# Patient Record
Sex: Female | Born: 1950 | Race: White | Hispanic: No | Smoking: Former smoker
Health system: Southern US, Community
[De-identification: ages and names within clinical notes are randomized; demographics above are authoritative.]

## PROBLEM LIST (undated history)

## (undated) DIAGNOSIS — I10 Essential (primary) hypertension: Secondary | ICD-10-CM

## (undated) DIAGNOSIS — I639 Cerebral infarction, unspecified: Secondary | ICD-10-CM

## (undated) DIAGNOSIS — E119 Type 2 diabetes mellitus without complications: Secondary | ICD-10-CM

## (undated) DIAGNOSIS — M545 Low back pain, unspecified: Secondary | ICD-10-CM

## (undated) DIAGNOSIS — E039 Hypothyroidism, unspecified: Secondary | ICD-10-CM

## (undated) DIAGNOSIS — M7070 Other bursitis of hip, unspecified hip: Secondary | ICD-10-CM

## (undated) DIAGNOSIS — E559 Vitamin D deficiency, unspecified: Secondary | ICD-10-CM

## (undated) DIAGNOSIS — E785 Hyperlipidemia, unspecified: Secondary | ICD-10-CM

## (undated) HISTORY — DX: Other bursitis of hip, unspecified hip: M70.70

## (undated) HISTORY — DX: Hypothyroidism, unspecified: E03.9

## (undated) HISTORY — DX: Vitamin D deficiency, unspecified: E55.9

## (undated) HISTORY — DX: Low back pain, unspecified: M54.50

## (undated) HISTORY — PX: CHOLECYSTECTOMY: SHX55

## (undated) HISTORY — DX: Essential (primary) hypertension: I10

## (undated) HISTORY — PX: LEG SURGERY: SHX1003

## (undated) HISTORY — DX: Type 2 diabetes mellitus without complications: E11.9

## (undated) HISTORY — DX: Cerebral infarction, unspecified: I63.9

## (undated) HISTORY — DX: Hyperlipidemia, unspecified: E78.5

## (undated) HISTORY — PX: APPENDECTOMY: SHX54

## (undated) HISTORY — DX: Low back pain: M54.5

---

## 1994-03-13 HISTORY — PX: BACK SURGERY: SHX140

## 2005-03-30 ENCOUNTER — Ambulatory Visit: Payer: Self-pay | Admitting: Family Medicine

## 2011-03-08 ENCOUNTER — Ambulatory Visit: Payer: Self-pay | Admitting: Physical Therapy

## 2011-03-08 ENCOUNTER — Ambulatory Visit: Payer: BC Managed Care – PPO | Attending: Family Medicine | Admitting: Physical Therapy

## 2011-03-08 DIAGNOSIS — M546 Pain in thoracic spine: Secondary | ICD-10-CM | POA: Insufficient documentation

## 2011-03-08 DIAGNOSIS — R293 Abnormal posture: Secondary | ICD-10-CM | POA: Insufficient documentation

## 2011-03-08 DIAGNOSIS — M545 Low back pain, unspecified: Secondary | ICD-10-CM | POA: Insufficient documentation

## 2011-03-08 DIAGNOSIS — M2560 Stiffness of unspecified joint, not elsewhere classified: Secondary | ICD-10-CM | POA: Insufficient documentation

## 2011-03-08 DIAGNOSIS — IMO0001 Reserved for inherently not codable concepts without codable children: Secondary | ICD-10-CM | POA: Insufficient documentation

## 2011-03-09 ENCOUNTER — Ambulatory Visit: Payer: BC Managed Care – PPO | Admitting: Physical Therapy

## 2011-03-13 ENCOUNTER — Ambulatory Visit: Payer: BC Managed Care – PPO | Admitting: Physical Therapy

## 2011-04-12 DIAGNOSIS — Z029 Encounter for administrative examinations, unspecified: Secondary | ICD-10-CM

## 2011-08-29 ENCOUNTER — Encounter: Payer: Self-pay | Admitting: Internal Medicine

## 2012-05-20 ENCOUNTER — Encounter: Payer: Self-pay | Admitting: Family Medicine

## 2012-05-29 ENCOUNTER — Telehealth: Payer: Self-pay | Admitting: Pharmacist

## 2012-05-29 ENCOUNTER — Encounter: Payer: Self-pay | Admitting: Family Medicine

## 2012-05-29 ENCOUNTER — Ambulatory Visit (INDEPENDENT_AMBULATORY_CARE_PROVIDER_SITE_OTHER): Payer: BC Managed Care – PPO | Admitting: Family Medicine

## 2012-05-29 VITALS — BP 153/78 | HR 79 | Temp 98.0°F | Ht 66.0 in | Wt 214.6 lb

## 2012-05-29 DIAGNOSIS — E119 Type 2 diabetes mellitus without complications: Secondary | ICD-10-CM

## 2012-05-29 DIAGNOSIS — F32A Depression, unspecified: Secondary | ICD-10-CM

## 2012-05-29 DIAGNOSIS — E559 Vitamin D deficiency, unspecified: Secondary | ICD-10-CM

## 2012-05-29 DIAGNOSIS — M25512 Pain in left shoulder: Secondary | ICD-10-CM

## 2012-05-29 DIAGNOSIS — F3289 Other specified depressive episodes: Secondary | ICD-10-CM

## 2012-05-29 DIAGNOSIS — E785 Hyperlipidemia, unspecified: Secondary | ICD-10-CM

## 2012-05-29 DIAGNOSIS — M25519 Pain in unspecified shoulder: Secondary | ICD-10-CM

## 2012-05-29 DIAGNOSIS — R21 Rash and other nonspecific skin eruption: Secondary | ICD-10-CM

## 2012-05-29 DIAGNOSIS — I10 Essential (primary) hypertension: Secondary | ICD-10-CM

## 2012-05-29 DIAGNOSIS — F329 Major depressive disorder, single episode, unspecified: Secondary | ICD-10-CM

## 2012-05-29 LAB — BASIC METABOLIC PANEL
BUN: 14 mg/dL (ref 6–23)
CO2: 27 mEq/L (ref 19–32)
Calcium: 10.4 mg/dL (ref 8.4–10.5)
Chloride: 100 mEq/L (ref 96–112)
Creat: 0.79 mg/dL (ref 0.50–1.10)
Glucose, Bld: 104 mg/dL — ABNORMAL HIGH (ref 70–99)
Potassium: 4.2 mEq/L (ref 3.5–5.3)
Sodium: 139 mEq/L (ref 135–145)

## 2012-05-29 MED ORDER — GLUCOSE BLOOD VI STRP: ORAL_STRIP | Status: DC

## 2012-05-29 MED ORDER — BLOOD GLUCOSE MONITORING SUPPL DEVI: Status: DC

## 2012-05-29 MED ORDER — MUPIROCIN 2 % EX OINT: TOPICAL_OINTMENT | Freq: Three times a day (TID) | CUTANEOUS | Status: AC

## 2012-05-29 MED ORDER — LOSARTAN POTASSIUM-HCTZ 100-25 MG PO TABS: 1.0000 | ORAL_TABLET | Freq: Every day | ORAL | Status: DC

## 2012-05-29 MED ORDER — PRAVASTATIN SODIUM 20 MG PO TABS: 20.0000 mg | ORAL_TABLET | Freq: Every day | ORAL | Status: DC

## 2012-05-29 MED ORDER — CITALOPRAM HYDROBROMIDE 20 MG PO TABS: 20.0000 mg | ORAL_TABLET | Freq: Every day | ORAL | Status: DC

## 2012-05-29 MED ORDER — AMLODIPINE BESYLATE 2.5 MG PO TABS: 2.5000 mg | ORAL_TABLET | Freq: Every day | ORAL | Status: DC

## 2012-05-29 NOTE — Telephone Encounter (Signed)
rx written and given to patient for one touch ultra test strips.  Documented in medications

## 2012-05-29 NOTE — Progress Notes (Signed)
Subjective:     Patient ID: Laura Dalton, female   DOB: [birth year], 62 y.o.   MRN: [id number]  HPI Patient comes in for follow up of her medical problems. Diabetes: Her blood sugars are now running 120s in the morning she has not had an appointment yet with the pharmacist for diabetic teaching. No problems with the metformin which was prescribed for her. No nocturia no polyuria no blurred vision.  Hypertension: No headache chest pain or palpitations. I change her prescription to combine losartan HCTZ.  Left shoulder pain: Her left shoulder continues to hurt her at the left a.c. joint area she cannot reach back and scratch her back. She doesn't think they've meloxicam is very effective. She has chronic back pain. No new weakness or numbness in the left upper extremity.  Because of her health issues and now recognizing that she is diabetic. She is experiencing symptoms of depression low energy. She does loss of motivation to do anything. She knows she was on Cymbalta before and that helped her with pain and her depression. However the cost of the Cymbalta was so high. She has not refilled the Cymbalta true prefer to try a different generic antidepressant.  Hyperlipidemia: She was always on pravastatin 10 mg daily. This was not known to [place] her last visit. She had not picked up her prescription for atorvastatin 20 mg. Nightly rather stay with her pravastatin and just increase the dose instead.  She does have some bumps in the left eyebrow. And also some bumps on the nose. She does not think it is lupus but she does have a close family member who has lupus. These bumps have been recent and she has applied hydrocortisone over-the-counter ointment to it and it has not changed the rash. Past Medical History  Diagnosis Date  . Bursitis of hip   . Low back pain     benign turmor back  . COPD (chronic obstructive pulmonary disease)   . Hypothyroidism   . Vitamin D deficiency disease   . Hypertension    . Diabetes mellitus without complication     type II   Past Surgical History  Procedure Laterality Date  . Back surgery  1996    turmor on back   . Appendectomy    . Cholecystectomy    . Leg surgery Right     "clot and had drain in leg  rt lower leg   History   Social History  . Marital Status: Married    Spouse Name: N/A    Number of Children: N/A  . Years of Education: N/A   Occupational History  . 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 Smoking status: Former Smoker    Types: Cigarettes    Quit date: 12/01/2007  . Smokeless tobacco: Not on file  . Alcohol Use: No  . Drug Use: No  . Sexually Active: Not on file   Other Topics Concern  . Not on file   Social History Narrative  . No narrative on file   Family History  Problem Relation Age of Onset  . Diabetes Mother   . Cancer Father    Current Outpatient Prescriptions on File Prior to Visit  Medication Sig Dispense Refill  . amitriptyline (ELAVIL) 25 MG tablet Take 100 mg by mouth at bedtime.      . fish oil-omega-3 fatty acids 1000 MG capsule Take 2 g by mouth daily.      Marland Kitchen  Fluticasone-Salmeterol (ADVAIR DISKUS) 250-50 MCG/DOSE AEPB Inhale 1 puff into the lungs once.      . hydrochlorothiazide (HYDRODIURIL) 25 MG tablet Take 12.5 mg by mouth daily.      Marland Kitchen losartan (COZAAR) 100 MG tablet Take 100 mg by mouth daily.      . meloxicam (MOBIC) 15 MG tablet Take 15 mg by mouth daily.      . metFORMIN (GLUCOPHAGE) 500 MG tablet Take 500 mg by mouth 2 (two) times daily with a meal.      . pravastatin (PRAVACHOL) 10 MG tablet Take 10 mg by mouth daily.      Marland Kitchen gabapentin (NEURONTIN) 600 MG tablet Take 600 mg by mouth 3 (three) times daily.      . hydrOXYzine (ATARAX/VISTARIL) 25 MG tablet Take 25 mg by mouth 3 (three) times daily as needed.      Marland Kitchen LORazepam (ATIVAN) 0.5 MG tablet Take 0.5 mg by mouth every 8 (eight) hours.       No current facility-administered medications on file prior to visit.    Allergies  Allergen Reactions  . Aspirin Hives   Immunization History  Administered Date(s) Administered  . Influenza Whole 03/13/2010  . Tdap 03/13/2010   Prior to Admission medications   Medication Sig Start Date End Date Taking? Authorizing Provider  amitriptyline (ELAVIL) 25 MG tablet Take 100 mg by mouth at bedtime.   Yes Historical Provider, MD  fish oil-omega-3 fatty acids 1000 MG capsule Take 2 g by mouth daily.   Yes Historical Provider, MD  Fluticasone-Salmeterol (ADVAIR DISKUS) 250-50 MCG/DOSE AEPB Inhale 1 puff into the lungs once.   Yes Historical Provider, MD  hydrochlorothiazide (HYDRODIURIL) 25 MG tablet Take 12.5 mg by mouth daily.   Yes Historical Provider, MD  losartan (COZAAR) 100 MG tablet Take 100 mg by mouth daily.   Yes Historical Provider, MD  meloxicam (MOBIC) 15 MG tablet Take 15 mg by mouth daily.   Yes Historical Provider, MD  metFORMIN (GLUCOPHAGE) 500 MG tablet Take 500 mg by mouth 2 (two) times daily with a meal.   Yes Historical Provider, MD  pravastatin (PRAVACHOL) 10 MG tablet Take 10 mg by mouth daily.   Yes Historical Provider, MD  gabapentin (NEURONTIN) 600 MG tablet Take 600 mg by mouth 3 (three) times daily.    Historical Provider, MD  hydrOXYzine (ATARAX/VISTARIL) 25 MG tablet Take 25 mg by mouth 3 (three) times daily as needed.    Historical Provider, MD  LORazepam (ATIVAN) 0.5 MG tablet Take 0.5 mg by mouth every 8 (eight) hours.    Historical Provider, MD      Review of Systems  All other systems reviewed and are negative.       Objective:   Physical Exam BP 153/78  Pulse 79  Temp(Src) 98 F (36.7 C) (Oral)  Ht 5\' 6"  (1.676 m)  Wt 214 lb 9.6 oz (97.342 kg)  BMI 34.65 kg/m2  SpO2 97% Recheck in her blood pressure it was 150/80. Appearance: The patient appeared well nourished and normally developed. In no acute distress.   Vital signs:  As documented.   Head exam:  Unremarkable except for the rash on the left eyebrow and  on the nose. This rash is characterized by tiny red papules at the base of the hair follicles of the eyebrows. No scleral icterus or corneal arcus noted. No purulence noted there is slight redness of the cheeks   Neck: Without jugular venous distension, thyromegaly, or carotid bruits. Carotid  upstrokes are brisk bilaterally.  Chest & Lungs: The chest wall is normal. The lungs are clear to auscultation and percussion.  Cardiac exam: Reveals the PMI to be normally sized and situated. Rhythm is regular. First and second heart sounds normal. No murmurs, rubs or gallops.  Abdominal exam: Obese soft abdomen Reveals normal bowel sounds, No masses, no organomegaly and no aortic enlargement.  No guarding no rebound. No Bruits.  GU:  Extremities: nonedematous. Both femoral and pedal pulses are normal. Other peripheral pulses are normal. Joints: The left shoulder reveals a reduction in range of motion due to pain palpable pain reproduced in the left acromioclavicular joint. Internal and external rotation produces pain in this area. Her spine has reduction in range of motion. This is accompanied by pain on trying to lie down and sit up  Neurologic: Oriented to time place and person.  Strength is normal. Sensory is normal. Reflexes are normal. Cranial Nerves are normal.  Sensory exam: monofilament exam was normal and intact.  Skin exam:  Normal without Tattoos.     Assessment:       1 papular rash of the face, suspect this is a folliculitis. 2 diabetes mellitus recently started on metformin. No adverse effect of metformin. 3 hypertension: No headache chest pain or palpitations. Her blood pressure is not at goal. 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 5 chronic back pain. 6 history of vitamin D deficiency. 7 depression   Plan:     Laboratories today a knee to rule out lupus. We'll start her on citalopram for her depression. Would treat her  with Bactroban ointment 3 times a day to the rash for 7 days. We'll check a BMP, vitamin D. We'll refer her to orthopedics for evaluation and treatment of the left shoulder pain     Orders Placed This Encounter  Procedures  . ANA  . Basic Metabolic Panel  . Vitamin D, 25-hydroxy  . Ambulatory referral to Orthopedic Surgery    Referral Priority:  Routine    Referral Type:  Surgical    Referral Reason:  Specialty Services Required    Requested Specialty:  Orthopedic Surgery    Number of Visits Requested:  1   Tien Aispuro P. [place] Charon, M.D.

## 2012-05-30 LAB — ANA: Anti Nuclear Antibody(ANA): NEGATIVE

## 2012-05-30 LAB — VITAMIN D 25 HYDROXY (VIT D DEFICIENCY, FRACTURES): Vit D, 25-Hydroxy: 37 ng/mL (ref 30–89)

## 2012-05-30 NOTE — Progress Notes (Signed)
Quick Note:  Call patient. Labs normal. Patient is diabetic, sugar is good No change in plan. ______

## 2012-07-30 ENCOUNTER — Ambulatory Visit: Payer: BC Managed Care – PPO | Admitting: Family Medicine

## 2012-08-27 ENCOUNTER — Other Ambulatory Visit: Payer: Self-pay | Admitting: Family Medicine

## 2012-09-01 ENCOUNTER — Other Ambulatory Visit: Payer: Self-pay | Admitting: Family Medicine

## 2012-09-07 ENCOUNTER — Other Ambulatory Visit: Payer: Self-pay | Admitting: Family Medicine

## 2012-09-17 ENCOUNTER — Other Ambulatory Visit: Payer: Self-pay | Admitting: Family Medicine

## 2012-09-18 NOTE — Telephone Encounter (Signed)
SEE LAST OV. STATES PT ON PRAVASTATIN AND WANTED TO STAY ON AND INCREASE DOSE INSTEAD OF STARTING LIPITOR. NOW FILLED LIPITOR ON 08/17/12. PLEASE REVIEW. THANKS.

## 2012-09-20 ENCOUNTER — Other Ambulatory Visit: Payer: Self-pay | Admitting: Family Medicine

## 2012-10-01 ENCOUNTER — Encounter: Payer: Self-pay | Admitting: Family Medicine

## 2012-10-01 ENCOUNTER — Ambulatory Visit (INDEPENDENT_AMBULATORY_CARE_PROVIDER_SITE_OTHER): Payer: BC Managed Care – PPO | Admitting: Family Medicine

## 2012-10-01 VITALS — BP 127/72 | HR 76 | Temp 98.4°F | Ht 66.0 in | Wt 206.0 lb

## 2012-10-01 DIAGNOSIS — B351 Tinea unguium: Secondary | ICD-10-CM

## 2012-10-01 DIAGNOSIS — E785 Hyperlipidemia, unspecified: Secondary | ICD-10-CM

## 2012-10-01 DIAGNOSIS — F329 Major depressive disorder, single episode, unspecified: Secondary | ICD-10-CM

## 2012-10-01 DIAGNOSIS — I10 Essential (primary) hypertension: Secondary | ICD-10-CM

## 2012-10-01 DIAGNOSIS — E1169 Type 2 diabetes mellitus with other specified complication: Secondary | ICD-10-CM | POA: Insufficient documentation

## 2012-10-01 DIAGNOSIS — I152 Hypertension secondary to endocrine disorders: Secondary | ICD-10-CM | POA: Insufficient documentation

## 2012-10-01 DIAGNOSIS — F3289 Other specified depressive episodes: Secondary | ICD-10-CM

## 2012-10-01 DIAGNOSIS — G589 Mononeuropathy, unspecified: Secondary | ICD-10-CM

## 2012-10-01 DIAGNOSIS — G8929 Other chronic pain: Secondary | ICD-10-CM

## 2012-10-01 DIAGNOSIS — E782 Mixed hyperlipidemia: Secondary | ICD-10-CM | POA: Insufficient documentation

## 2012-10-01 DIAGNOSIS — E1159 Type 2 diabetes mellitus with other circulatory complications: Secondary | ICD-10-CM | POA: Insufficient documentation

## 2012-10-01 DIAGNOSIS — M549 Dorsalgia, unspecified: Secondary | ICD-10-CM

## 2012-10-01 DIAGNOSIS — J441 Chronic obstructive pulmonary disease with (acute) exacerbation: Secondary | ICD-10-CM | POA: Insufficient documentation

## 2012-10-01 DIAGNOSIS — G629 Polyneuropathy, unspecified: Secondary | ICD-10-CM

## 2012-10-01 DIAGNOSIS — F32A Depression, unspecified: Secondary | ICD-10-CM

## 2012-10-01 DIAGNOSIS — J449 Chronic obstructive pulmonary disease, unspecified: Secondary | ICD-10-CM | POA: Insufficient documentation

## 2012-10-01 DIAGNOSIS — E039 Hypothyroidism, unspecified: Secondary | ICD-10-CM

## 2012-10-01 DIAGNOSIS — E119 Type 2 diabetes mellitus without complications: Secondary | ICD-10-CM

## 2012-10-01 DIAGNOSIS — J4489 Other specified chronic obstructive pulmonary disease: Secondary | ICD-10-CM

## 2012-10-01 DIAGNOSIS — E559 Vitamin D deficiency, unspecified: Secondary | ICD-10-CM

## 2012-10-01 LAB — COMPLETE METABOLIC PANEL WITH GFR
ALT: 20 U/L (ref 0–35)
AST: 36 U/L (ref 0–37)
Albumin: 4.8 g/dL (ref 3.5–5.2)
Alkaline Phosphatase: 55 U/L (ref 39–117)
BUN: 11 mg/dL (ref 6–23)
CO2: 26 mEq/L (ref 19–32)
Calcium: 10.6 mg/dL — ABNORMAL HIGH (ref 8.4–10.5)
Chloride: 95 mEq/L — ABNORMAL LOW (ref 96–112)
Creat: 0.8 mg/dL (ref 0.50–1.10)
GFR, Est African American: >89 mL/min
GFR, Est Non African American: 79 mL/min
Glucose, Bld: 94 mg/dL (ref 70–99)
Potassium: 3.8 mEq/L (ref 3.5–5.3)
Sodium: 138 mEq/L (ref 135–145)
Total Bilirubin: 0.4 mg/dL (ref 0.3–1.2)
Total Protein: 7.4 g/dL (ref 6.0–8.3)

## 2012-10-01 LAB — POCT GLYCOSYLATED HEMOGLOBIN (HGB A1C): Hemoglobin A1C: 6

## 2012-10-01 LAB — POCT UA - MICROALBUMIN: Microalbumin Ur, POC: NEGATIVE mg/L

## 2012-10-01 MED ORDER — LOSARTAN POTASSIUM-HCTZ 100-25 MG PO TABS: 1.0000 | ORAL_TABLET | Freq: Every day | ORAL | Status: DC

## 2012-10-01 MED ORDER — CITALOPRAM HYDROBROMIDE 20 MG PO TABS: ORAL_TABLET | ORAL | Status: DC

## 2012-10-01 MED ORDER — MELOXICAM 15 MG PO TABS: 15.0000 mg | ORAL_TABLET | Freq: Every day | ORAL | Status: DC

## 2012-10-01 MED ORDER — AMLODIPINE BESYLATE 2.5 MG PO TABS: ORAL_TABLET | ORAL | Status: DC

## 2012-10-01 MED ORDER — ATORVASTATIN CALCIUM 20 MG PO TABS: ORAL_TABLET | ORAL | Status: DC

## 2012-10-01 MED ORDER — METFORMIN HCL 500 MG PO TABS: ORAL_TABLET | ORAL | Status: DC

## 2012-10-01 NOTE — Progress Notes (Signed)
Patient ID: Laura Dalton, female   DOB: 11-Jul-1950, 62 y.o.   MRN: [id number] SUBJECTIVE: CC: Chief Complaint  Patient presents with  . Medication Refill    needs refill  and wants 90 day wants lorazepam refilled   HPI: Patient not sure about her medications. Lorazepam has risks and not Rx in a long time. Patient is here for follow up of hypertension: denies Headache;deniesChest Pain;denies weakness;denies Shortness of Breath or Orthopnea;denies Visual changes;denies palpitations;denies cough;denies pedal edema;denies symptoms of TIA or stroke; admits to Compliance with medications.but patient not sure about her meds. denies Problems with medications. Patient is here for follow up of Diabetes Mellitus: Symptoms of DM: Denies Nocturia ,Denies Urinary Frequency , denies Blurred vision ,deniesDizziness,denies.Dysuria,denies paresthesias, denies extremity pain or ulcers.Marland Kitchendenies chest pain. has had an annual eye exam. do check the feet. Does check CBGs. Average CBG: better Denies episodes of hypoglycemia. Does have an emergency hypoglycemic plan.  Other problems are stable. . Past Medical History  Diagnosis Date  . Bursitis of hip   . Low back pain     benign turmor back  . COPD (chronic obstructive pulmonary disease)   . Hypothyroidism   . Vitamin D deficiency disease   . Hypertension   . Diabetes mellitus without complication     type II   Past Surgical History  Procedure Laterality Date  . Back surgery  1996    turmor on back   . Appendectomy    . Cholecystectomy    . Leg surgery Right     "clot and had drain in leg  rt lower leg   History   Social History  . Marital Status: Married    Spouse Name: N/A    Number of Children: N/A  . Years of Education: N/A   Occupational History  . 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 Smoking status: Former Smoker    Types: Cigarettes    Quit date: 12/01/2007  . Smokeless tobacco: Not on file  . Alcohol  Use: No  . Drug Use: No  . Sexually Active: Not on file   Other Topics Concern  . Not on file   Social History Narrative  . No narrative on file   Family History  Problem Relation Age of Onset  . Diabetes Mother   . Cancer Father    Current Outpatient Prescriptions on File Prior to Visit  Medication Sig Dispense Refill  . amitriptyline (ELAVIL) 25 MG tablet Take 100 mg by mouth at bedtime.      . fish oil-omega-3 fatty acids 1000 MG capsule Take 2 g by mouth daily.      . Fluticasone-Salmeterol (ADVAIR DISKUS) 250-50 MCG/DOSE AEPB Inhale 1 puff into the lungs once.      . gabapentin (NEURONTIN) 600 MG tablet Take 600 mg by mouth 3 (three) times daily.      Marland Kitchen glucose blood test strip One Touch Ultra Test Strips Use to check blood glucose once a day Dx:  250.00  50 each  2  . hydrOXYzine (ATARAX/VISTARIL) 25 MG tablet Take 25 mg by mouth 3 (three) times daily as needed.       No current facility-administered medications on file prior to visit.   Allergies  Allergen Reactions  . Aspirin Hives   Immunization History  Administered Date(s) Administered  . Influenza Whole 03/13/2010  . Tdap 03/13/2010   Prior to Admission medications   Medication Sig Start Date End Date  Taking? Authorizing Provider  amitriptyline (ELAVIL) 25 MG tablet Take 100 mg by mouth at bedtime.   Yes Historical Provider, MD  amLODipine (NORVASC) 2.5 MG tablet TAKE ONE TABLET BY MOUTH ONCE DAILY 10/01/12  Yes Ileana Ladd, MD  citalopram (CELEXA) 20 MG tablet TAKE ONE TABLET BY MOUTH ONCE DAILY 10/01/12  Yes Ileana Ladd, MD  fish oil-omega-3 fatty acids 1000 MG capsule Take 2 g by mouth daily.   Yes Historical Provider, MD  Fluticasone-Salmeterol (ADVAIR DISKUS) 250-50 MCG/DOSE AEPB Inhale 1 puff into the lungs once.   Yes Historical Provider, MD  gabapentin (NEURONTIN) 600 MG tablet Take 600 mg by mouth 3 (three) times daily.   Yes Historical Provider, MD  glucose blood test strip One Touch Ultra Test  Strips Use to check blood glucose once a day Dx:  250.00 05/29/12  Yes Ileana Ladd, MD  losartan-hydrochlorothiazide (HYZAAR) 100-25 MG per tablet Take 1 tablet by mouth daily. 10/01/12  Yes Ileana Ladd, MD  meloxicam (MOBIC) 15 MG tablet Take 1 tablet (15 mg total) by mouth daily. 10/01/12  Yes Ileana Ladd, MD  metFORMIN (GLUCOPHAGE) 500 MG tablet TAKE ONE TABLET BY MOUTH TWICE DAILY 10/01/12  Yes Ileana Ladd, MD  atorvastatin (LIPITOR) 20 MG tablet TAKE ONE TABLET BY MOUTH AT BEDTIME 10/01/12   Ileana Ladd, MD  hydrOXYzine (ATARAX/VISTARIL) 25 MG tablet Take 25 mg by mouth 3 (three) times daily as needed.    Historical Provider, MD     ROS: As above in the HPI. All other systems are stable or negative.  OBJECTIVE: APPEARANCE:  Patient in no acute distress.The patient appeared well nourished and normally developed. Acyanotic. Waist: VITAL SIGNS:BP 127/72  Pulse 76  Temp(Src) 98.4 F (36.9 C) (Oral)  Ht 5\' 6"  (1.676 m)  Wt 206 lb (93.441 kg)  BMI 33.27 kg/m2 WF obese  SKIN: warm and  Dry without overt rashes, tattoos and scars  HEAD and Neck: without JVD, Head and scalp: normal Eyes:No scleral icterus. Fundi normal, eye movements normal. Ears: Auricle normal, canal normal, Tympanic membranes normal, insufflation normal. Nose: normal Throat: normal Neck & thyroid: normal  CHEST & LUNGS: Chest wall: normal Lungs: Clear  CVS: Reveals the PMI to be normally located. Regular rhythm, First and Second Heart sounds are normal,  absence of murmurs, rubs or gallops. Peripheral vasculature: Radial pulses: normal Dorsal pedis pulses: normal Posterior pulses: normal  ABDOMEN:  Appearance: obese Benign, no organomegaly, no masses, no Abdominal Aortic enlargement. No Guarding , no rebound. No Bruits. Bowel sounds: normal  RECTAL: N/A GU: N/A  EXTREMETIES: nonedematous. Both Femoral and Pedal pulses are normal.  MUSCULOSKELETAL:  Spine: normal Joints:  intact  NEUROLOGIC: oriented to time,place and person; nonfocal. Strength is normal Sensory is normal Reflexes are normal Cranial Nerves are normal.  ASSESSMENT: COPD (chronic obstructive pulmonary disease)  Chronic back pain - Plan: meloxicam (MOBIC) 15 MG tablet  Unspecified hypothyroidism - Plan: TSH  HTN (hypertension) - Plan: amLODipine (NORVASC) 2.5 MG tablet, losartan-hydrochlorothiazide (HYZAAR) 100-25 MG per tablet, COMPLETE METABOLIC PANEL WITH GFR  DM (diabetes mellitus) - Plan: metFORMIN (GLUCOPHAGE) 500 MG tablet, POCT glycosylated hemoglobin (Hb A1C), POCT UA - Microalbumin, COMPLETE METABOLIC PANEL WITH GFR  Unspecified vitamin D deficiency - Plan: Vitamin D 25 hydroxy  HLD (hyperlipidemia) - Plan: atorvastatin (LIPITOR) 20 MG tablet, COMPLETE METABOLIC PANEL WITH GFR, NMR Lipoprofile with Lipids  Tinea unguium  Depression - Plan: citalopram (CELEXA) 20 MG tablet  Neuropathy -  Plan: Vitamin B12, Folate  PLAN: Orders Placed This Encounter  Procedures  . COMPLETE METABOLIC PANEL WITH GFR  . NMR Lipoprofile with Lipids  . TSH  . Vitamin D 25 hydroxy  . Vitamin B12  . Folate  . POCT glycosylated hemoglobin (Hb A1C)  . POCT UA - Microalbumin   Meds ordered this encounter  Medications  . citalopram (CELEXA) 20 MG tablet    Sig: TAKE ONE TABLET BY MOUTH ONCE DAILY    Dispense:  90 tablet    Refill:  3  . metFORMIN (GLUCOPHAGE) 500 MG tablet    Sig: TAKE ONE TABLET BY MOUTH TWICE DAILY    Dispense:  180 tablet    Refill:  3  . meloxicam (MOBIC) 15 MG tablet    Sig: Take 1 tablet (15 mg total) by mouth daily.    Dispense:  90 tablet    Refill:  1  . amLODipine (NORVASC) 2.5 MG tablet    Sig: TAKE ONE TABLET BY MOUTH ONCE DAILY    Dispense:  90 tablet    Refill:  3  . losartan-hydrochlorothiazide (HYZAAR) 100-25 MG per tablet    Sig: Take 1 tablet by mouth daily.    Dispense:  90 tablet    Refill:  3  . atorvastatin (LIPITOR) 20 MG tablet    Sig:  TAKE ONE TABLET BY MOUTH AT BEDTIME    Dispense:  90 tablet    Refill:  3   Results for orders placed in visit on 10/01/12  POCT GLYCOSYLATED HEMOGLOBIN (HGB A1C)      Result Value Range   Hemoglobin A1C 6.0%    POCT UA - MICROALBUMIN      Result Value Range   Microalbumin Ur, POC negative     Diet exercise and weight loss.  Discussed topical Treatment for tinea unguium.  Return in about 3 months (around 01/01/2013) for Recheck medical problems.  Skylin Kennerson P. [place] Charon, M.D.

## 2012-10-02 LAB — NMR LIPOPROFILE WITH LIPIDS
Cholesterol, Total: 142 mg/dL (ref <200)
HDL Particle Number: 32.2 umol/L (ref >=30.5)
HDL Size: 9.5 nm (ref >=9.2)
HDL-C: 38 mg/dL — ABNORMAL LOW (ref >=40)
LDL (calc): 73 mg/dL (ref <100)
LDL Particle Number: 1068 nmol/L — ABNORMAL HIGH (ref <1000)
LDL Size: 19.9 nm — ABNORMAL LOW (ref >20.5)
LP-IR Score: 56 — ABNORMAL HIGH (ref <=45)
Large HDL-P: 5.1 umol/L (ref >=4.8)
Large VLDL-P: 6.2 nmol/L — ABNORMAL HIGH (ref <=2.7)
Small LDL Particle Number: 794 nmol/L — ABNORMAL HIGH (ref <=527)
Triglycerides: 156 mg/dL — ABNORMAL HIGH (ref <150)
VLDL Size: 50.7 nm — ABNORMAL HIGH (ref <=46.6)

## 2012-10-02 LAB — TSH: TSH: 4.226 u[IU]/mL (ref 0.350–4.500)

## 2012-10-02 LAB — FOLATE: Folate: 17.8 ng/mL

## 2012-10-02 LAB — VITAMIN B12: Vitamin B-12: 422 pg/mL (ref 211–911)

## 2012-10-02 LAB — VITAMIN D 25 HYDROXY (VIT D DEFICIENCY, FRACTURES): Vit D, 25-Hydroxy: 40 ng/mL (ref 30–89)

## 2012-10-02 NOTE — Progress Notes (Signed)
Quick Note:  Lab result at goal or close to goal No change in Medications for now. No Change in plans and follow up. ______

## 2012-10-08 ENCOUNTER — Other Ambulatory Visit: Payer: Self-pay | Admitting: Family Medicine

## 2012-10-14 ENCOUNTER — Encounter: Payer: Self-pay | Admitting: *Deleted

## 2013-01-01 ENCOUNTER — Ambulatory Visit: Payer: BC Managed Care – PPO | Admitting: Family Medicine

## 2013-01-06 ENCOUNTER — Encounter: Payer: Self-pay | Admitting: Family Medicine

## 2013-01-06 ENCOUNTER — Ambulatory Visit (INDEPENDENT_AMBULATORY_CARE_PROVIDER_SITE_OTHER): Payer: BC Managed Care – PPO | Admitting: Family Medicine

## 2013-01-06 VITALS — BP 132/75 | HR 70 | Temp 98.4°F | Ht 66.0 in | Wt 191.4 lb

## 2013-01-06 DIAGNOSIS — E559 Vitamin D deficiency, unspecified: Secondary | ICD-10-CM

## 2013-01-06 DIAGNOSIS — M549 Dorsalgia, unspecified: Secondary | ICD-10-CM

## 2013-01-06 DIAGNOSIS — G8929 Other chronic pain: Secondary | ICD-10-CM

## 2013-01-06 DIAGNOSIS — I10 Essential (primary) hypertension: Secondary | ICD-10-CM

## 2013-01-06 DIAGNOSIS — R42 Dizziness and giddiness: Secondary | ICD-10-CM | POA: Insufficient documentation

## 2013-01-06 DIAGNOSIS — J449 Chronic obstructive pulmonary disease, unspecified: Secondary | ICD-10-CM

## 2013-01-06 DIAGNOSIS — E785 Hyperlipidemia, unspecified: Secondary | ICD-10-CM

## 2013-01-06 DIAGNOSIS — E039 Hypothyroidism, unspecified: Secondary | ICD-10-CM

## 2013-01-06 DIAGNOSIS — E119 Type 2 diabetes mellitus without complications: Secondary | ICD-10-CM

## 2013-01-06 LAB — POCT CBC
Granulocyte percent: 68.1 %G (ref 37–80)
HCT, POC: 43.4 % (ref 37.7–47.9)
Hemoglobin: 14 g/dL (ref 12.2–16.2)
Lymph, poc: 2.7 (ref 0.6–3.4)
MCH, POC: 27.1 pg (ref 27–31.2)
MCHC: 32.4 g/dL (ref 31.8–35.4)
MCV: 83.7 fL (ref 80–97)
MPV: 8.1 fL (ref 0–99.8)
POC Granulocyte: 6.8 (ref 2–6.9)
POC LYMPH PERCENT: 27.2 %L (ref 10–50)
Platelet Count, POC: 303 10*3/uL (ref 142–424)
RBC: 5.2 M/uL (ref 4.04–5.48)
RDW, POC: 13.8 %
WBC: 10 10*3/uL (ref 4.6–10.2)

## 2013-01-06 LAB — POCT GLYCOSYLATED HEMOGLOBIN (HGB A1C): Hemoglobin A1C: 5.6

## 2013-01-06 NOTE — Progress Notes (Signed)
Patient ID: Laura Dalton, female   DOB: 25-Nov-1950, 62 y.o.   MRN: [id number] SUBJECTIVE: CC: Chief Complaint  Patient presents with  . Follow-up    3 month follow up c/o dizzy when movews head to much when standing gets unstable   . Medication Refill    atorvastatin gabapentin     HPI:  Last week was feeling fine. Sat up in the bed was dizzy and sweating. No chest pain. Had a shower. Went to church, 5 people said they were dizzy. So she thought it was a virus. No cough, no body aches, no SOB. Her sugar was 74 at that time. Changing position she stumbles around. Felt like she was drunk. Ate some preserves and there was no changes.  Patient is here for follow up of Diabetes MellitusHTN/HLD/Chronic back pain.: Symptoms evaluated: Denies Nocturia ,Denies Urinary Frequency , denies Blurred vision ,deniesDizziness,denies.Dysuria,denies paresthesias, denies extremity pain or ulcers.Marland Kitchendenies chest pain. has had an annual eye exam. do check the feet. Does check CBGs. Average CBG: 113-121 Denies episodes of hypoglycemia. Does have an emergency hypoglycemic plan. admits to Compliance with medications. Denies Problems with medications.  Past Medical History  Diagnosis Date  . Bursitis of hip   . Low back pain     benign turmor back  . COPD (chronic obstructive pulmonary disease)   . Hypothyroidism   . Vitamin D deficiency disease   . Hypertension   . Diabetes mellitus without complication     type II   Past Surgical History  Procedure Laterality Date  . Back surgery  1996    turmor on back   . Appendectomy    . Cholecystectomy    . Leg surgery Right     "clot and had drain in leg  rt lower leg   History   Social History  . Marital Status: Married    Spouse Name: N/A    Number of Children: N/A  . Years of Education: N/A   Occupational History  . 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 Smoking status: Former Smoker    Types: Cigarettes    Quit date:  12/01/2007  . Smokeless tobacco: Not on file  . Alcohol Use: No  . Drug Use: No  . Sexual Activity: Not on file   Other Topics Concern  . Not on file   Social History Narrative  . No narrative on file   Family History  Problem Relation Age of Onset  . Diabetes Mother   . Cancer Father    Current Outpatient Prescriptions on File Prior to Visit  Medication Sig Dispense Refill  . amitriptyline (ELAVIL) 25 MG tablet Take 100 mg by mouth at bedtime.      Marland Kitchen amLODipine (NORVASC) 2.5 MG tablet TAKE ONE TABLET BY MOUTH ONCE DAILY  90 tablet  3  . atorvastatin (LIPITOR) 20 MG tablet TAKE ONE TABLET BY MOUTH AT BEDTIME  90 tablet  3  . citalopram (CELEXA) 20 MG tablet TAKE ONE TABLET BY MOUTH ONCE DAILY  90 tablet  3  . Fluticasone-Salmeterol (ADVAIR DISKUS) 250-50 MCG/DOSE AEPB Inhale 1 puff into the lungs once.      . gabapentin (NEURONTIN) 600 MG tablet Take 600 mg by mouth 3 (three) times daily.      Marland Kitchen glucose blood test strip One Touch Ultra Test Strips Use to check blood glucose once a day Dx:  250.00  50 each  2  . hydrOXYzine (ATARAX/VISTARIL)  25 MG tablet Take 25 mg by mouth 3 (three) times daily as needed.      Marland Kitchen losartan-hydrochlorothiazide (HYZAAR) 100-25 MG per tablet Take 1 tablet by mouth daily.  90 tablet  3  . meloxicam (MOBIC) 15 MG tablet Take 1 tablet (15 mg total) by mouth daily.  90 tablet  1  . metFORMIN (GLUCOPHAGE) 500 MG tablet TAKE ONE TABLET BY MOUTH TWICE DAILY  180 tablet  3  . fish oil-omega-3 fatty acids 1000 MG capsule Take 2 g by mouth daily.       No current facility-administered medications on file prior to visit.   Allergies  Allergen Reactions  . Aspirin Hives   Immunization History  Administered Date(s) Administered  . Influenza Whole 03/13/2010  . Tdap 03/13/2010   Prior to Admission medications   Medication Sig Start Date End Date Taking? Authorizing Provider  amitriptyline (ELAVIL) 25 MG tablet Take 100 mg by mouth at bedtime.   Yes  Historical Provider, MD  amLODipine (NORVASC) 2.5 MG tablet TAKE ONE TABLET BY MOUTH ONCE DAILY 10/01/12  Yes Ileana Ladd, MD  atorvastatin (LIPITOR) 20 MG tablet TAKE ONE TABLET BY MOUTH AT BEDTIME 10/01/12  Yes Ileana Ladd, MD  citalopram (CELEXA) 20 MG tablet TAKE ONE TABLET BY MOUTH ONCE DAILY 10/01/12  Yes Ileana Ladd, MD  Fluticasone-Salmeterol (ADVAIR DISKUS) 250-50 MCG/DOSE AEPB Inhale 1 puff into the lungs once.   Yes Historical Provider, MD  gabapentin (NEURONTIN) 600 MG tablet Take 600 mg by mouth 3 (three) times daily.   Yes Historical Provider, MD  glucose blood test strip One Touch Ultra Test Strips Use to check blood glucose once a day Dx:  250.00 05/29/12  Yes Ileana Ladd, MD  hydrOXYzine (ATARAX/VISTARIL) 25 MG tablet Take 25 mg by mouth 3 (three) times daily as needed.   Yes Historical Provider, MD  losartan-hydrochlorothiazide (HYZAAR) 100-25 MG per tablet Take 1 tablet by mouth daily. 10/01/12  Yes Ileana Ladd, MD  meloxicam (MOBIC) 15 MG tablet Take 1 tablet (15 mg total) by mouth daily. 10/01/12  Yes Ileana Ladd, MD  metFORMIN (GLUCOPHAGE) 500 MG tablet TAKE ONE TABLET BY MOUTH TWICE DAILY 10/01/12  Yes Ileana Ladd, MD  fish oil-omega-3 fatty acids 1000 MG capsule Take 2 g by mouth daily.    Historical Provider, MD     ROS: As above in the HPI. All other systems are stable or negative.  OBJECTIVE: APPEARANCE:  Patient in no acute distress.The patient appeared well nourished and normally developed. Acyanotic. Waist: VITAL SIGNS:BP 132/75  Pulse 70  Temp(Src) 98.4 F (36.9 C) (Oral)  Ht 5\' 6"  (1.676 m)  Wt 191 lb 6.4 oz (86.818 kg)  BMI 30.91 kg/m2 WF overweight  SKIN: warm and  Dry without overt rashes, tattoos and scars  HEAD and Neck: without JVD, Head and scalp: normal Eyes:No scleral icterus. Fundi normal, eye movements normal. Ears: Auricle normal, canal normal, Tympanic membranes normal, insufflation normal. Nose: normal Throat:  normal Neck & thyroid: normal  CHEST & LUNGS: Chest wall: normal Lungs: Clear  CVS: Reveals the PMI to be normally located. Regular rhythm, First and Second Heart sounds are normal,  absence of murmurs, rubs or gallops. Peripheral vasculature: Radial pulses: normal Dorsal pedis pulses: normal Posterior pulses: normal  ABDOMEN:  Appearance: normal Benign, no organomegaly, no masses, no Abdominal Aortic enlargement. No Guarding , no rebound. No Bruits. Bowel sounds: normal  RECTAL: N/A GU: N/A  EXTREMETIES: nonedematous.  MUSCULOSKELETAL:  Spine: normal Joints: intact  NEUROLOGIC: oriented to time,place and person; nonfocal. Strength is normal Sensory is normal Reflexes are normal Cranial Nerves are normal. Dix-Hallpike : negative.  ASSESSMENT: DM (diabetes mellitus) - Plan: POCT glycosylated hemoglobin (Hb A1C)  HLD (hyperlipidemia) - Plan: CMP14+EGFR, NMR, lipoprofile  HTN (hypertension) - Plan: CMP14+EGFR  Chronic back pain  COPD (chronic obstructive pulmonary disease)  Unspecified hypothyroidism  Unspecified vitamin D deficiency  Dizziness and giddiness - Plan: EKG 12-Lead, POCT CBC, Ambulatory referral to Cardiology  PLAN:  Orders Placed This Encounter  Procedures  . CMP14+EGFR  . NMR, lipoprofile  . Ambulatory referral to Cardiology    Referral Priority:  Urgent    Referral Type:  Consultation    Referral Reason:  Specialty Services Required    Requested Specialty:  Cardiology    Number of Visits Requested:  1  . POCT CBC  . POCT glycosylated hemoglobin (Hb A1C)  . EKG 12-Lead  EKG: no acute findings. However , patient has risk factors and needs to have her cardiac status evaluated especially in view of symptoms with house chores. Call 911 if chest pain occurs. Observe her sugars closely and have her emergency steps in place in case of hypoglycemia.  No orders of the defined types were placed in this encounter.   There are no  discontinued medications. Return in about 3 months (around 04/08/2013) for Recheck medical problems.  Jarriel Papillion P. Modesto Charon, M.D.

## 2013-01-07 ENCOUNTER — Telehealth: Payer: Self-pay | Admitting: Family Medicine

## 2013-01-07 LAB — NMR, LIPOPROFILE
Cholesterol: 215 mg/dL — ABNORMAL HIGH (ref <200)
HDL Cholesterol by NMR: 45 mg/dL (ref >=40)
HDL Particle Number: 30.3 umol/L — ABNORMAL LOW (ref >=30.5)
LDL Particle Number: 2057 nmol/L — ABNORMAL HIGH (ref <1000)
LDL Size: 20.2 nm — ABNORMAL LOW (ref >20.5)
LDLC SERPL CALC-MCNC: 111 mg/dL — ABNORMAL HIGH (ref <100)
LP-IR Score: 80 — ABNORMAL HIGH (ref <=45)
Small LDL Particle Number: 1368 nmol/L — ABNORMAL HIGH (ref <=527)
Triglycerides by NMR: 293 mg/dL — ABNORMAL HIGH (ref <150)

## 2013-01-07 LAB — CMP14+EGFR
ALT: 10 IU/L (ref 0–32)
AST: 16 IU/L (ref 0–40)
Albumin/Globulin Ratio: 1.8 (ref 1.1–2.5)
Albumin: 4.8 g/dL (ref 3.6–4.8)
Alkaline Phosphatase: 63 IU/L (ref 39–117)
BUN/Creatinine Ratio: 13 (ref 11–26)
BUN: 10 mg/dL (ref 8–27)
CO2: 29 mmol/L (ref 18–29)
Calcium: 10.5 mg/dL — ABNORMAL HIGH (ref 8.6–10.2)
Chloride: 97 mmol/L (ref 97–108)
Creatinine, Ser: 0.75 mg/dL (ref 0.57–1.00)
GFR calc Af Amer: 98 mL/min/{1.73_m2} (ref >59)
GFR calc non Af Amer: 85 mL/min/{1.73_m2} (ref >59)
Globulin, Total: 2.7 g/dL (ref 1.5–4.5)
Glucose: 89 mg/dL (ref 65–99)
Potassium: 4.6 mmol/L (ref 3.5–5.2)
Sodium: 140 mmol/L (ref 134–144)
Total Bilirubin: <0.2 mg/dL (ref 0.0–1.2)
Total Protein: 7.5 g/dL (ref 6.0–8.5)

## 2013-01-08 ENCOUNTER — Other Ambulatory Visit: Payer: Self-pay | Admitting: Family Medicine

## 2013-01-08 DIAGNOSIS — E785 Hyperlipidemia, unspecified: Secondary | ICD-10-CM

## 2013-01-08 DIAGNOSIS — E119 Type 2 diabetes mellitus without complications: Secondary | ICD-10-CM

## 2013-01-08 MED ORDER — ATORVASTATIN CALCIUM 20 MG PO TABS: ORAL_TABLET | ORAL | Status: DC

## 2013-01-08 MED ORDER — ATORVASTATIN CALCIUM 40 MG PO TABS: ORAL_TABLET | ORAL | Status: DC

## 2013-01-08 MED ORDER — GABAPENTIN 600 MG PO TABS: 600.0000 mg | ORAL_TABLET | Freq: Three times a day (TID) | ORAL | Status: DC

## 2013-01-08 NOTE — Telephone Encounter (Signed)
Call patient : Prescription refilled & sent to pharmacy in [place]. 

## 2013-01-09 NOTE — Telephone Encounter (Signed)
Pt.notified

## 2013-01-10 ENCOUNTER — Encounter: Payer: Self-pay | Admitting: Cardiology

## 2013-01-10 ENCOUNTER — Ambulatory Visit (INDEPENDENT_AMBULATORY_CARE_PROVIDER_SITE_OTHER): Payer: BC Managed Care – PPO | Admitting: Cardiology

## 2013-01-10 ENCOUNTER — Encounter: Payer: Self-pay | Admitting: *Deleted

## 2013-01-10 VITALS — BP 109/71 | HR 90 | Ht 66.0 in | Wt 190.0 lb

## 2013-01-10 DIAGNOSIS — R079 Chest pain, unspecified: Secondary | ICD-10-CM

## 2013-01-10 NOTE — Progress Notes (Signed)
Clinical Summary Ms. Rhude is a 62 y.o.female referred by Dr Modesto Charon for dizziness.  1. Chest pain - pain started 2 weeks ago. Dull pain 5/10, mid chest. No associated symptom. Can occur at rest or with exertion. Nothing makes better or worst. Lasts for a few minutes up to 30 minutes. Occurs daily. No association with eating or food. Often symptoms associated with dizziness/lightheadedness. Episode of diaphoresis 2 weeks ago with dizziness, with chest pain. Episode lasted approx 1 hour.   - fairly sedentary lifestyle, highest level exertion is sweeping. Denies any significant DOE.   CAD risk factors: DM, HTN, HL, former tobacco    Past Medical History  Diagnosis Date  . Bursitis of hip   . Low back pain     benign turmor back  . COPD (chronic obstructive pulmonary disease)   . Hypothyroidism   . Vitamin D deficiency disease   . Hypertension   . Diabetes mellitus without complication     type II     Allergies  Allergen Reactions  . Aspirin Hives     Current Outpatient Prescriptions  Medication Sig Dispense Refill  . amitriptyline (ELAVIL) 25 MG tablet Take 100 mg by mouth at bedtime.      Marland Kitchen amLODipine (NORVASC) 2.5 MG tablet TAKE ONE TABLET BY MOUTH ONCE DAILY  90 tablet  3  . atorvastatin (LIPITOR) 40 MG tablet TAKE ONE TABLET BY MOUTH AT BEDTIME  90 tablet  3  . citalopram (CELEXA) 20 MG tablet TAKE ONE TABLET BY MOUTH ONCE DAILY  90 tablet  3  . fish oil-omega-3 fatty acids 1000 MG capsule Take 2 g by mouth daily.      . Fluticasone-Salmeterol (ADVAIR DISKUS) 250-50 MCG/DOSE AEPB Inhale 1 puff into the lungs once.      . gabapentin (NEURONTIN) 600 MG tablet Take 1 tablet (600 mg total) by mouth 3 (three) times daily.  90 tablet  3  . glucose blood test strip One Touch Ultra Test Strips Use to check blood glucose once a day Dx:  250.00  50 each  2  . hydrOXYzine (ATARAX/VISTARIL) 25 MG tablet Take 25 mg by mouth 3 (three) times daily as needed.      Marland Kitchen  losartan-hydrochlorothiazide (HYZAAR) 100-25 MG per tablet Take 1 tablet by mouth daily.  90 tablet  3  . meloxicam (MOBIC) 15 MG tablet Take 1 tablet (15 mg total) by mouth daily.  90 tablet  1  . metFORMIN (GLUCOPHAGE) 500 MG tablet TAKE ONE TABLET BY MOUTh once DAILY  180 tablet  3   No current facility-administered medications for this visit.     Past Surgical History  Procedure Laterality Date  . Back surgery  1996    turmor on back   . Appendectomy    . Cholecystectomy    . Leg surgery Right     "clot and had drain in leg  rt lower leg     Allergies  Allergen Reactions  . Aspirin Hives      Family History  Problem Relation Age of Onset  . Diabetes Mother   . Cancer Father      Social History Ms. Ruzich reports that she quit smoking about 5 years ago. Her smoking use included Cigarettes. She smoked 0.00 packs per day. She does not have any smokeless tobacco history on file. Ms. Labriola reports that she does not drink alcohol.   Review of Systems CONSTITUTIONAL: No weight loss, fever, chills, weakness or fatigue.  HEENT: Eyes: No visual loss, blurred vision, double vision or yellow sclerae.No hearing loss, sneezing, congestion, runny nose or sore throat.  SKIN: No rash or itching.  CARDIOVASCULAR: per HPI RESPIRATORY: per HPI.  GASTROINTESTINAL: No anorexia, nausea, vomiting or diarrhea. No abdominal pain or blood.  GENITOURINARY: No burning on urination, no polyuria NEUROLOGICAL: per HPI MUSCULOSKELETAL: chronic back paiun LYMPHATICS: No enlarged nodes. No history of splenectomy.  PSYCHIATRIC: No history of depression or anxiety.  ENDOCRINOLOGIC: No reports of sweating, cold or heat intolerance. No polyuria or polydipsia.  Marland Kitchen   Physical Examination p 90 bp 109/71 Wt 190 lbs BMI 31 Gen: resting comfortably, no acute distress HEENT: no scleral icterus, pupils equal round and reactive, no palptable cervical adenopathy,  CV: RRR, no m/r/g, no JVD, no carotid  bruits Resp: Clear to auscultation bilaterally GI: abdomen is soft, non-tender, non-distended, normal bowel sounds, no hepatosplenomegaly MSK: extremities are warm, no edema.  Skin: warm, no rash Neuro:  no focal deficits Psych: appropriate affect   Diagnostic Studies 01/06/13 EKG: sinus rhythm, poor R-wave progression, non-specific ST/T changes    Assessment and Plan  1. Chest pain - history mixed as possible cardiac etiology, given her history of diabetes she certaintly could be having atypical angina. She has multiple CAD risk factors, and poor R-wave progression on her EKG.  - check stress echo, patient cannot run on treadmill because of chronic lower back pain.  - check 2D echo     Antoine Poche, M.D., F.A.C.C.

## 2013-01-10 NOTE — Patient Instructions (Signed)
Your physician has requested that you have an echocardiogram. Echocardiography is a painless test that uses sound waves to create images of your heart. It provides your doctor with information about the size and shape of your heart and how well your heart's chambers and valves are working. This procedure takes approximately one hour. There are no restrictions for this procedure. Your physician has requested that you have a dobutamine echocardiogram. For further information please visit [email or web address] Please follow instruction sheet as given. Office will contact with results via phone or letter.   Continue all current medications. Follow up in  3-4 weeks

## 2013-01-30 ENCOUNTER — Encounter (HOSPITAL_COMMUNITY): Payer: Self-pay

## 2013-01-30 ENCOUNTER — Ambulatory Visit (HOSPITAL_COMMUNITY)
Admission: RE | Admit: 2013-01-30 | Discharge: 2013-01-30 | Disposition: A | Discharge status: Home / Self Care | Payer: BC Managed Care – PPO | Source: Ambulatory Visit | Attending: Cardiology | Admitting: Cardiology

## 2013-01-30 DIAGNOSIS — J449 Chronic obstructive pulmonary disease, unspecified: Secondary | ICD-10-CM | POA: Insufficient documentation

## 2013-01-30 DIAGNOSIS — I10 Essential (primary) hypertension: Secondary | ICD-10-CM | POA: Insufficient documentation

## 2013-01-30 DIAGNOSIS — R079 Chest pain, unspecified: Secondary | ICD-10-CM

## 2013-01-30 DIAGNOSIS — R072 Precordial pain: Secondary | ICD-10-CM

## 2013-01-30 DIAGNOSIS — E785 Hyperlipidemia, unspecified: Secondary | ICD-10-CM | POA: Insufficient documentation

## 2013-01-30 DIAGNOSIS — Z6830 Body mass index (BMI) 30.0-30.9, adult: Secondary | ICD-10-CM | POA: Insufficient documentation

## 2013-01-30 DIAGNOSIS — Z87891 Personal history of nicotine dependence: Secondary | ICD-10-CM | POA: Insufficient documentation

## 2013-01-30 DIAGNOSIS — E119 Type 2 diabetes mellitus without complications: Secondary | ICD-10-CM | POA: Insufficient documentation

## 2013-01-30 DIAGNOSIS — J4489 Other specified chronic obstructive pulmonary disease: Secondary | ICD-10-CM | POA: Insufficient documentation

## 2013-01-30 DIAGNOSIS — I517 Cardiomegaly: Secondary | ICD-10-CM

## 2013-01-30 MED ORDER — DOBUTAMINE IN D5W 4-5 MG/ML-% IV SOLN
INTRAVENOUS | Status: AC
  Filled 2013-01-30: qty 250

## 2013-01-30 MED ORDER — ATROPINE SULFATE 1 MG/ML IJ SOLN
INTRAMUSCULAR | Status: AC
  Filled 2013-01-30: qty 1

## 2013-01-30 NOTE — Progress Notes (Signed)
Stress Lab Nurses Notes - Laura Dalton  Laura Dalton 01/30/2013 Reason for doing test: Chest Pain and Dizziness Type of test: Dobutamine Echo Nurse performing test: Parke Poisson, RN Nuclear Medicine Tech: Not Applicable Echo Tech: Veda Canning MD performing test: Koneswaran/K.Lawrence NP Family MD: Modesto Charon Test explained and consent signed: yes IV started: 20g jelco, KVO and No redness or edema Symptoms: SOB & Chest Pain Treatment/Intervention: Dobutamine titrated every 3 min until reaching THR 85%. Reason test stopped: reached target HR After recovery IV was: Discontinued and No redness or edema Patient to return to Nuc. Med at :NA Patient discharged: Home Patient's Condition upon discharge was: stable Comments: During test peak BP 170/69 & HR 141.  Recovery BP 136/78 & HR 80.  Symptoms resolved in recovery. Erskine Speed T

## 2013-01-30 NOTE — Progress Notes (Signed)
*  PRELIMINARY RESULTS* Echocardiogram 2D Echocardiogram has been performed.  Laura Dalton 01/30/2013, 11:55 AM

## 2013-01-30 NOTE — Progress Notes (Signed)
*  PRELIMINARY RESULTS* Echocardiogram Echocardiogram Pharmacologic Stress Test has been performed.  Laura Dalton 01/30/2013, 11:55 AM

## 2013-02-03 ENCOUNTER — Other Ambulatory Visit: Payer: Self-pay | Admitting: Cardiology

## 2013-02-04 ENCOUNTER — Telehealth: Payer: Self-pay | Admitting: *Deleted

## 2013-02-04 NOTE — Telephone Encounter (Signed)
Dobutamine Stress Echo -  Notes Recorded by Antoine Poche, MD on 02/03/2013 at 10:08 AM Please let patient know stress test was normal  Echo -  Notes Recorded by Antoine Poche, MD on 01/30/2013 at 3:18 PM Please let patient know there are no significant abnormalities on echo   Patient notified.  Declined any further follow up at this time.

## 2013-07-13 ENCOUNTER — Other Ambulatory Visit: Payer: Self-pay | Admitting: Family Medicine

## 2013-07-15 NOTE — Telephone Encounter (Signed)
Patient needs to be seen. Has exceeded time since last visit. Limited quantity refilled. Needs to bring all medications to next appointment.   

## 2013-07-15 NOTE — Telephone Encounter (Signed)
Patient last seen in office on 01-06-13. Last refill on 04-09-13 for #[id number]. Please advise

## 2013-07-18 ENCOUNTER — Other Ambulatory Visit: Payer: Self-pay | Admitting: Family Medicine

## 2013-07-21 NOTE — Telephone Encounter (Signed)
Last seen 01/06/13  [facility] 

## 2013-07-22 ENCOUNTER — Other Ambulatory Visit: Payer: Self-pay

## 2013-07-22 NOTE — Telephone Encounter (Signed)
Last seen 01/06/13 [facility]  Requesting 90 day supply

## 2013-07-23 MED ORDER — MELOXICAM 15 MG PO TABS: ORAL_TABLET | ORAL | Status: DC

## 2013-08-11 ENCOUNTER — Telehealth: Payer: Self-pay | Admitting: Family Medicine

## 2013-08-11 NOTE — Telephone Encounter (Signed)
appt given for Friday with Bill 

## 2013-08-12 ENCOUNTER — Other Ambulatory Visit: Payer: Self-pay | Admitting: *Deleted

## 2013-08-12 MED ORDER — GABAPENTIN 600 MG PO TABS: 600.0000 mg | ORAL_TABLET | Freq: Three times a day (TID) | ORAL | Status: DC

## 2013-08-12 NOTE — Telephone Encounter (Signed)
Has appt with you 08/15/13, pt is out

## 2013-08-15 ENCOUNTER — Ambulatory Visit (INDEPENDENT_AMBULATORY_CARE_PROVIDER_SITE_OTHER): Payer: BC Managed Care – PPO

## 2013-08-15 ENCOUNTER — Encounter: Payer: Self-pay | Admitting: Family Medicine

## 2013-08-15 ENCOUNTER — Ambulatory Visit (INDEPENDENT_AMBULATORY_CARE_PROVIDER_SITE_OTHER): Payer: BC Managed Care – PPO | Admitting: Family Medicine

## 2013-08-15 VITALS — BP 116/74 | HR 93 | Temp 98.7°F | Ht 66.0 in | Wt 207.0 lb

## 2013-08-15 DIAGNOSIS — M25559 Pain in unspecified hip: Secondary | ICD-10-CM

## 2013-08-15 DIAGNOSIS — M25551 Pain in right hip: Secondary | ICD-10-CM

## 2013-08-15 DIAGNOSIS — G5700 Lesion of sciatic nerve, unspecified lower limb: Secondary | ICD-10-CM

## 2013-08-15 DIAGNOSIS — J069 Acute upper respiratory infection, unspecified: Secondary | ICD-10-CM

## 2013-08-15 DIAGNOSIS — M549 Dorsalgia, unspecified: Secondary | ICD-10-CM

## 2013-08-15 DIAGNOSIS — G5701 Lesion of sciatic nerve, right lower limb: Secondary | ICD-10-CM

## 2013-08-15 MED ORDER — AZITHROMYCIN 250 MG PO TABS: ORAL_TABLET | ORAL | Status: DC

## 2013-08-15 MED ORDER — HYDROCODONE-ACETAMINOPHEN 5-325 MG PO TABS: 1.0000 | ORAL_TABLET | Freq: Four times a day (QID) | ORAL | Status: DC | PRN

## 2013-08-15 NOTE — Progress Notes (Signed)
   Subjective:    Patient ID: Laura Dalton, female    DOB: [birth year], 63 y.o.   MRN: [id number]  HPI  This 63 y.o. female presents for evaluation of right hip pain and discomfort.  Patient has hx of chronic back pain and she is in severe pain.  She states she has been hurting for some time and she is having problems with mobility and she feels like the pain levels are going to end her life.  She is teary and states that her pain is so severe in her back that she feels like she is going to have a stroke or heart attack.  She states her sleep is poor because of her high pain levels.  She is c/o uri sx's.  Review of Systems C/o right buttock and back pain.     No chest pain, SOB, HA, dizziness, vision change, N/V, diarrhea, constipation, dysuria, urinary urgency or frequency, myalgias, arthralgias or rash.  Objective:   Physical Exam  Vital signs noted  Well developed well nourished female.  HEENT - Head atraumatic Normocephalic                Eyes - PERRLA, Conjuctiva - clear Sclera- Clear EOMI                Ears - EAC's Wnl TM's Wnl Gross Hearing WNL Respiratory - Lungs CTA bilateral Cardiac - RRR S1 and S2 without murmur GI - Abdomen soft Nontender and bowel sounds active x 4 Extremities - No edema. Neuro - Grossly intact. MS - TTP right buttock and piriform muscles     Assessment & Plan:  Right hip pain - Plan: DG Hip Complete Right, Ambulatory referral to Physical Therapy, HYDROcodone-acetaminophen (NORCO) 5-325 MG per tablet, Ambulatory referral to Pain Clinic  Piriformis syndrome of right side - Plan: Ambulatory referral to Physical Therapy, HYDROcodone-acetaminophen (NORCO) 5-325 MG per tablet, Ambulatory referral to Pain Clinic  Back pain - Plan: Ambulatory referral to Pain Clinic.  Explained will write pain meds until seen by pain clinic.  URI (upper respiratory infection) - Plan: azithromycin (ZITHROMAX) 250 MG tablet Push po fluids, rest, tylenol and motrin otc prn  as directed for fever, arthralgias, and myalgias.  Follow up prn if sx's continue or persist.  Deatra Canter FNP

## 2013-08-25 ENCOUNTER — Ambulatory Visit: Payer: BC Managed Care – PPO | Attending: Family Medicine | Admitting: Physical Therapy

## 2013-08-25 DIAGNOSIS — IMO0001 Reserved for inherently not codable concepts without codable children: Secondary | ICD-10-CM | POA: Diagnosis not present

## 2013-08-25 DIAGNOSIS — R5381 Other malaise: Secondary | ICD-10-CM | POA: Diagnosis not present

## 2013-08-25 DIAGNOSIS — G5700 Lesion of sciatic nerve, unspecified lower limb: Secondary | ICD-10-CM | POA: Insufficient documentation

## 2013-08-25 DIAGNOSIS — M25559 Pain in unspecified hip: Secondary | ICD-10-CM | POA: Diagnosis not present

## 2013-08-26 ENCOUNTER — Encounter: Payer: Self-pay | Admitting: Physical Medicine & Rehabilitation

## 2013-08-27 ENCOUNTER — Ambulatory Visit: Payer: BC Managed Care – PPO | Admitting: *Deleted

## 2013-08-27 DIAGNOSIS — IMO0001 Reserved for inherently not codable concepts without codable children: Secondary | ICD-10-CM | POA: Diagnosis not present

## 2013-08-29 ENCOUNTER — Other Ambulatory Visit: Payer: Self-pay | Admitting: *Deleted

## 2013-09-01 ENCOUNTER — Other Ambulatory Visit: Payer: Self-pay | Admitting: Family Medicine

## 2013-09-01 ENCOUNTER — Ambulatory Visit: Payer: BC Managed Care – PPO | Admitting: *Deleted

## 2013-09-01 DIAGNOSIS — G5701 Lesion of sciatic nerve, right lower limb: Secondary | ICD-10-CM

## 2013-09-01 DIAGNOSIS — IMO0001 Reserved for inherently not codable concepts without codable children: Secondary | ICD-10-CM | POA: Diagnosis not present

## 2013-09-01 DIAGNOSIS — M25551 Pain in right hip: Secondary | ICD-10-CM

## 2013-09-02 MED ORDER — HYDROCODONE-ACETAMINOPHEN 5-325 MG PO TABS: 1.0000 | ORAL_TABLET | Freq: Four times a day (QID) | ORAL | Status: DC | PRN

## 2013-09-02 MED ORDER — AMITRIPTYLINE HCL 25 MG PO TABS: 100.0000 mg | ORAL_TABLET | Freq: Every day | ORAL | Status: DC

## 2013-09-02 NOTE — Telephone Encounter (Signed)
Patient is scheduled with pain management on 11/04/13. According to last office note on 08/15/13 we will write pain medication until she is seen by them.

## 2013-09-03 ENCOUNTER — Telehealth: Payer: Self-pay | Admitting: Nurse Practitioner

## 2013-09-03 NOTE — Telephone Encounter (Signed)
Already done on 09/02/13

## 2013-09-04 ENCOUNTER — Ambulatory Visit: Payer: BC Managed Care – PPO | Admitting: *Deleted

## 2013-09-04 DIAGNOSIS — IMO0001 Reserved for inherently not codable concepts without codable children: Secondary | ICD-10-CM | POA: Diagnosis not present

## 2013-09-08 ENCOUNTER — Ambulatory Visit: Payer: BC Managed Care – PPO | Admitting: *Deleted

## 2013-09-08 DIAGNOSIS — IMO0001 Reserved for inherently not codable concepts without codable children: Secondary | ICD-10-CM | POA: Diagnosis not present

## 2013-09-10 ENCOUNTER — Encounter: Payer: Self-pay | Admitting: Physician Assistant

## 2013-09-10 ENCOUNTER — Ambulatory Visit (INDEPENDENT_AMBULATORY_CARE_PROVIDER_SITE_OTHER): Payer: BC Managed Care – PPO | Admitting: Physician Assistant

## 2013-09-10 VITALS — BP 126/70 | HR 62 | Temp 99.0°F | Ht 66.0 in | Wt 212.8 lb

## 2013-09-10 DIAGNOSIS — L03119 Cellulitis of unspecified part of limb: Secondary | ICD-10-CM

## 2013-09-10 DIAGNOSIS — L03115 Cellulitis of right lower limb: Secondary | ICD-10-CM

## 2013-09-10 DIAGNOSIS — L02619 Cutaneous abscess of unspecified foot: Secondary | ICD-10-CM

## 2013-09-10 MED ORDER — CEPHALEXIN 500 MG PO CAPS: 500.0000 mg | ORAL_CAPSULE | Freq: Four times a day (QID) | ORAL | Status: DC

## 2013-09-10 NOTE — Patient Instructions (Signed)
Cellulitis Cellulitis is an infection of the skin and the tissue beneath it. The infected area is usually red and tender. Cellulitis occurs most often in the arms and lower legs.  CAUSES  Cellulitis is caused by bacteria that enter the skin through cracks or cuts in the skin. The most common types of bacteria that cause cellulitis are Staphylococcus and Streptococcus. SYMPTOMS   Redness and warmth.  Swelling.  Tenderness or pain.  Fever. DIAGNOSIS  Your caregiver can usually determine what is wrong based on a physical exam. Blood tests may also be done. TREATMENT  Treatment usually involves taking an antibiotic medicine. HOME CARE INSTRUCTIONS   Take your antibiotics as directed. Finish them even if you start to feel better.  Keep the infected arm or leg elevated to reduce swelling.  Apply a warm cloth to the affected area up to 4 times per day to relieve pain.  Only take over-the-counter or prescription medicines for pain, discomfort, or fever as directed by your caregiver.  Keep all follow-up appointments as directed by your caregiver. SEEK MEDICAL CARE IF:   You notice red streaks coming from the infected area.  Your red area gets larger or turns dark in color.  Your bone or joint underneath the infected area becomes painful after the skin has healed.  Your infection returns in the same area or another area.  You notice a swollen bump in the infected area.  You develop new symptoms. SEEK IMMEDIATE MEDICAL CARE IF:   You have a fever.  You feel very sleepy.  You develop vomiting or diarrhea.  You have a general ill feeling (malaise) with muscle aches and pains. MAKE SURE YOU:   Understand these instructions.  Will watch your condition.  Will get help right away if you are not doing well or get worse. Document Released: 12/07/2004 Document Revised: 08/29/2011 Document Reviewed: 05/15/2011 Exit[employer] Patient Information 2015 [employer]. This information is  not intended to replace advice given to you by your health care provider. Make sure you discuss any questions you have with your health care provider.  

## 2013-09-10 NOTE — Progress Notes (Signed)
Subjective:     Patient ID: Laura Dalton, female   DOB: 09/21/1950, 63 y.o.   MRN: [id number]  HPI Pt with pain and redness to the R heel She has been using OTC ATB cream but area seem to be getting worse  Review of Systems + redness and pain to the R heel + drainage from the site Sx worse with weightbearing No trauma to the site    Objective:   Physical Exam + erythema and edema to the R heel/Achilles area Well de marc.. raised erythem border + scale noted FROM of the foot/ankle Good pulses/sensory Sl TTP Sl drainage    Assessment:     Cellulitis    Plan:     Keflex 500mg  bid x 10days OTC Clotrimazole Keep area clean/dry Keep f/u appt with [facility] in 1 week

## 2013-09-17 ENCOUNTER — Encounter: Payer: Self-pay | Admitting: Family Medicine

## 2013-09-17 ENCOUNTER — Ambulatory Visit: Payer: BC Managed Care – PPO | Attending: Family Medicine | Admitting: *Deleted

## 2013-09-17 ENCOUNTER — Ambulatory Visit (INDEPENDENT_AMBULATORY_CARE_PROVIDER_SITE_OTHER): Payer: BC Managed Care – PPO | Admitting: Family Medicine

## 2013-09-17 VITALS — BP 114/75 | HR 60 | Temp 97.1°F | Ht 66.0 in | Wt 214.6 lb

## 2013-09-17 DIAGNOSIS — IMO0001 Reserved for inherently not codable concepts without codable children: Secondary | ICD-10-CM | POA: Insufficient documentation

## 2013-09-17 DIAGNOSIS — G5700 Lesion of sciatic nerve, unspecified lower limb: Secondary | ICD-10-CM | POA: Insufficient documentation

## 2013-09-17 DIAGNOSIS — M25559 Pain in unspecified hip: Secondary | ICD-10-CM | POA: Diagnosis not present

## 2013-09-17 DIAGNOSIS — E119 Type 2 diabetes mellitus without complications: Secondary | ICD-10-CM

## 2013-09-17 DIAGNOSIS — R21 Rash and other nonspecific skin eruption: Secondary | ICD-10-CM

## 2013-09-17 DIAGNOSIS — R5381 Other malaise: Secondary | ICD-10-CM | POA: Insufficient documentation

## 2013-09-17 LAB — POCT CBC
Granulocyte percent: 69.6 %G (ref 37–80)
HCT, POC: 38 % (ref 37.7–47.9)
Hemoglobin: 12.3 g/dL (ref 12.2–16.2)
Lymph, poc: 2.3 (ref 0.6–3.4)
MCH, POC: 27.7 pg (ref 27–31.2)
MCHC: 32.4 g/dL (ref 31.8–35.4)
MCV: 85.7 fL (ref 80–97)
MPV: 8 fL (ref 0–99.8)
POC Granulocyte: 6.5 (ref 2–6.9)
POC LYMPH PERCENT: 24.3 %L (ref 10–50)
Platelet Count, POC: 253 10*3/uL (ref 142–424)
RBC: 4.4 M/uL (ref 4.04–5.48)
RDW, POC: 13.7 %
WBC: 9.3 10*3/uL (ref 4.6–10.2)

## 2013-09-17 LAB — POCT GLYCOSYLATED HEMOGLOBIN (HGB A1C): Hemoglobin A1C: 6.2

## 2013-09-17 MED ORDER — NYSTATIN-TRIAMCINOLONE 100000-0.1 UNIT/GM-% EX OINT: 1.0000 "application " | TOPICAL_OINTMENT | Freq: Two times a day (BID) | CUTANEOUS | Status: DC

## 2013-09-17 MED ORDER — MELOXICAM 15 MG PO TABS: ORAL_TABLET | ORAL | Status: DC

## 2013-09-17 MED ORDER — TERBINAFINE HCL 250 MG PO TABS: 250.0000 mg | ORAL_TABLET | Freq: Every day | ORAL | Status: DC

## 2013-09-17 MED ORDER — GABAPENTIN 600 MG PO TABS: 600.0000 mg | ORAL_TABLET | Freq: Three times a day (TID) | ORAL | Status: DC

## 2013-09-17 NOTE — Progress Notes (Signed)
   Subjective:    Patient ID: Laura Dalton, female    DOB: 07/22/1950, 63 y.o.   MRN: [id number]  HPI  Patient is here for follow up on hypertension and diabetes.  She has been recently seen for a cellulitis of the right lower ankle and was on keflex but it didn't help she states.  She c/o a rash that she thinks is ringworm possibly.  Review of Systems C/o rash on right lower ankle and heel No chest pain, SOB, HA, dizziness, vision change, N/V, diarrhea, constipation, dysuria, urinary urgency or frequency, myalgias, arthralgias or rash.     Objective:   Physical Exam Vital signs noted  Well developed well nourished female.  HEENT - Head atraumatic Normocephalic                Eyes - PERRLA, Conjuctiva - clear Sclera- Clear EOMI                Ears - EAC's Wnl TM's Wnl Gross Hearing WNL                Nose - Nares patent                 Throat - oropharanx wnl Respiratory - Lungs CTA bilateral Cardiac - RRR S1 and S2 without murmur GI - Abdomen soft Nontender and bowel sounds active x 4 Extremities - No edema. Neuro - Grossly intact. Skin - Right ankle and heel with anular erythematous rash      Assessment & Plan:  Rash and nonspecific skin eruption - Plan: mycolog cream bid #30 grams lamisil 250mg  onepo qd #7 w/1 rf  HTN - Continue current antihypertensive medication amlodipine.  DM - Continue Metformin and check hgbaic, cmp, cbc.  Continue current regimen and avoid  Excessive carbs in diet.  Lose weight and exercise.  Neuropathy - Gabapentin 600mg  rx refilled  Follow up in 3 months and patient states she would prefer to follow up whenever she has an acute problem or when she feels she needs to and advised her that in order to adequately tx her diabetes, Neuropathy, and hypertension, she needs to be compliant with the regimen.  Deatra CanterWilliam J [facility] FNP

## 2013-09-18 LAB — CMP14+EGFR
ALT: 10 IU/L (ref 0–32)
AST: 17 IU/L (ref 0–40)
Albumin/Globulin Ratio: 2 (ref 1.1–2.5)
Albumin: 4.6 g/dL (ref 3.6–4.8)
Alkaline Phosphatase: 59 IU/L (ref 39–117)
BUN/Creatinine Ratio: 18 (ref 11–26)
BUN: 14 mg/dL (ref 8–27)
CO2: 28 mmol/L (ref 18–29)
Calcium: 9.6 mg/dL (ref 8.7–10.3)
Chloride: 97 mmol/L (ref 97–108)
Creatinine, Ser: 0.79 mg/dL (ref 0.57–1.00)
GFR calc Af Amer: 91 mL/min/{1.73_m2} (ref >59)
GFR calc non Af Amer: 79 mL/min/{1.73_m2} (ref >59)
Globulin, Total: 2.3 g/dL (ref 1.5–4.5)
Glucose: 104 mg/dL — ABNORMAL HIGH (ref 65–99)
Potassium: 4.1 mmol/L (ref 3.5–5.2)
Sodium: 143 mmol/L (ref 134–144)
Total Bilirubin: 0.2 mg/dL (ref 0.0–1.2)
Total Protein: 6.9 g/dL (ref 6.0–8.5)

## 2013-09-19 ENCOUNTER — Telehealth: Payer: Self-pay | Admitting: Family Medicine

## 2013-09-19 NOTE — Telephone Encounter (Signed)
Message copied by Azalee CourseFULP, Briella Hobday on Fri Sep 19, 2013 10:01 AM ------      Message from: Deatra CanterXFORD, WILLIAM J      Created: Fri Sep 19, 2013  9:25 AM       Labs normal ------

## 2013-09-24 ENCOUNTER — Encounter: Payer: Self-pay | Admitting: Family Medicine

## 2013-10-20 ENCOUNTER — Other Ambulatory Visit: Payer: Self-pay | Admitting: *Deleted

## 2013-10-20 MED ORDER — LOSARTAN POTASSIUM-HCTZ 100-25 MG PO TABS: 1.0000 | ORAL_TABLET | Freq: Every day | ORAL | Status: DC

## 2013-11-04 ENCOUNTER — Other Ambulatory Visit: Payer: Self-pay | Admitting: Physical Medicine & Rehabilitation

## 2013-11-04 ENCOUNTER — Encounter: Payer: Self-pay | Admitting: Physical Medicine & Rehabilitation

## 2013-11-04 ENCOUNTER — Encounter
Payer: BC Managed Care – PPO | Attending: Physical Medicine & Rehabilitation | Admitting: Physical Medicine & Rehabilitation

## 2013-11-04 ENCOUNTER — Ambulatory Visit
Admission: RE | Admit: 2013-11-04 | Discharge: 2013-11-04 | Disposition: A | Discharge status: Home / Self Care | Payer: BC Managed Care – PPO | Source: Ambulatory Visit | Attending: Physical Medicine & Rehabilitation | Admitting: Physical Medicine & Rehabilitation

## 2013-11-04 VITALS — BP 132/71 | HR 90 | Resp 14 | Ht 66.0 in | Wt 207.0 lb

## 2013-11-04 DIAGNOSIS — E1149 Type 2 diabetes mellitus with other diabetic neurological complication: Secondary | ICD-10-CM | POA: Diagnosis not present

## 2013-11-04 DIAGNOSIS — M961 Postlaminectomy syndrome, not elsewhere classified: Secondary | ICD-10-CM | POA: Insufficient documentation

## 2013-11-04 DIAGNOSIS — M2170 Unequal limb length (acquired), unspecified site: Secondary | ICD-10-CM | POA: Insufficient documentation

## 2013-11-04 DIAGNOSIS — E1142 Type 2 diabetes mellitus with diabetic polyneuropathy: Secondary | ICD-10-CM

## 2013-11-04 DIAGNOSIS — G8929 Other chronic pain: Secondary | ICD-10-CM | POA: Diagnosis not present

## 2013-11-04 DIAGNOSIS — Z5181 Encounter for therapeutic drug level monitoring: Secondary | ICD-10-CM | POA: Diagnosis not present

## 2013-11-04 DIAGNOSIS — Z79899 Other long term (current) drug therapy: Secondary | ICD-10-CM

## 2013-11-04 MED ORDER — METHOCARBAMOL 500 MG PO TABS: 500.0000 mg | ORAL_TABLET | Freq: Four times a day (QID) | ORAL | Status: DC | PRN

## 2013-11-04 NOTE — Addendum Note (Signed)
Addended by: Sherre Poot on: 11/04/2013 11:40 AM   Modules accepted: Orders

## 2013-11-04 NOTE — Progress Notes (Signed)
Subjective:    Patient ID: Laura Dalton, female    DOB: [birth year], 63 y.o.   MRN: [id number]  HPI  Laura Dalton is here for an initial evaluation today. 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 She states a rib had to be cut to access her spine. She has had increased pain in her mid and lower  Back on the left side since that time when she stands or walks more than 10 or 15 minutes at a time. She was placed on amitriptyline which may have helped for awhile. She has received some hydrocodone in the past as well.   Additionally, she began to develop left hip pain in June, necessitating a trip to her family doctor in June. The xray of her hip was benign. She was diagnosed with a piriformis syndrome and sent to therapy. Therapy worked on massage, used a TENS unit which seemed to help until about 3 weeks.  She last went to therapy in early July.  Her buttock pain is now more in her "tail bone" , bothering her more if she sits for prolonged periods of time---pain will often radiate down her right leg to her ankle. She denies any frank weakness or sensory loss associated with the pain.   She has Type II DM--she is on metformin. Her sugars have been fairly well controlled.   For pain she's taking elavil and gabapentin--more so for diabetic neuropathic pain. She is also taking meloxicam for back and hip pain which she feels may help somewhat. It has been a month or two since she's been on hydrocodone due to policy at her PCP's office.  She typically uses a cane for support and to remind herself of using better posture. Her exercise is really limited. She does some stretching and ROM exercises in her chair. She keeps up with her canning and activities around the house. Her walking is limited enough where she really can't go too far. She uses a buggy to help support herself around the grocery store.     Pain  Inventory Average Pain 6 Pain Right Now 5 My pain is sharp and aching  In the last 24 hours, has pain interfered with the following? General activity 6 Relation with others 5 Enjoyment of life 10 What TIME of day is your pain at its worst? daytime Sleep (in general) Poor  Pain is worse with: walking, bending and standing Pain improves with: rest, heat/ice, medication and TENS Relief from Meds: 3  Mobility walk with assistance use a cane how many minutes can you walk? 5 ability to climb steps?  yes do you drive?  yes transfers alone  Function retired I need assistance with the following:  dressing and household duties Do you have any goals in this area?  no  Neuro/Psych bowel control problems weakness trouble walking spasms dizziness confusion depression loss of taste or smell  Prior Studies Any changes since last visit?  no  Physicians involved in your care Any changes since last visit?  no   Family History  Problem Relation Age of Onset  . Diabetes Mother   . Cancer Father    History   Social History  . Marital Status: Married    Spouse Name: N/A    Number of Children: N/A  . Years of Education: N/A   Occupational History  . 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 Smoking status: Former Smoker -- 0.50 packs/day for 45 years    Types: Cigarettes    Quit date: 12/01/2007  . Smokeless tobacco: Never Used  . Alcohol Use: No  . Drug Use: No  . Sexual Activity: None   Other Topics Concern  . None   Social History Narrative  . None   Past Surgical History  Procedure Laterality Date  . Back surgery  1996    turmor on back   . Appendectomy    . Cholecystectomy    . Leg surgery Right     "clot and had drain in leg  rt lower leg   Past Medical History  Diagnosis Date  . Bursitis of hip   . Low back pain     benign turmor back  . COPD (chronic obstructive pulmonary disease)   . Hypothyroidism   . Vitamin D deficiency  disease   . Hypertension   . Diabetes mellitus without complication     type II   BP 132/71  Pulse 90  Resp 14  Ht  (1.676 m)  Wt 207 lb (93.895 kg)  BMI 33.43 kg/m2  SpO2 95%  Opioid Risk Score: 4 Fall Risk Score: Moderate Fall Risk (6-13 points) (pt educated given handout)   Review of Systems  Constitutional: Positive for unexpected weight change.  HENT:       Loss of taste or smell  Gastrointestinal: Positive for diarrhea.  Endocrine:       High blood sugar  Genitourinary:       Bowel control problems  Musculoskeletal: Positive for back pain and gait problem.  Neurological: Positive for dizziness and weakness.       Spasms  Hematological: Bruises/bleeds easily.  Psychiatric/Behavioral: Positive for confusion.       Depression  All other systems reviewed and are negative.      Objective:   Physical Exam   General: Alert and oriented x 3, No apparent distress. obese HEENT: Head is normocephalic, atraumatic, PERRLA, EOMI, sclera anicteric, oral mucosa pink and moist, dentition intact, ext ear canals clear,  Neck: Supple without JVD or lymphadenopathy Heart: Reg rate and rhythm. No murmurs rubs or gallops Chest: CTA bilaterally without wheezes, rales, or rhonchi; no distress Abdomen: Soft, non-tender, non-distended, bowel sounds positive. Extremities: No clubbing, cyanosis, or edema. Pulses are 2+ Skin: Clean and intact without signs of breakdown Neuro: Pt is cognitively appropriate with normal insight, memory, and awareness. Cranial nerves 2-12 are intact (except for being a little HOH) Sensory exam is decreased in stocking glove distribution  Reflexes are 2+ in all 4's. Fine motor coordination is intact. No tremors. Motor function is grossly 5/5.  Musculoskeletal: able to flex at the waist to almost 90 degrees. 5-10 degrees dextroscoliosis in mid/upper lumbar spine. Right hemipelvis elevated. Right leg measures 2cm longer than right from med mall to ASIS. Tends  to talk with right foot rotated medially with less heel strike on that side. slr negative except for hamstring tightness. PSIS non-tender. FABER neg. Mild pain with ext/rotation/lateral bending. Tenderness and spasms/scar along left thoracic and lumbar paraspinals.  Psych: Pt's affect is appropriate. Pt is cooperative        Assessment & Plan:  1. Hx of thoracic spine surgery with excision of tumor (20 years ago) 2. Hx of ?discectomy/laminectomy at age 51 3. Left leg length discrepancy 4. Chronic left back pain and hip pain related to the above 5. Obesity  6. Diabetes with polyneuropathy  Plan: 1.Xrays of lumbar and thoracic spine--she has a mild dextroscoliosis, but most of her asymmetry is in her pelvis and left leg.  2. Sent to [facility] for formal leg length measurement and lift/build up 3. Robaxin prn for back spasms 4. Will make a referral to outpt PT to address ROM, posture, stretching, modalities, and appropriate HEP 5. UDS was collected. Will consider narcotic rx of pain, although we will attempt to treat her problems directly rather than the just the pain---pt agrees. 6. Will arrange follow up for about 6 weeks. Forty-five minutes of face to face patient care time were spent during this visit. All questions were encouraged and answered.

## 2013-11-04 NOTE — Patient Instructions (Signed)
ONCE I HAVE CONFIRMATION THAT YOUR URINE SPECIMEN IS CONSISTENT WITH YOUR HISTORY AND PRESCRIBED MEDICATIONS, I WILL BE WILLING TO PRESCRIBE YOUR PAIN MEDICATION. THE RESULTS OF YOUR URINE TESTING COULD TAKE A WEEK OR MORE TO RETURN, HOWEVER.    

## 2013-11-05 ENCOUNTER — Telehealth: Payer: Self-pay | Admitting: Physical Medicine & Rehabilitation

## 2013-11-05 ENCOUNTER — Other Ambulatory Visit: Payer: Self-pay | Admitting: Family Medicine

## 2013-11-05 ENCOUNTER — Telehealth: Payer: Self-pay | Admitting: Family Medicine

## 2013-11-05 NOTE — Telephone Encounter (Signed)
Contacted patient to inform her of the message below. Patient will contact us after she is finished with assessment at [facility] for a referral to [facility].

## 2013-11-05 NOTE — Telephone Encounter (Signed)
Spine shows some scoliosis as expected and mild DDD/DJD----would like to proceed with outpt therapy----preferrably at [facility] up near [facility] once she's finished with assessment/orthotics by [facility]

## 2013-11-21 ENCOUNTER — Encounter
Payer: BC Managed Care – PPO | Attending: Physical Medicine & Rehabilitation | Admitting: Physical Medicine & Rehabilitation

## 2013-11-21 ENCOUNTER — Encounter: Payer: Self-pay | Admitting: Physical Medicine & Rehabilitation

## 2013-11-21 VITALS — BP 150/61 | HR 106 | Resp 16 | Ht 66.0 in | Wt 215.0 lb

## 2013-11-21 DIAGNOSIS — E1149 Type 2 diabetes mellitus with other diabetic neurological complication: Secondary | ICD-10-CM | POA: Diagnosis not present

## 2013-11-21 DIAGNOSIS — G8929 Other chronic pain: Secondary | ICD-10-CM | POA: Diagnosis not present

## 2013-11-21 DIAGNOSIS — M2170 Unequal limb length (acquired), unspecified site: Secondary | ICD-10-CM | POA: Diagnosis not present

## 2013-11-21 DIAGNOSIS — M961 Postlaminectomy syndrome, not elsewhere classified: Secondary | ICD-10-CM | POA: Diagnosis not present

## 2013-11-21 DIAGNOSIS — E1142 Type 2 diabetes mellitus with diabetic polyneuropathy: Secondary | ICD-10-CM | POA: Insufficient documentation

## 2013-11-21 DIAGNOSIS — Z5181 Encounter for therapeutic drug level monitoring: Secondary | ICD-10-CM | POA: Diagnosis present

## 2013-11-21 MED ORDER — OXYCODONE HCL 5 MG PO TABS: 5.0000 mg | ORAL_TABLET | Freq: Three times a day (TID) | ORAL | Status: DC | PRN

## 2013-11-21 NOTE — Progress Notes (Signed)
Subjective:    Patient ID: Laura Dalton, female    DOB: March 07, 1951, 63 y.o.   MRN: [id number]  HPI  Laura Dalton is back regarding her chronic pain. Her shoe build up took some getting used to. She is now using it regularly. Her balance is better.   Her xrays revealed the levoscoliosis and djd from thoracic to lumbar spine.   She has used hydrocodone in the past for pain control which has worked fairly well.       Pain Inventory Average Pain 9 Pain Right Now 9 My pain is constant, sharp, stabbing and aching  In the last 24 hours, has pain interfered with the following? General activity 10 Relation with others 8 Enjoyment of life 8 What TIME of day is your pain at its worst? morning,evening Sleep (in general) Fair  Pain is worse with: walking, standing and some activites Pain improves with: rest, heat/ice, pacing activities and TENS Relief from Meds: 5  Mobility walk without assistance use a cane how many minutes can you walk? 10 ability to climb steps?  yes do you drive?  yes transfers alone Do you have any goals in this area?  no  Function retired Do you have any goals in this area?  yes  Neuro/Psych weakness trouble walking spasms  Prior Studies Any changes since last visit?  yes x-rays  Physicians involved in your care Any changes since last visit?  no   Family History  Problem Relation Age of Onset  . Diabetes Mother   . Cancer Father    History   Social History  . Marital Status: Married    Spouse Name: N/A    Number of Children: N/A  . Years of Education: N/A   Occupational History  . 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 Smoking status: Former Smoker -- 0.50 packs/day for 45 years    Types: Cigarettes    Quit date: 12/01/2007  . Smokeless tobacco: Never Used  . Alcohol Use: No  . Drug Use: No  . Sexual Activity: None   Other Topics Concern  . None   Social History Narrative  . None   Past Surgical  History  Procedure Laterality Date  . Back surgery  1996    turmor on back   . Appendectomy    . Cholecystectomy    . Leg surgery Right     "clot and had drain in leg  rt lower leg   Past Medical History  Diagnosis Date  . Bursitis of hip   . Low back pain     benign turmor back  . COPD (chronic obstructive pulmonary disease)   . Hypothyroidism   . Vitamin D deficiency disease   . Hypertension   . Diabetes mellitus without complication     type II   BP 150/61  Pulse 106  Resp 16  Ht  (1.676 m)  Wt 215 lb (97.523 kg)  BMI 34.72 kg/m2  SpO2 94%  Opioid Risk Score:   Fall Risk Score: Moderate Fall Risk (6-13 points) (pt educated declined handout)    Review of Systems  Constitutional: Positive for unexpected weight change.  Musculoskeletal: Positive for back pain and gait problem.  Neurological: Positive for weakness.       Spasms  All other systems reviewed and are negative.      Objective:   Physical Exam General: Alert and oriented x 3, No apparent distress. obese  HEENT: Head is normocephalic, atraumatic, PERRLA, EOMI, sclera anicteric, oral mucosa pink and moist, dentition intact, ext ear canals clear,  Neck: Supple without JVD or lymphadenopathy  Heart: Reg rate and rhythm. No murmurs rubs or gallops  Chest: CTA bilaterally without wheezes, rales, or rhonchi; no distress  Abdomen: Soft, non-tender, non-distended, bowel sounds positive.  Extremities: No clubbing, cyanosis, or edema. Pulses are 2+  Skin: Clean and intact without signs of breakdown  Neuro: Pt is cognitively appropriate with normal insight, memory, and awareness. Cranial nerves 2-12 are intact (except for being a little HOH) Sensory exam is decreased in stocking glove distribution Reflexes are 2+ in all 4's. Fine motor coordination is intact. No tremors. Motor function is grossly 5/5.  Musculoskeletal: able to flex at the waist to almost 90 degrees. 5-10 degrees levoscoliosis in mid/upper  lumbar spine. Right hemipelvis now almost equal to the left.. Right leg measures 2cm longer than right from med mall to ASIS. Tends to talk with right foot rotated medially with less heel strike on that side. slr negative except for hamstring tightness. PSIS non-tender. FABER neg. Mild pain with ext/rotation/lateral bending. Tenderness and spasms/scar along left thoracic and lumbar paraspinals. She is wearing the built up shoe today. Right shoulder tender with ROM today. Psych: Pt's affect is appropriate. Pt is cooperative   Assessment & Plan:   1. Hx of thoracic spine surgery with excision of tumor (20 years ago)  2. Hx of ?discectomy/laminectomy at age 42  3. Left leg length discrepancy  4. Chronic left back pain and hip pain related to the above  5. Obesity  6. Diabetes with polyneuropathy   Plan:  1. Made a referral to outpt therapy to address posture, rom, HEP at [facility] in [place]. 2. Continue left shoe build up ---posture is much better 3. Robaxin prn for back spasms  4. Will add oxycodone for breakthrough pain,  one q12 prn #60 5. Reviewed appropriate posture  6. Will arrange follow up for about 8 weeks. 30 minutes of face to face patient care time were spent during this visit. All questions were encouraged and answered.

## 2013-11-21 NOTE — Patient Instructions (Signed)
WORK ON GOOD POSTURE AND REGULAR STRETCHING EVERY DAY.

## 2013-12-03 ENCOUNTER — Other Ambulatory Visit: Payer: Self-pay

## 2013-12-03 MED ORDER — AMLODIPINE BESYLATE 2.5 MG PO TABS: ORAL_TABLET | ORAL | Status: DC

## 2014-01-19 ENCOUNTER — Other Ambulatory Visit: Payer: Self-pay | Admitting: *Deleted

## 2014-01-19 MED ORDER — METFORMIN HCL 500 MG PO TABS: ORAL_TABLET | ORAL | Status: DC

## 2014-01-21 ENCOUNTER — Encounter: Payer: Self-pay | Admitting: Physical Medicine & Rehabilitation

## 2014-01-21 ENCOUNTER — Other Ambulatory Visit: Payer: Self-pay | Admitting: Physical Medicine & Rehabilitation

## 2014-01-21 ENCOUNTER — Encounter
Payer: BC Managed Care – PPO | Attending: Physical Medicine & Rehabilitation | Admitting: Physical Medicine & Rehabilitation

## 2014-01-21 VITALS — BP 142/90 | HR 72 | Resp 14 | Ht 66.0 in | Wt 215.0 lb

## 2014-01-21 DIAGNOSIS — Z5181 Encounter for therapeutic drug level monitoring: Secondary | ICD-10-CM | POA: Diagnosis not present

## 2014-01-21 DIAGNOSIS — G8929 Other chronic pain: Secondary | ICD-10-CM

## 2014-01-21 DIAGNOSIS — G894 Chronic pain syndrome: Secondary | ICD-10-CM | POA: Diagnosis not present

## 2014-01-21 DIAGNOSIS — Z79899 Other long term (current) drug therapy: Secondary | ICD-10-CM | POA: Diagnosis not present

## 2014-01-21 DIAGNOSIS — M2170 Unequal limb length (acquired), unspecified site: Secondary | ICD-10-CM | POA: Diagnosis present

## 2014-01-21 DIAGNOSIS — M19011 Primary osteoarthritis, right shoulder: Secondary | ICD-10-CM

## 2014-01-21 DIAGNOSIS — E1142 Type 2 diabetes mellitus with diabetic polyneuropathy: Secondary | ICD-10-CM | POA: Insufficient documentation

## 2014-01-21 DIAGNOSIS — M961 Postlaminectomy syndrome, not elsewhere classified: Secondary | ICD-10-CM | POA: Insufficient documentation

## 2014-01-21 DIAGNOSIS — G629 Polyneuropathy, unspecified: Secondary | ICD-10-CM | POA: Diagnosis present

## 2014-01-21 DIAGNOSIS — M549 Dorsalgia, unspecified: Secondary | ICD-10-CM

## 2014-01-21 MED ORDER — OXYCODONE HCL 5 MG PO TABS: 5.0000 mg | ORAL_TABLET | Freq: Three times a day (TID) | ORAL | Status: DC | PRN

## 2014-01-21 NOTE — Patient Instructions (Signed)
WORK ON RIGHT SHOULDER RANGE OF MOTION EXERCISES

## 2014-01-21 NOTE — Progress Notes (Signed)
Subjective:    Patient ID: Laura Dalton, female    DOB: 06/05/1950, 63 y.o.   MRN: [id number]  HPI   Laura Dalton is back regarding her chronic pain. For the most part her pain levels have been stable. 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  Her medications have been working for her. She knows when to take rest breaks. She takes her oxycodone for breakthrough pain. Robaxin works for spasms.  Her shoe lift has helped now that she's gotten used to it.        Pain Inventory Average Pain 8 Pain Right Now 4 My pain is constant, sharp and stabbing  In the last 24 hours, has pain interfered with the following? General activity 7 Relation with others 9 Enjoyment of life 10 What TIME of day is your pain at its worst? morning  And night Sleep (in general) Fair  Pain is worse with: walking and standing Pain improves with: rest, medication and TENS Relief from Meds: 6  Mobility walk without assistance walk with assistance how many minutes can you walk? 15 ability to climb steps?  yes do you drive?  yes  Function not employed: date last employed 2009  Neuro/Psych weakness trouble walking spasms  Prior Studies Any changes since last visit?  no  Physicians involved in your care Any changes since last visit?  no   Family History  Problem Relation Age of Onset  . Diabetes Mother   . Cancer Father    History   Social History  . Marital Status: Married    Spouse Name: N/A    Number of Children: N/A  . Years of Education: N/A   Occupational History  . 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 Smoking status: Former Smoker -- 0.50 packs/day for 45 years    Types: Cigarettes    Quit date: 12/01/2007  . Smokeless tobacco: Never Used  . Alcohol Use: No  . Drug Use: No  . Sexual Activity: None   Other Topics Concern  . None   Social History Narrative   Past Surgical History  Procedure Laterality Date    . Back surgery  1996    turmor on back   . Appendectomy    . Cholecystectomy    . Leg surgery Right     "clot and had drain in leg  rt lower leg   Past Medical History  Diagnosis Date  . Bursitis of hip   . Low back pain     benign turmor back  . COPD (chronic obstructive pulmonary disease)   . Hypothyroidism   . Vitamin D deficiency disease   . Hypertension   . Diabetes mellitus without complication     type II   BP 142/90 mmHg  Pulse 72  Resp 14  Ht 5\' 6"  (1.676 m)  Wt 215 lb (97.523 kg)  BMI 34.72 kg/m2  SpO2 92%  Opioid Risk Score:   Fall Risk Score: Low Fall Risk (0-5 points)  Review of Systems  HENT: Negative.   Eyes: Negative.   Respiratory: Negative.   Cardiovascular: Negative.   Gastrointestinal: Negative.   Endocrine: Negative.   Genitourinary: Negative.   Musculoskeletal: Positive for back pain.  Skin: Negative.   Allergic/Immunologic: Negative.   Neurological: Positive for weakness.  Hematological: Negative.   Psychiatric/Behavioral: Negative.        Objective:   Physical Exam  General: Alert and oriented x 3, No apparent distress. obese  HEENT: Head is normocephalic, atraumatic, PERRLA, EOMI, sclera anicteric, oral mucosa pink and moist, dentition intact, ext ear canals clear,  Neck: Supple without JVD or lymphadenopathy  Heart: Reg rate and rhythm. No murmurs rubs or gallops  Chest: CTA bilaterally without wheezes, rales, or rhonchi; no distress  Abdomen: Soft, non-tender, non-distended, bowel sounds positive.  Extremities: No clubbing, cyanosis, or edema. Pulses are 2+  Skin: Clean and intact without signs of breakdown  Neuro: Pt is cognitively appropriate with normal insight, memory, and awareness. Cranial nerves 2-12 are intact (except for being a little HOH) Sensory exam is decreased in stocking glove distribution Reflexes are 2+ in all 4's. Fine motor coordination is intact. No tremors. Motor function is grossly 5/5.  Musculoskeletal:   Right hemipelvis now almost equal to the left.. Right leg measures 2cm longer than right from med mall to ASIS. Still walks with a bit of a flexed posture. Tenderness and spasms/scar along left thoracic and lumbar paraspinals. She is wearing the built up shoe today. Right shoulder tender with ROM today. Apprehension test+, RTC impingement test + Psych: Pt's affect is appropriate. Pt is cooperative    Assessment & Plan:   1. Hx of thoracic spine surgery with excision of tumor (20 years ago)  2. Hx of ?discectomy/laminectomy at age 63  3. Left leg length discrepancy  4. Chronic left back pain and hip pain related to the above  5. Obesity  6. Diabetes with polyneuropathy 7. OA right shoulder    Plan:  1. Discussed ROM exercises for right shoulder. If the shoulder doesn't improve we can consider an injection.  2. Continue left shoe build up ---this is working well for her. 3. Robaxin prn for back spasms  4. Refill oxycodone for breakthrough pain, 5mg  one q12 prn #60  5. Her posture is better. Urged her to continue with improved posture and rom exercises.  6. Will have her  follow up in about 8 weeks. 30 minutes of face to face patient care time were spent during this visit. All questions were encouraged and answered.

## 2014-01-22 LAB — PRESCRIPTION MONITORING PROFILE (SOLSTAS)
AMPHETAMINE/METH: NEGATIVE ng/mL
Barbiturate Screen, Urine: NEGATIVE ng/mL
Benzodiazepine Screen, Urine: NEGATIVE ng/mL
Buprenorphine, Urine: NEGATIVE ng/mL
Cannabinoid Scrn, Ur: NEGATIVE ng/mL
Carisoprodol, Urine: NEGATIVE ng/mL
Cocaine Metabolites: NEGATIVE ng/mL
Creatinine, Urine: 21.5 mg/dL (ref >20.0)
FENTANYL URINE: NEGATIVE ng/mL
MDMA URINE: NEGATIVE ng/mL
Meperidine, Ur: NEGATIVE ng/mL
Methadone Screen, Urine: NEGATIVE ng/mL
NITRITES URINE, INITIAL: NEGATIVE ug/mL
Opiate Screen, Urine: NEGATIVE ng/mL
Oxycodone Screen, Ur: NEGATIVE ng/mL
PROPOXYPHENE: NEGATIVE ng/mL
TAPENTADOLUR: NEGATIVE ng/mL
Tramadol Scrn, Ur: NEGATIVE ng/mL
Zolpidem, Urine: NEGATIVE ng/mL
pH, Initial: 6.9 pH (ref 4.5–8.9)

## 2014-01-22 LAB — PMP ALCOHOL METABOLITE (ETG): ETGU: NEGATIVE ng/mL

## 2014-02-09 ENCOUNTER — Other Ambulatory Visit: Payer: Self-pay

## 2014-02-09 MED ORDER — ATORVASTATIN CALCIUM 20 MG PO TABS: 20.0000 mg | ORAL_TABLET | Freq: Every day | ORAL | Status: DC

## 2014-02-16 ENCOUNTER — Other Ambulatory Visit: Payer: Self-pay | Admitting: Family Medicine

## 2014-02-18 ENCOUNTER — Other Ambulatory Visit: Payer: Self-pay | Admitting: Physical Medicine & Rehabilitation

## 2014-03-11 ENCOUNTER — Other Ambulatory Visit: Payer: Self-pay | Admitting: Family Medicine

## 2014-03-23 ENCOUNTER — Encounter: Payer: BC Managed Care – PPO | Admitting: Physical Medicine & Rehabilitation

## 2014-04-20 ENCOUNTER — Other Ambulatory Visit: Payer: Self-pay | Admitting: Family Medicine

## 2014-04-21 NOTE — Telephone Encounter (Signed)
Last seen 09/17/13 B [place]

## 2014-04-27 ENCOUNTER — Ambulatory Visit: Payer: Self-pay | Admitting: Family

## 2014-04-30 ENCOUNTER — Encounter: Payer: Self-pay | Admitting: Family

## 2014-04-30 ENCOUNTER — Other Ambulatory Visit: Payer: Self-pay | Admitting: Family Medicine

## 2014-04-30 ENCOUNTER — Ambulatory Visit (INDEPENDENT_AMBULATORY_CARE_PROVIDER_SITE_OTHER): Payer: 59 | Admitting: Family

## 2014-04-30 VITALS — BP 144/71 | HR 84 | Temp 98.8°F | Ht 66.0 in | Wt 202.0 lb

## 2014-04-30 DIAGNOSIS — M549 Dorsalgia, unspecified: Secondary | ICD-10-CM

## 2014-04-30 DIAGNOSIS — I10 Essential (primary) hypertension: Secondary | ICD-10-CM

## 2014-04-30 DIAGNOSIS — E1142 Type 2 diabetes mellitus with diabetic polyneuropathy: Secondary | ICD-10-CM

## 2014-04-30 DIAGNOSIS — J449 Chronic obstructive pulmonary disease, unspecified: Secondary | ICD-10-CM

## 2014-04-30 DIAGNOSIS — G4700 Insomnia, unspecified: Secondary | ICD-10-CM

## 2014-04-30 DIAGNOSIS — E039 Hypothyroidism, unspecified: Secondary | ICD-10-CM

## 2014-04-30 DIAGNOSIS — E785 Hyperlipidemia, unspecified: Secondary | ICD-10-CM

## 2014-04-30 DIAGNOSIS — E559 Vitamin D deficiency, unspecified: Secondary | ICD-10-CM

## 2014-04-30 DIAGNOSIS — G8929 Other chronic pain: Secondary | ICD-10-CM

## 2014-04-30 LAB — POCT GLYCOSYLATED HEMOGLOBIN (HGB A1C): Hemoglobin A1C: 6.1

## 2014-04-30 LAB — POCT UA - MICROALBUMIN: MICROALBUMIN (UR) POC: NEGATIVE mg/L

## 2014-04-30 MED ORDER — METFORMIN HCL 500 MG PO TABS: ORAL_TABLET | ORAL | Status: DC

## 2014-04-30 MED ORDER — METHOCARBAMOL 500 MG PO TABS: ORAL_TABLET | ORAL | Status: DC

## 2014-04-30 MED ORDER — AMITRIPTYLINE HCL 100 MG PO TABS
100.0000 mg | ORAL_TABLET | Freq: Every day | ORAL | Status: DC
Start: 2014-04-30 — End: 2015-09-02

## 2014-04-30 MED ORDER — LOSARTAN POTASSIUM-HCTZ 100-25 MG PO TABS: 1.0000 | ORAL_TABLET | Freq: Every day | ORAL | Status: DC

## 2014-04-30 MED ORDER — GABAPENTIN 600 MG PO TABS: 600.0000 mg | ORAL_TABLET | Freq: Three times a day (TID) | ORAL | Status: DC

## 2014-04-30 MED ORDER — AMLODIPINE BESYLATE 2.5 MG PO TABS: 2.5000 mg | ORAL_TABLET | Freq: Every day | ORAL | Status: DC

## 2014-04-30 MED ORDER — ATORVASTATIN CALCIUM 20 MG PO TABS: 20.0000 mg | ORAL_TABLET | Freq: Every day | ORAL | Status: DC

## 2014-04-30 NOTE — Patient Instructions (Signed)

## 2014-04-30 NOTE — Progress Notes (Signed)
Subjective:    Patient ID: Laura Dalton, female    DOB: [birth year], 64 y.o.   MRN: [id number]  Diabetes She presents for her follow-up diabetic visit. She has type 2 diabetes mellitus. Her disease course has been stable. Pertinent negatives for hypoglycemia include no confusion, dizziness, headaches, mood changes or sleepiness. Pertinent negatives for diabetes include no blurred vision, no foot paresthesias, no foot ulcerations and no polyuria. Pertinent negatives for hypoglycemia complications include no blackouts, no hospitalization and no nocturnal hypoglycemia. Symptoms are stable. Diabetic complications include peripheral neuropathy. Pertinent negatives for diabetic complications include no autonomic neuropathy, CVA, heart disease or nephropathy. Risk factors for coronary artery disease include diabetes mellitus, dyslipidemia, hypertension, obesity and post-menopausal. Current diabetic treatment includes oral agent (monotherapy). She is following a generally unhealthy diet. Her breakfast blood glucose range is generally 110-130 mg/dl. An ACE inhibitor/angiotensin II receptor blocker is being taken. Eye exam is not current.  Hypertension This is a chronic problem. The current episode started more than 1 year ago. The problem has been waxing and waning since onset. The problem is uncontrolled. Pertinent negatives include no blurred vision, headaches, palpitations, peripheral edema or shortness of breath. Risk factors for coronary artery disease include diabetes mellitus, dyslipidemia, family history, obesity, post-menopausal state and sedentary lifestyle. Past treatments include angiotensin blockers and diuretics (Pt has not had medication for last week). There is no history of kidney disease, CAD/MI, CVA or heart failure. There is no history of sleep apnea.  Hyperlipidemia This is a chronic problem. The current episode started more than 1 year ago. The problem is controlled. Recent lipid tests  were reviewed and are normal. Exacerbating diseases include diabetes. She has no history of hypothyroidism. Pertinent negatives include no leg pain or shortness of breath. Current antihyperlipidemic treatment includes statins. The current treatment provides significant improvement of lipids. Risk factors for coronary artery disease include diabetes mellitus, dyslipidemia, family history, hypertension and post-menopausal.  Peripheral neuropathy Pt currently taking gabapentin 693m TID. Pt has not taken in last week and states she is having burning and pain in her feet now, but when she takes it regularly her symptoms resolve. COPD Pt states she takes her advair as needed. States she only needs it when someone has strong perfume or if she does a lot of exercise. Other wise does not need it.   *Pt states she ran out of her blood pressure medications and gabapentin over a week ago. Pt also see's a pain clinic for her oxycodone.   Review of Systems  Constitutional: Negative.   HENT: Negative.   Eyes: Negative.  Negative for blurred vision.  Respiratory: Negative.  Negative for shortness of breath.   Cardiovascular: Negative.  Negative for palpitations.  Gastrointestinal: Negative.   Endocrine: Negative.  Negative for polyuria.  Genitourinary: Negative.   Musculoskeletal: Negative.   Neurological: Negative.  Negative for dizziness and headaches.  Hematological: Negative.   Psychiatric/Behavioral: Negative.  Negative for confusion.  All other systems reviewed and are negative.      Objective:   Physical Exam  Constitutional: She is oriented to person, place, and time. She appears well-developed and well-nourished. No distress.  HENT:  Head: Normocephalic and atraumatic.  Right Ear: External ear normal.  Left Ear: External ear normal.  Nose: Nose normal.  Mouth/Throat: Oropharynx is clear and moist.  Eyes: Pupils are equal, round, and reactive to light.  Neck: Normal range of motion. Neck  supple. No thyromegaly present.  Cardiovascular: Normal rate, regular rhythm, normal  heart sounds and intact distal pulses.   No murmur heard. Pulmonary/Chest: Effort normal and breath sounds normal. No respiratory distress. She has no wheezes.  Abdominal: Soft. Bowel sounds are normal. She exhibits no distension. There is no tenderness.  Musculoskeletal: Normal range of motion. She exhibits no edema or tenderness.  Neurological: She is alert and oriented to person, place, and time. She has normal reflexes. No cranial nerve deficit.  Skin: Skin is warm and dry.  Psychiatric: She has a normal mood and affect. Her behavior is normal. Judgment and thought content normal.  Vitals reviewed.  BP 144/71 mmHg  Pulse 84  Temp(Src) 98.8 F (37.1 C) (Oral)  Ht 5' 6"  (1.676 m)  Wt 202 lb (91.627 kg)  BMI 32.62 kg/m2        Assessment & Plan:  1. Essential hypertension - CMP14+EGFR - amLODipine (NORVASC) 2.5 MG tablet; Take 1 tablet (2.5 mg total) by mouth daily.  Dispense: 90 tablet; Refill: 3 - losartan-hydrochlorothiazide (HYZAAR) 100-25 MG per tablet; Take 1 tablet by mouth daily.  Dispense: 90 tablet; Refill: 1  2. Chronic obstructive pulmonary disease, unspecified COPD, unspecified chronic bronchitis type - CMP14+EGFR  3. Vitamin D deficiency - CMP14+EGFR  4. HLD (hyperlipidemia) - CMP14+EGFR - atorvastatin (LIPITOR) 20 MG tablet; Take 1 tablet (20 mg total) by mouth daily.  Dispense: 90 tablet; Refill: 2  5. Type 2 diabetes mellitus with diabetic polyneuropathy - POCT glycosylated hemoglobin (Hb A1C) - POCT UA - Microalbumin - CMP14+EGFR - gabapentin (NEURONTIN) 600 MG tablet; Take 1 tablet (600 mg total) by mouth 3 (three) times daily.  Dispense: 270 tablet; Refill: 3 - metFORMIN (GLUCOPHAGE) 500 MG tablet; TAKE ONE TABLET BY MOUTh once DAILY  Dispense: 90 tablet; Refill: 3  6. Hypothyroidism, unspecified hypothyroidism type - CMP14+EGFR - Thyroid Panel With TSH  7.  Chronic back pain - CMP14+EGFR - methocarbamol (ROBAXIN) 500 MG tablet; TAKE ONE TABLET BY MOUTH EVERY 6 HOURS AS NEEDED FOR MUSCLE SPASM  Dispense: 60 tablet; Refill: 2  8. Insomnia - amitriptyline (ELAVIL) 100 MG tablet; Take 1 tablet (100 mg total) by mouth at bedtime.  Dispense: 90 tablet; Refill: 3   Continue all meds Labs pending Health Maintenance reviewed-hemoccult cards given to patient with directions, pt to schedule mammogram Diet and exercise encouraged RTO 3 months   Evelina Dun, FNP

## 2014-05-01 ENCOUNTER — Other Ambulatory Visit: Payer: Self-pay | Admitting: Nurse Practitioner

## 2014-05-01 LAB — CMP14+EGFR
ALT: 7 IU/L (ref 0–32)
AST: 14 IU/L (ref 0–40)
Albumin/Globulin Ratio: 1.9 (ref 1.1–2.5)
Albumin: 4.8 g/dL (ref 3.6–4.8)
Alkaline Phosphatase: 83 IU/L (ref 39–117)
BUN/Creatinine Ratio: 14 (ref 11–26)
BUN: 12 mg/dL (ref 8–27)
Bilirubin Total: <0.2 mg/dL (ref 0.0–1.2)
CO2: 27 mmol/L (ref 18–29)
Calcium: 10 mg/dL (ref 8.7–10.3)
Chloride: 98 mmol/L (ref 97–108)
Creatinine, Ser: 0.85 mg/dL (ref 0.57–1.00)
GFR calc Af Amer: 84 mL/min/1.73
GFR calc non Af Amer: 73 mL/min/1.73
Globulin, Total: 2.5 g/dL (ref 1.5–4.5)
Glucose: 117 mg/dL — ABNORMAL HIGH (ref 65–99)
Potassium: 4.4 mmol/L (ref 3.5–5.2)
Sodium: 140 mmol/L (ref 134–144)
Total Protein: 7.3 g/dL (ref 6.0–8.5)

## 2014-05-01 LAB — THYROID PANEL WITH TSH
Free Thyroxine Index: 1.3 (ref 1.2–4.9)
T3 Uptake Ratio: 20 % — ABNORMAL LOW (ref 24–39)
T4, Total: 6.3 ug/dL (ref 4.5–12.0)
TSH: 13.94 u[IU]/mL — ABNORMAL HIGH (ref 0.450–4.500)

## 2014-05-01 MED ORDER — LEVOTHYROXINE SODIUM 50 MCG PO TABS: 50.0000 ug | ORAL_TABLET | Freq: Every day | ORAL | Status: DC

## 2014-05-08 ENCOUNTER — Other Ambulatory Visit (INDEPENDENT_AMBULATORY_CARE_PROVIDER_SITE_OTHER): Payer: 59

## 2014-05-08 DIAGNOSIS — Z1212 Encounter for screening for malignant neoplasm of rectum: Secondary | ICD-10-CM

## 2014-05-08 NOTE — Progress Notes (Signed)
Lab only 

## 2014-05-10 LAB — FECAL OCCULT BLOOD, IMMUNOCHEMICAL: FECAL OCCULT BLD: NEGATIVE

## 2014-06-25 ENCOUNTER — Other Ambulatory Visit: Payer: Self-pay | Admitting: Family

## 2014-07-03 ENCOUNTER — Other Ambulatory Visit: Payer: Self-pay | Admitting: Family Medicine

## 2014-07-29 ENCOUNTER — Encounter (INDEPENDENT_AMBULATORY_CARE_PROVIDER_SITE_OTHER): Payer: Self-pay

## 2014-07-29 ENCOUNTER — Encounter: Payer: Self-pay | Admitting: Family

## 2014-07-29 ENCOUNTER — Ambulatory Visit (INDEPENDENT_AMBULATORY_CARE_PROVIDER_SITE_OTHER): Payer: 59 | Admitting: Family

## 2014-07-29 VITALS — BP 111/60 | HR 60 | Temp 97.6°F | Ht 66.0 in | Wt 202.6 lb

## 2014-07-29 DIAGNOSIS — E559 Vitamin D deficiency, unspecified: Secondary | ICD-10-CM | POA: Diagnosis not present

## 2014-07-29 DIAGNOSIS — J449 Chronic obstructive pulmonary disease, unspecified: Secondary | ICD-10-CM

## 2014-07-29 DIAGNOSIS — R079 Chest pain, unspecified: Secondary | ICD-10-CM | POA: Diagnosis not present

## 2014-07-29 DIAGNOSIS — E039 Hypothyroidism, unspecified: Secondary | ICD-10-CM | POA: Diagnosis not present

## 2014-07-29 DIAGNOSIS — I10 Essential (primary) hypertension: Secondary | ICD-10-CM

## 2014-07-29 DIAGNOSIS — E785 Hyperlipidemia, unspecified: Secondary | ICD-10-CM

## 2014-07-29 DIAGNOSIS — M549 Dorsalgia, unspecified: Secondary | ICD-10-CM | POA: Diagnosis not present

## 2014-07-29 DIAGNOSIS — G8929 Other chronic pain: Secondary | ICD-10-CM

## 2014-07-29 DIAGNOSIS — E1142 Type 2 diabetes mellitus with diabetic polyneuropathy: Secondary | ICD-10-CM

## 2014-07-29 LAB — POCT GLYCOSYLATED HEMOGLOBIN (HGB A1C): HEMOGLOBIN A1C: 6.2

## 2014-07-29 MED ORDER — METHOCARBAMOL 500 MG PO TABS: ORAL_TABLET | ORAL | Status: DC
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
## 2014-07-29 NOTE — Patient Instructions (Signed)

## 2014-07-30 LAB — CMP14+EGFR
A/G RATIO: 1.9 (ref 1.1–2.5)
ALK PHOS: 64 IU/L (ref 39–117)
ALT: 7 IU/L (ref 0–32)
AST: 15 IU/L (ref 0–40)
Albumin: 4.5 g/dL (ref 3.6–4.8)
BUN/Creatinine Ratio: 16 (ref 11–26)
BUN: 12 mg/dL (ref 8–27)
Bilirubin Total: <0.2 mg/dL (ref 0.0–1.2)
CALCIUM: 9.9 mg/dL (ref 8.7–10.3)
CO2: 30 mmol/L — AB (ref 18–29)
Chloride: 97 mmol/L (ref 97–108)
Creatinine, Ser: 0.73 mg/dL (ref 0.57–1.00)
GFR calc Af Amer: 101 mL/min/{1.73_m2} (ref >59)
GFR calc non Af Amer: 87 mL/min/{1.73_m2} (ref >59)
GLOBULIN, TOTAL: 2.4 g/dL (ref 1.5–4.5)
Glucose: 123 mg/dL — ABNORMAL HIGH (ref 65–99)
POTASSIUM: 4.1 mmol/L (ref 3.5–5.2)
Sodium: 140 mmol/L (ref 134–144)
Total Protein: 6.9 g/dL (ref 6.0–8.5)

## 2014-07-30 LAB — LIPID PANEL
CHOLESTEROL TOTAL: 142 mg/dL (ref 100–199)
Chol/HDL Ratio: 3.6 ratio units (ref 0.0–4.4)
HDL: 39 mg/dL — AB (ref >39)
LDL Calculated: 74 mg/dL (ref 0–99)
Triglycerides: 144 mg/dL (ref 0–149)
VLDL CHOLESTEROL CAL: 29 mg/dL (ref 5–40)

## 2014-07-30 LAB — THYROID PANEL WITH TSH
FREE THYROXINE INDEX: 1.8 (ref 1.2–4.9)
T3 UPTAKE RATIO: 23 % — AB (ref 24–39)
T4, Total: 7.9 ug/dL (ref 4.5–12.0)
TSH: 2.39 u[IU]/mL (ref 0.450–4.500)

## 2014-07-30 LAB — VITAMIN D 25 HYDROXY (VIT D DEFICIENCY, FRACTURES): Vit D, 25-Hydroxy: 20.4 ng/mL — ABNORMAL LOW (ref 30.0–100.0)

## 2014-07-31 ENCOUNTER — Other Ambulatory Visit: Payer: Self-pay | Admitting: Family

## 2014-07-31 DIAGNOSIS — R9431 Abnormal electrocardiogram [ECG] [EKG]: Secondary | ICD-10-CM

## 2014-07-31 MED ORDER — VITAMIN D (ERGOCALCIFEROL) 1.25 MG (50000 UNIT) PO CAPS: 50000.0000 [IU] | ORAL_CAPSULE | ORAL | Status: DC

## 2014-08-03 ENCOUNTER — Telehealth: Payer: Self-pay | Admitting: Family

## 2014-08-03 NOTE — Telephone Encounter (Signed)
Patient aware of lab results.  She would like for referral to try to get her in with same person her husband goes to at cardiology.  Leamon ArntLorrie Gearhart PA at [address] location.  Her husband has an appointment in June and she would like to be seen on same day as him , if possible.

## 2014-09-01 ENCOUNTER — Encounter: Payer: Self-pay | Admitting: *Deleted

## 2014-09-02 ENCOUNTER — Encounter: Payer: Self-pay | Admitting: Cardiology

## 2014-09-02 ENCOUNTER — Ambulatory Visit (INDEPENDENT_AMBULATORY_CARE_PROVIDER_SITE_OTHER): Payer: PPO | Admitting: Cardiology

## 2014-09-02 VITALS — BP 119/66 | HR 76 | Ht 66.0 in | Wt 206.0 lb

## 2014-09-02 DIAGNOSIS — R0789 Other chest pain: Secondary | ICD-10-CM

## 2014-09-02 DIAGNOSIS — E785 Hyperlipidemia, unspecified: Secondary | ICD-10-CM | POA: Diagnosis not present

## 2014-09-02 DIAGNOSIS — I10 Essential (primary) hypertension: Secondary | ICD-10-CM | POA: Diagnosis not present

## 2014-09-02 MED ORDER — ATORVASTATIN CALCIUM 40 MG PO TABS: 40.0000 mg | ORAL_TABLET | Freq: Every day | ORAL | Status: DC

## 2014-09-02 NOTE — Progress Notes (Signed)
Clinical Summary [name] is a 64 y.o.female seen today for follow up of the following medical problems.   1. Chest pain - patient seen previously for episodes of chest pain. She has an abnormal EKG at baseline, and completed an echo and stress echo that were overall unremarkable at that time. - seen in f/u today, partly due to recent pcp appointment with continued abnormal baseline EKG. - she denies any recent cardiac symptoms.   2. HTN - compliant with meds - does not check at home  3. HL - TC 142 TG 144 HDL 39 LDL 74 - compliant with statin   Past Medical History  Diagnosis Date  . Bursitis of hip   . Low back pain     benign turmor back  . COPD (chronic obstructive pulmonary disease)   . Hypothyroidism   . Vitamin D deficiency disease   . Hypertension   . Diabetes mellitus without complication     type II     Allergies  Allergen Reactions  . Aspirin Hives     Current Outpatient Prescriptions  Medication Sig Dispense Refill  . amitriptyline (ELAVIL) 100 MG tablet Take 1 tablet (100 mg total) by mouth at bedtime. 90 tablet 3  . amLODipine (NORVASC) 2.5 MG tablet Take 1 tablet (2.5 mg total) by mouth daily. 90 tablet 3  . atorvastatin (LIPITOR) 20 MG tablet Take 1 tablet (20 mg total) by mouth daily. 90 tablet 2  . Fluticasone-Salmeterol (ADVAIR DISKUS) 250-50 MCG/DOSE AEPB Inhale 1 puff into the lungs as needed.     . gabapentin (NEURONTIN) 600 MG tablet Take 1 tablet (600 mg total) by mouth 3 (three) times daily. 270 tablet 3  . glucose blood test strip One Touch Ultra Test Strips Use to check blood glucose once a day Dx:  250.00 50 each 2  . levothyroxine (SYNTHROID, LEVOTHROID) 50 MCG tablet Take 1 tablet (50 mcg total) by mouth daily. 30 tablet 5  . losartan-hydrochlorothiazide (HYZAAR) 100-25 MG per tablet Take 1 tablet by mouth daily. 90 tablet 1  . meloxicam (MOBIC) 15 MG tablet TAKE ONE TABLET BY MOUTH ONCE DAILY 90 tablet 0  . metFORMIN  (GLUCOPHAGE) 500 MG tablet TAKE ONE TABLET BY MOUTh once DAILY 90 tablet 3  . methocarbamol (ROBAXIN) 500 MG tablet TAKE ONE TABLET BY MOUTH EVERY 6 HOURS AS NEEDED FOR MUSCLE SPASM 60 tablet 2  . oxyCODONE (OXY IR/ROXICODONE) 5 MG immediate release tablet Take 1 tablet (5 mg total) by mouth every 8 (eight) hours as needed for severe pain. 60 tablet 0  . Vitamin D, Ergocalciferol, (DRISDOL) 50000 UNITS CAPS capsule Take 1 capsule (50,000 Units total) by mouth every 7 (seven) days. 12 capsule 3   No current facility-administered medications for this visit.     Past Surgical History  Procedure Laterality Date  . Back surgery  1996    turmor on back   . Appendectomy    . Cholecystectomy    . Leg surgery Right     "clot and had drain in leg  rt lower leg     Allergies  Allergen Reactions  . Aspirin Hives      Family History  Problem Relation Age of Onset  . Diabetes Mother   . Cancer Father      Social History Laura Dalton reports that she quit smoking about 6 years ago. Her smoking use included Cigarettes. She has a 22.5 pack-year smoking history. She has never used smokeless tobacco.  Laura Dalton reports that she does not drink alcohol.   Review of Systems CONSTITUTIONAL: No weight loss, fever, chills, weakness or fatigue.  HEENT: Eyes: No visual loss, blurred vision, double vision or yellow sclerae.No hearing loss, sneezing, congestion, runny nose or sore throat.  SKIN: No rash or itching.  CARDIOVASCULAR: per HPI RESPIRATORY: No shortness of breath, cough or sputum.  GASTROINTESTINAL: No anorexia, nausea, vomiting or diarrhea. No abdominal pain or blood.  GENITOURINARY: No burning on urination, no polyuria NEUROLOGICAL: No headache, dizziness, syncope, paralysis, ataxia, numbness or tingling in the extremities. No change in bowel or bladder control.  MUSCULOSKELETAL: No muscle, back pain, joint pain or stiffness.  LYMPHATICS: No enlarged nodes. No history of splenectomy.    PSYCHIATRIC: No history of depression or anxiety.  ENDOCRINOLOGIC: No reports of sweating, cold or heat intolerance. No polyuria or polydipsia.  Marland Kitchen   Physical Examination Filed Vitals:   09/02/14 1020  BP: 119/66  Pulse: 76   Filed Vitals:   09/02/14 1020  Height: 5\' 6"  (1.676 m)  Weight: 206 lb (93.441 kg)    Gen: resting comfortably, no acute distress HEENT: no scleral icterus, pupils equal round and reactive, no palptable cervical adenopathy,  CV Resp: Clear to auscultation bilaterally GI: abdomen is soft, non-tender, non-distended, normal bowel sounds, no hepatosplenomegaly MSK: extremities are warm, no edema.  Skin: warm, no rash Neuro:  no focal deficits Psych: appropriate affect   Diagnostic Studies 01/06/13 EKG: sinus rhythm, poor R-wave progression, non-specific ST/T changes  01/2013 echo Study Conclusions  - Left ventricle: The cavity size was normal. Wall thickness was increased in a pattern of mild LVH. Systolic function was normal. The estimated ejection fraction was in the range of 60% to 65%. Wall motion was normal; there were no regional wall motion abnormalities. Doppler parameters are consistent with abnormal left ventricular relaxation (grade 1 diastolic dysfunction). - Aortic valve: Trileaflet; mildly thickened leaflets. No significant regurgitation. Mean gradient: 46mm Hg (S). - Mitral valve: Mildly thickened leaflets . Trivial regurgitation. - Right atrium: Central venous pressure: 31mm Hg (est). - Atrial septum: No defect or patent foramen ovale was identified. - Tricuspid valve: Physiologic regurgitation. - Pulmonary arteries: PA peak pressure: 41mm Hg (S). - Pericardium, extracardiac: There was no pericardial effusion. Impressions:  - No prior study for comparison. Mild LVH with LVEF 60-65%, grade 1 diastolic dysfunction. Mildly thickened aortic and mitral valves, nosignificant regurgitant lesions. PASP 30 mm  mercury. No pericardial effusion.  01/2013 Stress echo Study Conclusions  - Stress ECG conclusions: The stress ECG was negative for ischemia. - Staged echo: There was no echocardiographic evidence for stress-induced ischemia.  Assessment and Plan   1. Chest pain/Abnormal EKG - no current symptoms, previously normal workup including echo and stress echo - continue risk factor modification. Hx of DM2, not on ASA due to allergy  2. HTN - at goal, continue current meds  3. Hyperlipidemia - given her history of DM2 will change to moderate strength statin, increase atorva to 40mg  daily   F/u 1 year      Antoine Poche, M.D.

## 2014-09-02 NOTE — Patient Instructions (Signed)
Your physician wants you to follow-up in: 1 YEAR WITH DR. BRANCH You will receive a reminder letter in the mail two months in advance. If you don't receive a letter, please call our office to schedule the follow-up appointment.  Your physician has recommended you make the following change in your medication:   INCREASE ATORVASTATIN 40 MG DAILY WE HAVE SENT A 90 DAY SUPPLY  CONTINUE ALL OTHER MEDICATIONS AS DIRECTED  Thank you for choosing [facility]!!

## 2014-10-01 ENCOUNTER — Other Ambulatory Visit: Payer: Self-pay | Admitting: Family

## 2014-10-24 ENCOUNTER — Other Ambulatory Visit: Payer: Self-pay | Admitting: Family

## 2014-10-30 ENCOUNTER — Ambulatory Visit: Payer: 59 | Admitting: Family

## 2014-11-02 ENCOUNTER — Encounter: Payer: Self-pay | Admitting: Family

## 2014-11-04 DIAGNOSIS — Z029 Encounter for administrative examinations, unspecified: Secondary | ICD-10-CM

## 2014-11-05 ENCOUNTER — Other Ambulatory Visit: Payer: Self-pay | Admitting: Nurse Practitioner

## 2015-01-03 ENCOUNTER — Other Ambulatory Visit: Payer: Self-pay | Admitting: Family Medicine

## 2015-01-04 NOTE — Telephone Encounter (Signed)
Last seen 07/29/14  [facility]

## 2015-01-25 ENCOUNTER — Other Ambulatory Visit: Payer: Self-pay | Admitting: Family

## 2015-03-12 ENCOUNTER — Ambulatory Visit (INDEPENDENT_AMBULATORY_CARE_PROVIDER_SITE_OTHER): Payer: PPO | Admitting: Family

## 2015-03-12 ENCOUNTER — Encounter: Payer: Self-pay | Admitting: Family

## 2015-03-12 VITALS — BP 128/72 | HR 66 | Temp 98.2°F | Ht 66.0 in | Wt 197.0 lb

## 2015-03-12 DIAGNOSIS — E66811 Obesity, class 1: Secondary | ICD-10-CM

## 2015-03-12 DIAGNOSIS — I10 Essential (primary) hypertension: Secondary | ICD-10-CM | POA: Diagnosis not present

## 2015-03-12 DIAGNOSIS — M961 Postlaminectomy syndrome, not elsewhere classified: Secondary | ICD-10-CM | POA: Diagnosis not present

## 2015-03-12 DIAGNOSIS — E785 Hyperlipidemia, unspecified: Secondary | ICD-10-CM | POA: Diagnosis not present

## 2015-03-12 DIAGNOSIS — Z1159 Encounter for screening for other viral diseases: Secondary | ICD-10-CM

## 2015-03-12 DIAGNOSIS — M19011 Primary osteoarthritis, right shoulder: Secondary | ICD-10-CM

## 2015-03-12 DIAGNOSIS — E039 Hypothyroidism, unspecified: Secondary | ICD-10-CM | POA: Diagnosis not present

## 2015-03-12 DIAGNOSIS — M549 Dorsalgia, unspecified: Secondary | ICD-10-CM

## 2015-03-12 DIAGNOSIS — E559 Vitamin D deficiency, unspecified: Secondary | ICD-10-CM

## 2015-03-12 DIAGNOSIS — J449 Chronic obstructive pulmonary disease, unspecified: Secondary | ICD-10-CM | POA: Diagnosis not present

## 2015-03-12 DIAGNOSIS — E1142 Type 2 diabetes mellitus with diabetic polyneuropathy: Secondary | ICD-10-CM

## 2015-03-12 DIAGNOSIS — E669 Obesity, unspecified: Secondary | ICD-10-CM | POA: Diagnosis not present

## 2015-03-12 DIAGNOSIS — G8929 Other chronic pain: Secondary | ICD-10-CM

## 2015-03-12 LAB — POCT GLYCOSYLATED HEMOGLOBIN (HGB A1C): HEMOGLOBIN A1C: 6.3

## 2015-03-12 MED ORDER — ATORVASTATIN CALCIUM 40 MG PO TABS: 40.0000 mg | ORAL_TABLET | Freq: Every day | ORAL | Status: DC

## 2015-03-12 MED ORDER — LOSARTAN POTASSIUM-HCTZ 100-25 MG PO TABS: 1.0000 | ORAL_TABLET | Freq: Every day | ORAL | Status: DC

## 2015-03-12 NOTE — Patient Instructions (Signed)
Health Maintenance, Female Adopting a healthy lifestyle and getting preventive care can go a long way to promote health and wellness. Talk with your health care provider about what schedule of regular examinations is right for you. This is a good chance for you to check in with your provider about disease prevention and staying healthy. In between checkups, there are plenty of things you can do on your own. Experts have done a lot of research about which lifestyle changes and preventive measures are most likely to keep you healthy. Ask your health care provider for more information. WEIGHT AND DIET  Eat a healthy diet  Be sure to include plenty of vegetables, fruits, low-fat dairy products, and lean protein.  Do not eat a lot of foods high in solid fats, added sugars, or salt.  Get regular exercise. This is one of the most important things you can do for your health.  Most adults should exercise for at least 150 minutes each week. The exercise should increase your heart rate and make you sweat (moderate-intensity exercise).  Most adults should also do strengthening exercises at least twice a week. This is in addition to the moderate-intensity exercise.  Maintain a healthy weight  Body mass index (BMI) is a measurement that can be used to identify possible weight problems. It estimates body fat based on height and weight. Your health care provider can help determine your BMI and help you achieve or maintain a healthy weight.  For females 20 years of age and older:   A BMI below 18.5 is considered underweight.  A BMI of 18.5 to 24.9 is normal.  A BMI of 25 to 29.9 is considered overweight.  A BMI of 30 and above is considered obese.  Watch levels of cholesterol and blood lipids  You should start having your blood tested for lipids and cholesterol at 65 years of age, then have this test every 5 years.  You may need to have your cholesterol levels checked more often if:  Your lipid  or cholesterol levels are high.  You are older than 65 years of age.  You are at high risk for heart disease.  CANCER SCREENING   Lung Cancer  Lung cancer screening is recommended for adults 55-80 years old who are at high risk for lung cancer because of a history of smoking.  A yearly low-dose CT scan of the lungs is recommended for people who:  Currently smoke.  Have quit within the past 15 years.  Have at least a 30-pack-year history of smoking. A pack year is smoking an average of one pack of cigarettes a day for 1 year.  Yearly screening should continue until it has been 15 years since you quit.  Yearly screening should stop if you develop a health problem that would prevent you from having lung cancer treatment.  Breast Cancer  Practice breast self-awareness. This means understanding how your breasts normally appear and feel.  It also means doing regular breast self-exams. Let your health care provider know about any changes, no matter how small.  If you are in your 20s or 30s, you should have a clinical breast exam (CBE) by a health care provider every 1-3 years as part of a regular health exam.  If you are 40 or older, have a CBE every year. Also consider having a breast X-ray (mammogram) every year.  If you have a family history of breast cancer, talk to your health care provider about genetic screening.  If you   are at high risk for breast cancer, talk to your health care provider about having an MRI and a mammogram every year.  Breast cancer gene (BRCA) assessment is recommended for women who have family members with BRCA-related cancers. BRCA-related cancers include:  Breast.  Ovarian.  Tubal.  Peritoneal cancers.  Results of the assessment will determine the need for genetic counseling and BRCA1 and BRCA2 testing. Cervical Cancer Your health care provider may recommend that you be screened regularly for cancer of the pelvic organs (ovaries, uterus, and  vagina). This screening involves a pelvic examination, including checking for microscopic changes to the surface of your cervix (Pap test). You may be encouraged to have this screening done every 3 years, beginning at age 21.  For women ages 30-65, health care providers may recommend pelvic exams and Pap testing every 3 years, or they may recommend the Pap and pelvic exam, combined with testing for human papilloma virus (HPV), every 5 years. Some types of HPV increase your risk of cervical cancer. Testing for HPV may also be done on women of any age with unclear Pap test results.  Other health care providers may not recommend any screening for nonpregnant women who are considered low risk for pelvic cancer and who do not have symptoms. Ask your health care provider if a screening pelvic exam is right for you.  If you have had past treatment for cervical cancer or a condition that could lead to cancer, you need Pap tests and screening for cancer for at least 20 years after your treatment. If Pap tests have been discontinued, your risk factors (such as having a new sexual partner) need to be reassessed to determine if screening should resume. Some women have medical problems that increase the chance of getting cervical cancer. In these cases, your health care provider may recommend more frequent screening and Pap tests. Colorectal Cancer  This type of cancer can be detected and often prevented.  Routine colorectal cancer screening usually begins at 65 years of age and continues through 65 years of age.  Your health care provider may recommend screening at an earlier age if you have risk factors for colon cancer.  Your health care provider may also recommend using home test kits to check for hidden blood in the stool.  A small camera at the end of a tube can be used to examine your colon directly (sigmoidoscopy or colonoscopy). This is done to check for the earliest forms of colorectal  cancer.  Routine screening usually begins at age 50.  Direct examination of the colon should be repeated every 5-10 years through 65 years of age. However, you may need to be screened more often if early forms of precancerous polyps or small growths are found. Skin Cancer  Check your skin from head to toe regularly.  Tell your health care provider about any new moles or changes in moles, especially if there is a change in a mole's shape or color.  Also tell your health care provider if you have a mole that is larger than the size of a pencil eraser.  Always use sunscreen. Apply sunscreen liberally and repeatedly throughout the day.  Protect yourself by wearing long sleeves, pants, a wide-brimmed hat, and sunglasses whenever you are outside. HEART DISEASE, DIABETES, AND HIGH BLOOD PRESSURE   High blood pressure causes heart disease and increases the risk of stroke. High blood pressure is more likely to develop in:  People who have blood pressure in the high end   of the normal range (130-139/85-89 mm Hg).  People who are overweight or obese.  People who are African American.  If you are 38-23 years of age, have your blood pressure checked every 3-5 years. If you are 61 years of age or older, have your blood pressure checked every year. You should have your blood pressure measured twice--once when you are at a hospital or clinic, and once when you are not at a hospital or clinic. Record the average of the two measurements. To check your blood pressure when you are not at a hospital or clinic, you can use:  An automated blood pressure machine at a pharmacy.  A home blood pressure monitor.  If you are between 45 years and 39 years old, ask your health care provider if you should take aspirin to prevent strokes.  Have regular diabetes screenings. This involves taking a blood sample to check your fasting blood sugar level.  If you are at a normal weight and have a low risk for diabetes,  have this test once every three years after 65 years of age.  If you are overweight and have a high risk for diabetes, consider being tested at a younger age or more often. PREVENTING INFECTION  Hepatitis B  If you have a higher risk for hepatitis B, you should be screened for this virus. You are considered at high risk for hepatitis B if:  You were born in a country where hepatitis B is common. Ask your health care provider which countries are considered high risk.  Your parents were born in a high-risk country, and you have not been immunized against hepatitis B (hepatitis B vaccine).  You have HIV or AIDS.  You use needles to inject street drugs.  You live with someone who has hepatitis B.  You have had sex with someone who has hepatitis B.  You get hemodialysis treatment.  You take certain medicines for conditions, including cancer, organ transplantation, and autoimmune conditions. Hepatitis C  Blood testing is recommended for:  Everyone born from [birth year] through 1966.  Anyone with known risk factors for hepatitis C. Sexually transmitted infections (STIs)  You should be screened for sexually transmitted infections (STIs) including gonorrhea and chlamydia if:  You are sexually active and are younger than 65 years of age.  You are older than 65 years of age and your health care provider tells you that you are at risk for this type of infection.  Your sexual activity has changed since you were last screened and you are at an increased risk for chlamydia or gonorrhea. Ask your health care provider if you are at risk.  If you do not have HIV, but are at risk, it may be recommended that you take a prescription medicine daily to prevent HIV infection. This is called pre-exposure prophylaxis (PrEP). You are considered at risk if:  You are sexually active and do not regularly use condoms or know the HIV status of your partner(s).  You take drugs by injection.  You are sexually  active with a partner who has HIV. Talk with your health care provider about whether you are at high risk of being infected with HIV. If you choose to begin PrEP, you should first be tested for HIV. You should then be tested every 3 months for as long as you are taking PrEP.  PREGNANCY   If you are premenopausal and you may become pregnant, ask your health care provider about preconception counseling.  If you may  become pregnant, take 400 to 800 micrograms (mcg) of folic acid every day.  If you want to prevent pregnancy, talk to your health care provider about birth control (contraception). OSTEOPOROSIS AND MENOPAUSE   Osteoporosis is a disease in which the bones lose minerals and strength with aging. This can result in serious bone fractures. Your risk for osteoporosis can be identified using a bone density scan.  If you are 61 years of age or older, or if you are at risk for osteoporosis and fractures, ask your health care provider if you should be screened.  Ask your health care provider whether you should take a calcium or vitamin D supplement to lower your risk for osteoporosis.  Menopause may have certain physical symptoms and risks.  Hormone replacement therapy may reduce some of these symptoms and risks. Talk to your health care provider about whether hormone replacement therapy is right for you.  HOME CARE INSTRUCTIONS   Schedule regular health, dental, and eye exams.  Stay current with your immunizations.   Do not use any tobacco products including cigarettes, chewing tobacco, or electronic cigarettes.  If you are pregnant, do not drink alcohol.  If you are breastfeeding, limit how much and how often you drink alcohol.  Limit alcohol intake to no more than 1 drink per day for nonpregnant women. One drink equals 12 ounces of beer, 5 ounces of wine, or 1 ounces of hard liquor.  Do not use street drugs.  Do not share needles.  Ask your health care provider for help if  you need support or information about quitting drugs.  Tell your health care provider if you often feel depressed.  Tell your health care provider if you have ever been abused or do not feel safe at home.   This information is not intended to replace advice given to you by your health care provider. Make sure you discuss any questions you have with your health care provider.   Document Released: 09/12/2010 Document Revised: 03/20/2014 Document Reviewed: 01/29/2013 Elsevier Interactive Patient Education [employer].

## 2015-03-12 NOTE — Progress Notes (Signed)
Subjective:    Patient ID: Laura Dalton, female    DOB: 09/10/1950, 64 y.o.   MRN: [id number]  Pt presents to the office today for chronic follow up. PT states she lost her husband Feb 11 2015 and is trying to deal with this. Pt is tearful and states she's "trying to hold it together".  Diabetes She presents for her follow-up diabetic visit. She has type 2 diabetes mellitus. Her disease course has been stable. Hypoglycemia symptoms include nervousness/anxiousness. Pertinent negatives for hypoglycemia include no confusion, dizziness, headaches, mood changes or sleepiness. Associated symptoms include foot paresthesias ("burning"). Pertinent negatives for diabetes include no blurred vision, no fatigue, no foot ulcerations and no polyuria. Pertinent negatives for hypoglycemia complications include no blackouts, no hospitalization and no nocturnal hypoglycemia. Symptoms are stable. Diabetic complications include peripheral neuropathy. Pertinent negatives for diabetic complications include no autonomic neuropathy, CVA, heart disease or nephropathy. Risk factors for coronary artery disease include diabetes mellitus, dyslipidemia, hypertension, obesity and post-menopausal. Current diabetic treatment includes oral agent (monotherapy). She is following a generally unhealthy diet. Her breakfast blood glucose range is generally 110-130 mg/dl. An ACE inhibitor/angiotensin II receptor blocker is being taken. Eye exam is not current.  Hypertension This is a chronic problem. The current episode started more than 1 year ago. The problem has been resolved since onset. The problem is controlled. Pertinent negatives include no blurred vision, headaches, palpitations, peripheral edema or shortness of breath. Risk factors for coronary artery disease include diabetes mellitus, dyslipidemia, family history, obesity, post-menopausal state and sedentary lifestyle. Past treatments include angiotensin blockers and diuretics.  Hypertensive end-organ damage includes a thyroid problem. There is no history of kidney disease, CAD/MI, CVA or heart failure. There is no history of sleep apnea.  Hyperlipidemia This is a chronic problem. The current episode started more than 1 year ago. The problem is controlled. Recent lipid tests were reviewed and are normal. Exacerbating diseases include diabetes. She has no history of hypothyroidism. Pertinent negatives include no leg pain or shortness of breath. Current antihyperlipidemic treatment includes statins. The current treatment provides significant improvement of lipids. Risk factors for coronary artery disease include diabetes mellitus, dyslipidemia, family history, hypertension and post-menopausal.  Thyroid Problem Presents for follow-up visit. Symptoms include anxiety and hoarse voice. Patient reports no constipation, diarrhea, fatigue or palpitations. The symptoms have been stable. Past treatments include levothyroxine. The treatment provided moderate relief. Her past medical history is significant for diabetes and hyperlipidemia. There is no history of heart failure.      Review of Systems  Constitutional: Negative.  Negative for fatigue.  HENT: Positive for hoarse voice.   Eyes: Negative.  Negative for blurred vision.  Respiratory: Negative.  Negative for shortness of breath.   Cardiovascular: Negative.  Negative for palpitations.  Gastrointestinal: Negative.  Negative for diarrhea and constipation.  Endocrine: Negative.  Negative for polyuria.  Genitourinary: Negative.   Musculoskeletal: Negative.   Neurological: Negative.  Negative for dizziness and headaches.  Hematological: Negative.   Psychiatric/Behavioral: Negative for confusion. The patient is nervous/anxious.   All other systems reviewed and are negative.      Objective:   Physical Exam  Constitutional: She is oriented to person, place, and time. She appears well-developed and well-nourished. No distress.    HENT:  Head: Normocephalic and atraumatic.  Right Ear: External ear normal.  Left Ear: External ear normal.  Nose: Nose normal.  Mouth/Throat: Oropharynx is clear and moist.  Eyes: Pupils are equal, round, and reactive to light.  Neck: Normal range of motion. Neck supple. No thyromegaly present.  Cardiovascular: Normal rate, regular rhythm, normal heart sounds and intact distal pulses.   No murmur heard. Pulmonary/Chest: Effort normal and breath sounds normal. No respiratory distress. She has no wheezes.  Abdominal: Soft. Bowel sounds are normal. She exhibits no distension. There is no tenderness.  Musculoskeletal: Normal range of motion. She exhibits no edema or tenderness.  Neurological: She is alert and oriented to person, place, and time. She has normal reflexes. No cranial nerve deficit.  Skin: Skin is warm and dry.  Psychiatric: She has a normal mood and affect. Her behavior is normal. Judgment and thought content normal.  Vitals reviewed.    BP 128/72 mmHg  Pulse 66  Temp(Src) 98.2 F (36.8 C) (Oral)  Ht 5' 6"  (1.676 m)  Wt 197 lb (89.359 kg)  BMI 31.81 kg/m2      Assessment & Plan:  1. Essential hypertension - CMP14+EGFR - losartan-hydrochlorothiazide (HYZAAR) 100-25 MG tablet; Take 1 tablet by mouth daily.  Dispense: 90 tablet; Refill: 2  2. Chronic obstructive pulmonary disease, unspecified COPD type ([place]) - CMP14+EGFR  3. Hypothyroidism, unspecified hypothyroidism type  - CMP14+EGFR - Thyroid Panel With TSH  4. Type 2 diabetes mellitus with diabetic polyneuropathy, without long-term current use of insulin ([facility]) - POCT glycosylated hemoglobin (Hb A1C) - CMP14+EGFR - Microalbumin / creatinine urine ratio - Ambulatory referral to Ophthalmology  5. Primary osteoarthritis of right shoulder  - CMP14+EGFR  6. Vitamin D deficiency - CMP14+EGFR  7. Lumbar post-laminectomy syndrome  - CMP14+EGFR  8. HLD (hyperlipidemia)  - CMP14+EGFR - Lipid panel -  atorvastatin (LIPITOR) 40 MG tablet; Take 1 tablet (40 mg total) by mouth daily.  Dispense: 90 tablet; Refill: 1  9. Chronic back pain  - CMP14+EGFR  10. Need for hepatitis C screening test  - CMP14+EGFR - Hepatitis C antibody  11. Obesity (BMI 30.0-34.9)    Continue all meds Labs pending Health Maintenance reviewed Diet and exercise encouraged RTO 6 months  Evelina Dun, FNP

## 2015-03-13 LAB — CMP14+EGFR
ALBUMIN: 4.8 g/dL (ref 3.6–4.8)
ALK PHOS: 69 IU/L (ref 39–117)
ALT: 11 IU/L (ref 0–32)
AST: 16 IU/L (ref 0–40)
Albumin/Globulin Ratio: 1.8 (ref 1.1–2.5)
BUN / CREAT RATIO: 17 (ref 11–26)
BUN: 13 mg/dL (ref 8–27)
Bilirubin Total: 0.4 mg/dL (ref 0.0–1.2)
CO2: 28 mmol/L (ref 18–29)
CREATININE: 0.78 mg/dL (ref 0.57–1.00)
Calcium: 10.5 mg/dL — ABNORMAL HIGH (ref 8.7–10.3)
Chloride: 96 mmol/L (ref 96–106)
GFR calc non Af Amer: 80 mL/min/{1.73_m2} (ref >59)
GFR, EST AFRICAN AMERICAN: 92 mL/min/{1.73_m2} (ref >59)
GLOBULIN, TOTAL: 2.7 g/dL (ref 1.5–4.5)
Glucose: 112 mg/dL — ABNORMAL HIGH (ref 65–99)
Potassium: 4.7 mmol/L (ref 3.5–5.2)
SODIUM: 139 mmol/L (ref 134–144)
TOTAL PROTEIN: 7.5 g/dL (ref 6.0–8.5)

## 2015-03-13 LAB — MICROALBUMIN / CREATININE URINE RATIO
CREATININE, UR: 23 mg/dL
MICROALB/CREAT RATIO: <13 mg/g creat (ref 0.0–30.0)
Microalbumin, Urine: <3 ug/mL

## 2015-03-13 LAB — LIPID PANEL
CHOL/HDL RATIO: 3.5 ratio (ref 0.0–4.4)
CHOLESTEROL TOTAL: 145 mg/dL (ref 100–199)
HDL: 41 mg/dL (ref >39)
LDL CALC: 77 mg/dL (ref 0–99)
Triglycerides: 136 mg/dL (ref 0–149)
VLDL Cholesterol Cal: 27 mg/dL (ref 5–40)

## 2015-03-13 LAB — THYROID PANEL WITH TSH
Free Thyroxine Index: 1.8 (ref 1.2–4.9)
T3 Uptake Ratio: 20 % — ABNORMAL LOW (ref 24–39)
T4 TOTAL: 9 ug/dL (ref 4.5–12.0)
TSH: 5.84 u[IU]/mL — AB (ref 0.450–4.500)

## 2015-03-13 LAB — HEPATITIS C ANTIBODY

## 2015-03-17 ENCOUNTER — Other Ambulatory Visit: Payer: Self-pay | Admitting: Family

## 2015-03-17 MED ORDER — LEVOTHYROXINE SODIUM 75 MCG PO TABS: 75.0000 ug | ORAL_TABLET | Freq: Every day | ORAL | Status: DC

## 2015-03-18 ENCOUNTER — Telehealth: Payer: Self-pay | Admitting: Family

## 2015-03-23 NOTE — Telephone Encounter (Signed)
Detailed message left for patient that if she still needs us to call us back. This encounter will be closed.

## 2015-03-25 ENCOUNTER — Telehealth: Payer: Self-pay | Admitting: Family

## 2015-03-25 NOTE — Telephone Encounter (Signed)
SCHEDULED

## 2015-04-02 ENCOUNTER — Encounter: Payer: Self-pay | Admitting: Family Medicine

## 2015-04-02 ENCOUNTER — Ambulatory Visit (INDEPENDENT_AMBULATORY_CARE_PROVIDER_SITE_OTHER): Payer: PPO | Admitting: Family Medicine

## 2015-04-02 VITALS — BP 112/70 | HR 75 | Temp 97.9°F | Ht 66.0 in | Wt 195.6 lb

## 2015-04-02 DIAGNOSIS — R350 Frequency of micturition: Secondary | ICD-10-CM | POA: Diagnosis not present

## 2015-04-02 DIAGNOSIS — N3000 Acute cystitis without hematuria: Secondary | ICD-10-CM

## 2015-04-02 LAB — POCT UA - MICROSCOPIC ONLY
Casts, Ur, LPF, POC: NEGATIVE
Crystals, Ur, HPF, POC: NEGATIVE
MUCUS UA: NEGATIVE
RBC, urine, microscopic: NEGATIVE
YEAST UA: NEGATIVE

## 2015-04-02 LAB — POCT URINALYSIS DIPSTICK
Bilirubin, UA: NEGATIVE
Blood, UA: NEGATIVE
Glucose, UA: NEGATIVE
Ketones, UA: NEGATIVE
NITRITE UA: NEGATIVE
PH UA: 6
PROTEIN UA: NEGATIVE
Spec Grav, UA: 1.01
Urobilinogen, UA: NEGATIVE

## 2015-04-02 MED ORDER — SULFAMETHOXAZOLE-TRIMETHOPRIM 800-160 MG PO TABS: 1.0000 | ORAL_TABLET | Freq: Two times a day (BID) | ORAL | Status: DC

## 2015-04-02 NOTE — Addendum Note (Signed)
Addended by: Arville Care on: 04/02/2015 12:11 PM   Modules accepted: Orders

## 2015-04-02 NOTE — Progress Notes (Signed)
BP 112/70 mmHg  Pulse 75  Temp(Src) 97.9 F (36.6 C) (Oral)  Ht  (1.676 m)  Wt 195 lb 9.6 oz (88.724 kg)  BMI 31.59 kg/m2   Subjective:    Patient ID: Laura Dalton, female    DOB: September 06, 1950, 65 y.o.   MRN: [id number]  HPI: Laura Dalton is a 64 y.o. female presenting on 04/02/2015 for Low back pain with fever   HPI Flank pain and fever Patient has been having 5 days worth of increased flank pain and back pain and urinary frequency. She also complains of just general fogginess or fuzziness of her mind. She also had fever in the 100.5 range on Monday and Tuesday 3 and 4 days ago. She says the fever has gotten better and the back pain has gotten better but the fuzziness is still kind of present. She denies any numbness or weakness on one side of her body that is new. She denies any visual changes or facial changes or difficulties with hearing.  Relevant past medical, surgical, family and social history reviewed and updated as indicated. Interim medical history since our last visit reviewed. Allergies and medications reviewed and updated.  Review of Systems  Constitutional: Positive for fever, chills and fatigue.  HENT: Negative for congestion, ear discharge and ear pain.   Eyes: Negative for redness and visual disturbance.  Respiratory: Negative for chest tightness and shortness of breath.   Cardiovascular: Negative for chest pain and leg swelling.  Genitourinary: Positive for urgency, frequency and flank pain. Negative for dysuria, hematuria, decreased urine volume, vaginal bleeding, vaginal discharge, difficulty urinating and vaginal pain.  Musculoskeletal: Negative for back pain and gait problem.  Skin: Negative for rash.  Neurological: Positive for dizziness. Negative for weakness, light-headedness, numbness and headaches.  Psychiatric/Behavioral: Positive for confusion. Negative for behavioral problems and agitation.  All other systems reviewed and are  negative.   Per HPI unless specifically indicated above     Medication List       This list is accurate as of: 04/02/15 11:26 AM.  Always use your most recent med list.               ADVAIR DISKUS 250-50 MCG/DOSE Aepb  Generic drug:  Fluticasone-Salmeterol  Inhale 1 puff into the lungs as needed.     amitriptyline 100 MG tablet  Commonly known as:  ELAVIL  Take 1 tablet (100 mg total) by mouth at bedtime.     amLODipine 2.5 MG tablet  Commonly known as:  NORVASC  Take 1 tablet (2.5 mg total) by mouth daily.     atorvastatin 40 MG tablet  Commonly known as:  LIPITOR  Take 1 tablet (40 mg total) by mouth daily.     gabapentin 600 MG tablet  Commonly known as:  NEURONTIN  Take 1 tablet (600 mg total) by mouth 3 (three) times daily.     glucose blood test strip  One Touch Ultra Test Strips Use to check blood glucose once a day Dx:  250.00     levothyroxine 75 MCG tablet  Commonly known as:  SYNTHROID  Take 1 tablet (75 mcg total) by mouth daily before breakfast.     losartan-hydrochlorothiazide 100-25 MG tablet  Commonly known as:  HYZAAR  Take 1 tablet by mouth daily.     meloxicam 15 MG tablet  Commonly known as:  MOBIC  TAKE ONE TABLET BY MOUTH ONCE DAILY     metFORMIN 500 MG tablet  Commonly  known as:  GLUCOPHAGE  TAKE ONE TABLET BY MOUTh once DAILY     methocarbamol 500 MG tablet  Commonly known as:  ROBAXIN  TAKE ONE TABLET BY MOUTH EVERY 6 HOURS AS NEEDED FOR MUSCLE SPASM     oxyCODONE 5 MG immediate release tablet  Commonly known as:  Oxy IR/ROXICODONE  Take 1 tablet (5 mg total) by mouth every 8 (eight) hours as needed for severe pain.           Objective:    BP 112/70 mmHg  Pulse 75  Temp(Src) 97.9 F (36.6 C) (Oral)  Ht  (1.676 m)  Wt 195 lb 9.6 oz (88.724 kg)  BMI 31.59 kg/m2  Wt Readings from Last 3 Encounters:  04/02/15 195 lb 9.6 oz (88.724 kg)  03/12/15 197 lb (89.359 kg)  09/02/14 206 lb (93.441 kg)    Physical Exam   Constitutional: She is oriented to person, place, and time. She appears well-developed and well-nourished. No distress.  Eyes: Conjunctivae and EOM are normal. Pupils are equal, round, and reactive to light.  Cardiovascular: Normal rate, regular rhythm, normal heart sounds and intact distal pulses.   No murmur heard. Pulmonary/Chest: Effort normal and breath sounds normal. No respiratory distress. She has no wheezes.  Abdominal: Soft. Bowel sounds are normal. She exhibits no distension. There is no hepatosplenomegaly. There is no tenderness. There is no rebound, no guarding and no CVA tenderness. No hernia.  Musculoskeletal: Normal range of motion. She exhibits no edema.       Lumbar back: She exhibits tenderness (Bilateral lower back tenderness, chronic). She exhibits normal range of motion.  Neurological: She is alert and oriented to person, place, and time. Coordination normal.  Skin: Skin is warm and dry. No rash noted. She is not diaphoretic.  Psychiatric: She has a normal mood and affect. Her behavior is normal.  Nursing note and vitals reviewed.   Results for orders placed or performed in visit on 04/02/15  POCT UA - Microscopic Only  Result Value Ref Range   WBC, Ur, HPF, POC 10-20    RBC, urine, microscopic neg    Bacteria, U Microscopic few    Mucus, UA neg    Epithelial cells, urine per micros few    Crystals, Ur, HPF, POC neg    Casts, Ur, LPF, POC neg    Yeast, UA neg   POCT urinalysis dipstick  Result Value Ref Range   Color, UA gold    Clarity, UA clear    Glucose, UA neg    Bilirubin, UA neg    Ketones, UA neg    Spec Grav, UA 1.010    Blood, UA neg    pH, UA 6.0    Protein, UA neg    Urobilinogen, UA negative    Nitrite, UA neg    Leukocytes, UA large (3+) (A) Negative      Assessment & Plan:   Problem List Items Addressed This Visit    None    Visit Diagnoses    Urinary frequency    -  Primary    Relevant Medications     sulfamethoxazole-trimethoprim (BACTRIM DS) 800-160 MG tablet    Other Relevant Orders    POCT UA - Microscopic Only (Completed)    POCT urinalysis dipstick (Completed)    Acute cystitis without hematuria        Relevant Medications    sulfamethoxazole-trimethoprim (BACTRIM DS) 800-160 MG tablet    Other Relevant Orders  POCT UA - Microscopic Only (Completed)    POCT urinalysis dipstick (Completed)        Follow up plan: Return if symptoms worsen or fail to improve.  Counseling provided for all of the vaccine components Orders Placed This Encounter  Procedures  . POCT UA - Microscopic Only  . POCT urinalysis dipstick    Arville Care, MD [facility] 04/02/2015, 11:26 AM

## 2015-04-04 LAB — URINE CULTURE

## 2015-04-05 ENCOUNTER — Telehealth: Payer: Self-pay | Admitting: Family Medicine

## 2015-04-05 MED ORDER — NITROFURANTOIN MONOHYD MACRO 100 MG PO CAPS: 100.0000 mg | ORAL_CAPSULE | Freq: Two times a day (BID) | ORAL | Status: DC

## 2015-04-05 NOTE — Telephone Encounter (Signed)
Reviewed results and notified pt she needs to stop Bactrum and start the nitrofurantoin. Pt voiced understanding.

## 2015-04-05 NOTE — Addendum Note (Signed)
Addended by: Arville Care on: 04/05/2015 08:02 AM   Modules accepted: Orders

## 2015-04-08 ENCOUNTER — Telehealth: Payer: Self-pay

## 2015-04-08 NOTE — Telephone Encounter (Signed)
Insurance prior authorized Nitrofurantoin until 03/12/16

## 2015-04-14 ENCOUNTER — Emergency Department (HOSPITAL_COMMUNITY): Payer: PPO

## 2015-04-14 ENCOUNTER — Encounter (HOSPITAL_COMMUNITY): Payer: Self-pay

## 2015-04-14 ENCOUNTER — Inpatient Hospital Stay (HOSPITAL_COMMUNITY)
Admission: EM | Admit: 2015-04-14 | Discharge: 2015-04-17 | DRG: 690 | Disposition: A | Discharge status: Home / Self Care | Payer: PPO | Attending: Internal Medicine | Admitting: Internal Medicine

## 2015-04-14 DIAGNOSIS — E1142 Type 2 diabetes mellitus with diabetic polyneuropathy: Secondary | ICD-10-CM | POA: Diagnosis not present

## 2015-04-14 DIAGNOSIS — Z809 Family history of malignant neoplasm, unspecified: Secondary | ICD-10-CM | POA: Diagnosis not present

## 2015-04-14 DIAGNOSIS — J449 Chronic obstructive pulmonary disease, unspecified: Secondary | ICD-10-CM | POA: Diagnosis present

## 2015-04-14 DIAGNOSIS — E785 Hyperlipidemia, unspecified: Secondary | ICD-10-CM | POA: Diagnosis present

## 2015-04-14 DIAGNOSIS — R079 Chest pain, unspecified: Secondary | ICD-10-CM | POA: Diagnosis not present

## 2015-04-14 DIAGNOSIS — M545 Low back pain, unspecified: Secondary | ICD-10-CM | POA: Insufficient documentation

## 2015-04-14 DIAGNOSIS — E039 Hypothyroidism, unspecified: Secondary | ICD-10-CM | POA: Diagnosis not present

## 2015-04-14 DIAGNOSIS — E782 Mixed hyperlipidemia: Secondary | ICD-10-CM | POA: Diagnosis present

## 2015-04-14 DIAGNOSIS — E038 Other specified hypothyroidism: Secondary | ICD-10-CM | POA: Diagnosis not present

## 2015-04-14 DIAGNOSIS — Z87891 Personal history of nicotine dependence: Secondary | ICD-10-CM | POA: Diagnosis not present

## 2015-04-14 DIAGNOSIS — E876 Hypokalemia: Secondary | ICD-10-CM | POA: Diagnosis present

## 2015-04-14 DIAGNOSIS — R0789 Other chest pain: Secondary | ICD-10-CM | POA: Diagnosis not present

## 2015-04-14 DIAGNOSIS — E119 Type 2 diabetes mellitus without complications: Secondary | ICD-10-CM

## 2015-04-14 DIAGNOSIS — Z7984 Long term (current) use of oral hypoglycemic drugs: Secondary | ICD-10-CM

## 2015-04-14 DIAGNOSIS — K219 Gastro-esophageal reflux disease without esophagitis: Secondary | ICD-10-CM | POA: Diagnosis not present

## 2015-04-14 DIAGNOSIS — R6510 Systemic inflammatory response syndrome (SIRS) of non-infectious origin without acute organ dysfunction: Secondary | ICD-10-CM

## 2015-04-14 DIAGNOSIS — Z87440 Personal history of urinary (tract) infections: Secondary | ICD-10-CM | POA: Diagnosis not present

## 2015-04-14 DIAGNOSIS — N12 Tubulo-interstitial nephritis, not specified as acute or chronic: Secondary | ICD-10-CM | POA: Diagnosis not present

## 2015-04-14 DIAGNOSIS — I7090 Unspecified atherosclerosis: Secondary | ICD-10-CM | POA: Diagnosis not present

## 2015-04-14 DIAGNOSIS — M549 Dorsalgia, unspecified: Secondary | ICD-10-CM

## 2015-04-14 DIAGNOSIS — Z833 Family history of diabetes mellitus: Secondary | ICD-10-CM | POA: Diagnosis not present

## 2015-04-14 DIAGNOSIS — I10 Essential (primary) hypertension: Secondary | ICD-10-CM | POA: Diagnosis present

## 2015-04-14 DIAGNOSIS — G8929 Other chronic pain: Secondary | ICD-10-CM | POA: Diagnosis not present

## 2015-04-14 DIAGNOSIS — E1169 Type 2 diabetes mellitus with other specified complication: Secondary | ICD-10-CM | POA: Diagnosis present

## 2015-04-14 DIAGNOSIS — A419 Sepsis, unspecified organism: Secondary | ICD-10-CM | POA: Diagnosis not present

## 2015-04-14 DIAGNOSIS — R109 Unspecified abdominal pain: Secondary | ICD-10-CM | POA: Diagnosis not present

## 2015-04-14 LAB — CBC WITH DIFFERENTIAL/PLATELET
Basophils Absolute: 0 10*3/uL (ref 0.0–0.1)
Basophils Relative: 0 %
Eosinophils Absolute: 0.1 10*3/uL (ref 0.0–0.7)
Eosinophils Relative: 1 %
HCT: 39.4 % (ref 36.0–46.0)
Hemoglobin: 12.9 g/dL (ref 12.0–15.0)
Lymphocytes Relative: 3 %
Lymphs Abs: 0.6 10*3/uL — ABNORMAL LOW (ref 0.7–4.0)
MCH: 28.2 pg (ref 26.0–34.0)
MCHC: 32.7 g/dL (ref 30.0–36.0)
MCV: 86.2 fL (ref 78.0–100.0)
Monocytes Absolute: 0.5 10*3/uL (ref 0.1–1.0)
Monocytes Relative: 3 %
Neutro Abs: 18.2 10*3/uL — ABNORMAL HIGH (ref 1.7–7.7)
Neutrophils Relative %: 93 %
Platelets: 300 10*3/uL (ref 150–400)
RBC: 4.57 MIL/uL (ref 3.87–5.11)
RDW: 13.2 % (ref 11.5–15.5)
WBC: 19.4 10*3/uL — ABNORMAL HIGH (ref 4.0–10.5)

## 2015-04-14 LAB — URINE MICROSCOPIC-ADD ON

## 2015-04-14 LAB — URINALYSIS, ROUTINE W REFLEX MICROSCOPIC
Bilirubin Urine: NEGATIVE
Glucose, UA: NEGATIVE mg/dL
Ketones, ur: NEGATIVE mg/dL
NITRITE: NEGATIVE
PROTEIN: NEGATIVE mg/dL
Specific Gravity, Urine: 1.015 (ref 1.005–1.030)
pH: 5.5 (ref 5.0–8.0)

## 2015-04-14 LAB — COMPREHENSIVE METABOLIC PANEL
ALT: 12 U/L — ABNORMAL LOW (ref 14–54)
AST: 20 U/L (ref 15–41)
Albumin: 4.4 g/dL (ref 3.5–5.0)
Alkaline Phosphatase: 68 U/L (ref 38–126)
Anion gap: 13 (ref 5–15)
BUN: 15 mg/dL (ref 6–20)
CO2: 29 mmol/L (ref 22–32)
Calcium: 9.6 mg/dL (ref 8.9–10.3)
Chloride: 98 mmol/L — ABNORMAL LOW (ref 101–111)
Creatinine, Ser: 0.84 mg/dL (ref 0.44–1.00)
GFR calc Af Amer: >60 mL/min (ref >60)
GFR calc non Af Amer: >60 mL/min (ref >60)
Glucose, Bld: 142 mg/dL — ABNORMAL HIGH (ref 65–99)
Potassium: 3.1 mmol/L — ABNORMAL LOW (ref 3.5–5.1)
Sodium: 140 mmol/L (ref 135–145)
Total Bilirubin: 0.5 mg/dL (ref 0.3–1.2)
Total Protein: 7.6 g/dL (ref 6.5–8.1)

## 2015-04-14 LAB — I-STAT CG4 LACTIC ACID, ED: Lactic Acid, Venous: 2.28 mmol/L (ref 0.5–2.0)

## 2015-04-14 LAB — TROPONIN I

## 2015-04-14 MED ORDER — MORPHINE SULFATE (PF) 4 MG/ML IV SOLN
4.0000 mg | Freq: Once | INTRAVENOUS | Status: AC
  Administered 2015-04-14: 4 mg via INTRAVENOUS
  Filled 2015-04-14: qty 1

## 2015-04-14 MED ORDER — SODIUM CHLORIDE 0.9 % IV BOLUS (SEPSIS)
1000.0000 mL | Freq: Once | INTRAVENOUS | Status: AC
  Administered 2015-04-14: 1000 mL via INTRAVENOUS

## 2015-04-14 MED ORDER — HYDROMORPHONE HCL 1 MG/ML IJ SOLN
0.5000 mg | Freq: Once | INTRAMUSCULAR | Status: AC
  Administered 2015-04-14: 0.5 mg via INTRAVENOUS
  Filled 2015-04-14: qty 1

## 2015-04-14 MED ORDER — IOHEXOL 350 MG/ML SOLN
100.0000 mL | Freq: Once | INTRAVENOUS | Status: AC | PRN
  Administered 2015-04-14: 100 mL via INTRAVENOUS

## 2015-04-14 NOTE — ED Provider Notes (Signed)
CSN: [id number]     Arrival date & time 04/14/15  2001 History  By signing my name below, I, Murriel Hopper, attest that this documentation has been prepared under the direction and in the presence of Eber Hong, MD. Electronically Signed: Murriel Hopper, ED Scribe. 04/14/2015. 8:44 PM.     Chief Complaint  Patient presents with  . Chest Pain  . Abdominal Pain      Patient is a 65 y.o. female presenting with abdominal pain. The history is provided by the patient. No language interpreter was used.  Abdominal Pain Associated symptoms: no cough, no dysuria, no fever and no vomiting    HPI Comments: Shadai Mcclane is a 65 y.o. female who presents to the Emergency Department complaining of constant, unchanged tightness chest pain that radiates to middle part of the back and down the arms that has been present for 3 hours. Pt reports that she saw her PCP last week and reports she was diagnosed with a UTI. Pt reports she was prescribed antibiotics to treat her UTI, and then received a call two days ago that stated she had E. Coli and was placed on a different antibiotic to treat that. Pt also reports associated abdominal pain and tremors that began shortly after her chest pain. Pt reports she felt totally normal yesterday, and felt normal all day today until symptoms began. Pt denies cough, fever, vomiting, dysuria.  Sx are severe, persistent, not made worse with moving    Past Medical History  Diagnosis Date  . Bursitis of hip   . Low back pain     benign turmor back  . COPD (chronic obstructive pulmonary disease) (HCC)   . Hypothyroidism   . Vitamin D deficiency disease   . Hypertension   . Diabetes mellitus without complication (HCC)     type II   Past Surgical History  Procedure Laterality Date  . Back surgery  1996    turmor on back   . Appendectomy    . Cholecystectomy    . Leg surgery Right     "clot and had drain in leg  rt lower leg   Family History  Problem Relation  Age of Onset  . Diabetes Mother   . Cancer Father    Social History  Substance Use Topics  . Smoking status: Former Smoker -- 0.50 packs/day for 45 years    Types: Cigarettes    Start date: 08/15/1962    Quit date: 12/01/2007  . Smokeless tobacco: Never Used  . Alcohol Use: No   OB History    No data available     Review of Systems  Constitutional: Negative for fever.  Respiratory: Negative for cough.   Gastrointestinal: Positive for abdominal pain. Negative for vomiting.  Genitourinary: Negative for dysuria.  All other systems reviewed and are negative.     Allergies  Aspirin  Home Medications   Prior to Admission medications   Medication Sig Start Date End Date Taking? Authorizing Provider  amitriptyline (ELAVIL) 100 MG tablet Take 1 tablet (100 mg total) by mouth at bedtime. 04/30/14  Yes Junie Spencer, FNP  amLODipine (NORVASC) 2.5 MG tablet Take 1 tablet (2.5 mg total) by mouth daily. 04/30/14  Yes Junie Spencer, FNP  atorvastatin (LIPITOR) 40 MG tablet Take 1 tablet (40 mg total) by mouth daily. 03/12/15  Yes Junie Spencer, FNP  Fluticasone-Salmeterol (ADVAIR DISKUS) 250-50 MCG/DOSE AEPB Inhale 1 puff into the lungs as needed (FOR SHORTNESS OF BREATH).  Yes Historical Provider, MD  gabapentin (NEURONTIN) 600 MG tablet Take 1 tablet (600 mg total) by mouth 3 (three) times daily. 04/30/14  Yes Junie Spencer, FNP  levothyroxine (SYNTHROID) 75 MCG tablet Take 1 tablet (75 mcg total) by mouth daily before breakfast. 03/17/15  Yes Junie Spencer, FNP  losartan-hydrochlorothiazide (HYZAAR) 100-25 MG tablet Take 1 tablet by mouth daily. 03/12/15  Yes Junie Spencer, FNP  meloxicam (MOBIC) 15 MG tablet TAKE ONE TABLET BY MOUTH ONCE DAILY 06/26/14  Yes Junie Spencer, FNP  metFORMIN (GLUCOPHAGE) 500 MG tablet TAKE ONE TABLET BY MOUTh once DAILY 04/30/14  Yes Junie Spencer, FNP  methocarbamol (ROBAXIN) 500 MG tablet TAKE ONE TABLET BY MOUTH EVERY 6 HOURS AS NEEDED FOR  MUSCLE SPASM 07/29/14  Yes Junie Spencer, FNP  nitrofurantoin, macrocrystal-monohydrate, (MACROBID) 100 MG capsule Take 1 capsule (100 mg total) by mouth 2 (two) times daily. 1 po BId Patient taking differently: Take 100 mg by mouth 2 (two) times daily.  04/05/15  Yes Elige Radon Dettinger, MD  oxyCODONE (OXY IR/ROXICODONE) 5 MG immediate release tablet Take 1 tablet (5 mg total) by mouth every 8 (eight) hours as needed for severe pain. 01/21/14  Yes Ranelle Oyster, MD  glucose blood test strip One Touch Ultra Test Strips Use to check blood glucose once a day Dx:  250.00 05/29/12   Ileana Ladd, MD  sulfamethoxazole-trimethoprim (BACTRIM DS) 800-160 MG tablet Take 1 tablet by mouth 2 (two) times daily. Patient not taking: Reported on 04/14/2015 04/02/15   Elige Radon Dettinger, MD   BP 133/80 mmHg  Pulse 113  Temp(Src) 97.6 F (36.4 C) (Oral)  Resp 20  Ht 5\' 6"  (1.676 m)  Wt 197 lb (89.359 kg)  BMI 31.81 kg/m2  SpO2 92% Physical Exam  Constitutional: She appears well-developed and well-nourished. She appears distressed.  Ill-appearing  HENT:  Head: Normocephalic and atraumatic.  Mouth/Throat: Oropharynx is clear and moist. No oropharyngeal exudate.  Eyes: Conjunctivae and EOM are normal. Pupils are equal, round, and reactive to light. Right eye exhibits no discharge. Left eye exhibits no discharge. No scleral icterus.  Neck: Normal range of motion. Neck supple. No JVD present. No thyromegaly present.  Cardiovascular: Normal rate, regular rhythm, normal heart sounds and intact distal pulses.  Exam reveals no gallop and no friction rub.   No murmur heard. Pulmonary/Chest: Effort normal and breath sounds normal. No respiratory distress. She has no wheezes. She has no rales.  Clear lungs  Abdominal: Soft. Bowel sounds are normal. She exhibits no distension and no mass. There is no tenderness.  Benign abdomen  Musculoskeletal: Normal range of motion. She exhibits no edema or tenderness.  No  pitting edema  Lymphadenopathy:    She has no cervical adenopathy.  Neurological: She is alert. Coordination normal.  Skin: Skin is warm and dry. No rash noted. No erythema.  Psychiatric: She has a normal mood and affect. Her behavior is normal.  Nursing note and vitals reviewed.   ED Course  Procedures (including critical care time)  DIAGNOSTIC STUDIES: Oxygen Saturation is 95% on room air, normal by my interpretation.    COORDINATION OF CARE: 8:26 PM Discussed treatment plan with pt at bedside and pt agreed to plan.   Labs Review Labs Reviewed  CBC WITH DIFFERENTIAL/PLATELET - Abnormal; Notable for the following:    WBC 19.4 (*)    Neutro Abs 18.2 (*)    Lymphs Abs 0.6 (*)    All other  components within normal limits  COMPREHENSIVE METABOLIC PANEL - Abnormal; Notable for the following:    Potassium 3.1 (*)    Chloride 98 (*)    Glucose, Bld 142 (*)    ALT 12 (*)    All other components within normal limits  URINALYSIS, ROUTINE W REFLEX MICROSCOPIC (NOT AT [facility]) - Abnormal; Notable for the following:    Hgb urine dipstick TRACE (*)    Leukocytes, UA MODERATE (*)    All other components within normal limits  URINE MICROSCOPIC-ADD ON - Abnormal; Notable for the following:    Squamous Epithelial / LPF 0-5 (*)    Bacteria, UA FEW (*)    All other components within normal limits  I-STAT CG4 LACTIC ACID, ED - Abnormal; Notable for the following:    Lactic Acid, Venous 2.28 (*)    All other components within normal limits  CULTURE, BLOOD (ROUTINE X 2)  CULTURE, BLOOD (ROUTINE X 2)  URINE CULTURE  TROPONIN I  I-STAT CG4 LACTIC ACID, ED    Imaging Review Dg Chest Port 1 View  04/14/2015  CLINICAL DATA:  Chest pain radiating to the mid back in down the arms over the past 3 hours. COPD. Hypertension. Diabetes. EXAM: PORTABLE CHEST 1 VIEW COMPARISON:  11/04/2013 FINDINGS: Body habitus reduces diagnostic sensitivity and specificity. Mild cardiomegaly with indistinct pulmonary  vasculature. Airway thickening is present, suggesting bronchitis or reactive airways disease. Atherosclerotic calcification of the aortic arch. Thoracic spondylosis. Degenerative AC joint spurring noted bilaterally. IMPRESSION: 1. Mild cardiomegaly and possible pulmonary venous hypertension, without overt edema. 2. Airway thickening is present, suggesting bronchitis or reactive airways disease. 3. Atherosclerosis. 4. Thoracic spondylosis. Electronically Signed   By: Gaylyn Rong M.D.   On: 04/14/2015 20:55   Ct Angio Chest Aorta W/cm &/or Wo/cm  04/14/2015  CLINICAL DATA:  Chest tightness radiating to the back and arms. Back pain. Symptoms for 3 hours. EXAM: CT ANGIOGRAPHY CHEST, ABDOMEN AND PELVIS TECHNIQUE: Multidetector CT imaging through the chest, abdomen and pelvis was performed using the standard protocol during bolus administration of intravenous contrast. Multiplanar reconstructed images and MIPs were obtained and reviewed to evaluate the vascular anatomy. CONTRAST:  OMNIPAQUE IOHEXOL 350 MG/ML SOLN COMPARISON:  Radiographs earlier this day FINDINGS: CTA CHEST FINDINGS Normal caliber thoracic aorta without aneurysm, dissection, aortic hematoma or acute aortic syndrome. Moderate diffuse atherosclerosis. Common origin of the brachiocephalic and left common carotid artery, bovine configuration. Heart at the upper limits of normal in size. Mild coronary artery calcifications. No mediastinal or hilar adenopathy. No pericardial effusion. Mild apical predominant emphysematous change. Central bronchial thickening. No consolidation or pulmonary edema. Trace subsegmental atelectasis in both lower lobes. No pulmonary nodule or mass. No pleural effusion. Degenerative change throughout thoracic spine with large anterior osteophytes and mild disc space narrowing, most prominent in the lower thoracic spine. Mild broad-based scoliotic curvature. Diminutive/absent posterior left tenth rib, may be postsurgical.  Review of the MIP images confirms the above findings. CTA ABDOMEN AND PELVIS FINDINGS Normal caliber abdominal aorta without aneurysm or dissection. Moderate to advanced atherosclerosis of the abdominal aorta and its branches. Atherosclerotic calcifications at the origin of the celiac artery but no definite narrowing. Superior mesenteric and inferior mesenteric arteries are patent. Bilateral renal arteries are patent, with accessory upper pole left renal artery. Postcholecystectomy with intra and extrahepatic biliary dilatation, likely postsurgical. No focal hepatic lesion allowing for arterial phase imaging. Spleen is normal in size. No pancreatic ductal dilatation or inflammation. Mild thickening of the left  adrenal gland without discrete nodule. Right adrenal gland is normal. Symmetric renal enhancement without hydronephrosis or focal renal abnormality. Stomach physiologically distended with ingested contents. There are no dilated or thickened bowel loops. Small to moderate stool burden, no colonic wall thickening. Scattered distal colonic diverticulosis without diverticulitis. No retroperitoneal or mesenteric adenopathy. No free air, free fluid, or intra-abdominal fluid collection. In the pelvis urinary bladder is minimally distended, equivocal wall thickening likely secondary to nondistention. Uterus is retroverted. No adnexal mass. No pelvic free fluid. Multilevel degenerative disc disease and facet arthropathy throughout the lumbar spine. Mild broad-based scoliotic curvature. There are no acute or suspicious osseous abnormalities. Review of the MIP images confirms the above findings. IMPRESSION: 1. Normal caliber thoracoabdominal aorta without acute aortic abnormality. Moderate atherosclerosis. 2. No acute abnormality in the chest, abdomen, or pelvis. 3. Chronic incidental findings include mild emphysema, postcholecystectomy with presumed postprocedural biliary dilatation, and mild diverticulosis without  diverticulitis. 4. Mild scoliotic curvature and multilevel degenerative change throughout the thoracic and lumbar spine. Electronically Signed   By: Rubye Oaks M.D.   On: 04/14/2015 22:16   Ct Cta Abd/pel W/cm &/or W/o Cm  04/14/2015  CLINICAL DATA:  Chest tightness radiating to the back and arms. Back pain. Symptoms for 3 hours. EXAM: CT ANGIOGRAPHY CHEST, ABDOMEN AND PELVIS TECHNIQUE: Multidetector CT imaging through the chest, abdomen and pelvis was performed using the standard protocol during bolus administration of intravenous contrast. Multiplanar reconstructed images and MIPs were obtained and reviewed to evaluate the vascular anatomy. CONTRAST:  OMNIPAQUE IOHEXOL 350 MG/ML SOLN COMPARISON:  Radiographs earlier this day FINDINGS: CTA CHEST FINDINGS Normal caliber thoracic aorta without aneurysm, dissection, aortic hematoma or acute aortic syndrome. Moderate diffuse atherosclerosis. Common origin of the brachiocephalic and left common carotid artery, bovine configuration. Heart at the upper limits of normal in size. Mild coronary artery calcifications. No mediastinal or hilar adenopathy. No pericardial effusion. Mild apical predominant emphysematous change. Central bronchial thickening. No consolidation or pulmonary edema. Trace subsegmental atelectasis in both lower lobes. No pulmonary nodule or mass. No pleural effusion. Degenerative change throughout thoracic spine with large anterior osteophytes and mild disc space narrowing, most prominent in the lower thoracic spine. Mild broad-based scoliotic curvature. Diminutive/absent posterior left tenth rib, may be postsurgical. Review of the MIP images confirms the above findings. CTA ABDOMEN AND PELVIS FINDINGS Normal caliber abdominal aorta without aneurysm or dissection. Moderate to advanced atherosclerosis of the abdominal aorta and its branches. Atherosclerotic calcifications at the origin of the celiac artery but no definite narrowing. Superior  mesenteric and inferior mesenteric arteries are patent. Bilateral renal arteries are patent, with accessory upper pole left renal artery. Postcholecystectomy with intra and extrahepatic biliary dilatation, likely postsurgical. No focal hepatic lesion allowing for arterial phase imaging. Spleen is normal in size. No pancreatic ductal dilatation or inflammation. Mild thickening of the left adrenal gland without discrete nodule. Right adrenal gland is normal. Symmetric renal enhancement without hydronephrosis or focal renal abnormality. Stomach physiologically distended with ingested contents. There are no dilated or thickened bowel loops. Small to moderate stool burden, no colonic wall thickening. Scattered distal colonic diverticulosis without diverticulitis. No retroperitoneal or mesenteric adenopathy. No free air, free fluid, or intra-abdominal fluid collection. In the pelvis urinary bladder is minimally distended, equivocal wall thickening likely secondary to nondistention. Uterus is retroverted. No adnexal mass. No pelvic free fluid. Multilevel degenerative disc disease and facet arthropathy throughout the lumbar spine. Mild broad-based scoliotic curvature. There are no acute or suspicious osseous abnormalities. Review  of the MIP images confirms the above findings. IMPRESSION: 1. Normal caliber thoracoabdominal aorta without acute aortic abnormality. Moderate atherosclerosis. 2. No acute abnormality in the chest, abdomen, or pelvis. 3. Chronic incidental findings include mild emphysema, postcholecystectomy with presumed postprocedural biliary dilatation, and mild diverticulosis without diverticulitis. 4. Mild scoliotic curvature and multilevel degenerative change throughout the thoracic and lumbar spine. Electronically Signed   By: Rubye Oaks M.D.   On: 04/14/2015 22:16   I have personally reviewed and evaluated these images and lab results as part of my medical decision-making.   EKG  Interpretation   Date/Time:  Wednesday April 14 2015 20:20:02 EST Ventricular Rate:  104 PR Interval:  200 QRS Duration: 88 QT Interval:  342 QTC Calculation: 450 R Axis:   -73 Text Interpretation:  Sinus or ectopic atrial tachycardia Left anterior  fascicular block Anteroseptal infarct, age indeterminate No old tracing to  compare Confirmed by Isbella Arline  MD, Shakinah Navis ([id number]) on 04/14/2015 9:22:35 PM      MDM   Final diagnoses:  Low back pain, unspecified back pain laterality, with sciatica presence unspecified  Chest pain, unspecified chest pain type  SIRS (systemic inflammatory response syndrome) (HCC)    I can't make the patient feel pain, vital signs remain persistently tachycardic. Labs are remarkable for significant leukocytosis with significant neutrophils, no other source of infection, electrolytes and renal function closed to baseline, CT scans of the chest abdomen and pelvis reveal no signs of aortic dissection or pulmonary embolism, the patient still remains tachycardic, I do not have an expiration for the patient's symptoms, I am concerned as she does have systemic inflammatory response syndrome without a source, I have discussed her care with the hospitalist who will admit the patient overnight.   I personally performed the services described in this documentation, which was scribed in my presence. The recorded information has been reviewed and is accurate.       Eber Hong, MD 04/14/15 2352

## 2015-04-14 NOTE — ED Notes (Signed)
Having back pain, chest pain, chills, and pain above the waist that I related to a UTI. Took a dose of unknown antibiotic.

## 2015-04-14 NOTE — H&P (Signed)
[facility] History and Physical  Laura Dalton ZOX:[id number] DOB: 17-Jun-1950    PCP:   Elige Radon Dettinger, MD   Chief Complaint: CP and abd pain  HPI: Laura Dalton is an 65 y.o. female  With a hx of COPD, hypothyroidism, Hypertension, DM type 2, presents with complaints of, tremors, cold chills, CP and abd pain onset 3 hrs PTA. CP is described as tightness that radiates to the back and down the arms. She reports recent abx use for a severe UTI with E. Coli. She reports feeling better after a course of abx. Symptoms worsened today. She admits chronic back pain, but current pain is more severe and in unusual place on right side due to uti. She denies dysuria. Per daughter, her mouth is extremely dry.  Workup in ED revealed WBC  19.4, equivocal UA, low potassium 3.1, lactic acid 2.28  Rewiew of Systems:  Constitutional: Negative for malaise, fever. No significant weight loss or weight gain. Positive for chills. Eyes: Negative for eye pain, redness and discharge, diplopia, visual changes, or flashes of light. ENMT: Negative for ear pain, hoarseness, nasal congestion, sinus pressure and sore throat. No headaches; tinnitus, drooling, or problem swallowing. Cardiovascular: Negative for palpitations, diaphoresis, and peripheral edema. ; No orthopnea, PND. Positive for dyspnea Respiratory: Negative for cough, hemoptysis, wheezing and stridor. No pleuritic chestpain. Gastrointestinal: Negative for nausea, vomiting, diarrhea, constipation, abdominal pain, melena, blood in stool, hematemesis, jaundice and rectal bleeding.    Genitourinary: Negative for dysuria, incontinence and hematuria; Positive for frequency of urination Musculoskeletal: Negative for back pain and neck pain. Negative for swelling and trauma.;  Skin: . Negative for pruritus, rash, abrasions, bruising and skin lesion.; ulcerations Neuro: Negative for headache, lightheadedness and neck stiffness. Negative for weakness,  altered level of consciousness , altered mental status, extremity weakness, burning feet, involuntary movement, seizure and syncope.  Psych: negative for anxiety, depression, insomnia, tearfulness, panic attacks, hallucinations, paranoia, suicidal or homicidal ideation    Past Medical History  Diagnosis Date  . Bursitis of hip   . Low back pain     benign turmor back  . COPD (chronic obstructive pulmonary disease) (HCC)   . Hypothyroidism   . Vitamin D deficiency disease   . Hypertension   . Diabetes mellitus without complication (HCC)     type II    Past Surgical History  Procedure Laterality Date  . Back surgery  1996    turmor on back   . Appendectomy    . Cholecystectomy    . Leg surgery Right     "clot and had drain in leg  rt lower leg    Medications:  HOME MEDS: Prior to Admission medications   Medication Sig Start Date End Date Taking? Authorizing Provider  amitriptyline (ELAVIL) 100 MG tablet Take 1 tablet (100 mg total) by mouth at bedtime. 04/30/14  Yes Junie Spencer, FNP  amLODipine (NORVASC) 2.5 MG tablet Take 1 tablet (2.5 mg total) by mouth daily. 04/30/14  Yes Junie Spencer, FNP  atorvastatin (LIPITOR) 40 MG tablet Take 1 tablet (40 mg total) by mouth daily. 03/12/15  Yes Junie Spencer, FNP  Fluticasone-Salmeterol (ADVAIR DISKUS) 250-50 MCG/DOSE AEPB Inhale 1 puff into the lungs as needed (FOR SHORTNESS OF BREATH).    Yes Historical Provider, MD  gabapentin (NEURONTIN) 600 MG tablet Take 1 tablet (600 mg total) by mouth 3 (three) times daily. 04/30/14  Yes Junie Spencer, FNP  levothyroxine (SYNTHROID) 75 MCG tablet Take 1  tablet (75 mcg total) by mouth daily before breakfast. 03/17/15  Yes Junie Spencer, FNP  losartan-hydrochlorothiazide (HYZAAR) 100-25 MG tablet Take 1 tablet by mouth daily. 03/12/15  Yes Junie Spencer, FNP  meloxicam (MOBIC) 15 MG tablet TAKE ONE TABLET BY MOUTH ONCE DAILY 06/26/14  Yes Junie Spencer, FNP  metFORMIN (GLUCOPHAGE) 500  MG tablet TAKE ONE TABLET BY MOUTh once DAILY 04/30/14  Yes Junie Spencer, FNP  methocarbamol (ROBAXIN) 500 MG tablet TAKE ONE TABLET BY MOUTH EVERY 6 HOURS AS NEEDED FOR MUSCLE SPASM 07/29/14  Yes Junie Spencer, FNP  nitrofurantoin, macrocrystal-monohydrate, (MACROBID) 100 MG capsule Take 1 capsule (100 mg total) by mouth 2 (two) times daily. 1 po BId Patient taking differently: Take 100 mg by mouth 2 (two) times daily.  04/05/15  Yes Elige Radon Dettinger, MD  oxyCODONE (OXY IR/ROXICODONE) 5 MG immediate release tablet Take 1 tablet (5 mg total) by mouth every 8 (eight) hours as needed for severe pain. 01/21/14  Yes Ranelle Oyster, MD  glucose blood test strip One Touch Ultra Test Strips Use to check blood glucose once a day Dx:  250.00 05/29/12   Ileana Ladd, MD  sulfamethoxazole-trimethoprim (BACTRIM DS) 800-160 MG tablet Take 1 tablet by mouth 2 (two) times daily. Patient not taking: Reported on 04/14/2015 04/02/15   Elige Radon Dettinger, MD     Allergies:  Allergies  Allergen Reactions  . Aspirin Hives    Social History:   reports that she quit smoking about 7 years ago. Her smoking use included Cigarettes. She started smoking about 52 years ago. She has a 22.5 pack-year smoking history. She has never used smokeless tobacco. She reports that she does not drink alcohol or use illicit drugs.  Family History: Family History  Problem Relation Age of Onset  . Diabetes Mother   . Cancer Father      Physical Exam: Filed Vitals:   04/14/15 2130 04/14/15 2200 04/14/15 2230 04/14/15 2300  BP: 124/57 116/97 133/61 125/69  Pulse: 110 113 100 108  Temp:      TempSrc:      Resp: 19 21 26 22   Height:      Weight:      SpO2: 93% 93% 90% 96%   Blood pressure 125/69, pulse 108, temperature 97.6 F (36.4 C), temperature source Oral, resp. rate 22, height 5\' 6"  (1.676 m), weight 89.359 kg (197 lb), SpO2 96 %.  GEN:  Pleasant, cooperative with exam. Appears ill but not toxic PSYCH:  alert  and oriented x4; does not appear anxious or depressed; affect is appropriate. HEENT: Mucous membranes pink and anicteric; PERRLA; EOM intact; no cervical lymphadenopathy nor thyromegaly or carotid bruit; no JVD; There were no stridor. Neck is very supple. Breasts:: Not examined CHEST WALL: No tenderness CHEST: Normal respiration, clear to auscultation bilaterally.  HEART: Regular rate and rhythm.  There are no murmur, rub, or gallops.   BACK: No kyphosis or scoliosis; Right CVA tenderness ABDOMEN: soft and non-tender; no masses, no organomegaly, normal abdominal bowel sounds; no pannus; no intertriginous candida. There is no rebound and no distention.  Rectal Exam: Not done EXTREMITIES: No bone or joint deformity; age-appropriate arthropathy of the hands and knees; no edema; no ulcerations.  There is no calf tenderness. Genitalia: not examined PULSES: 2+ and symmetric SKIN: Normal hydration no rash or ulceration CNS: Cranial nerves 2-12 grossly intact no focal lateralizing neurologic deficit.  Speech is fluent; uvula elevated with phonation, facial symmetry and  tongue midline. DTR are normal bilaterally, cerebella exam is intact, barbinski is negative and strengths are equaled bilaterally.  No sensory loss.   Labs on Admission:  Basic Metabolic Panel:  Recent Labs Lab 04/14/15 2045  NA 140  K 3.1*  CL 98*  CO2 29  GLUCOSE 142*  BUN 15  CREATININE 0.84  CALCIUM 9.6   Liver Function Tests:  Recent Labs Lab 04/14/15 2045  AST 20  ALT 12*  ALKPHOS 68  BILITOT 0.5  PROT 7.6  ALBUMIN 4.4   CBC:  Recent Labs Lab 04/14/15 2045  WBC 19.4*  NEUTROABS 18.2*  HGB 12.9  HCT 39.4  MCV 86.2  PLT 300   Cardiac Enzymes:  Recent Labs Lab 04/14/15 2045  TROPONINI <0.03      Radiological Exams on Admission: Dg Chest Port 1 View  04/14/2015  CLINICAL DATA:  Chest pain radiating to the mid back in down the arms over the past 3 hours. COPD. Hypertension. Diabetes. EXAM:  PORTABLE CHEST 1 VIEW COMPARISON:  11/04/2013 FINDINGS: Body habitus reduces diagnostic sensitivity and specificity. Mild cardiomegaly with indistinct pulmonary vasculature. Airway thickening is present, suggesting bronchitis or reactive airways disease. Atherosclerotic calcification of the aortic arch. Thoracic spondylosis. Degenerative AC joint spurring noted bilaterally. IMPRESSION: 1. Mild cardiomegaly and possible pulmonary venous hypertension, without overt edema. 2. Airway thickening is present, suggesting bronchitis or reactive airways disease. 3. Atherosclerosis. 4. Thoracic spondylosis. Electronically Signed   By: Gaylyn Rong M.D.   On: 04/14/2015 20:55   Ct Angio Chest Aorta W/cm &/or Wo/cm  04/14/2015  CLINICAL DATA:  Chest tightness radiating to the back and arms. Back pain. Symptoms for 3 hours. EXAM: CT ANGIOGRAPHY CHEST, ABDOMEN AND PELVIS TECHNIQUE: Multidetector CT imaging through the chest, abdomen and pelvis was performed using the standard protocol during bolus administration of intravenous contrast. Multiplanar reconstructed images and MIPs were obtained and reviewed to evaluate the vascular anatomy. CONTRAST:  OMNIPAQUE IOHEXOL 350 MG/ML SOLN COMPARISON:  Radiographs earlier this day FINDINGS: CTA CHEST FINDINGS Normal caliber thoracic aorta without aneurysm, dissection, aortic hematoma or acute aortic syndrome. Moderate diffuse atherosclerosis. Common origin of the brachiocephalic and left common carotid artery, bovine configuration. Heart at the upper limits of normal in size. Mild coronary artery calcifications. No mediastinal or hilar adenopathy. No pericardial effusion. Mild apical predominant emphysematous change. Central bronchial thickening. No consolidation or pulmonary edema. Trace subsegmental atelectasis in both lower lobes. No pulmonary nodule or mass. No pleural effusion. Degenerative change throughout thoracic spine with large anterior osteophytes and mild disc  space narrowing, most prominent in the lower thoracic spine. Mild broad-based scoliotic curvature. Diminutive/absent posterior left tenth rib, may be postsurgical. Review of the MIP images confirms the above findings. CTA ABDOMEN AND PELVIS FINDINGS Normal caliber abdominal aorta without aneurysm or dissection. Moderate to advanced atherosclerosis of the abdominal aorta and its branches. Atherosclerotic calcifications at the origin of the celiac artery but no definite narrowing. Superior mesenteric and inferior mesenteric arteries are patent. Bilateral renal arteries are patent, with accessory upper pole left renal artery. Postcholecystectomy with intra and extrahepatic biliary dilatation, likely postsurgical. No focal hepatic lesion allowing for arterial phase imaging. Spleen is normal in size. No pancreatic ductal dilatation or inflammation. Mild thickening of the left adrenal gland without discrete nodule. Right adrenal gland is normal. Symmetric renal enhancement without hydronephrosis or focal renal abnormality. Stomach physiologically distended with ingested contents. There are no dilated or thickened bowel loops. Small to moderate stool burden, no colonic wall  thickening. Scattered distal colonic diverticulosis without diverticulitis. No retroperitoneal or mesenteric adenopathy. No free air, free fluid, or intra-abdominal fluid collection. In the pelvis urinary bladder is minimally distended, equivocal wall thickening likely secondary to nondistention. Uterus is retroverted. No adnexal mass. No pelvic free fluid. Multilevel degenerative disc disease and facet arthropathy throughout the lumbar spine. Mild broad-based scoliotic curvature. There are no acute or suspicious osseous abnormalities. Review of the MIP images confirms the above findings. IMPRESSION: 1. Normal caliber thoracoabdominal aorta without acute aortic abnormality. Moderate atherosclerosis. 2. No acute abnormality in the chest, abdomen, or  pelvis. 3. Chronic incidental findings include mild emphysema, postcholecystectomy with presumed postprocedural biliary dilatation, and mild diverticulosis without diverticulitis. 4. Mild scoliotic curvature and multilevel degenerative change throughout the thoracic and lumbar spine. Electronically Signed   By: Rubye Oaks M.D.   On: 04/14/2015 22:16   Ct Cta Abd/pel W/cm &/or W/o Cm  04/14/2015  CLINICAL DATA:  Chest tightness radiating to the back and arms. Back pain. Symptoms for 3 hours. EXAM: CT ANGIOGRAPHY CHEST, ABDOMEN AND PELVIS TECHNIQUE: Multidetector CT imaging through the chest, abdomen and pelvis was performed using the standard protocol during bolus administration of intravenous contrast. Multiplanar reconstructed images and MIPs were obtained and reviewed to evaluate the vascular anatomy. CONTRAST:  OMNIPAQUE IOHEXOL 350 MG/ML SOLN COMPARISON:  Radiographs earlier this day FINDINGS: CTA CHEST FINDINGS Normal caliber thoracic aorta without aneurysm, dissection, aortic hematoma or acute aortic syndrome. Moderate diffuse atherosclerosis. Common origin of the brachiocephalic and left common carotid artery, bovine configuration. Heart at the upper limits of normal in size. Mild coronary artery calcifications. No mediastinal or hilar adenopathy. No pericardial effusion. Mild apical predominant emphysematous change. Central bronchial thickening. No consolidation or pulmonary edema. Trace subsegmental atelectasis in both lower lobes. No pulmonary nodule or mass. No pleural effusion. Degenerative change throughout thoracic spine with large anterior osteophytes and mild disc space narrowing, most prominent in the lower thoracic spine. Mild broad-based scoliotic curvature. Diminutive/absent posterior left tenth rib, may be postsurgical. Review of the MIP images confirms the above findings. CTA ABDOMEN AND PELVIS FINDINGS Normal caliber abdominal aorta without aneurysm or dissection. Moderate to  advanced atherosclerosis of the abdominal aorta and its branches. Atherosclerotic calcifications at the origin of the celiac artery but no definite narrowing. Superior mesenteric and inferior mesenteric arteries are patent. Bilateral renal arteries are patent, with accessory upper pole left renal artery. Postcholecystectomy with intra and extrahepatic biliary dilatation, likely postsurgical. No focal hepatic lesion allowing for arterial phase imaging. Spleen is normal in size. No pancreatic ductal dilatation or inflammation. Mild thickening of the left adrenal gland without discrete nodule. Right adrenal gland is normal. Symmetric renal enhancement without hydronephrosis or focal renal abnormality. Stomach physiologically distended with ingested contents. There are no dilated or thickened bowel loops. Small to moderate stool burden, no colonic wall thickening. Scattered distal colonic diverticulosis without diverticulitis. No retroperitoneal or mesenteric adenopathy. No free air, free fluid, or intra-abdominal fluid collection. In the pelvis urinary bladder is minimally distended, equivocal wall thickening likely secondary to nondistention. Uterus is retroverted. No adnexal mass. No pelvic free fluid. Multilevel degenerative disc disease and facet arthropathy throughout the lumbar spine. Mild broad-based scoliotic curvature. There are no acute or suspicious osseous abnormalities. Review of the MIP images confirms the above findings. IMPRESSION: 1. Normal caliber thoracoabdominal aorta without acute aortic abnormality. Moderate atherosclerosis. 2. No acute abnormality in the chest, abdomen, or pelvis. 3. Chronic incidental findings include mild emphysema, postcholecystectomy with presumed postprocedural  biliary dilatation, and mild diverticulosis without diverticulitis. 4. Mild scoliotic curvature and multilevel degenerative change throughout the thoracic and lumbar spine. Electronically Signed   By: Rubye Oaks  M.D.   On: 04/14/2015 22:16    EKG: Independently reviewed.    Assessment/Plan  1. Pyleonephritis, on exam, patient had right side CVA tenderness. Recent uti with E. Coli. Given bactrim for initial uti dx, but was disconintued after culture grew E. Coli. 3 day course of Macrobid was given 2. GERD, complains of CP, CT angio unremarkable. Will provide IV PPI 3. COPD, appears stable, no wheezing 4. HTN, stable, continue home medication 5. DM type 2, stable, continue home management 6. HLD, continue statins 7. Hypokalemia, will replace     PLAN:   IV Fluids  Zosyn IV  Pain medication  Replace potassium   Other plans as per orders.  Code Status: Full DVT prophylaxis: SCD's Family communication:  Daughter at bedside Disposition plan: Admit to hospital bed   Houston Siren, MD. FACP [facility] Pager [phone] 7pm to 7am.  04/14/2015, 11:42 PM   By signing my name below, I, Adron Bene, attest that this documentation has been prepared under the direction and in the presence of Houston Siren, MD. Electronically Signed: Adron Bene, Scribe 04/14/2015 11:42pm

## 2015-04-15 ENCOUNTER — Encounter (HOSPITAL_COMMUNITY): Payer: Self-pay | Admitting: Internal Medicine

## 2015-04-15 DIAGNOSIS — Z87891 Personal history of nicotine dependence: Secondary | ICD-10-CM | POA: Diagnosis not present

## 2015-04-15 DIAGNOSIS — E785 Hyperlipidemia, unspecified: Secondary | ICD-10-CM | POA: Diagnosis not present

## 2015-04-15 DIAGNOSIS — K219 Gastro-esophageal reflux disease without esophagitis: Secondary | ICD-10-CM | POA: Diagnosis not present

## 2015-04-15 DIAGNOSIS — R079 Chest pain, unspecified: Secondary | ICD-10-CM | POA: Insufficient documentation

## 2015-04-15 DIAGNOSIS — Z809 Family history of malignant neoplasm, unspecified: Secondary | ICD-10-CM | POA: Diagnosis not present

## 2015-04-15 DIAGNOSIS — M545 Low back pain, unspecified: Secondary | ICD-10-CM | POA: Insufficient documentation

## 2015-04-15 DIAGNOSIS — I10 Essential (primary) hypertension: Secondary | ICD-10-CM | POA: Diagnosis not present

## 2015-04-15 DIAGNOSIS — E876 Hypokalemia: Secondary | ICD-10-CM | POA: Diagnosis not present

## 2015-04-15 DIAGNOSIS — E039 Hypothyroidism, unspecified: Secondary | ICD-10-CM | POA: Diagnosis not present

## 2015-04-15 DIAGNOSIS — Z87440 Personal history of urinary (tract) infections: Secondary | ICD-10-CM | POA: Diagnosis not present

## 2015-04-15 DIAGNOSIS — Z7984 Long term (current) use of oral hypoglycemic drugs: Secondary | ICD-10-CM | POA: Diagnosis not present

## 2015-04-15 DIAGNOSIS — Z833 Family history of diabetes mellitus: Secondary | ICD-10-CM | POA: Diagnosis not present

## 2015-04-15 DIAGNOSIS — N12 Tubulo-interstitial nephritis, not specified as acute or chronic: Secondary | ICD-10-CM | POA: Diagnosis present

## 2015-04-15 DIAGNOSIS — E1142 Type 2 diabetes mellitus with diabetic polyneuropathy: Secondary | ICD-10-CM | POA: Diagnosis not present

## 2015-04-15 DIAGNOSIS — R109 Unspecified abdominal pain: Secondary | ICD-10-CM | POA: Diagnosis not present

## 2015-04-15 DIAGNOSIS — G8929 Other chronic pain: Secondary | ICD-10-CM | POA: Diagnosis not present

## 2015-04-15 DIAGNOSIS — J449 Chronic obstructive pulmonary disease, unspecified: Secondary | ICD-10-CM | POA: Diagnosis not present

## 2015-04-15 DIAGNOSIS — R6510 Systemic inflammatory response syndrome (SIRS) of non-infectious origin without acute organ dysfunction: Secondary | ICD-10-CM | POA: Diagnosis not present

## 2015-04-15 LAB — GLUCOSE, CAPILLARY
GLUCOSE-CAPILLARY: 119 mg/dL — AB (ref 65–99)
GLUCOSE-CAPILLARY: 100 mg/dL — AB (ref 65–99)
GLUCOSE-CAPILLARY: 120 mg/dL — AB (ref 65–99)
Glucose-Capillary: 129 mg/dL — ABNORMAL HIGH (ref 65–99)

## 2015-04-15 LAB — TSH: TSH: 2.984 u[IU]/mL (ref 0.350–4.500)

## 2015-04-15 MED ORDER — ENOXAPARIN SODIUM 40 MG/0.4ML ~~LOC~~ SOLN
40.0000 mg | SUBCUTANEOUS | Status: DC
  Administered 2015-04-15 – 2015-04-17 (×3): 40 mg via SUBCUTANEOUS
  Filled 2015-04-15 (×3): qty 0.4

## 2015-04-15 MED ORDER — PROCHLORPERAZINE EDISYLATE 5 MG/ML IJ SOLN
10.0000 mg | Freq: Four times a day (QID) | INTRAMUSCULAR | Status: DC | PRN
  Administered 2015-04-15 (×2): 10 mg via INTRAVENOUS
  Filled 2015-04-15 (×3): qty 2

## 2015-04-15 MED ORDER — PANTOPRAZOLE SODIUM 40 MG IV SOLR
40.0000 mg | Freq: Two times a day (BID) | INTRAVENOUS | Status: DC
  Administered 2015-04-15 (×2): 40 mg via INTRAVENOUS
  Filled 2015-04-15 (×2): qty 40

## 2015-04-15 MED ORDER — PANTOPRAZOLE SODIUM 40 MG PO TBEC
40.0000 mg | DELAYED_RELEASE_TABLET | Freq: Two times a day (BID) | ORAL | Status: DC
  Administered 2015-04-15 – 2015-04-17 (×4): 40 mg via ORAL
  Filled 2015-04-15 (×4): qty 1

## 2015-04-15 MED ORDER — DOCUSATE SODIUM 100 MG PO CAPS
100.0000 mg | ORAL_CAPSULE | Freq: Two times a day (BID) | ORAL | Status: DC
  Administered 2015-04-15 – 2015-04-17 (×5): 100 mg via ORAL
  Filled 2015-04-15 (×5): qty 1

## 2015-04-15 MED ORDER — INSULIN ASPART 100 UNIT/ML ~~LOC~~ SOLN
0.0000 [IU] | Freq: Three times a day (TID) | SUBCUTANEOUS | Status: DC
  Administered 2015-04-15: 2 [IU] via SUBCUTANEOUS

## 2015-04-15 MED ORDER — MORPHINE SULFATE (PF) 4 MG/ML IV SOLN: 4.0000 mg | INTRAVENOUS | Status: DC | PRN

## 2015-04-15 MED ORDER — MOMETASONE FURO-FORMOTEROL FUM 100-5 MCG/ACT IN AERO
2.0000 | INHALATION_SPRAY | Freq: Two times a day (BID) | RESPIRATORY_TRACT | Status: DC
  Administered 2015-04-15 – 2015-04-17 (×5): 2 via RESPIRATORY_TRACT
  Filled 2015-04-15: qty 8.8

## 2015-04-15 MED ORDER — ONDANSETRON HCL 4 MG/2ML IJ SOLN
4.0000 mg | Freq: Four times a day (QID) | INTRAMUSCULAR | Status: DC | PRN
  Administered 2015-04-15 (×2): 4 mg via INTRAVENOUS
  Filled 2015-04-15 (×2): qty 2

## 2015-04-15 MED ORDER — AMITRIPTYLINE HCL 25 MG PO TABS
100.0000 mg | ORAL_TABLET | Freq: Every day | ORAL | Status: DC
  Administered 2015-04-15 – 2015-04-16 (×2): 100 mg via ORAL
  Filled 2015-04-15 (×2): qty 4

## 2015-04-15 MED ORDER — ACETAMINOPHEN 650 MG RE SUPP: 650.0000 mg | Freq: Four times a day (QID) | RECTAL | Status: DC | PRN

## 2015-04-15 MED ORDER — INSULIN ASPART 100 UNIT/ML ~~LOC~~ SOLN: 0.0000 [IU] | Freq: Every day | SUBCUTANEOUS | Status: DC

## 2015-04-15 MED ORDER — POTASSIUM CHLORIDE CRYS ER 20 MEQ PO TBCR
40.0000 meq | EXTENDED_RELEASE_TABLET | Freq: Every day | ORAL | Status: DC
Start: 2015-04-15 — End: 2015-04-17
  Administered 2015-04-15 – 2015-04-17 (×3): 40 meq via ORAL
  Filled 2015-04-15 (×4): qty 2

## 2015-04-15 MED ORDER — SODIUM CHLORIDE 0.9 % IV SOLN
INTRAVENOUS | Status: DC
  Administered 2015-04-15 – 2015-04-16 (×5): via INTRAVENOUS
  Administered 2015-04-16: 150 mL/h via INTRAVENOUS
  Administered 2015-04-16: 02:00:00 via INTRAVENOUS
  Administered 2015-04-17: 150 mL/h via INTRAVENOUS

## 2015-04-15 MED ORDER — METHOCARBAMOL 500 MG PO TABS
500.0000 mg | ORAL_TABLET | Freq: Four times a day (QID) | ORAL | Status: DC | PRN
  Administered 2015-04-15: 500 mg via ORAL
  Filled 2015-04-15: qty 1

## 2015-04-15 MED ORDER — ATORVASTATIN CALCIUM 40 MG PO TABS
40.0000 mg | ORAL_TABLET | Freq: Every day | ORAL | Status: DC
  Administered 2015-04-15 – 2015-04-17 (×3): 40 mg via ORAL
  Filled 2015-04-15 (×3): qty 1

## 2015-04-15 MED ORDER — ACETAMINOPHEN 325 MG PO TABS: 650.0000 mg | ORAL_TABLET | Freq: Four times a day (QID) | ORAL | Status: DC | PRN

## 2015-04-15 MED ORDER — OXYCODONE-ACETAMINOPHEN 5-325 MG PO TABS
1.0000 | ORAL_TABLET | ORAL | Status: DC | PRN
  Administered 2015-04-15 – 2015-04-17 (×9): 2 via ORAL
  Filled 2015-04-15 (×9): qty 2

## 2015-04-15 MED ORDER — LEVOTHYROXINE SODIUM 75 MCG PO TABS
75.0000 ug | ORAL_TABLET | Freq: Every day | ORAL | Status: DC
  Administered 2015-04-15 – 2015-04-17 (×3): 75 ug via ORAL
  Filled 2015-04-15 (×3): qty 1

## 2015-04-15 MED ORDER — PIPERACILLIN-TAZOBACTAM 3.375 G IVPB
3.3750 g | Freq: Three times a day (TID) | INTRAVENOUS | Status: DC
  Administered 2015-04-15 (×2): 3.375 g via INTRAVENOUS
  Filled 2015-04-15 (×2): qty 50

## 2015-04-15 MED ORDER — CEFTRIAXONE SODIUM 1 G IJ SOLR
1.0000 g | INTRAMUSCULAR | Status: DC
  Administered 2015-04-15 – 2015-04-16 (×2): 1 g via INTRAVENOUS
  Filled 2015-04-15 (×4): qty 10

## 2015-04-15 NOTE — Progress Notes (Signed)
Pharmacy Antibiotic Note  Cledith Kamiya is a 65 y.o. female admitted on 04/14/2015 with UTI.  Pharmacy has been consulted for Ceftriaxone dosing.  Plan: Ceftriaxone 1gm IV q24h  Height:  (167.6 cm) Weight: 203 lb 14.8 oz (92.5 kg) IBW/kg (Calculated) : 59.3  Temp (24hrs), Avg:98.3 F (36.8 C), Min:97.6 F (36.4 C), Max:99.9 F (37.7 C)   Recent Labs Lab 04/14/15 2045 04/14/15 2049  WBC 19.4*  --   CREATININE 0.84  --   LATICACIDVEN  --  2.28*    Estimated Creatinine Clearance: 76.5 mL/min (by C-G formula based on Cr of 0.84).    Allergies  Allergen Reactions  . Aspirin Hives    Antimicrobials this admission: Zosyn 2/2 >> 2/2 Ceftriaxone 2/2 >>   Microbiology results: 2/2 WUJ:[id number] 1/20 UCx: E.coli s- rocephin   R- levaquin, septra Thank you for allowing pharmacy to be a part of this patient's care.  Elder Cyphers, BS Pharm D, BCPS Clinical Pharmacist Pager [phone] 04/15/2015 3:24 PM

## 2015-04-15 NOTE — Progress Notes (Signed)
Patient heart rate ins 30s per central telemetry.  RN to room.  Patient vomiting in trash can.  Per RN instructor in room patient ate some fruit and vomited.  Patient's daughter at bedside.  Patient states she feels swollen all over.  IV site WNL.  Dr. Rhona Leavens on unit and notified of above.  Gave order for patient to be NPO.

## 2015-04-15 NOTE — Care Management Note (Signed)
Case Management Note  Patient Details  Name: Laura Dalton MRN: [id number] Date of Birth: [birth year]  Subjective/Objective:                  Admitted with Pyelonephritis. Pt lives at home with grandson. Pt is ind with ADLs. Pt has no DME or HH services prior to admission. Pt is not homebound. Pt plans to return home with self care.   Action/Plan: No CM needs.   Expected Discharge Date:     04/20/2015             Expected Discharge Plan:  Home/Self Care  In-House Referral:  NA  Discharge planning Services  CM Consult  Post Acute Care Choice:  NA Choice offered to:  NA  DME Arranged:    DME Agency:     HH Arranged:    HH Agency:     Status of Service:  Completed, signed off  Medicare Important Message Given:    Date Medicare IM Given:    Medicare IM give by:    Date Additional Medicare IM Given:    Additional Medicare Important Message give by:     If discussed at Long Length of Stay Meetings, dates discussed:    Additional Comments:  Malcolm Metro, RN 04/15/2015, 11:46 AM

## 2015-04-15 NOTE — Progress Notes (Signed)
Patient requesting orders for PTA medications Oxycodone  PO Q8H PRN for severe pain and Robaxin  PO Q6H for muscle spasms. MD notified.

## 2015-04-15 NOTE — Progress Notes (Signed)
Dr. Rhona Leavens notified of patient's output and bladder scan showed greater than 260 ml.  Dr. Rhona Leavens stated to bladder later this evening and notify if greater than 300.

## 2015-04-15 NOTE — Progress Notes (Signed)
1200 - Patient states she feels swollen +1 BLE edema, IV site WNL.  States she still has nausea and right side back pain, legs weak.  Dr. Rhona Leavens on unit and notified.  No new orders. Will continue to monitor.

## 2015-04-15 NOTE — Progress Notes (Addendum)
Patient's BP per flowsheet and resting to be Clears, instead of NPO.  Dr. Rhona Leavens notified via text page.  Dr. Rhona Leavens returned page and stated patient may be on clear liquids.

## 2015-04-15 NOTE — Progress Notes (Signed)
[facility] PROGRESS NOTE  Irini Leet WJX:[id number] DOB: 07-Oct-1950 DOA: 04/14/2015 PCP: Elige Radon Dettinger, MD  Assessment/Plan: 1. Pyelonephritis: switch from zosyn to Ceftriaxone-->recent E. Coli UTI susceptible to Ceftriaxone 2. Diabetes Mellitus: continue SSI and CBG checks 3. Hyperlipidemia: continue Lipitor 40mg  daily 4. Hypothyroidism: continue levothyroxine 75 mcg daily 5. GERD: continue protonix 40mg  daily  Code Status: Full Family Communication: son at bedside Disposition Plan: home when tolerating PO, pain improved- likely 1-2 days   Consultants:  none  Procedures:  none  Antibiotics:  Zosyn >> started on 04/15/15- 04/15/15  Ceftriaxone>> started 04/15/15  HPI/Subjective: 65 year old female with recent history of E. Coli UTI and other PMH of COPD, hypothyroidism, Hypertension, DM type 2, presented with complaints of, tremors, cold chills, CP and abd pain onset 3 hrs PTA. CP is described as tightness that radiates to the back and down the arms.  She was started on Zosyn in the ED and fluid resuscitated.  She continued to have nausea and back pain that was finally controlled with zofran, compazine, morphine and Percocet.  She mentions that she felt significantly better after receiving the compazine.  At this time she is sleepy.  Nausea is improved.  No chest pain, shortness of breath.  Still experiencing some back pain.   Objective: Filed Vitals:   04/15/15 1246 04/15/15 1521  BP: 104/49 112/46  Pulse:  71  Temp:  97.7 F (36.5 C)  Resp:  20    Intake/Output Summary (Last 24 hours) at 04/15/15 1545 Last data filed at 04/15/15 1500  Gross per 24 hour  Intake   1805 ml  Output      0 ml  Net   1805 ml   Filed Weights   04/14/15 2010 04/15/15 0214  Weight: 89.359 kg (197 lb) 92.5 kg (203 lb 14.8 oz)    Exam:   General:  No acute distress, sleepy, laying in bed comfortably  Cardiovascular: S1S2, RRR, no murmurs or gallops; radial, DP and PT  pulses felt bilaterally  Respiratory: clear to auscultation bilaterally only poor respiratory effort and poor air movement, no wheezing, rhonchi or rales  Abdomen: soft, nontender (tender CVA bilaterally R>L) with bowel sounds in all four quadrants  Musculoskeletal: FROM upper and lower extremities  Data Reviewed: Basic Metabolic Panel:  Recent Labs Lab 04/14/15 2045  NA 140  K 3.1*  CL 98*  CO2 29  GLUCOSE 142*  BUN 15  CREATININE 0.84  CALCIUM 9.6   Liver Function Tests:  Recent Labs Lab 04/14/15 2045  AST 20  ALT 12*  ALKPHOS 68  BILITOT 0.5  PROT 7.6  ALBUMIN 4.4   No results for input(s): LIPASE, AMYLASE in the last 168 hours. No results for input(s): AMMONIA in the last 168 hours. CBC:  Recent Labs Lab 04/14/15 2045  WBC 19.4*  NEUTROABS 18.2*  HGB 12.9  HCT 39.4  MCV 86.2  PLT 300   Cardiac Enzymes:  Recent Labs Lab 04/14/15 2045  TROPONINI <0.03   BNP (last 3 results) No results for input(s): BNP in the last 8760 hours.  ProBNP (last 3 results) No results for input(s): PROBNP in the last 8760 hours.  CBG:  Recent Labs Lab 04/15/15 0757 04/15/15 1144  GLUCAP 119* 129*    Recent Results (from the past 240 hour(s))  Blood culture (routine x 2)     Status: None (Preliminary result)   Collection Time: 04/14/15  8:52 PM  Result Value Ref Range Status  Specimen Description BLOOD RIGHT ARM  Final   Special Requests BOTTLES DRAWN AEROBIC AND ANAEROBIC 6CC  Final   Culture NO GROWTH < 12 HOURS  Final   Report Status PENDING  Incomplete  Blood culture (routine x 2)     Status: None (Preliminary result)   Collection Time: 04/14/15  8:58 PM  Result Value Ref Range Status   Specimen Description BLOOD RIGHT HAND  Final   Special Requests BOTTLES DRAWN AEROBIC AND ANAEROBIC 6CC  Final   Culture NO GROWTH < 12 HOURS  Final   Report Status PENDING  Incomplete     Studies: Dg Chest Port 1 View  04/14/2015  CLINICAL DATA:  Chest pain  radiating to the mid back in down the arms over the past 3 hours. COPD. Hypertension. Diabetes. EXAM: PORTABLE CHEST 1 VIEW COMPARISON:  11/04/2013 FINDINGS: Body habitus reduces diagnostic sensitivity and specificity. Mild cardiomegaly with indistinct pulmonary vasculature. Airway thickening is present, suggesting bronchitis or reactive airways disease. Atherosclerotic calcification of the aortic arch. Thoracic spondylosis. Degenerative AC joint spurring noted bilaterally. IMPRESSION: 1. Mild cardiomegaly and possible pulmonary venous hypertension, without overt edema. 2. Airway thickening is present, suggesting bronchitis or reactive airways disease. 3. Atherosclerosis. 4. Thoracic spondylosis. Electronically Signed   By: Gaylyn Rong M.D.   On: 04/14/2015 20:55   Ct Angio Chest Aorta W/cm &/or Wo/cm  04/14/2015  CLINICAL DATA:  Chest tightness radiating to the back and arms. Back pain. Symptoms for 3 hours. EXAM: CT ANGIOGRAPHY CHEST, ABDOMEN AND PELVIS TECHNIQUE: Multidetector CT imaging through the chest, abdomen and pelvis was performed using the standard protocol during bolus administration of intravenous contrast. Multiplanar reconstructed images and MIPs were obtained and reviewed to evaluate the vascular anatomy. CONTRAST:  OMNIPAQUE IOHEXOL 350 MG/ML SOLN COMPARISON:  Radiographs earlier this day FINDINGS: CTA CHEST FINDINGS Normal caliber thoracic aorta without aneurysm, dissection, aortic hematoma or acute aortic syndrome. Moderate diffuse atherosclerosis. Common origin of the brachiocephalic and left common carotid artery, bovine configuration. Heart at the upper limits of normal in size. Mild coronary artery calcifications. No mediastinal or hilar adenopathy. No pericardial effusion. Mild apical predominant emphysematous change. Central bronchial thickening. No consolidation or pulmonary edema. Trace subsegmental atelectasis in both lower lobes. No pulmonary nodule or mass. No pleural  effusion. Degenerative change throughout thoracic spine with large anterior osteophytes and mild disc space narrowing, most prominent in the lower thoracic spine. Mild broad-based scoliotic curvature. Diminutive/absent posterior left tenth rib, may be postsurgical. Review of the MIP images confirms the above findings. CTA ABDOMEN AND PELVIS FINDINGS Normal caliber abdominal aorta without aneurysm or dissection. Moderate to advanced atherosclerosis of the abdominal aorta and its branches. Atherosclerotic calcifications at the origin of the celiac artery but no definite narrowing. Superior mesenteric and inferior mesenteric arteries are patent. Bilateral renal arteries are patent, with accessory upper pole left renal artery. Postcholecystectomy with intra and extrahepatic biliary dilatation, likely postsurgical. No focal hepatic lesion allowing for arterial phase imaging. Spleen is normal in size. No pancreatic ductal dilatation or inflammation. Mild thickening of the left adrenal gland without discrete nodule. Right adrenal gland is normal. Symmetric renal enhancement without hydronephrosis or focal renal abnormality. Stomach physiologically distended with ingested contents. There are no dilated or thickened bowel loops. Small to moderate stool burden, no colonic wall thickening. Scattered distal colonic diverticulosis without diverticulitis. No retroperitoneal or mesenteric adenopathy. No free air, free fluid, or intra-abdominal fluid collection. In the pelvis urinary bladder is minimally distended, equivocal wall thickening  likely secondary to nondistention. Uterus is retroverted. No adnexal mass. No pelvic free fluid. Multilevel degenerative disc disease and facet arthropathy throughout the lumbar spine. Mild broad-based scoliotic curvature. There are no acute or suspicious osseous abnormalities. Review of the MIP images confirms the above findings. IMPRESSION: 1. Normal caliber thoracoabdominal aorta without  acute aortic abnormality. Moderate atherosclerosis. 2. No acute abnormality in the chest, abdomen, or pelvis. 3. Chronic incidental findings include mild emphysema, postcholecystectomy with presumed postprocedural biliary dilatation, and mild diverticulosis without diverticulitis. 4. Mild scoliotic curvature and multilevel degenerative change throughout the thoracic and lumbar spine. Electronically Signed   By: Rubye Oaks M.D.   On: 04/14/2015 22:16   Ct Cta Abd/pel W/cm &/or W/o Cm  04/14/2015  CLINICAL DATA:  Chest tightness radiating to the back and arms. Back pain. Symptoms for 3 hours. EXAM: CT ANGIOGRAPHY CHEST, ABDOMEN AND PELVIS TECHNIQUE: Multidetector CT imaging through the chest, abdomen and pelvis was performed using the standard protocol during bolus administration of intravenous contrast. Multiplanar reconstructed images and MIPs were obtained and reviewed to evaluate the vascular anatomy. CONTRAST:  OMNIPAQUE IOHEXOL 350 MG/ML SOLN COMPARISON:  Radiographs earlier this day FINDINGS: CTA CHEST FINDINGS Normal caliber thoracic aorta without aneurysm, dissection, aortic hematoma or acute aortic syndrome. Moderate diffuse atherosclerosis. Common origin of the brachiocephalic and left common carotid artery, bovine configuration. Heart at the upper limits of normal in size. Mild coronary artery calcifications. No mediastinal or hilar adenopathy. No pericardial effusion. Mild apical predominant emphysematous change. Central bronchial thickening. No consolidation or pulmonary edema. Trace subsegmental atelectasis in both lower lobes. No pulmonary nodule or mass. No pleural effusion. Degenerative change throughout thoracic spine with large anterior osteophytes and mild disc space narrowing, most prominent in the lower thoracic spine. Mild broad-based scoliotic curvature. Diminutive/absent posterior left tenth rib, may be postsurgical. Review of the MIP images confirms the above findings. CTA  ABDOMEN AND PELVIS FINDINGS Normal caliber abdominal aorta without aneurysm or dissection. Moderate to advanced atherosclerosis of the abdominal aorta and its branches. Atherosclerotic calcifications at the origin of the celiac artery but no definite narrowing. Superior mesenteric and inferior mesenteric arteries are patent. Bilateral renal arteries are patent, with accessory upper pole left renal artery. Postcholecystectomy with intra and extrahepatic biliary dilatation, likely postsurgical. No focal hepatic lesion allowing for arterial phase imaging. Spleen is normal in size. No pancreatic ductal dilatation or inflammation. Mild thickening of the left adrenal gland without discrete nodule. Right adrenal gland is normal. Symmetric renal enhancement without hydronephrosis or focal renal abnormality. Stomach physiologically distended with ingested contents. There are no dilated or thickened bowel loops. Small to moderate stool burden, no colonic wall thickening. Scattered distal colonic diverticulosis without diverticulitis. No retroperitoneal or mesenteric adenopathy. No free air, free fluid, or intra-abdominal fluid collection. In the pelvis urinary bladder is minimally distended, equivocal wall thickening likely secondary to nondistention. Uterus is retroverted. No adnexal mass. No pelvic free fluid. Multilevel degenerative disc disease and facet arthropathy throughout the lumbar spine. Mild broad-based scoliotic curvature. There are no acute or suspicious osseous abnormalities. Review of the MIP images confirms the above findings. IMPRESSION: 1. Normal caliber thoracoabdominal aorta without acute aortic abnormality. Moderate atherosclerosis. 2. No acute abnormality in the chest, abdomen, or pelvis. 3. Chronic incidental findings include mild emphysema, postcholecystectomy with presumed postprocedural biliary dilatation, and mild diverticulosis without diverticulitis. 4. Mild scoliotic curvature and multilevel  degenerative change throughout the thoracic and lumbar spine. Electronically Signed   By: Lujean Rave.D.  On: 04/14/2015 22:16    Scheduled Meds: . amitriptyline  100 mg Oral QHS  . atorvastatin  40 mg Oral Daily  . cefTRIAXone (ROCEPHIN)  IV  1 g Intravenous Q24H  . docusate sodium  100 mg Oral BID  . enoxaparin (LOVENOX) injection  40 mg Subcutaneous Q24H  . insulin aspart  0-15 Units Subcutaneous TID WC  . insulin aspart  0-5 Units Subcutaneous QHS  . levothyroxine  75 mcg Oral QAC breakfast  . mometasone-formoterol  2 puff Inhalation BID  . pantoprazole  40 mg Oral BID  . potassium chloride  40 mEq Oral Daily   Continuous Infusions: . sodium chloride 150 mL/hr at 04/15/15 1222    Active Problems:   DM (diabetes mellitus) (HCC)   HLD (hyperlipidemia)   Hypothyroidism   Pyelonephritis    Time spent: 25 minutes    Reuben Likes  Resident Physician  04/15/2015, 3:45 PM  LOS: 0 days

## 2015-04-15 NOTE — Progress Notes (Signed)
2301 Patient's admitting level of care is telemetry with no current order for cardiac monitoring ordered. MD notified.

## 2015-04-15 NOTE — Progress Notes (Signed)
0600 Patient has Hx of DM2 and takes Metformin at home, currently no ss insulin or accuchecks ordered. MD notified.

## 2015-04-15 NOTE — Progress Notes (Signed)
Patient c/o n&v and has vomited x 1 time. MD notified and new order requested for antiemetic medication PRN.

## 2015-04-16 LAB — BASIC METABOLIC PANEL
Anion gap: 6 (ref 5–15)
BUN: 10 mg/dL (ref 6–20)
CHLORIDE: 106 mmol/L (ref 101–111)
CO2: 27 mmol/L (ref 22–32)
CREATININE: 0.67 mg/dL (ref 0.44–1.00)
Calcium: 8 mg/dL — ABNORMAL LOW (ref 8.9–10.3)
Glucose, Bld: 110 mg/dL — ABNORMAL HIGH (ref 65–99)
Potassium: 3.4 mmol/L — ABNORMAL LOW (ref 3.5–5.1)
SODIUM: 139 mmol/L (ref 135–145)

## 2015-04-16 LAB — GLUCOSE, CAPILLARY
GLUCOSE-CAPILLARY: 95 mg/dL (ref 65–99)
Glucose-Capillary: 102 mg/dL — ABNORMAL HIGH (ref 65–99)
Glucose-Capillary: 91 mg/dL (ref 65–99)
Glucose-Capillary: 106 mg/dL — ABNORMAL HIGH (ref 65–99)

## 2015-04-16 LAB — CBC
HCT: 30.3 % — ABNORMAL LOW (ref 36.0–46.0)
Hemoglobin: 9.8 g/dL — ABNORMAL LOW (ref 12.0–15.0)
MCH: 28 pg (ref 26.0–34.0)
MCHC: 32.3 g/dL (ref 30.0–36.0)
MCV: 86.6 fL (ref 78.0–100.0)
Platelets: 182 10*3/uL (ref 150–400)
RBC: 3.5 MIL/uL — AB (ref 3.87–5.11)
RDW: 13.3 % (ref 11.5–15.5)
WBC: 7 10*3/uL (ref 4.0–10.5)

## 2015-04-16 LAB — URINE CULTURE

## 2015-04-16 NOTE — Progress Notes (Signed)
[facility] PROGRESS NOTE  Laura Dalton ZOX:[id number] DOB: [birth year] DOA: 04/14/2015 PCP: Elige Radon Dettinger, MD  Assessment/Plan: 1. Pyelonephritis: switch from zosyn to Ceftriaxone-->recent E. Coli UTI susceptible to Ceftriaxone. Continue ceftriaxone today and will transition to PO antibiotics tomorrow.  Will order OOB to chair and heart healthy diet 2. Diabetes Mellitus: continue SSI and CBG checks- has only required 2 units in past 24 hours, can restart metformin tomorrow 3. Hyperlipidemia: Lipitor 40mg  daily 4. Hypothyroidism: levothyroxine 75 mcg daily 5. GERD: protonix 40mg  daily  Code Status: Full Family Communication: no family bedside Disposition Plan: home when tolerating PO, pain improved- likely tomorrow   Consultants:  none  Procedures:  none  Antibiotics:  Zosyn >> started on 04/15/15- 04/15/15  Ceftriaxone>> started 04/15/15  HPI/Subjective: 65 year old female with recent history of E. Coli UTI and other PMH of COPD, hypothyroidism, Hypertension, DM type 2, presented with complaints of, tremors, cold chills, CP and abd pain onset 3 hrs PTA. CP is described as tightness that radiates to the back and down the arms.  She was started on Zosyn in the ED and fluid resuscitated. Antibiotics changed to Rocephin yesterday.  Patient doing well today.  States she feels like a new person.  Significantly less pain today, good movement of lower extremities, and patient reports a good appetite.  She is hopeful to move out of the bed today.   Objective: Filed Vitals:   04/15/15 2245 04/16/15 0548  BP: 110/56 97/52  Pulse: 70 58  Temp: 97.4 F (36.3 C) 98.4 F (36.9 C)  Resp: 20 20    Intake/Output Summary (Last 24 hours) at 04/16/15 1125 Last data filed at 04/16/15 1032  Gross per 24 hour  Intake   2045 ml  Output   2250 ml  Net   -205 ml   Filed Weights   04/14/15 2010 04/15/15 0214  Weight: 89.359 kg (197 lb) 92.5 kg (203 lb 14.8 oz)     Exam:   General:  No acute distress, sleepy, laying in bed comfortably  Cardiovascular: S1S2, RRR, no murmurs or gallops; radial, DP and PT pulses felt bilaterally  Respiratory: clear to auscultation bilaterally only poor respiratory effort and poor air movement, no wheezing, rhonchi or rales  Abdomen: soft, nontender with bowel sounds in all four quadrants, no flank or CVA tenderness appreciated  Musculoskeletal: FROM upper and lower extremities  Data Reviewed: Basic Metabolic Panel:  Recent Labs Lab 04/14/15 2045 04/16/15 0627  NA 140 139  K 3.1* 3.4*  CL 98* 106  CO2 29 27  GLUCOSE 142* 110*  BUN 15 10  CREATININE 0.84 0.67  CALCIUM 9.6 8.0*   Liver Function Tests:  Recent Labs Lab 04/14/15 2045  AST 20  ALT 12*  ALKPHOS 68  BILITOT 0.5  PROT 7.6  ALBUMIN 4.4   No results for input(s): LIPASE, AMYLASE in the last 168 hours. No results for input(s): AMMONIA in the last 168 hours. CBC:  Recent Labs Lab 04/14/15 2045 04/16/15 0627  WBC 19.4* 7.0  NEUTROABS 18.2*  --   HGB 12.9 9.8*  HCT 39.4 30.3*  MCV 86.2 86.6  PLT 300 182   Cardiac Enzymes:  Recent Labs Lab 04/14/15 2045  TROPONINI <0.03   BNP (last 3 results) No results for input(s): BNP in the last 8760 hours.  ProBNP (last 3 results) No results for input(s): PROBNP in the last 8760 hours.  CBG:  Recent Labs Lab 04/15/15 0757 04/15/15 1144 04/15/15 1620 04/15/15  2328 04/16/15 0735  GLUCAP 119* 129* 100* 120* 102*    Recent Results (from the past 240 hour(s))  Blood culture (routine x 2)     Status: None (Preliminary result)   Collection Time: 04/14/15  8:52 PM  Result Value Ref Range Status   Specimen Description BLOOD RIGHT ARM  Final   Special Requests BOTTLES DRAWN AEROBIC AND ANAEROBIC 6CC  Final   Culture NO GROWTH 2 DAYS  Final   Report Status PENDING  Incomplete  Blood culture (routine x 2)     Status: None (Preliminary result)   Collection Time: 04/14/15   8:58 PM  Result Value Ref Range Status   Specimen Description BLOOD RIGHT HAND  Final   Special Requests BOTTLES DRAWN AEROBIC AND ANAEROBIC 6CC  Final   Culture NO GROWTH 2 DAYS  Final   Report Status PENDING  Incomplete     Studies: Dg Chest Port 1 View  04/14/2015  CLINICAL DATA:  Chest pain radiating to the mid back in down the arms over the past 3 hours. COPD. Hypertension. Diabetes. EXAM: PORTABLE CHEST 1 VIEW COMPARISON:  11/04/2013 FINDINGS: Body habitus reduces diagnostic sensitivity and specificity. Mild cardiomegaly with indistinct pulmonary vasculature. Airway thickening is present, suggesting bronchitis or reactive airways disease. Atherosclerotic calcification of the aortic arch. Thoracic spondylosis. Degenerative AC joint spurring noted bilaterally. IMPRESSION: 1. Mild cardiomegaly and possible pulmonary venous hypertension, without overt edema. 2. Airway thickening is present, suggesting bronchitis or reactive airways disease. 3. Atherosclerosis. 4. Thoracic spondylosis. Electronically Signed   By: Gaylyn Rong M.D.   On: 04/14/2015 20:55   Ct Angio Chest Aorta W/cm &/or Wo/cm  04/14/2015  CLINICAL DATA:  Chest tightness radiating to the back and arms. Back pain. Symptoms for 3 hours. EXAM: CT ANGIOGRAPHY CHEST, ABDOMEN AND PELVIS TECHNIQUE: Multidetector CT imaging through the chest, abdomen and pelvis was performed using the standard protocol during bolus administration of intravenous contrast. Multiplanar reconstructed images and MIPs were obtained and reviewed to evaluate the vascular anatomy. CONTRAST:  OMNIPAQUE IOHEXOL 350 MG/ML SOLN COMPARISON:  Radiographs earlier this day FINDINGS: CTA CHEST FINDINGS Normal caliber thoracic aorta without aneurysm, dissection, aortic hematoma or acute aortic syndrome. Moderate diffuse atherosclerosis. Common origin of the brachiocephalic and left common carotid artery, bovine configuration. Heart at the upper limits of normal in size.  Mild coronary artery calcifications. No mediastinal or hilar adenopathy. No pericardial effusion. Mild apical predominant emphysematous change. Central bronchial thickening. No consolidation or pulmonary edema. Trace subsegmental atelectasis in both lower lobes. No pulmonary nodule or mass. No pleural effusion. Degenerative change throughout thoracic spine with large anterior osteophytes and mild disc space narrowing, most prominent in the lower thoracic spine. Mild broad-based scoliotic curvature. Diminutive/absent posterior left tenth rib, may be postsurgical. Review of the MIP images confirms the above findings. CTA ABDOMEN AND PELVIS FINDINGS Normal caliber abdominal aorta without aneurysm or dissection. Moderate to advanced atherosclerosis of the abdominal aorta and its branches. Atherosclerotic calcifications at the origin of the celiac artery but no definite narrowing. Superior mesenteric and inferior mesenteric arteries are patent. Bilateral renal arteries are patent, with accessory upper pole left renal artery. Postcholecystectomy with intra and extrahepatic biliary dilatation, likely postsurgical. No focal hepatic lesion allowing for arterial phase imaging. Spleen is normal in size. No pancreatic ductal dilatation or inflammation. Mild thickening of the left adrenal gland without discrete nodule. Right adrenal gland is normal. Symmetric renal enhancement without hydronephrosis or focal renal abnormality. Stomach physiologically distended with ingested contents.  There are no dilated or thickened bowel loops. Small to moderate stool burden, no colonic wall thickening. Scattered distal colonic diverticulosis without diverticulitis. No retroperitoneal or mesenteric adenopathy. No free air, free fluid, or intra-abdominal fluid collection. In the pelvis urinary bladder is minimally distended, equivocal wall thickening likely secondary to nondistention. Uterus is retroverted. No adnexal mass. No pelvic free  fluid. Multilevel degenerative disc disease and facet arthropathy throughout the lumbar spine. Mild broad-based scoliotic curvature. There are no acute or suspicious osseous abnormalities. Review of the MIP images confirms the above findings. IMPRESSION: 1. Normal caliber thoracoabdominal aorta without acute aortic abnormality. Moderate atherosclerosis. 2. No acute abnormality in the chest, abdomen, or pelvis. 3. Chronic incidental findings include mild emphysema, postcholecystectomy with presumed postprocedural biliary dilatation, and mild diverticulosis without diverticulitis. 4. Mild scoliotic curvature and multilevel degenerative change throughout the thoracic and lumbar spine. Electronically Signed   By: Rubye Oaks M.D.   On: 04/14/2015 22:16   Ct Cta Abd/pel W/cm &/or W/o Cm  04/14/2015  CLINICAL DATA:  Chest tightness radiating to the back and arms. Back pain. Symptoms for 3 hours. EXAM: CT ANGIOGRAPHY CHEST, ABDOMEN AND PELVIS TECHNIQUE: Multidetector CT imaging through the chest, abdomen and pelvis was performed using the standard protocol during bolus administration of intravenous contrast. Multiplanar reconstructed images and MIPs were obtained and reviewed to evaluate the vascular anatomy. CONTRAST:  OMNIPAQUE IOHEXOL 350 MG/ML SOLN COMPARISON:  Radiographs earlier this day FINDINGS: CTA CHEST FINDINGS Normal caliber thoracic aorta without aneurysm, dissection, aortic hematoma or acute aortic syndrome. Moderate diffuse atherosclerosis. Common origin of the brachiocephalic and left common carotid artery, bovine configuration. Heart at the upper limits of normal in size. Mild coronary artery calcifications. No mediastinal or hilar adenopathy. No pericardial effusion. Mild apical predominant emphysematous change. Central bronchial thickening. No consolidation or pulmonary edema. Trace subsegmental atelectasis in both lower lobes. No pulmonary nodule or mass. No pleural effusion. Degenerative  change throughout thoracic spine with large anterior osteophytes and mild disc space narrowing, most prominent in the lower thoracic spine. Mild broad-based scoliotic curvature. Diminutive/absent posterior left tenth rib, may be postsurgical. Review of the MIP images confirms the above findings. CTA ABDOMEN AND PELVIS FINDINGS Normal caliber abdominal aorta without aneurysm or dissection. Moderate to advanced atherosclerosis of the abdominal aorta and its branches. Atherosclerotic calcifications at the origin of the celiac artery but no definite narrowing. Superior mesenteric and inferior mesenteric arteries are patent. Bilateral renal arteries are patent, with accessory upper pole left renal artery. Postcholecystectomy with intra and extrahepatic biliary dilatation, likely postsurgical. No focal hepatic lesion allowing for arterial phase imaging. Spleen is normal in size. No pancreatic ductal dilatation or inflammation. Mild thickening of the left adrenal gland without discrete nodule. Right adrenal gland is normal. Symmetric renal enhancement without hydronephrosis or focal renal abnormality. Stomach physiologically distended with ingested contents. There are no dilated or thickened bowel loops. Small to moderate stool burden, no colonic wall thickening. Scattered distal colonic diverticulosis without diverticulitis. No retroperitoneal or mesenteric adenopathy. No free air, free fluid, or intra-abdominal fluid collection. In the pelvis urinary bladder is minimally distended, equivocal wall thickening likely secondary to nondistention. Uterus is retroverted. No adnexal mass. No pelvic free fluid. Multilevel degenerative disc disease and facet arthropathy throughout the lumbar spine. Mild broad-based scoliotic curvature. There are no acute or suspicious osseous abnormalities. Review of the MIP images confirms the above findings. IMPRESSION: 1. Normal caliber thoracoabdominal aorta without acute aortic abnormality.  Moderate atherosclerosis. 2. No acute abnormality  in the chest, abdomen, or pelvis. 3. Chronic incidental findings include mild emphysema, postcholecystectomy with presumed postprocedural biliary dilatation, and mild diverticulosis without diverticulitis. 4. Mild scoliotic curvature and multilevel degenerative change throughout the thoracic and lumbar spine. Electronically Signed   By: Rubye Oaks M.D.   On: 04/14/2015 22:16    Scheduled Meds: . amitriptyline  100 mg Oral QHS  . atorvastatin  40 mg Oral Daily  . cefTRIAXone (ROCEPHIN)  IV  1 g Intravenous Q24H  . docusate sodium  100 mg Oral BID  . enoxaparin (LOVENOX) injection  40 mg Subcutaneous Q24H  . insulin aspart  0-15 Units Subcutaneous TID WC  . insulin aspart  0-5 Units Subcutaneous QHS  . levothyroxine  75 mcg Oral QAC breakfast  . mometasone-formoterol  2 puff Inhalation BID  . pantoprazole  40 mg Oral BID  . potassium chloride  40 mEq Oral Daily   Continuous Infusions: . sodium chloride 150 mL/hr at 04/16/15 9518    Active Problems:   DM (diabetes mellitus) (HCC)   HLD (hyperlipidemia)   Hypothyroidism   Pyelonephritis   Pain in the chest   Low back pain    Time spent: 25 minutes    Reuben Likes  Resident Physician  04/16/2015, 11:25 AM  LOS: 1 day

## 2015-04-16 NOTE — Care Management Important Message (Signed)
Important Message  Patient Details  Name: Laura Dalton MRN: [id number] Date of Birth: 1950-10-14   Medicare Important Message Given:  Yes    Malcolm Metro, RN 04/16/2015, 2:20 PM

## 2015-04-17 DIAGNOSIS — R6510 Systemic inflammatory response syndrome (SIRS) of non-infectious origin without acute organ dysfunction: Secondary | ICD-10-CM

## 2015-04-17 LAB — BASIC METABOLIC PANEL
ANION GAP: 5 (ref 5–15)
BUN: 9 mg/dL (ref 6–20)
CALCIUM: 8.2 mg/dL — AB (ref 8.9–10.3)
CO2: 26 mmol/L (ref 22–32)
Chloride: 111 mmol/L (ref 101–111)
Creatinine, Ser: 0.74 mg/dL (ref 0.44–1.00)
GFR calc Af Amer: >60 mL/min (ref >60)
GFR calc non Af Amer: >60 mL/min (ref >60)
GLUCOSE: 96 mg/dL (ref 65–99)
Potassium: 3.7 mmol/L (ref 3.5–5.1)
Sodium: 142 mmol/L (ref 135–145)

## 2015-04-17 LAB — GLUCOSE, CAPILLARY
Glucose-Capillary: 98 mg/dL (ref 65–99)
Glucose-Capillary: 103 mg/dL — ABNORMAL HIGH (ref 65–99)

## 2015-04-17 MED ORDER — AMOXICILLIN-POT CLAVULANATE 875-125 MG PO TABS
1.0000 | ORAL_TABLET | Freq: Two times a day (BID) | ORAL | Status: DC
  Administered 2015-04-17: 1 via ORAL
  Filled 2015-04-17: qty 1

## 2015-04-17 MED ORDER — AMOXICILLIN-POT CLAVULANATE 875-125 MG PO TABS: 1.0000 | ORAL_TABLET | Freq: Two times a day (BID) | ORAL | Status: AC

## 2015-04-17 NOTE — Progress Notes (Signed)
Patient d/c home with family, discharge instructions reviewed with patient and son verbalized understanding. Written copy of d/c instruction and new prescription given to patient. Patient left floor via wheelchair accompanied by staff  no c/o pain at d/c.

## 2015-04-17 NOTE — Discharge Summary (Signed)
Physician Discharge Summary  Laura Dalton ZOX:[id number] DOB: [birth year] DOA: 04/14/2015  PCP: Elige Radon Dettinger, MD  Admit date: 04/14/2015 Discharge date: 04/17/2015  Recommendations for Outpatient Follow-up:  1. Follow up with PCP within 7-10 days  Follow-up Information    Follow up with Nils Pyle, MD In 1 week.   Specialties:  Family Medicine, Cardiology   Contact information:   [address] [place] [address] [phone]      Discharge Diagnoses:  1. Pyelonephritis 2. Low back pain 3. Hyperlipidemia 4. Diabetes Mellitus 5. Hypothyroidism  Discharge Condition: stable Disposition: home  Diet recommendation: carb modified diet  Filed Weights   04/14/15 2010 04/15/15 0214  Weight: 89.359 kg (197 lb) 92.5 kg (203 lb 14.8 oz)    History of present illness:  Laura Dalton is an 65 y.o. female With a hx of COPD, hypothyroidism, Hypertension, DM type 2, presents with complaints of, tremors, cold chills, CP and abd pain onset 3 hrs PTA. CP is described as tightness that radiates to the back and down the arms. She reports recent abx use for a severe UTI with E. Coli. She reports feeling better after a course of abx. Symptoms worsened today. She admits chronic back pain, but current pain is more severe and in unusual place on right side due to uti. She denies dysuria. Per daughter, her mouth is extremely dry.  Workup in ED revealed WBC 19.4, equivocal UA, low potassium 3.1, lactic acid 2.28.  CT with contrast results below.   Hospital Course:  Patient admitted underwent an ACS rule out with serial troponins as well as initially started on Zosyn.  She was fluid resuscitated and given replacement potassium. Antibiotics changed to ceftriaxone on 04/15/15 2/2 susceptibilities noted on previous urine culture.  Patient improved with IVF and antibiotics, was able to tolerate her diet, reported no emesis or nausea and was afebrile for >24 hours prior to discharge.  She  was discharged home on augmentin after reviewing susceptibility from Urine Culture.  Blood sugars controlled with sliding scale insulin and metformin to resumed at time of discharged.  Patient metformin held 2/2 contrast from CT. Patient discharged home on Augmentin for a 10 day total course.   Discharge Instructions   Current Discharge Medication List    START taking these medications   Details  amoxicillin-clavulanate (AUGMENTIN) 875-125 MG tablet Take 1 tablet by mouth every 12 (twelve) hours. Qty: 17 tablet      CONTINUE these medications which have NOT CHANGED   Details  amitriptyline (ELAVIL) 100 MG tablet Take 1 tablet (100 mg total) by mouth at bedtime. Qty: 90 tablet, Refills: 3   Associated Diagnoses: Insomnia    amLODipine (NORVASC) 2.5 MG tablet Take 1 tablet (2.5 mg total) by mouth daily. Qty: 90 tablet, Refills: 3   Associated Diagnoses: Essential hypertension    atorvastatin (LIPITOR) 40 MG tablet Take 1 tablet (40 mg total) by mouth daily. Qty: 90 tablet, Refills: 1   Associated Diagnoses: HLD (hyperlipidemia)    Fluticasone-Salmeterol (ADVAIR DISKUS) 250-50 MCG/DOSE AEPB Inhale 1 puff into the lungs as needed (FOR SHORTNESS OF BREATH).     gabapentin (NEURONTIN) 600 MG tablet Take 1 tablet (600 mg total) by mouth 3 (three) times daily. Qty: 270 tablet, Refills: 3   Associated Diagnoses: Type 2 diabetes mellitus with diabetic polyneuropathy (HCC)    levothyroxine (SYNTHROID) 75 MCG tablet Take 1 tablet (75 mcg total) by mouth daily before breakfast. Qty: 90 tablet, Refills: 1  losartan-hydrochlorothiazide (HYZAAR) 100-25 MG tablet Take 1 tablet by mouth daily. Qty: 90 tablet, Refills: 2   Associated Diagnoses: Essential hypertension    metFORMIN (GLUCOPHAGE) 500 MG tablet TAKE ONE TABLET BY MOUTh once DAILY Qty: 90 tablet, Refills: 3   Associated Diagnoses: Type 2 diabetes mellitus with diabetic polyneuropathy (HCC)    methocarbamol (ROBAXIN) 500 MG  tablet TAKE ONE TABLET BY MOUTH EVERY 6 HOURS AS NEEDED FOR MUSCLE SPASM Qty: 60 tablet, Refills: 2   Associated Diagnoses: Chronic back pain    oxyCODONE (OXY IR/ROXICODONE) 5 MG immediate release tablet Take 1 tablet (5 mg total) by mouth every 8 (eight) hours as needed for severe pain. Qty: 60 tablet, Refills: 0   Associated Diagnoses: Lumbar post-laminectomy syndrome; Leg length discrepancy; Diabetic peripheral neuropathy associated with type 2 diabetes mellitus (HCC)      STOP taking these medications     meloxicam (MOBIC) 15 MG tablet      nitrofurantoin, macrocrystal-monohydrate, (MACROBID) 100 MG capsule      glucose blood test strip      sulfamethoxazole-trimethoprim (BACTRIM DS) 800-160 MG tablet        Allergies  Allergen Reactions  . Aspirin Hives    The results of significant diagnostics from this hospitalization (including imaging, microbiology, ancillary and laboratory) are listed below for reference.    Significant Diagnostic Studies: Dg Chest Port 1 View  04/14/2015  CLINICAL DATA:  Chest pain radiating to the mid back in down the arms over the past 3 hours. COPD. Hypertension. Diabetes. EXAM: PORTABLE CHEST 1 VIEW COMPARISON:  11/04/2013 FINDINGS: Body habitus reduces diagnostic sensitivity and specificity. Mild cardiomegaly with indistinct pulmonary vasculature. Airway thickening is present, suggesting bronchitis or reactive airways disease. Atherosclerotic calcification of the aortic arch. Thoracic spondylosis. Degenerative AC joint spurring noted bilaterally. IMPRESSION: 1. Mild cardiomegaly and possible pulmonary venous hypertension, without overt edema. 2. Airway thickening is present, suggesting bronchitis or reactive airways disease. 3. Atherosclerosis. 4. Thoracic spondylosis. Electronically Signed   By: Gaylyn Rong M.D.   On: 04/14/2015 20:55   Ct Angio Chest Aorta W/cm &/or Wo/cm  04/14/2015  CLINICAL DATA:  Chest tightness radiating to the back and  arms. Back pain. Symptoms for 3 hours. EXAM: CT ANGIOGRAPHY CHEST, ABDOMEN AND PELVIS TECHNIQUE: Multidetector CT imaging through the chest, abdomen and pelvis was performed using the standard protocol during bolus administration of intravenous contrast. Multiplanar reconstructed images and MIPs were obtained and reviewed to evaluate the vascular anatomy. CONTRAST:  OMNIPAQUE IOHEXOL 350 MG/ML SOLN COMPARISON:  Radiographs earlier this day FINDINGS: CTA CHEST FINDINGS Normal caliber thoracic aorta without aneurysm, dissection, aortic hematoma or acute aortic syndrome. Moderate diffuse atherosclerosis. Common origin of the brachiocephalic and left common carotid artery, bovine configuration. Heart at the upper limits of normal in size. Mild coronary artery calcifications. No mediastinal or hilar adenopathy. No pericardial effusion. Mild apical predominant emphysematous change. Central bronchial thickening. No consolidation or pulmonary edema. Trace subsegmental atelectasis in both lower lobes. No pulmonary nodule or mass. No pleural effusion. Degenerative change throughout thoracic spine with large anterior osteophytes and mild disc space narrowing, most prominent in the lower thoracic spine. Mild broad-based scoliotic curvature. Diminutive/absent posterior left tenth rib, may be postsurgical. Review of the MIP images confirms the above findings. CTA ABDOMEN AND PELVIS FINDINGS Normal caliber abdominal aorta without aneurysm or dissection. Moderate to advanced atherosclerosis of the abdominal aorta and its branches. Atherosclerotic calcifications at the origin of the celiac artery but no definite narrowing. Superior mesenteric  and inferior mesenteric arteries are patent. Bilateral renal arteries are patent, with accessory upper pole left renal artery. Postcholecystectomy with intra and extrahepatic biliary dilatation, likely postsurgical. No focal hepatic lesion allowing for arterial phase imaging. Spleen is  normal in size. No pancreatic ductal dilatation or inflammation. Mild thickening of the left adrenal gland without discrete nodule. Right adrenal gland is normal. Symmetric renal enhancement without hydronephrosis or focal renal abnormality. Stomach physiologically distended with ingested contents. There are no dilated or thickened bowel loops. Small to moderate stool burden, no colonic wall thickening. Scattered distal colonic diverticulosis without diverticulitis. No retroperitoneal or mesenteric adenopathy. No free air, free fluid, or intra-abdominal fluid collection. In the pelvis urinary bladder is minimally distended, equivocal wall thickening likely secondary to nondistention. Uterus is retroverted. No adnexal mass. No pelvic free fluid. Multilevel degenerative disc disease and facet arthropathy throughout the lumbar spine. Mild broad-based scoliotic curvature. There are no acute or suspicious osseous abnormalities. Review of the MIP images confirms the above findings. IMPRESSION: 1. Normal caliber thoracoabdominal aorta without acute aortic abnormality. Moderate atherosclerosis. 2. No acute abnormality in the chest, abdomen, or pelvis. 3. Chronic incidental findings include mild emphysema, postcholecystectomy with presumed postprocedural biliary dilatation, and mild diverticulosis without diverticulitis. 4. Mild scoliotic curvature and multilevel degenerative change throughout the thoracic and lumbar spine. Electronically Signed   By: Rubye Oaks M.D.   On: 04/14/2015 22:16   Ct Cta Abd/pel W/cm &/or W/o Cm  04/14/2015  CLINICAL DATA:  Chest tightness radiating to the back and arms. Back pain. Symptoms for 3 hours. EXAM: CT ANGIOGRAPHY CHEST, ABDOMEN AND PELVIS TECHNIQUE: Multidetector CT imaging through the chest, abdomen and pelvis was performed using the standard protocol during bolus administration of intravenous contrast. Multiplanar reconstructed images and MIPs were obtained and reviewed to  evaluate the vascular anatomy. CONTRAST:  OMNIPAQUE IOHEXOL 350 MG/ML SOLN COMPARISON:  Radiographs earlier this day FINDINGS: CTA CHEST FINDINGS Normal caliber thoracic aorta without aneurysm, dissection, aortic hematoma or acute aortic syndrome. Moderate diffuse atherosclerosis. Common origin of the brachiocephalic and left common carotid artery, bovine configuration. Heart at the upper limits of normal in size. Mild coronary artery calcifications. No mediastinal or hilar adenopathy. No pericardial effusion. Mild apical predominant emphysematous change. Central bronchial thickening. No consolidation or pulmonary edema. Trace subsegmental atelectasis in both lower lobes. No pulmonary nodule or mass. No pleural effusion. Degenerative change throughout thoracic spine with large anterior osteophytes and mild disc space narrowing, most prominent in the lower thoracic spine. Mild broad-based scoliotic curvature. Diminutive/absent posterior left tenth rib, may be postsurgical. Review of the MIP images confirms the above findings. CTA ABDOMEN AND PELVIS FINDINGS Normal caliber abdominal aorta without aneurysm or dissection. Moderate to advanced atherosclerosis of the abdominal aorta and its branches. Atherosclerotic calcifications at the origin of the celiac artery but no definite narrowing. Superior mesenteric and inferior mesenteric arteries are patent. Bilateral renal arteries are patent, with accessory upper pole left renal artery. Postcholecystectomy with intra and extrahepatic biliary dilatation, likely postsurgical. No focal hepatic lesion allowing for arterial phase imaging. Spleen is normal in size. No pancreatic ductal dilatation or inflammation. Mild thickening of the left adrenal gland without discrete nodule. Right adrenal gland is normal. Symmetric renal enhancement without hydronephrosis or focal renal abnormality. Stomach physiologically distended with ingested contents. There are no dilated or  thickened bowel loops. Small to moderate stool burden, no colonic wall thickening. Scattered distal colonic diverticulosis without diverticulitis. No retroperitoneal or mesenteric adenopathy. No free air, free fluid,  or intra-abdominal fluid collection. In the pelvis urinary bladder is minimally distended, equivocal wall thickening likely secondary to nondistention. Uterus is retroverted. No adnexal mass. No pelvic free fluid. Multilevel degenerative disc disease and facet arthropathy throughout the lumbar spine. Mild broad-based scoliotic curvature. There are no acute or suspicious osseous abnormalities. Review of the MIP images confirms the above findings. IMPRESSION: 1. Normal caliber thoracoabdominal aorta without acute aortic abnormality. Moderate atherosclerosis. 2. No acute abnormality in the chest, abdomen, or pelvis. 3. Chronic incidental findings include mild emphysema, postcholecystectomy with presumed postprocedural biliary dilatation, and mild diverticulosis without diverticulitis. 4. Mild scoliotic curvature and multilevel degenerative change throughout the thoracic and lumbar spine. Electronically Signed   By: Rubye Oaks M.D.   On: 04/14/2015 22:16    Microbiology: Recent Results (from the past 240 hour(s))  Blood culture (routine x 2)     Status: None (Preliminary result)   Collection Time: 04/14/15  8:52 PM  Result Value Ref Range Status   Specimen Description BLOOD RIGHT ARM  Final   Special Requests BOTTLES DRAWN AEROBIC AND ANAEROBIC 6CC  Final   Culture NO GROWTH 2 DAYS  Final   Report Status PENDING  Incomplete  Blood culture (routine x 2)     Status: None (Preliminary result)   Collection Time: 04/14/15  8:58 PM  Result Value Ref Range Status   Specimen Description BLOOD RIGHT HAND  Final   Special Requests BOTTLES DRAWN AEROBIC AND ANAEROBIC 6CC  Final   Culture NO GROWTH 2 DAYS  Final   Report Status PENDING  Incomplete  Urine culture     Status: None   Collection  Time: 04/14/15  9:06 PM  Result Value Ref Range Status   Specimen Description URINE, CLEAN CATCH  Final   Special Requests NONE  Final   Culture   Final    MULTIPLE SPECIES PRESENT, SUGGEST RECOLLECTION Performed at [facility]    Report Status 04/16/2015 FINAL  Final     Labs: Basic Metabolic Panel:  Recent Labs Lab 04/14/15 2045 04/16/15 0627 04/17/15 0604  NA 140 139 142  K 3.1* 3.4* 3.7  CL 98* 106 111  CO2 GLUCOSE 142* 110* 96  BUN CREATININE 0.84 0.67 0.74  CALCIUM 9.6 8.0* 8.2*   Liver Function Tests:  Recent Labs Lab 04/14/15 2045  AST 20  ALT 12*  ALKPHOS 68  BILITOT 0.5  PROT 7.6  ALBUMIN 4.4   No results for input(s): LIPASE, AMYLASE in the last 168 hours. No results for input(s): AMMONIA in the last 168 hours. CBC:  Recent Labs Lab 04/14/15 2045 04/16/15 0627  WBC 19.4* 7.0  NEUTROABS 18.2*  --   HGB 12.9 9.8*  HCT 39.4 30.3*  MCV 86.2 86.6  PLT 300 182   Cardiac Enzymes:  Recent Labs Lab 04/14/15 2045  TROPONINI <0.03   BNP: BNP (last 3 results) No results for input(s): BNP in the last 8760 hours.  ProBNP (last 3 results) No results for input(s): PROBNP in the last 8760 hours.  CBG:  Recent Labs Lab 04/16/15 0735 04/16/15 1139 04/16/15 1647 04/16/15 2049 04/17/15 0746  GLUCAP 102* 91 95 106* 98    Active Problems:   DM (diabetes mellitus) (HCC)   HLD (hyperlipidemia)   Hypothyroidism   Pyelonephritis   Pain in the chest   Low back pain   Time coordinating discharge: >30 minutes  Signed:  04/17/2015, 10:09 AM

## 2015-04-19 LAB — CULTURE, BLOOD (ROUTINE X 2)
CULTURE: NO GROWTH
Culture: NO GROWTH

## 2015-04-30 ENCOUNTER — Ambulatory Visit (INDEPENDENT_AMBULATORY_CARE_PROVIDER_SITE_OTHER): Payer: PPO | Admitting: Family Medicine

## 2015-04-30 ENCOUNTER — Encounter: Payer: Self-pay | Admitting: Family Medicine

## 2015-04-30 VITALS — BP 124/73 | HR 83 | Temp 98.7°F | Ht 66.0 in | Wt 193.0 lb

## 2015-04-30 DIAGNOSIS — J449 Chronic obstructive pulmonary disease, unspecified: Secondary | ICD-10-CM

## 2015-04-30 DIAGNOSIS — R29898 Other symptoms and signs involving the musculoskeletal system: Secondary | ICD-10-CM | POA: Diagnosis not present

## 2015-04-30 DIAGNOSIS — R350 Frequency of micturition: Secondary | ICD-10-CM | POA: Diagnosis not present

## 2015-04-30 LAB — POCT UA - MICROSCOPIC ONLY
Bacteria, U Microscopic: NEGATIVE
CASTS, UR, LPF, POC: NEGATIVE
CRYSTALS, UR, HPF, POC: NEGATIVE
Mucus, UA: NEGATIVE
RBC, urine, microscopic: NEGATIVE
YEAST UA: NEGATIVE

## 2015-04-30 LAB — POCT URINALYSIS DIPSTICK
BILIRUBIN UA: NEGATIVE
Glucose, UA: NEGATIVE
KETONES UA: NEGATIVE
Leukocytes, UA: NEGATIVE
Nitrite, UA: NEGATIVE
PH UA: 6
Protein, UA: NEGATIVE
RBC UA: NEGATIVE
Spec Grav, UA: <=1.005
Urobilinogen, UA: NEGATIVE

## 2015-04-30 MED ORDER — FLUTICASONE FUROATE-VILANTEROL 100-25 MCG/INH IN AEPB: 1.0000 | INHALATION_SPRAY | Freq: Every day | RESPIRATORY_TRACT | Status: DC

## 2015-04-30 NOTE — Progress Notes (Signed)
BP 124/73 mmHg  Pulse 83  Temp(Src) 98.7 F (37.1 C) (Oral)  Ht  (1.676 m)  Wt 193 lb (87.544 kg)  BMI 31.17 kg/m2   Subjective:    Patient ID: Laura Dalton, female    DOB: [birth year], 65 y.o.   MRN: [id number]  HPI: Laura Dalton is a 65 y.o. female presenting on 04/30/2015 for Hospital followup, recheck urine; Weakness in right leg; and Discuss changing Advair to Breo   HPI Right leg weakness and back pain Patient has been having right leg weakness and low back pain since she was in the hospital for 4 days after pyelonephritis. The pain she had previously but the weakness she felt was worse than what she had before. She feels like her strength is starting to return now that she's been on the hospital for a little bit slowly day by day. She denies any shooting pains down her legs or any numbness. The pain on her back is right lower more over her buttocks.  COPD Patient is coming in for a CPE recheck today. She says that she is currently on the Advair but only uses as needed once a day which isn't more than 2 or 3 times a week. She heard from a friend that. Was really good and she wants to try it and is wondering if it would be covered by her insurance. We will attempt to send it in for her. She denies any current respiratory issues.  Urinary frequency/follow-up hospital visit from pyelonephritis Patient was in the hospital between 04/14/2015 and 04/17/2015 for pyelonephritis. She is coming in today for follow-up on that and recheck her urine. She does not have any symptoms from it dysuria or hematuria. The symptoms all resolved. She also denies any fevers or chills.  Relevant past medical, surgical, family and social history reviewed and updated as indicated. Interim medical history since our last visit reviewed. Allergies and medications reviewed and updated.  Review of Systems  Constitutional: Negative for fever and chills.  HENT: Negative for congestion, ear discharge  and ear pain.   Eyes: Negative for redness and visual disturbance.  Respiratory: Negative for chest tightness and shortness of breath.   Cardiovascular: Negative for chest pain and leg swelling.  Genitourinary: Negative for dysuria, urgency, hematuria, decreased urine volume and difficulty urinating.  Musculoskeletal: Positive for back pain and arthralgias. Negative for gait problem.  Skin: Negative for rash.  Neurological: Positive for weakness. Negative for light-headedness and headaches.  Psychiatric/Behavioral: Negative for behavioral problems and agitation.  All other systems reviewed and are negative.   Per HPI unless specifically indicated above     Medication List       This list is accurate as of: 04/30/15  1:42 PM.  Always use your most recent med list.               ADVAIR DISKUS 250-50 MCG/DOSE Aepb  Generic drug:  Fluticasone-Salmeterol  Inhale 1 puff into the lungs as needed (FOR SHORTNESS OF BREATH). Reported on 04/30/2015     amitriptyline 100 MG tablet  Commonly known as:  ELAVIL  Take 1 tablet (100 mg total) by mouth at bedtime.     amLODipine 2.5 MG tablet  Commonly known as:  NORVASC  Take 1 tablet (2.5 mg total) by mouth daily.     atorvastatin 40 MG tablet  Commonly known as:  LIPITOR  Take 1 tablet (40 mg total) by mouth daily.     fluticasone furoate-vilanterol  100-25 MCG/INH Aepb  Commonly known as:  BREO ELLIPTA  Inhale 1 puff into the lungs daily.     gabapentin 600 MG tablet  Commonly known as:  NEURONTIN  Take 1 tablet (600 mg total) by mouth 3 (three) times daily.     levothyroxine 75 MCG tablet  Commonly known as:  SYNTHROID  Take 1 tablet (75 mcg total) by mouth daily before breakfast.     losartan-hydrochlorothiazide 100-25 MG tablet  Commonly known as:  HYZAAR  Take 1 tablet by mouth daily.     metFORMIN 500 MG tablet  Commonly known as:  GLUCOPHAGE  TAKE ONE TABLET BY MOUTh once DAILY     methocarbamol 500 MG tablet    Commonly known as:  ROBAXIN  TAKE ONE TABLET BY MOUTH EVERY 6 HOURS AS NEEDED FOR MUSCLE SPASM     oxyCODONE 5 MG immediate release tablet  Commonly known as:  Oxy IR/ROXICODONE  Take 1 tablet (5 mg total) by mouth every 8 (eight) hours as needed for severe pain.           Objective:    BP 124/73 mmHg  Pulse 83  Temp(Src) 98.7 F (37.1 C) (Oral)  Ht 5\' 6"  (1.676 m)  Wt 193 lb (87.544 kg)  BMI 31.17 kg/m2  Wt Readings from Last 3 Encounters:  04/30/15 193 lb (87.544 kg)  04/15/15 203 lb 14.8 oz (92.5 kg)  04/02/15 195 lb 9.6 oz (88.724 kg)    Physical Exam  Constitutional: She is oriented to person, place, and time. She appears well-developed and well-nourished. No distress.  Eyes: Conjunctivae and EOM are normal. Pupils are equal, round, and reactive to light.  Cardiovascular: Normal rate, regular rhythm, normal heart sounds and intact distal pulses.   No murmur heard. Pulmonary/Chest: Effort normal and breath sounds normal. No respiratory distress. She has no wheezes.  Musculoskeletal: Normal range of motion. She exhibits no edema.       Lumbar back: She exhibits tenderness (Right lumbar and paraspinal sacral tenderness). She exhibits normal range of motion (No loss of strength noted, 5 out of 5 in knee flexion and extension and dorsiflexion and plantar flexion.), no bony tenderness, no swelling and no deformity.  Neurological: She is alert and oriented to person, place, and time. Coordination normal.  Skin: Skin is warm and dry. No rash noted. She is not diaphoretic.  Psychiatric: She has a normal mood and affect. Her behavior is normal.  Nursing note and vitals reviewed.   Results for orders placed or performed in visit on 04/30/15  POCT urinalysis dipstick  Result Value Ref Range   Color, UA yellow    Clarity, UA clear    Glucose, UA neg    Bilirubin, UA neg    Ketones, UA neg    Spec Grav, UA <=1.005    Blood, UA neg    pH, UA 6.0    Protein, UA neg     Urobilinogen, UA negative    Nitrite, UA neg    Leukocytes, UA Negative Negative  POCT UA - Microscopic Only  Result Value Ref Range   WBC, Ur, HPF, POC 15-20    RBC, urine, microscopic neg    Bacteria, U Microscopic neg    Mucus, UA neg    Epithelial cells, urine per micros occ    Crystals, Ur, HPF, POC neg    Casts, Ur, LPF, POC neg    Yeast, UA neg       Assessment & Plan:  Problem List Items Addressed This Visit      Respiratory   COPD (chronic obstructive pulmonary disease) (HCC)   Relevant Medications   fluticasone furoate-vilanterol (BREO ELLIPTA) 100-25 MCG/INH AEPB    Other Visit Diagnoses    Urinary frequency    -  Primary    Relevant Orders    POCT urinalysis dipstick (Completed)    POCT UA - Microscopic Only (Completed)    Right leg weakness         Patient has some right leg weakness with history of back pain, we'll  send to PT    Relevant Orders    Ambulatory referral to Physical Therapy        Follow up plan: Return in about 4 weeks (around 05/28/2015), or if symptoms worsen or fail to improve, for  follow-up weakness.  Counseling provided for all of the vaccine components Orders Placed This Encounter  Procedures  . Ambulatory referral to Physical Therapy  . POCT urinalysis dipstick  . POCT UA - Microscopic Only    Arville Care, MD [facility] 04/30/2015, 1:42 PM

## 2015-05-03 ENCOUNTER — Other Ambulatory Visit: Payer: Self-pay | Admitting: Family

## 2015-05-07 ENCOUNTER — Other Ambulatory Visit: Payer: Self-pay | Admitting: Family

## 2015-05-28 ENCOUNTER — Ambulatory Visit: Payer: PPO | Admitting: Family Medicine

## 2015-07-02 ENCOUNTER — Ambulatory Visit (INDEPENDENT_AMBULATORY_CARE_PROVIDER_SITE_OTHER): Payer: PPO | Admitting: Family Medicine

## 2015-07-02 ENCOUNTER — Other Ambulatory Visit: Payer: Self-pay | Admitting: *Deleted

## 2015-07-02 ENCOUNTER — Encounter: Payer: Self-pay | Admitting: Family Medicine

## 2015-07-02 VITALS — BP 112/65 | HR 74 | Temp 99.0°F | Ht 66.0 in | Wt 193.0 lb

## 2015-07-02 DIAGNOSIS — N3001 Acute cystitis with hematuria: Secondary | ICD-10-CM

## 2015-07-02 DIAGNOSIS — G8929 Other chronic pain: Secondary | ICD-10-CM | POA: Diagnosis not present

## 2015-07-02 DIAGNOSIS — M549 Dorsalgia, unspecified: Secondary | ICD-10-CM | POA: Diagnosis not present

## 2015-07-02 DIAGNOSIS — N390 Urinary tract infection, site not specified: Secondary | ICD-10-CM

## 2015-07-02 LAB — URINALYSIS, COMPLETE
Bilirubin, UA: NEGATIVE
Glucose, UA: NEGATIVE
Ketones, UA: NEGATIVE
Nitrite, UA: NEGATIVE
PH UA: 5.5 (ref 5.0–7.5)
PROTEIN UA: NEGATIVE
Specific Gravity, UA: <=1 (ref 1.005–1.030)
UUROB: 0.2 mg/dL (ref 0.2–1.0)

## 2015-07-02 LAB — MICROSCOPIC EXAMINATION: BACTERIA UA: NONE SEEN

## 2015-07-02 MED ORDER — TRAMADOL HCL 50 MG PO TABS: 50.0000 mg | ORAL_TABLET | Freq: Three times a day (TID) | ORAL | Status: DC | PRN

## 2015-07-02 MED ORDER — CEFDINIR 300 MG PO CAPS: 300.0000 mg | ORAL_CAPSULE | Freq: Two times a day (BID) | ORAL | Status: DC

## 2015-07-02 MED ORDER — METHOCARBAMOL 500 MG PO TABS: ORAL_TABLET | ORAL | Status: DC

## 2015-07-02 NOTE — Addendum Note (Signed)
Addended by: Elenora GammaBRADSHAW, Khianna Blazina L on: 07/02/2015 11:53 AM   Modules accepted: Orders

## 2015-07-02 NOTE — Patient Instructions (Signed)
Great to meet you!  I think you are having back spasms  A good start for that is heat, pain medicines, and a muscle relaxer (methocarbamol)  Tramadol is a pain medicine, it is not a true narcotic but can make you sleepy so do not drive after you take it.

## 2015-07-02 NOTE — Progress Notes (Addendum)
   HPI  Patient presents today pain.  Patient explains that over the last 4-5 days she's been ill with nausea and vomiting along with diarrhea, however after that began she started having severe back pain.  She had vomiting 3 days ago more than 5 episodes that day containing only food and fluid, no blood or bile. She also had several episodes of diarrhea that day.  One day later she developed severe back pain that started at her shoulder blades and went down to her lumbar area. She also states that this has flared up her sciatica. She has left greater than right sciatica symptoms on the bilateral legs.  She tried amitriptyline with no improvement, she states that she does not take amitriptyline every night  PMH: Smoking status noted ROS: Per HPI  Objective: BP 112/65 mmHg  Pulse 74  Temp(Src) 99 F (37.2 C) (Oral)  Ht 5\' 6"  (1.676 m)  Wt 193 lb (87.544 kg)  BMI 31.17 kg/m2 Gen: NAD, alert, cooperative with exam HEENT: NCAT CV: RRR, good S1/S2, no murmur Resp: CTABL, no wheezes, non-labored Abd: SNTND, BS present, no guarding or organomegaly Ext: No edema, warm Neuro: Alert and oriented, strength 5/5 and sensation intact in bilateral lower extremities  Assessment and plan:  # Chronic back pain, acute flare Tramadol and Robaxin Avoiding NSAIDs given age as well as steroids given diabetes Discussed heat and supportive care as well Check for urinary tract infection given recent pyelonephritis, however her symptoms are few. Return to clinic for any worsening, failure to improve, or other concerning symptoms    Orders Placed This Encounter  Procedures  . Urinalysis, Complete    Meds ordered this encounter  Medications  . methocarbamol (ROBAXIN) 500 MG tablet    Sig: TAKE ONE TABLET BY MOUTH EVERY 6 HOURS AS NEEDED FOR MUSCLE SPASM    Dispense:  60 tablet    Refill:  2  . traMADol (ULTRAM) 50 MG tablet    Sig: Take 1 tablet (50 mg total) by mouth every 8 (eight)  hours as needed.    Dispense:  30 tablet    Refill:  0    Murtis SinkSam Leanny Moeckel, MD [facility] 07/02/2015, 8:21 AM     Addendum Urinalysis is consistent with UTI Sent Omnicef based on last urine culture, it is sensitive to third-generation, Rocephin, Multiple resistances Nursing has updated the patient  Murtis SinkSam Tramayne Sebesta, MD [facility] 07/02/2015, 11:53 AM

## 2015-08-01 ENCOUNTER — Inpatient Hospital Stay (HOSPITAL_COMMUNITY)
Admission: EM | Admit: 2015-08-01 | Discharge: 2015-08-03 | DRG: 064 | Disposition: A | Discharge status: Home Health | Payer: PPO | Attending: Internal Medicine | Admitting: Internal Medicine

## 2015-08-01 ENCOUNTER — Emergency Department (HOSPITAL_COMMUNITY): Payer: PPO

## 2015-08-01 ENCOUNTER — Observation Stay (HOSPITAL_COMMUNITY): Payer: PPO

## 2015-08-01 ENCOUNTER — Encounter (HOSPITAL_COMMUNITY): Payer: Self-pay | Admitting: Emergency Medicine

## 2015-08-01 DIAGNOSIS — Z7984 Long term (current) use of oral hypoglycemic drugs: Secondary | ICD-10-CM

## 2015-08-01 DIAGNOSIS — E1159 Type 2 diabetes mellitus with other circulatory complications: Secondary | ICD-10-CM | POA: Diagnosis present

## 2015-08-01 DIAGNOSIS — J439 Emphysema, unspecified: Secondary | ICD-10-CM | POA: Diagnosis not present

## 2015-08-01 DIAGNOSIS — I10 Essential (primary) hypertension: Secondary | ICD-10-CM | POA: Diagnosis not present

## 2015-08-01 DIAGNOSIS — I6782 Cerebral ischemia: Secondary | ICD-10-CM | POA: Diagnosis not present

## 2015-08-01 DIAGNOSIS — E039 Hypothyroidism, unspecified: Secondary | ICD-10-CM | POA: Diagnosis present

## 2015-08-01 DIAGNOSIS — I6523 Occlusion and stenosis of bilateral carotid arteries: Secondary | ICD-10-CM | POA: Diagnosis not present

## 2015-08-01 DIAGNOSIS — E1142 Type 2 diabetes mellitus with diabetic polyneuropathy: Secondary | ICD-10-CM | POA: Diagnosis present

## 2015-08-01 DIAGNOSIS — Z87891 Personal history of nicotine dependence: Secondary | ICD-10-CM

## 2015-08-01 DIAGNOSIS — I152 Hypertension secondary to endocrine disorders: Secondary | ICD-10-CM | POA: Diagnosis present

## 2015-08-01 DIAGNOSIS — G459 Transient cerebral ischemic attack, unspecified: Secondary | ICD-10-CM | POA: Diagnosis present

## 2015-08-01 DIAGNOSIS — I639 Cerebral infarction, unspecified: Principal | ICD-10-CM | POA: Diagnosis present

## 2015-08-01 DIAGNOSIS — R079 Chest pain, unspecified: Secondary | ICD-10-CM | POA: Diagnosis not present

## 2015-08-01 DIAGNOSIS — M549 Dorsalgia, unspecified: Secondary | ICD-10-CM | POA: Diagnosis not present

## 2015-08-01 DIAGNOSIS — G8929 Other chronic pain: Secondary | ICD-10-CM | POA: Diagnosis present

## 2015-08-01 DIAGNOSIS — E785 Hyperlipidemia, unspecified: Secondary | ICD-10-CM | POA: Diagnosis present

## 2015-08-01 DIAGNOSIS — E119 Type 2 diabetes mellitus without complications: Secondary | ICD-10-CM

## 2015-08-01 DIAGNOSIS — J440 Chronic obstructive pulmonary disease with acute lower respiratory infection: Secondary | ICD-10-CM | POA: Diagnosis not present

## 2015-08-01 DIAGNOSIS — Z7951 Long term (current) use of inhaled steroids: Secondary | ICD-10-CM

## 2015-08-01 DIAGNOSIS — J811 Chronic pulmonary edema: Secondary | ICD-10-CM | POA: Diagnosis not present

## 2015-08-01 DIAGNOSIS — J189 Pneumonia, unspecified organism: Secondary | ICD-10-CM

## 2015-08-01 DIAGNOSIS — R509 Fever, unspecified: Secondary | ICD-10-CM

## 2015-08-01 DIAGNOSIS — R410 Disorientation, unspecified: Secondary | ICD-10-CM | POA: Diagnosis not present

## 2015-08-01 LAB — CBC
HEMATOCRIT: 37.9 % (ref 36.0–46.0)
HEMOGLOBIN: 12.3 g/dL (ref 12.0–15.0)
MCH: 27.7 pg (ref 26.0–34.0)
MCHC: 32.5 g/dL (ref 30.0–36.0)
MCV: 85.4 fL (ref 78.0–100.0)
Platelets: 247 10*3/uL (ref 150–400)
RBC: 4.44 MIL/uL (ref 3.87–5.11)
RDW: 13.3 % (ref 11.5–15.5)
WBC: 16.6 10*3/uL — ABNORMAL HIGH (ref 4.0–10.5)

## 2015-08-01 LAB — BASIC METABOLIC PANEL
Anion gap: 11 (ref 5–15)
Anion gap: 9 (ref 5–15)
BUN: 13 mg/dL (ref 6–20)
BUN: 10 mg/dL (ref 6–20)
CHLORIDE: 95 mmol/L — AB (ref 101–111)
CO2: 28 mmol/L (ref 22–32)
CO2: 31 mmol/L (ref 22–32)
CREATININE: 0.92 mg/dL (ref 0.44–1.00)
Calcium: 9.9 mg/dL (ref 8.9–10.3)
Calcium: 9.6 mg/dL (ref 8.9–10.3)
Chloride: 95 mmol/L — ABNORMAL LOW (ref 101–111)
Creatinine, Ser: 0.86 mg/dL (ref 0.44–1.00)
GFR calc Af Amer: >60 mL/min (ref >60)
GFR calc non Af Amer: >60 mL/min (ref >60)
GLUCOSE: 126 mg/dL — AB (ref 65–99)
GLUCOSE: 121 mg/dL — AB (ref 65–99)
POTASSIUM: 3.2 mmol/L — AB (ref 3.5–5.1)
Potassium: 2.7 mmol/L — CL (ref 3.5–5.1)
Sodium: 134 mmol/L — ABNORMAL LOW (ref 135–145)
Sodium: 135 mmol/L (ref 135–145)

## 2015-08-01 LAB — CBC WITH DIFFERENTIAL/PLATELET
Basophils Absolute: 0 10*3/uL (ref 0.0–0.1)
Basophils Relative: 0 %
EOS PCT: 2 %
Eosinophils Absolute: 0.4 10*3/uL (ref 0.0–0.7)
HEMATOCRIT: 39.2 % (ref 36.0–46.0)
Hemoglobin: 12.8 g/dL (ref 12.0–15.0)
LYMPHS ABS: 1.7 10*3/uL (ref 0.7–4.0)
LYMPHS PCT: 10 %
MCH: 27.8 pg (ref 26.0–34.0)
MCHC: 32.7 g/dL (ref 30.0–36.0)
MCV: 85 fL (ref 78.0–100.0)
Monocytes Absolute: 1.9 10*3/uL — ABNORMAL HIGH (ref 0.1–1.0)
Monocytes Relative: 10 %
Neutro Abs: 14 10*3/uL — ABNORMAL HIGH (ref 1.7–7.7)
Neutrophils Relative %: 78 %
PLATELETS: 267 10*3/uL (ref 150–400)
RBC: 4.61 MIL/uL (ref 3.87–5.11)
RDW: 13.3 % (ref 11.5–15.5)
WBC: 18 10*3/uL — ABNORMAL HIGH (ref 4.0–10.5)

## 2015-08-01 LAB — TROPONIN I

## 2015-08-01 LAB — URINALYSIS, ROUTINE W REFLEX MICROSCOPIC
Bilirubin Urine: NEGATIVE
GLUCOSE, UA: NEGATIVE mg/dL
HGB URINE DIPSTICK: NEGATIVE
Ketones, ur: NEGATIVE mg/dL
Nitrite: NEGATIVE
PH: 7 (ref 5.0–8.0)
PROTEIN: NEGATIVE mg/dL
Specific Gravity, Urine: 1.01 (ref 1.005–1.030)

## 2015-08-01 LAB — GLUCOSE, CAPILLARY: GLUCOSE-CAPILLARY: 143 mg/dL — AB (ref 65–99)

## 2015-08-01 LAB — URINE MICROSCOPIC-ADD ON

## 2015-08-01 LAB — LACTIC ACID, PLASMA: Lactic Acid, Venous: 1.3 mmol/L (ref 0.5–2.0)

## 2015-08-01 LAB — CBG MONITORING, ED: Glucose-Capillary: 137 mg/dL — ABNORMAL HIGH (ref 65–99)

## 2015-08-01 MED ORDER — ENOXAPARIN SODIUM 40 MG/0.4ML ~~LOC~~ SOLN
40.0000 mg | SUBCUTANEOUS | Status: DC
  Administered 2015-08-01: 40 mg via SUBCUTANEOUS
  Filled 2015-08-01: qty 0.4

## 2015-08-01 MED ORDER — ACETAMINOPHEN 325 MG PO TABS
650.0000 mg | ORAL_TABLET | Freq: Four times a day (QID) | ORAL | Status: DC | PRN
  Administered 2015-08-01: 650 mg via ORAL
  Filled 2015-08-01 (×2): qty 2

## 2015-08-01 MED ORDER — STROKE: EARLY STAGES OF RECOVERY BOOK
Freq: Once | Status: DC
  Filled 2015-08-01: qty 1

## 2015-08-01 MED ORDER — HYDROCHLOROTHIAZIDE 25 MG PO TABS
25.0000 mg | ORAL_TABLET | Freq: Every day | ORAL | Status: DC
  Administered 2015-08-02 – 2015-08-03 (×3): 25 mg via ORAL
  Filled 2015-08-01 (×2): qty 1

## 2015-08-01 MED ORDER — LOSARTAN POTASSIUM 50 MG PO TABS
100.0000 mg | ORAL_TABLET | Freq: Every day | ORAL | Status: DC
  Administered 2015-08-02 – 2015-08-03 (×2): 100 mg via ORAL
  Filled 2015-08-01 (×2): qty 2

## 2015-08-01 MED ORDER — LEVOTHYROXINE SODIUM 75 MCG PO TABS
75.0000 ug | ORAL_TABLET | Freq: Every day | ORAL | Status: DC
  Administered 2015-08-02 – 2015-08-03 (×2): 75 ug via ORAL
  Filled 2015-08-01: qty 1

## 2015-08-01 MED ORDER — ONDANSETRON HCL 4 MG/2ML IJ SOLN
4.0000 mg | Freq: Three times a day (TID) | INTRAMUSCULAR | Status: AC | PRN
Start: 2015-08-01 — End: 2015-08-02

## 2015-08-01 MED ORDER — INSULIN ASPART 100 UNIT/ML ~~LOC~~ SOLN: 0.0000 [IU] | Freq: Every day | SUBCUTANEOUS | Status: DC

## 2015-08-01 MED ORDER — POTASSIUM CHLORIDE CRYS ER 20 MEQ PO TBCR
40.0000 meq | EXTENDED_RELEASE_TABLET | Freq: Once | ORAL | Status: AC
  Administered 2015-08-01: 40 meq via ORAL
  Filled 2015-08-01: qty 2

## 2015-08-01 MED ORDER — ENOXAPARIN SODIUM 100 MG/ML ~~LOC~~ SOLN: 1.0000 mg/kg | SUBCUTANEOUS | Status: DC

## 2015-08-01 MED ORDER — ATORVASTATIN CALCIUM 40 MG PO TABS
40.0000 mg | ORAL_TABLET | Freq: Every day | ORAL | Status: DC
  Administered 2015-08-02 – 2015-08-03 (×2): 40 mg via ORAL
  Filled 2015-08-01 (×2): qty 1

## 2015-08-01 MED ORDER — FLUTICASONE FUROATE-VILANTEROL 100-25 MCG/INH IN AEPB
1.0000 | INHALATION_SPRAY | Freq: Every day | RESPIRATORY_TRACT | Status: DC
  Administered 2015-08-03: 1 via RESPIRATORY_TRACT
  Filled 2015-08-01: qty 28

## 2015-08-01 MED ORDER — GABAPENTIN 300 MG PO CAPS
600.0000 mg | ORAL_CAPSULE | Freq: Three times a day (TID) | ORAL | Status: DC
  Administered 2015-08-01 – 2015-08-03 (×5): 600 mg via ORAL
  Filled 2015-08-01 (×4): qty 2

## 2015-08-01 MED ORDER — CLOPIDOGREL BISULFATE 75 MG PO TABS
75.0000 mg | ORAL_TABLET | Freq: Every day | ORAL | Status: DC
  Administered 2015-08-01 – 2015-08-03 (×3): 75 mg via ORAL
  Filled 2015-08-01 (×3): qty 1

## 2015-08-01 MED ORDER — INSULIN ASPART 100 UNIT/ML ~~LOC~~ SOLN
0.0000 [IU] | Freq: Three times a day (TID) | SUBCUTANEOUS | Status: DC
  Administered 2015-08-02 (×3): 3 [IU] via SUBCUTANEOUS
  Administered 2015-08-03: 2 [IU] via SUBCUTANEOUS

## 2015-08-01 MED ORDER — AMLODIPINE BESYLATE 5 MG PO TABS
2.5000 mg | ORAL_TABLET | Freq: Every day | ORAL | Status: DC
  Administered 2015-08-02 – 2015-08-03 (×2): 2.5 mg via ORAL
  Filled 2015-08-01 (×2): qty 1

## 2015-08-01 MED ORDER — ALBUTEROL SULFATE (2.5 MG/3ML) 0.083% IN NEBU
2.5000 mg | INHALATION_SOLUTION | Freq: Once | RESPIRATORY_TRACT | Status: AC
  Administered 2015-08-02: 2.5 mg via RESPIRATORY_TRACT
  Filled 2015-08-01: qty 3

## 2015-08-01 MED ORDER — GABAPENTIN 600 MG PO TABS
600.0000 mg | ORAL_TABLET | Freq: Three times a day (TID) | ORAL | Status: DC
  Filled 2015-08-01 (×3): qty 1

## 2015-08-01 MED ORDER — SENNOSIDES-DOCUSATE SODIUM 8.6-50 MG PO TABS: 1.0000 | ORAL_TABLET | Freq: Every evening | ORAL | Status: DC | PRN

## 2015-08-01 MED ORDER — INSULIN ASPART 100 UNIT/ML ~~LOC~~ SOLN
4.0000 [IU] | Freq: Three times a day (TID) | SUBCUTANEOUS | Status: DC
  Administered 2015-08-02: 4 [IU] via SUBCUTANEOUS
  Administered 2015-08-02: 3 [IU] via SUBCUTANEOUS
  Administered 2015-08-02 – 2015-08-03 (×2): 4 [IU] via SUBCUTANEOUS

## 2015-08-01 MED ORDER — MORPHINE SULFATE (PF) 2 MG/ML IV SOLN
2.0000 mg | Freq: Once | INTRAVENOUS | Status: AC
  Administered 2015-08-01: 2 mg via INTRAVENOUS
  Filled 2015-08-01: qty 1

## 2015-08-01 MED ORDER — AMITRIPTYLINE HCL 25 MG PO TABS
100.0000 mg | ORAL_TABLET | Freq: Every day | ORAL | Status: DC
  Administered 2015-08-01 – 2015-08-02 (×2): 100 mg via ORAL
  Filled 2015-08-01 (×3): qty 4

## 2015-08-01 MED ORDER — NITROGLYCERIN 0.4 MG SL SUBL
0.4000 mg | SUBLINGUAL_TABLET | SUBLINGUAL | Status: DC | PRN
  Administered 2015-08-01 (×3): 0.4 mg via SUBLINGUAL
  Filled 2015-08-01 (×2): qty 1

## 2015-08-01 MED ORDER — LOSARTAN POTASSIUM-HCTZ 100-25 MG PO TABS: 1.0000 | ORAL_TABLET | Freq: Every day | ORAL | Status: DC

## 2015-08-01 NOTE — ED Notes (Addendum)
Pt states she woke up this morning with inability to walk.  States both legs aren't working correctly.  Pt states she normally walks with a cane or walker but when assisted from wheelchair was trying to figure out how to move legs but could not coordinate any movements.  Pt states her family made her come and she has no complaints.  Grandson states pt was off balance and unable to hold her coffee when she woke up.  States she kept falling into the wall on the right side while walking, but it was more her movements were uncoordinated.  Also reports a fever this morning when waking.

## 2015-08-01 NOTE — H&P (Signed)
History and Physical    Laura AlarVerna Dianne Digiulio ZOX:[id number] DOB: 07/20/1950 DOA: 08/01/2015  Referring MD/NP/PA: Nelva Nayobert Beaton, M.D. PCP: Elige RadonJoshua A Dettinger, MD  Patient coming from: Home  Chief Complaint: Confusion, slurred speech  HPI: Laura Dalton is a 65 y.o. female with history of hypertension, type 2 diabetes, hypothyroidism and COPD who presents to the hospital with the above complaints. Patient is not really clear on exactly what happened. She states she awoke in her normal state of health and went around her regular routine and she states that her son "overreacted" and brought her to the hospital for evaluation. Patient's son who was present at time of my exam, tells me that his brother called him this morning and told him that his mother seemed strange, was confused, asking about people who had died a while back, noticed that she had slurred speech and her mouth was drooping on the left side. The son who was present at time of my exam arrived to the house about 45 minutes later and noticed that his mother seemed to be close to her normal self but decided to bring her into the hospital for evaluation to be on the safe side. In the emergency department her vital signs are within normal limits, labs are normal with the exception of a potassium of 3.2 and a WBC count of 18.0, urine analysis is negative for UTI, chest x-ray without acute disease, CT scan of the head without acute abnormality, we have been asked to admit her for further evaluation and management.   Past Medical History  Diagnosis Date  . Bursitis of hip   . Low back pain     benign turmor back  . COPD (chronic obstructive pulmonary disease) (HCC)   . Hypothyroidism   . Vitamin D deficiency disease   . Hypertension   . Diabetes mellitus without complication (HCC)     type II    Past Surgical History  Procedure Laterality Date  . Back surgery  1996    turmor on back   . Appendectomy    . Cholecystectomy    . Leg  surgery Right     "clot and had drain in leg  rt lower leg     reports that she quit smoking about 7 years ago. Her smoking use included Cigarettes. She started smoking about 52 years ago. She has a 22.5 pack-year smoking history. She has never used smokeless tobacco. She reports that she does not drink alcohol or use illicit drugs.  Allergies  Allergen Reactions  . Aspirin Hives    Family History  Problem Relation Age of Onset  . Diabetes Mother   . Cancer Father     Prior to Admission medications   Medication Sig Start Date End Date Taking? Authorizing Provider  amitriptyline (ELAVIL) 100 MG tablet Take 1 tablet (100 mg total) by mouth at bedtime. 04/30/14  Yes Junie Spencerhristy A Hawks, FNP  amLODipine (NORVASC) 2.5 MG tablet TAKE ONE TABLET BY MOUTH ONCE DAILY 05/07/15  Yes Elige RadonJoshua A Dettinger, MD  atorvastatin (LIPITOR) 40 MG tablet Take 1 tablet (40 mg total) by mouth daily. 03/12/15  Yes Christy A Hawks, FNP  fluticasone furoate-vilanterol (BREO ELLIPTA) 100-25 MCG/INH AEPB Inhale 1 puff into the lungs daily. 04/30/15  Yes Elige RadonJoshua A Dettinger, MD  Fluticasone-Salmeterol (ADVAIR DISKUS) 250-50 MCG/DOSE AEPB Inhale 1 puff into the lungs as needed (FOR SHORTNESS OF BREATH). Reported on 04/30/2015   Yes Historical Provider, MD  gabapentin (NEURONTIN) 600 MG tablet TAKE  ONE TABLET BY MOUTH THREE TIMES DAILY 05/03/15  Yes Elige Radon Dettinger, MD  levothyroxine (SYNTHROID) 75 MCG tablet Take 1 tablet (75 mcg total) by mouth daily before breakfast. 03/17/15  Yes Junie Spencer, FNP  losartan-hydrochlorothiazide (HYZAAR) 100-25 MG tablet Take 1 tablet by mouth daily. 03/12/15  Yes Junie Spencer, FNP  metFORMIN (GLUCOPHAGE) 500 MG tablet TAKE ONE TABLET BY MOUTh once DAILY 04/30/14  Yes Junie Spencer, FNP  cefdinir (OMNICEF) 300 MG capsule Take 1 capsule (300 mg total) by mouth 2 (two) times daily. 1 po BID Patient not taking: Reported on 08/01/2015 07/02/15   Elenora Gamma, MD  methocarbamol (ROBAXIN)  500 MG tablet TAKE ONE TABLET BY MOUTH EVERY 6 HOURS AS NEEDED FOR MUSCLE SPASM Patient not taking: Reported on 08/01/2015 07/02/15   Elenora Gamma, MD  traMADol (ULTRAM) 50 MG tablet Take 1 tablet (50 mg total) by mouth every 8 (eight) hours as needed. Patient not taking: Reported on 08/01/2015 07/02/15   Elenora Gamma, MD    Review of Systems: Constitutional: Denies fever, chills, diaphoresis, appetite change and fatigue.  HEENT: Denies photophobia, eye pain, redness, hearing loss, ear pain, congestion, sore throat, rhinorrhea, sneezing, mouth sores, trouble swallowing, neck pain, neck stiffness and tinnitus.   Respiratory: Denies SOB, DOE, cough, chest tightness,  and wheezing.   Cardiovascular: Denies chest pain, palpitations and leg swelling.  Gastrointestinal: Denies nausea, vomiting, abdominal pain, diarrhea, constipation, blood in stool and abdominal distention.  Genitourinary: Denies dysuria, urgency, frequency, hematuria, flank pain and difficulty urinating.  Endocrine: Denies: hot or cold intolerance, sweats, changes in hair or nails, polyuria, polydipsia. Musculoskeletal: Denies myalgias, back pain, joint swelling, arthralgias and gait problem.  Skin: Denies pallor, rash and wound.  Neurological: Denies dizziness, seizures, syncope, weakness, light-headedness, numbness and headaches.  Hematological: Denies adenopathy. Easy bruising, personal or family bleeding history  Psychiatric/Behavioral: Denies suicidal ideation, mood changes, confusion, nervousness, sleep disturbance and agitation    Physical Exam: Filed Vitals:   08/01/15 1350 08/01/15 1400 08/01/15 1430 08/01/15 1500  BP:  118/71 121/55 93/63  Pulse:  98 97   Temp: 99.2 F (37.3 C)     TempSrc:      Resp:  SpO2:  94% 96%       Constitutional: NAD, calm, comfortable Eyes: PERRL, lids and conjunctivae normal ENMT: Mucous membranes are moist. Posterior pharynx clear of any exudate or lesions.Normal  dentition.  Neck: normal, supple, no masses, no thyromegaly Respiratory: clear to auscultation bilaterally, no wheezing, no crackles. Normal respiratory effort. No accessory muscle use.  Cardiovascular: Regular rate and rhythm, no murmurs / rubs / gallops. No extremity edema. 2+ pedal pulses. No carotid bruits.  Abdomen: no tenderness, no masses palpated. No hepatosplenomegaly. Bowel sounds positive.  Musculoskeletal: no clubbing / cyanosis. No joint deformity upper and lower extremities. Good ROM, no contractures. Normal muscle tone.  Skin: no rashes, lesions, ulcers. No induration Neurologic: CN 2-12 grossly intact. Sensation intact, DTR normal. Strength 5/5 in all 4.  Psychiatric: Normal judgment and insight. Alert and oriented x 3. Normal mood.    Labs on Admission: I have personally reviewed following labs and imaging studies  CBC:  Recent Labs Lab 08/01/15 1220  WBC 18.0*  NEUTROABS 14.0*  HGB 12.8  HCT 39.2  MCV 85.0  PLT 267   Basic Metabolic Panel:  Recent Labs Lab 08/01/15 1220  NA 134*  K 3.2*  CL 95*  CO2 28  GLUCOSE 126*  BUN 13  CREATININE 0.86  CALCIUM 9.9   GFR: CrCl cannot be calculated (Unknown ideal weight.). Liver Function Tests: No results for input(s): AST, ALT, ALKPHOS, BILITOT, PROT, ALBUMIN in the last 168 hours. No results for input(s): LIPASE, AMYLASE in the last 168 hours. No results for input(s): AMMONIA in the last 168 hours. Coagulation Profile: No results for input(s): INR, PROTIME in the last 168 hours. Cardiac Enzymes: No results for input(s): CKTOTAL, CKMB, CKMBINDEX, TROPONINI in the last 168 hours. BNP (last 3 results) No results for input(s): PROBNP in the last 8760 hours. HbA1C: No results for input(s): HGBA1C in the last 72 hours. CBG:  Recent Labs Lab 08/01/15 1202  GLUCAP 137*   Lipid Profile: No results for input(s): CHOL, HDL, LDLCALC, TRIG, CHOLHDL, LDLDIRECT in the last 72 hours. Thyroid Function Tests: No  results for input(s): TSH, T4TOTAL, FREET4, T3FREE, THYROIDAB in the last 72 hours. Anemia Panel: No results for input(s): VITAMINB12, FOLATE, FERRITIN, TIBC, IRON, RETICCTPCT in the last 72 hours. Urine analysis:    Component Value Date/Time   COLORURINE YELLOW 08/01/2015 1225   APPEARANCEUR CLEAR 08/01/2015 1225   APPEARANCEUR Clear 07/02/2015 0837   LABSPEC 1.010 08/01/2015 1225   PHURINE 7.0 08/01/2015 1225   GLUCOSEU NEGATIVE 08/01/2015 1225   HGBUR NEGATIVE 08/01/2015 1225   BILIRUBINUR NEGATIVE 08/01/2015 1225   BILIRUBINUR Negative 07/02/2015 0837   BILIRUBINUR neg 04/30/2015 1313   KETONESUR NEGATIVE 08/01/2015 1225   PROTEINUR NEGATIVE 08/01/2015 1225   PROTEINUR Negative 07/02/2015 0837   PROTEINUR neg 04/30/2015 1313   UROBILINOGEN negative 04/30/2015 1313   NITRITE NEGATIVE 08/01/2015 1225   NITRITE Negative 07/02/2015 0837   NITRITE neg 04/30/2015 1313   LEUKOCYTESUR TRACE* 08/01/2015 1225   LEUKOCYTESUR 1+* 07/02/2015 0837   Sepsis Labs: @LABRCNTIP (procalcitonin:4,lacticidven:4) )No results found for this or any previous visit (from the past 240 hour(s)).   Radiological Exams on Admission: Dg Chest 2 View  08/01/2015  CLINICAL DATA:  Weakness over the last several days. EXAM: CHEST  2 VIEW COMPARISON:  One-view chest x-ray 04/13/2014.  CTA chest 04/14/2015. FINDINGS: The heart is mildly enlarged. Atherosclerotic changes are present at the aortic arch. There is no significant interval change. Emphysematous changes are noted. No focal airspace disease present. There is no edema or effusion to suggest failure. IMPRESSION: 1. No acute cardiopulmonary disease or significant oval change. 2. Emphysema. 3. Stable mild cardiomegaly without failure. 4. Atherosclerosis. Electronically Signed   By: Marin Roberts M.D.   On: 08/01/2015 14:04   Ct Head Wo Contrast  08/01/2015  CLINICAL DATA:  Weakness. EXAM: CT HEAD WITHOUT CONTRAST TECHNIQUE: Contiguous axial images were  obtained from the base of the skull through the vertex without intravenous contrast. COMPARISON:  None. FINDINGS: No evidence of parenchymal hemorrhage or extra-axial fluid collection. No mass lesion, mass effect, or midline shift. No CT evidence of acute infarction. Intracranial atherosclerosis. Nonspecific mild subcortical and periventricular white matter hypodensity, most in keeping with chronic small vessel ischemic change. Cerebral volume is age appropriate. No ventriculomegaly. The visualized paranasal sinuses are essentially clear. The mastoid air cells are unopacified. No evidence of calvarial fracture. IMPRESSION: 1.  No evidence of acute intracranial abnormality. 2. Mild chronic small vessel ischemia. Electronically Signed   By: Delbert Phenix M.D.   On: 08/01/2015 14:14    EKG: Independently reviewed. Sinus rhythm, right axis deviation, no acute ischemic abnormalities  Assessment/Plan Principal Problem:   TIA (transient ischemic attack) Active Problems:   Chronic back pain  HTN (hypertension)   DM (diabetes mellitus) (HCC)   Diabetic peripheral neuropathy associated with type 2 diabetes mellitus (HCC)   Hypothyroidism    Transient ischemic attack -Her symptoms most likely represent this, she does have risk factors for vascular disease including hypertension and diabetes. -Plan to admit, obtain MRI of the brain, 2-D echo, carotid Dopplers, lipid profile. -We'll request PT evaluation. -She has anaphylactic reaction to aspirin, will start Plavix for secondary stroke prevention.  Hypertension -Fair control, continue home medications.  Type 2 diabetes -Check hemoglobin A1c, place on sliding scale insulin, hold metformin while in the hospital.  Hypothyroidism -Continue home dose of Synthroid, check TSH.   DVT prophylaxis: Lovenox  Code Status: Full code  Family Communication: Son and daughter-in-law at bedside updated on plan of care and all questions answered  Disposition  Plan: Anticipate discharge home in 24 hours  Consults called: None  Admission status: Observation    Time Spent: 85 minutes  Chaya Jan MD [facility] Pager [phone]  If 7PM-7AM, please contact night-coverage [email or web address] Password [facility]  08/01/2015, 3:43 PM

## 2015-08-01 NOTE — ED Provider Notes (Signed)
CSN: [id number]     Arrival date & time 08/01/15  1154 History  By signing my name below, I, Sonum Patel, attest that this documentation has been prepared under the direction and in the presence of Nelva Nay, MD. Electronically Signed: Sonum Patel, Scribe. 08/01/2015. 12:06 PM.    Chief Complaint  Patient presents with  . Weakness   The history is provided by the patient and a relative. No language interpreter was used.     HPI Comments: Laura Dalton is a 65 y.o. female with past medical history of low back pain, DM, HTN who presents to the Emergency Department complaining of difficulty with ambulation that worsened after waking this morning. Grandson reports patient was mumbling and barely opening mouth to talk this morning; reports slurred speech and states he had difficulty comprehending her conversation. He states these speech difficulties have resolved since arriving to the ED and states her speech is back to baseline. Patient has used a cane since August 2016 due to back pain but has used it more frequently since January/February 2017 after being hospitalized for a UTI. Patient states she takes unspecified pain medication for her back pain; last dose was yesterday. She states the back pain is not present at rest.   Past Medical History  Diagnosis Date  . Bursitis of hip   . Low back pain     benign turmor back  . COPD (chronic obstructive pulmonary disease) (HCC)   . Hypothyroidism   . Vitamin D deficiency disease   . Hypertension   . Diabetes mellitus without complication (HCC)     type II   Past Surgical History  Procedure Laterality Date  . Back surgery  1996    turmor on back   . Appendectomy    . Cholecystectomy    . Leg surgery Right     "clot and had drain in leg  rt lower leg   Family History  Problem Relation Age of Onset  . Diabetes Mother   . Cancer Father    Social History  Substance Use Topics  . Smoking status: Former Smoker -- 0.50 packs/day for  45 years    Types: Cigarettes    Start date: 08/15/1962    Quit date: 12/01/2007  . Smokeless tobacco: Never Used  . Alcohol Use: No   OB History    No data available     Review of Systems  A complete 10 system review of systems was obtained and all systems are negative except as noted in the HPI and PMH.    Allergies  Aspirin  Home Medications   Prior to Admission medications   Medication Sig Start Date End Date Taking? Authorizing Provider  amitriptyline (ELAVIL) 100 MG tablet Take 1 tablet (100 mg total) by mouth at bedtime. 04/30/14  Yes Junie Spencer, FNP  amLODipine (NORVASC) 2.5 MG tablet TAKE ONE TABLET BY MOUTH ONCE DAILY 05/07/15  Yes Elige Radon Dettinger, MD  atorvastatin (LIPITOR) 40 MG tablet Take 1 tablet (40 mg total) by mouth daily. 03/12/15  Yes Christy A Hawks, FNP  fluticasone furoate-vilanterol (BREO ELLIPTA) 100-25 MCG/INH AEPB Inhale 1 puff into the lungs daily. 04/30/15  Yes Elige Radon Dettinger, MD  Fluticasone-Salmeterol (ADVAIR DISKUS) 250-50 MCG/DOSE AEPB Inhale 1 puff into the lungs as needed (FOR SHORTNESS OF BREATH). Reported on 04/30/2015   Yes Historical Provider, MD  gabapentin (NEURONTIN) 600 MG tablet TAKE ONE TABLET BY MOUTH THREE TIMES DAILY 05/03/15  Yes Nils Pyle, MD  levothyroxine (SYNTHROID) 75 MCG tablet Take 1 tablet (75 mcg total) by mouth daily before breakfast. 03/17/15  Yes Junie Spencerhristy A Hawks, FNP  losartan-hydrochlorothiazide (HYZAAR) 100-25 MG tablet Take 1 tablet by mouth daily. 03/12/15  Yes Junie Spencerhristy A Hawks, FNP  metFORMIN (GLUCOPHAGE) 500 MG tablet TAKE ONE TABLET BY MOUTh once DAILY 04/30/14  Yes Junie Spencerhristy A Hawks, FNP  cefdinir (OMNICEF) 300 MG capsule Take 1 capsule (300 mg total) by mouth 2 (two) times daily. 1 po BID Patient not taking: Reported on 08/01/2015 07/02/15   Elenora GammaSamuel L Bradshaw, MD  methocarbamol (ROBAXIN) 500 MG tablet TAKE ONE TABLET BY MOUTH EVERY 6 HOURS AS NEEDED FOR MUSCLE SPASM Patient not taking: Reported on  08/01/2015 07/02/15   Elenora GammaSamuel L Bradshaw, MD  traMADol (ULTRAM) 50 MG tablet Take 1 tablet (50 mg total) by mouth every 8 (eight) hours as needed. Patient not taking: Reported on 08/01/2015 07/02/15   Elenora GammaSamuel L Bradshaw, MD   BP 121/55 mmHg  Pulse 97  Temp(Src) 99.2 F (37.3 C) (Oral)  Resp 18  SpO2 96% Physical Exam  Constitutional: She is oriented to person, place, and time. She appears well-developed and well-nourished. No distress.  HENT:  Head: Normocephalic and atraumatic.  Eyes: Pupils are equal, round, and reactive to light.  Neck: Normal range of motion.  Cardiovascular: Normal rate and intact distal pulses.   Pulmonary/Chest: No respiratory distress.  Abdominal: Normal appearance. She exhibits no distension.  Musculoskeletal: Normal range of motion.  Neurological: She is alert and oriented to person, place, and time. A cranial nerve deficit (Slight droop left corner of mouth) is present. GCS eye subscore is 4. GCS verbal subscore is 5. GCS motor subscore is 6.  No arm drift noted  Skin: Skin is warm and dry. No rash noted.  Psychiatric: She has a normal mood and affect. Her behavior is normal.  Nursing note and vitals reviewed.   ED Course  Procedures (including critical care time)  DIAGNOSTIC STUDIES: Oxygen Saturation is 100% on RA, normal by my interpretation.    COORDINATION OF CARE: 12:09 PM Discussed treatment plan with pt and family at bedside and they agreed to plan.   Labs Review Labs Reviewed  CBC WITH DIFFERENTIAL/PLATELET - Abnormal; Notable for the following:    WBC 18.0 (*)    Neutro Abs 14.0 (*)    Monocytes Absolute 1.9 (*)    All other components within normal limits  BASIC METABOLIC PANEL - Abnormal; Notable for the following:    Sodium 134 (*)    Potassium 3.2 (*)    Chloride 95 (*)    Glucose, Bld 126 (*)    All other components within normal limits  URINALYSIS, ROUTINE W REFLEX MICROSCOPIC (NOT AT [facility]) - Abnormal; Notable for the following:     Leukocytes, UA TRACE (*)    All other components within normal limits  URINE MICROSCOPIC-ADD ON - Abnormal; Notable for the following:    Squamous Epithelial / LPF 0-5 (*)    Bacteria, UA RARE (*)    All other components within normal limits  CBG MONITORING, ED - Abnormal; Notable for the following:    Glucose-Capillary 137 (*)    All other components within normal limits    Imaging Review Dg Chest 2 View  08/01/2015  CLINICAL DATA:  Weakness over the last several days. EXAM: CHEST  2 VIEW COMPARISON:  One-view chest x-ray 04/13/2014.  CTA chest 04/14/2015. FINDINGS: The heart is mildly enlarged. Atherosclerotic changes are present at the  aortic arch. There is no significant interval change. Emphysematous changes are noted. No focal airspace disease present. There is no edema or effusion to suggest failure. IMPRESSION: 1. No acute cardiopulmonary disease or significant oval change. 2. Emphysema. 3. Stable mild cardiomegaly without failure. 4. Atherosclerosis. Electronically Signed   By: Marin Roberts M.D.   On: 08/01/2015 14:04   Ct Head Wo Contrast  08/01/2015  CLINICAL DATA:  Weakness. EXAM: CT HEAD WITHOUT CONTRAST TECHNIQUE: Contiguous axial images were obtained from the base of the skull through the vertex without intravenous contrast. COMPARISON:  None. FINDINGS: No evidence of parenchymal hemorrhage or extra-axial fluid collection. No mass lesion, mass effect, or midline shift. No CT evidence of acute infarction. Intracranial atherosclerosis. Nonspecific mild subcortical and periventricular white matter hypodensity, most in keeping with chronic small vessel ischemic change. Cerebral volume is age appropriate. No ventriculomegaly. The visualized paranasal sinuses are essentially clear. The mastoid air cells are unopacified. No evidence of calvarial fracture. IMPRESSION: 1.  No evidence of acute intracranial abnormality. 2. Mild chronic small vessel ischemia. Electronically Signed    By: Delbert Phenix M.D.   On: 08/01/2015 14:14   I have personally reviewed and evaluated these images and lab results as part of my medical decision-making.   EKG Interpretation   Date/Time:  Sunday Aug 01 2015 12:02:25 EDT Ventricular Rate:  104 PR Interval:  204 QRS Duration: 92 QT Interval:  348 QTC Calculation: 458 R Axis:   136 Text Interpretation:  Sinus tachycardia Anterolateral infarct, old No  significant change since last tracing Confirmed by Nivea Wojdyla  MD, Tanika Bracco  ([id number]) on 08/01/2015 12:22:45 PM      MDM   Final diagnoses:  Transient cerebral ischemia, unspecified transient cerebral ischemia type    I personally performed the services described in this documentation, which was scribed in my presence. The recorded information has been reviewed and considered.   Nelva Nay, MD 08/01/15 [phone]

## 2015-08-02 ENCOUNTER — Observation Stay (HOSPITAL_BASED_OUTPATIENT_CLINIC_OR_DEPARTMENT_OTHER): Payer: PPO

## 2015-08-02 ENCOUNTER — Observation Stay (HOSPITAL_COMMUNITY): Payer: PPO

## 2015-08-02 DIAGNOSIS — J811 Chronic pulmonary edema: Secondary | ICD-10-CM | POA: Diagnosis not present

## 2015-08-02 DIAGNOSIS — R410 Disorientation, unspecified: Secondary | ICD-10-CM | POA: Diagnosis not present

## 2015-08-02 DIAGNOSIS — G459 Transient cerebral ischemic attack, unspecified: Secondary | ICD-10-CM

## 2015-08-02 DIAGNOSIS — I6782 Cerebral ischemia: Secondary | ICD-10-CM | POA: Diagnosis not present

## 2015-08-02 DIAGNOSIS — J189 Pneumonia, unspecified organism: Secondary | ICD-10-CM

## 2015-08-02 DIAGNOSIS — J440 Chronic obstructive pulmonary disease with acute lower respiratory infection: Secondary | ICD-10-CM | POA: Diagnosis not present

## 2015-08-02 DIAGNOSIS — I639 Cerebral infarction, unspecified: Secondary | ICD-10-CM | POA: Diagnosis present

## 2015-08-02 DIAGNOSIS — E039 Hypothyroidism, unspecified: Secondary | ICD-10-CM | POA: Diagnosis not present

## 2015-08-02 DIAGNOSIS — R079 Chest pain, unspecified: Secondary | ICD-10-CM | POA: Diagnosis not present

## 2015-08-02 DIAGNOSIS — I6523 Occlusion and stenosis of bilateral carotid arteries: Secondary | ICD-10-CM | POA: Diagnosis not present

## 2015-08-02 DIAGNOSIS — Z7984 Long term (current) use of oral hypoglycemic drugs: Secondary | ICD-10-CM | POA: Diagnosis not present

## 2015-08-02 DIAGNOSIS — Z87891 Personal history of nicotine dependence: Secondary | ICD-10-CM | POA: Diagnosis not present

## 2015-08-02 DIAGNOSIS — Z7951 Long term (current) use of inhaled steroids: Secondary | ICD-10-CM | POA: Diagnosis not present

## 2015-08-02 DIAGNOSIS — G8929 Other chronic pain: Secondary | ICD-10-CM | POA: Diagnosis not present

## 2015-08-02 DIAGNOSIS — J439 Emphysema, unspecified: Secondary | ICD-10-CM | POA: Diagnosis not present

## 2015-08-02 DIAGNOSIS — M549 Dorsalgia, unspecified: Secondary | ICD-10-CM | POA: Diagnosis not present

## 2015-08-02 DIAGNOSIS — E1142 Type 2 diabetes mellitus with diabetic polyneuropathy: Secondary | ICD-10-CM | POA: Diagnosis not present

## 2015-08-02 DIAGNOSIS — E785 Hyperlipidemia, unspecified: Secondary | ICD-10-CM | POA: Diagnosis not present

## 2015-08-02 DIAGNOSIS — I10 Essential (primary) hypertension: Secondary | ICD-10-CM | POA: Diagnosis not present

## 2015-08-02 LAB — LIPID PANEL
CHOLESTEROL: 107 mg/dL (ref 0–200)
HDL: 42 mg/dL (ref >40)
LDL Cholesterol: 50 mg/dL (ref 0–99)
TRIGLYCERIDES: 74 mg/dL (ref <150)
Total CHOL/HDL Ratio: 2.5 RATIO
VLDL: 15 mg/dL (ref 0–40)

## 2015-08-02 LAB — ECHOCARDIOGRAM COMPLETE
Height: 66 in
Weight: 3139.2 oz

## 2015-08-02 LAB — MAGNESIUM: Magnesium: 1.8 mg/dL (ref 1.7–2.4)

## 2015-08-02 LAB — GLUCOSE, CAPILLARY
GLUCOSE-CAPILLARY: 131 mg/dL — AB (ref 65–99)
GLUCOSE-CAPILLARY: 117 mg/dL — AB (ref 65–99)
Glucose-Capillary: 160 mg/dL — ABNORMAL HIGH (ref 65–99)
Glucose-Capillary: 152 mg/dL — ABNORMAL HIGH (ref 65–99)

## 2015-08-02 LAB — TROPONIN I
Troponin I: <0.03 ng/mL (ref <0.031)
Troponin I: <0.03 ng/mL (ref <0.031)

## 2015-08-02 LAB — D-DIMER, QUANTITATIVE (NOT AT ARMC): D DIMER QUANT: 1.24 ug{FEU}/mL — AB (ref 0.00–0.50)

## 2015-08-02 LAB — LACTIC ACID, PLASMA: LACTIC ACID, VENOUS: 1.5 mmol/L (ref 0.5–2.0)

## 2015-08-02 MED ORDER — LEVOFLOXACIN IN D5W 750 MG/150ML IV SOLN: 750.0000 mg | Freq: Once | INTRAVENOUS | Status: DC

## 2015-08-02 MED ORDER — IOPAMIDOL (ISOVUE-370) INJECTION 76%
100.0000 mL | Freq: Once | INTRAVENOUS | Status: AC | PRN
  Administered 2015-08-02: 100 mL via INTRAVENOUS

## 2015-08-02 MED ORDER — LEVOFLOXACIN IN D5W 750 MG/150ML IV SOLN
750.0000 mg | Freq: Once | INTRAVENOUS | Status: AC
  Administered 2015-08-02: 750 mg via INTRAVENOUS
  Filled 2015-08-02: qty 150

## 2015-08-02 MED ORDER — ENOXAPARIN SODIUM 60 MG/0.6ML ~~LOC~~ SOLN
50.0000 mg | Freq: Once | SUBCUTANEOUS | Status: AC
  Administered 2015-08-02: 50 mg via SUBCUTANEOUS
  Filled 2015-08-02: qty 0.6

## 2015-08-02 MED ORDER — ENOXAPARIN SODIUM 40 MG/0.4ML ~~LOC~~ SOLN
40.0000 mg | SUBCUTANEOUS | Status: DC
  Administered 2015-08-02: 40 mg via SUBCUTANEOUS
  Filled 2015-08-02 (×2): qty 0.4

## 2015-08-02 MED ORDER — LEVOFLOXACIN IN D5W 750 MG/150ML IV SOLN
750.0000 mg | INTRAVENOUS | Status: DC
  Administered 2015-08-03: 750 mg via INTRAVENOUS
  Filled 2015-08-02: qty 150

## 2015-08-02 MED ORDER — ENOXAPARIN SODIUM 100 MG/ML ~~LOC~~ SOLN: 1.0000 mg/kg | Freq: Two times a day (BID) | SUBCUTANEOUS | Status: DC

## 2015-08-02 NOTE — Evaluation (Signed)
Physical Therapy Evaluation Patient Details Name: Laura Dalton MRN: [id number] DOB: 02-Nov-1950 Today's Date: 08/02/2015   History of Present Illness  65 yo F admitted after episode of slurred speech, and R facial droop.  Head CT (-) for acute changes, CXR (+) PNA, MRI (+) subcentimeter infarct in the subcortical posterior L frontal lobe.  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   Clinical Impression  Pt received in bed, and was agreeable to PT evaluation.  Dtr arrived during tx.  Pt states that she normally lives with her son in a 1 level home.  She is normally independent with ADL's, and IADL's, and uses a cane most of the time.  However, on bad days, she uses a rollator.  Today she required Min guard for supine<>sit, and Min A for sit<>stand with RW.  Pt ambulated 64ft with RW and Min A due to poor safety awareness, RW navigation, and significantly shortened stride on the right.  Pt fatigued easily, and also needed to return to the room due to need to void.  Pt also demonstrates incoordination with R hand, which is her dominant hand.  At this point, she is recommended for 24/7 supervision/assistance at home, with HHPT, and she may possibly need a RW due to rollator allowing for too many degrees of freedom with her poor R foot stride length at this time.     Follow Up Recommendations Home health PT    Equipment Recommendations  Rolling walker with 5" wheels;Other (comment) (Pt has a Rollator at home but due to R LE weakness and poor gait, she may ned RW)    Recommendations for Other Services       Precautions / Restrictions Precautions Precautions: Fall Restrictions Weight Bearing Restrictions: No      Mobility  Bed Mobility Overal bed mobility: Needs Assistance Bed Mobility: Sidelying to Sit    Sidelying to sit: Min guard;HOB elevated       General bed mobility comments: Increased time - pt requesting for PT to assist her, and was encouraged to attempt on her own if possible.    Transfers Overall transfer level: Needs assistance Equipment used: Rolling walker (2 wheeled) Transfers: Sit to/from Stand Sit to Stand: Min assist         General transfer comment: Pt demonstrates poor eccentric control when going stand<>sit on bathroom commode, as well as in the chair.   Ambulation/Gait Ambulation/Gait assistance: Min assist Ambulation Distance (Feet): 30 Feet Assistive device: Rolling walker (2 wheeled) Gait Pattern/deviations: Step-to pattern;Decreased stride length;Trunk flexed     General Gait Details: Pt demonstrates fwd flexed posture at the hips, and head down.  Vc's and tc's required for good upright posture, however pt is not able to maintain this.  Pt demonstrates significantly shorter stride on the R foot, and needs constant vc's to take bigger steps on the right, and to stay in the base of the RW - pt holds it too far in front. Gait distance limited due to pt fatigue - and she stated that she felt like she had gone much further than she actually did.  Distance also limited due to pt's urge to void.   Stairs            Wheelchair Mobility    Modified Rankin (Stroke Patients Only)       Balance  Overall balance assessment: Needs assistance         Standing balance support: Bilateral upper extremity supported Standing balance-Leahy Scale: Fair                               Pertinent Vitals/Pain Pain Assessment: No/denies pain    Home Living   Living Arrangements: Children (Lives with her son who works during the day. ) Available Help at Discharge: Family Type of Home: House Home Access: Stairs to enter;Ramped entrance     Home Layout: One level Home Equipment: Environmental consultant - 4 wheels;Cane - single point;Bedside commode;Wheelchair -  manual      Prior Function Level of Independence: Independent with assistive device(s)   Gait / Transfers Assistance Needed: Pt states that she usually uses a cane except for bad days, when she uses a rollator.    ADL's / Homemaking Assistance Needed: Pt states she is independent with ADL's, IADL's, still driving, and did not have any issues with running errands prior to this admission.          Hand Dominance   Dominant Hand: Right    Extremity/Trunk Assessment   Upper Extremity Assessment: RUE deficits/detail RUE Deficits / Details: Pt demonstrates grossly 3-/5, and diminished fine motor with poor completion of fingertip - thumb on R hand (dominant)         Lower Extremity Assessment: RLE deficits/detail RLE Deficits / Details: Ankle DF: 3-/5, knee extension: 3-/5, and hip flexion: 3-/5    Cervical / Trunk Assessment: Kyphotic  Communication   Communication: No difficulties  Cognition Arousal/Alertness: Awake/alert Behavior During Therapy: WFL for tasks assessed/performed Overall Cognitive Status: Impaired/Different from baseline Area of Impairment: Safety/judgement         Safety/Judgement: Decreased awareness of safety;Decreased awareness of deficits     General Comments: Pt needed to use the restroom during PT tx, and was assisted into the bathroom.  Pt was instructed not to get up by herself, however she was noted to get up from the commode on her own.    General Comments      Exercises        Assessment/Plan    PT Assessment Patient needs continued PT services  PT Diagnosis Difficulty walking;Abnormality of gait;Generalized weakness   PT Problem List Decreased strength;Decreased activity tolerance;Decreased balance;Decreased range of motion;Decreased mobility;Decreased coordination;Decreased knowledge of use of DME;Decreased safety awareness;Decreased knowledge of precautions;Cardiopulmonary status limiting activity;Obesity  PT Treatment Interventions  DME instruction;Gait training;Functional mobility training;Therapeutic activities;Therapeutic exercise;Patient/family education;Balance training   PT Goals (Current goals can be found in the Care Plan section) Acute Rehab PT Goals Patient Stated Goal: Pt wants to go home.  PT Goal Formulation: With patient/family Time For Goal Achievement: 08/09/15 Potential to Achieve Goals: Good    Frequency 7X/week   Barriers to discharge Decreased caregiver support Dtr stated that they would arrange to have someone stay with her all the time at home.     Co-evaluation               End of Session Equipment Utilized During Treatment: Gait belt Activity Tolerance: Patient tolerated treatment well Patient left: in chair;with call bell/phone within reach Nurse Communication: Mobility status (Unable to locate chair alarm box/pad - notified RN.  Re-inforced to call for assist with the pt and her dtr. )    Functional Assessment Tool Used: [employer] AM-PAC "6-clicks"  Functional Limitation: Mobility: Walking and moving around Mobility: Walking and  Moving Around Current Status [phone]([id number]): At least 40 percent but less than 60 percent impaired, limited or restricted Mobility: Walking and Moving Around Goal Status [phone](G8979): At least 20 percent but less than 40 percent impaired, limited or restricted    Time: 1047-1119 PT Time Calculation (min) (ACUTE ONLY): 32 min   Charges:   PT Evaluation $PT Eval Moderate Complexity: 1 Procedure PT Treatments $Gait Training: 8-22 mins   PT G Codes:   PT G-Codes **NOT FOR INPATIENT CLASS** Functional Assessment Tool Used: [employer] AM-PAC "6-clicks"  Functional Limitation: Mobility: Walking and moving around Mobility: Walking and Moving Around Current Status [phone](G8978): At least 40 percent but less than 60 percent impaired, limited or restricted Mobility: Walking and Moving Around Goal Status [phone](G8979): At least 20 percent but less than 40 percent  impaired, limited or restricted    Carollee HerterBeth Sartaj Hoskin, PT, DPT X: [phone]   08/02/2015, 12:45 PM

## 2015-08-02 NOTE — Care Management Note (Signed)
Case Management Note  Patient Details  Name: Laura AlarVerna Dianne Dalton MRN: [id number] Date of Birth: 06/18/1950  Subjective/Objective:                  Pt from home, lives alone and is ind with ADL's. Pt's daughter is at the bedside and will be staying with pt after DC. Pt's grandson is there at night. PT has recommended HH PT, pt is agreeable and [facility] Abby, of [facility], made aware and will obtain pt info from chart. Pt is aware HH has 48 hours to initiate services. No DME needed at this time.   Action/Plan: Anticipate DC home in 1-2 days.   Expected Discharge Date:  08/04/15               Expected Discharge Plan:  Home w Home Health Services  In-House Referral:  NA  Discharge planning Services  CM Consult  Post Acute Care Choice:  Home Health, Resumption of Svcs/PTA Provider Choice offered to:  Patient  DME Arranged:    DME Agency:     HH Arranged:  PT, RN HH Agency:  [facility]  Status of Service:  Completed, signed off  Medicare Important Message Given:    Date Medicare IM Given:    Medicare IM give by:    Date Additional Medicare IM Given:    Additional Medicare Important Message give by:     If discussed at Long Length of Stay Meetings, dates discussed:    Additional Comments:  Malcolm MetroChildress, Nica Friske Demske, RN 08/02/2015, 2:55 PM

## 2015-08-02 NOTE — Evaluation (Signed)
Speech Language Pathology Evaluation Patient Details Name: Laura Dalton MRN: [id number] DOB: 06/02/1950 Today's Date: 08/02/2015 Time: 4098-1191 SLP Time Calculation (min) (ACUTE ONLY): 41 min  Problem List:  Patient Active Problem List   Diagnosis Date Noted  . CAP (community acquired pneumonia) 08/02/2015  . TIA (transient ischemic attack) 08/01/2015  . Pain in the chest   . Low back pain   . Obesity (BMI 30.0-34.9) 03/12/2015  . Hypothyroidism 07/29/2014  . Osteoarthritis of right shoulder 01/21/2014  . Leg length discrepancy 11/04/2013  . Lumbar post-laminectomy syndrome 11/04/2013  . Diabetic peripheral neuropathy associated with type 2 diabetes mellitus (HCC) 11/04/2013  . Dizziness and giddiness 01/06/2013  . COPD (chronic obstructive pulmonary disease) (HCC) 10/01/2012  . Chronic back pain 10/01/2012  . HTN (hypertension) 10/01/2012  . DM (diabetes mellitus) (HCC) 10/01/2012  . Vitamin D deficiency 10/01/2012  . HLD (hyperlipidemia) 10/01/2012  . Tinea unguium 10/01/2012   Past Medical History:  Past Medical History  Diagnosis Date  . Bursitis of hip   . Low back pain     benign turmor back  . COPD (chronic obstructive pulmonary disease) (HCC)   . Hypothyroidism   . Vitamin D deficiency disease   . Hypertension   . Diabetes mellitus without complication (HCC)     type II   Past Surgical History:  Past Surgical History  Procedure Laterality Date  . Back surgery  1996    turmor on back   . Appendectomy    . Cholecystectomy    . Leg surgery Right     "clot and had drain in leg  rt lower leg   HPI:  65 yo F admitted after episode of slurred speech, and R facial droop. Head CT (-) for acute changes, CXR (+) PNA, MRI (+) subcentimeter infarct in the subcortical posterior L frontal lobe. 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    Assessment / Plan / Recommendation Clinical Impression  Pt presents with mild cognitive communication changes characterized by decreased working memory, intermittent confusion, decreased awareness of deficits, and impulsivity negatively impacted independence with safety at home. Recommend 24 hour supervision for now and home health SLP to follow for mild cognitive changes.     SLP Assessment  All further Speech Lanaguage Pathology  needs can be addressed in the next venue of care    Follow Up Recommendations  Home health SLP;Outpatient SLP    Frequency and Duration           SLP Evaluation Prior Functioning  Cognitive/Linguistic Baseline: Within functional limits Type of Home: House  Lives With: Son Available Help at Discharge: Family Education: GED, some college Vocation: Retired   IT consultant  Overall Cognitive Status: Impaired/Different from baseline Arousal/Alertness: Awake/alert Orientation Level: Oriented X4 Memory: Impaired Memory Impairment: Retrieval deficit Awareness: Impaired Awareness Impairment: Emergent impairment Problem Solving: Appears intact Executive Function: Self Monitoring Self Monitoring: Impaired Self Monitoring Impairment: Verbal complex;Functional complex Behaviors: Restless;Impulsive Safety/Judgment: Impaired Comments: decreased appreciation for deficit    Comprehension  Auditory Comprehension Overall Auditory Comprehension: Appears within functional limits for tasks assessed Yes/No Questions: Within Functional Limits Commands: Within Functional Limits Conversation: Complex Visual Recognition/Discrimination Discrimination: Within Function Limits Reading Comprehension Reading Status: Not tested    Expression Expression Primary Mode of Expression: Verbal Verbal Expression Overall Verbal Expression: Appears within functional limits for tasks  assessed Initiation: No  impairment Level of Generative/Spontaneous Verbalization: Conversation Repetition: No impairment Naming: No impairment Pragmatics: No impairment Non-Verbal Means of Communication: Not applicable Written Expression Dominant Hand: Right Written Expression: Not tested   Oral / Motor  Oral Motor/Sensory Function Overall Oral Motor/Sensory Function: Within functional limits Motor Speech Overall Motor Speech: Appears within functional limits for tasks assessed Respiration: Impaired (SOB has PNA) Level of Impairment: Sentence Phonation: Normal Resonance: Within functional limits Articulation: Within functional limitis Intelligibility: Intelligible Motor Planning: Witnin functional limits Motor Speech Errors: Not applicable   GO          Functional Assessment Tool Used: clinical judgment Functional Limitations: Memory Memory Current Status ([id number](G9168): At least 20 percent but less than 40 percent impaired, limited or restricted Memory Goal Status (U0454(G9169): At least 1 percent but less than 20 percent impaired, limited or restricted Memory Discharge Status [phone](G9170): At least 20 percent but less than 40 percent impaired, limited or restricted         Thank you,  Havery MorosDabney Porter, CCC-SLP [phone]  PORTER,DABNEY 08/02/2015, 1:41 PM

## 2015-08-02 NOTE — Progress Notes (Signed)
OT Cancellation Note  Patient Details Name: Maryann AlarVerna Dianne Kropf MRN: [id number] DOB: 08/27/1950   Cancelled Treatment:    Reason Eval/Treat Not Completed: Patient at procedure or test/ unavailable  Limmie PatriciaLaura Joene Gelder, OTR/L,CBIS  [phone]  08/02/2015, 8:48 AM

## 2015-08-02 NOTE — Progress Notes (Signed)
Nitro SL given x3 without relief from pain. MD paged and new orders given.

## 2015-08-02 NOTE — Progress Notes (Signed)
Pt c/o of chest pain rated at an 8 worse when taking a deep breath. EKG obtained. MD paged and orders received.

## 2015-08-02 NOTE — Progress Notes (Signed)
PROGRESS NOTE    Laura Dalton  ZOX:[id number] DOB: 12-14-1950 DOA: 08/01/2015 PCP: Elige Radon Dettinger, MD     Brief Narrative:  65 year old woman admitted on 5/21with confusion and slurred speech. MRI has shown a recent posterior left frontal lobe CVA. She also developed a temperature of 103 on the night of 5/21, CT scan was requested which showed findings significant for pneumonia and has been started on community-acquired coverage.   Assessment & Plan:   Principal Problem:   TIA (transient ischemic attack) Active Problems:   Chronic back pain   HTN (hypertension)   DM (diabetes mellitus) (HCC)   Diabetic peripheral neuropathy associated with type 2 diabetes mellitus (HCC)   Hypothyroidism   CAP (community acquired pneumonia)   Acute CVA -2-D echo, carotid Dopplers requested. -Off Plavix for secondary stroke prevention given anaphylaxis type reaction to aspirin. -PT has seen with recommendations for home health therapy.  Community-acquired pneumonia -Continue Levaquin, would like to see at least 24 hours without fever prior to discharge home. -Has no oxygen requirements at present.  Hy    Hypothyroidism -Continue Synthroid  Hypertension -Well-controlled  Diabetes mellitus -Fair control, continue current regimen   DVT prophylaxis: Lovenox Code Status: Full code Family Communication: Daughter at bedside updated on plan of care and all questions answered Disposition Plan: Home in 24-48 hours  Consultants:   None  Procedures:   None  Antimicrobials:   Levaquin    Subjective: Feels well, wants to go home, had a temp of 103 last night  Objective: Filed Vitals:   08/02/15 0252 08/02/15 0652 08/02/15 1052 08/02/15 1435  BP: 129/59 136/58 136/67 135/57  Pulse: 96 101 106 102  Temp: 98.6 F (37 C) 99.7 F (37.6 C) 101.2 F (38.4 C) 100.1 F (37.8 C)  TempSrc:  Oral Oral Oral  Resp: 20 20 20 20   Height:      Weight:      SpO2: 97% 94% 94% 99%     Intake/Output Summary (Last 24 hours) at 08/02/15 1552 Last data filed at 08/02/15 1300  Gross per 24 hour  Intake   1110 ml  Output      0 ml  Net   1110 ml   Filed Weights   08/01/15 1652  Weight: 88.996 kg (196 lb 3.2 oz)    Examination:   General exam: Alert, awake, oriented x 3 Respiratory system: Clear to auscultation. Respiratory effort normal. Cardiovascular system:RRR. No murmurs, rubs, gallops. Gastrointestinal system: Abdomen is nondistended, soft and nontender. No organomegaly or masses felt. Normal bowel sounds heard. Central nervous system: Alert and oriented. No focal neurological deficits. Extremities: No C/C/E, +pedal pulses Skin: No rashes, lesions or ulcers Psychiatry: Judgement and insight appear normal. Mood & affect appropriate.     Data Reviewed: I have personally reviewed following labs and imaging studies  CBC:  Recent Labs Lab 08/01/15 1220 08/01/15 2223  WBC 18.0* 16.6*  NEUTROABS 14.0*  --   HGB 12.8 12.3  HCT 39.2 37.9  MCV 85.0 85.4  PLT 267 247   Basic Metabolic Panel:  Recent Labs Lab 08/01/15 1220 08/01/15 2223 08/01/15 2231  NA 134* 135  --   K 3.2* 2.7*  --   CL 95* 95*  --   CO2 28 31  --   GLUCOSE 126* 121*  --   BUN 13 10  --   CREATININE 0.86 0.92  --   CALCIUM 9.9 9.6  --   MG  --   --  1.8   GFR: Estimated Creatinine Clearance: 68.5 mL/min (by C-G formula based on Cr of 0.92). Liver Function Tests: No results for input(s): AST, ALT, ALKPHOS, BILITOT, PROT, ALBUMIN in the last 168 hours. No results for input(s): LIPASE, AMYLASE in the last 168 hours. No results for input(s): AMMONIA in the last 168 hours. Coagulation Profile: No results for input(s): INR, PROTIME in the last 168 hours. Cardiac Enzymes:  Recent Labs Lab 08/01/15 2223 08/02/15 0455 08/02/15 1036  TROPONINI <0.03 <0.03 <0.03   BNP (last 3 results) No results for input(s): PROBNP in the last 8760 hours. HbA1C: No results for  input(s): HGBA1C in the last 72 hours. CBG:  Recent Labs Lab 08/01/15 1202 08/01/15 2057 08/02/15 0745 08/02/15 1117  GLUCAP 137* 143* 131* 160*   Lipid Profile:  Recent Labs  08/02/15 0455  CHOL 107  HDL 42  LDLCALC 50  TRIG 74  CHOLHDL 2.5   Thyroid Function Tests: No results for input(s): TSH, T4TOTAL, FREET4, T3FREE, THYROIDAB in the last 72 hours. Anemia Panel: No results for input(s): VITAMINB12, FOLATE, FERRITIN, TIBC, IRON, RETICCTPCT in the last 72 hours. Urine analysis:    Component Value Date/Time   COLORURINE YELLOW 08/01/2015 1225   APPEARANCEUR CLEAR 08/01/2015 1225   APPEARANCEUR Clear 07/02/2015 0837   LABSPEC 1.010 08/01/2015 1225   PHURINE 7.0 08/01/2015 1225   GLUCOSEU NEGATIVE 08/01/2015 1225   HGBUR NEGATIVE 08/01/2015 1225   BILIRUBINUR NEGATIVE 08/01/2015 1225   BILIRUBINUR Negative 07/02/2015 0837   BILIRUBINUR neg 04/30/2015 1313   KETONESUR NEGATIVE 08/01/2015 1225   PROTEINUR NEGATIVE 08/01/2015 1225   PROTEINUR Negative 07/02/2015 0837   PROTEINUR neg 04/30/2015 1313   UROBILINOGEN negative 04/30/2015 1313   NITRITE NEGATIVE 08/01/2015 1225   NITRITE Negative 07/02/2015 0837   NITRITE neg 04/30/2015 1313   LEUKOCYTESUR TRACE* 08/01/2015 1225   LEUKOCYTESUR 1+* 07/02/2015 0837   Sepsis Labs: @LABRCNTIP (procalcitonin:4,lacticidven:4)  ) Recent Results (from the past 240 hour(s))  Culture, blood (routine x 2)     Status: None (Preliminary result)   Collection Time: 08/01/15 10:23 PM  Result Value Ref Range Status   Specimen Description LEFT ANTECUBITAL  Final   Special Requests BOTTLES DRAWN AEROBIC ONLY 4CC  Final   Culture NO GROWTH < 12 HOURS  Final   Report Status PENDING  Incomplete  Culture, blood (routine x 2)     Status: None (Preliminary result)   Collection Time: 08/01/15 10:27 PM  Result Value Ref Range Status   Specimen Description BLOOD LEFT HAND  Final   Special Requests BOTTLES DRAWN AEROBIC ONLY 4CC  Final    Culture NO GROWTH < 12 HOURS  Final   Report Status PENDING  Incomplete         Radiology Studies: Dg Chest 2 View  08/01/2015  CLINICAL DATA:  Weakness over the last several days. EXAM: CHEST  2 VIEW COMPARISON:  One-view chest x-ray 04/13/2014.  CTA chest 04/14/2015. FINDINGS: The heart is mildly enlarged. Atherosclerotic changes are present at the aortic arch. There is no significant interval change. Emphysematous changes are noted. No focal airspace disease present. There is no edema or effusion to suggest failure. IMPRESSION: 1. No acute cardiopulmonary disease or significant oval change. 2. Emphysema. 3. Stable mild cardiomegaly without failure. 4. Atherosclerosis. Electronically Signed   By: Marin Roberts M.D.   On: 08/01/2015 14:04   Ct Head Wo Contrast  08/01/2015  CLINICAL DATA:  Weakness. EXAM: CT HEAD WITHOUT CONTRAST TECHNIQUE: Contiguous axial images were  obtained from the base of the skull through the vertex without intravenous contrast. COMPARISON:  None. FINDINGS: No evidence of parenchymal hemorrhage or extra-axial fluid collection. No mass lesion, mass effect, or midline shift. No CT evidence of acute infarction. Intracranial atherosclerosis. Nonspecific mild subcortical and periventricular white matter hypodensity, most in keeping with chronic small vessel ischemic change. Cerebral volume is age appropriate. No ventriculomegaly. The visualized paranasal sinuses are essentially clear. The mastoid air cells are unopacified. No evidence of calvarial fracture. IMPRESSION: 1.  No evidence of acute intracranial abnormality. 2. Mild chronic small vessel ischemia. Electronically Signed   By: Delbert Phenix M.D.   On: 08/01/2015 14:14   Ct Angio Chest Pe W/cm &/or Wo Cm  08/02/2015  CLINICAL DATA:  Midsternal chest pain.  Elevated D-dimer. EXAM: CT ANGIOGRAPHY CHEST WITH CONTRAST TECHNIQUE: Multidetector CT imaging of the chest was performed using the standard protocol during bolus  administration of intravenous contrast. Multiplanar CT image reconstructions and MIPs were obtained to evaluate the vascular anatomy. CONTRAST:  100 mL Isovue 370 IV COMPARISON:  Radiographs yesterday.  Chest CT 04/14/2015 FINDINGS: Motion artifact through the lung bases limiting assessment, there are otherwise no filling defects in the pulmonary arteries to suggest pulmonary embolus. Normal caliber thoracic aorta without dissection, moderate atherosclerosis. Small anterior mediastinal lymph nodes, largest measures 7 mm short axis. No hilar adenopathy. No pericardial effusion. Rounded lingular opacity measures 3.5 x 2.0 cm, with adjacent ground-glass and patchy opacities in the anterior left upper lobe. Probable air bronchograms peripherally. This is at site of atelectasis on prior exam. Additional linear atelectasis elsewhere within both lungs. Linear opacities at the lung apices, right greater than left may be scarring. Tiny reticular nodular opacities in the subpleural right upper lobe image 75 series 6, nonspecific. Mild central bronchial thickening appears chronic. No pleural effusion. Patulous esophagus. No acute abnormality in the included upper abdomen. There are no acute or suspicious osseous abnormalities. Degenerative change in the lower thoracic spine. Review of the MIP images confirms the above findings. IMPRESSION: 1. No pulmonary embolus allowing for motion artifact in the lower lobes. 2. Rounded lingular opacity measuring 3.5 cm with adjacent ground-glass and patchy opacities. Favor infectious etiology such as pneumonia, as atelectasis was seen in this region on exam 3 months prior. Followup chest CT (this region not well visualized on radiograph) is recommended in 3-4 weeks following trial of antibiotic therapy to ensure resolution and exclude underlying malignancy. 3. Tiny nodular opacities in the subpleural right upper lobe are nonspecific, attention to this at follow-up recommended.  Electronically Signed   By: Rubye Oaks M.D.   On: 08/02/2015 03:45   Mr Maxine Glenn Head Wo Contrast  08/02/2015  CLINICAL DATA:  Slurred speech and weakness for 2 days. EXAM: MRI HEAD WITHOUT CONTRAST MRA HEAD WITHOUT CONTRAST TECHNIQUE: Multiplanar, multiecho pulse sequences of the brain and surrounding structures were obtained without intravenous contrast. Angiographic images of the head were obtained using MRA technique without contrast. COMPARISON:  Head CT 08/01/2015 FINDINGS: MRI HEAD FINDINGS Calvarium and upper cervical spine: No focal marrow signal abnormality. Orbits: Mild staphyloma. Sinuses and Mastoids: Clear. Brain: There is a chronic lacune within the left posterior frontal subcortical white matter with fluid intensity and peripheral gliotic ram. Cranial to this level is 7 mm diffusion hyperintensity without matching shine through on ADC map, compatible with recent infarct (acute or subacute). There is no superimposed hemorrhage. Patchy FLAIR hyperintensity in the bilateral cerebral white matter with thin rim of around the lateral  ventricles, attributed to chronic small vessel disease given patient's multiple vascular risk factors. Cerebral volume is normal. No evidence of major vessel occlusion. MRA HEAD FINDINGS Symmetric carotid and vertebral arteries. Right PICA is not seen, but large right AICA. Otherwise standard vertebrobasilar branching. An anterior communicating artery is small if present. Bilateral posterior communicating arteries. Major vessels are smooth and patent. No major branch occlusion. Negative for aneurysm. Flow is seen in the straight sinus, attributed to direction of a superior vermian vein rather than fistula. IMPRESSION: 1. Recent subcentimeter infarct in the subcortical posterior left frontal lobe. 2. Elsewhere mild white matter disease attributed to chronic small vessel ischemia. 3. Negative intracranial MRA. Electronically Signed   By: Marnee SpringJonathon  Watts M.D.   On: 08/02/2015  09:06   Mr Brain Wo Contrast  08/02/2015  CLINICAL DATA:  Slurred speech and weakness for 2 days. EXAM: MRI HEAD WITHOUT CONTRAST MRA HEAD WITHOUT CONTRAST TECHNIQUE: Multiplanar, multiecho pulse sequences of the brain and surrounding structures were obtained without intravenous contrast. Angiographic images of the head were obtained using MRA technique without contrast. COMPARISON:  Head CT 08/01/2015 FINDINGS: MRI HEAD FINDINGS Calvarium and upper cervical spine: No focal marrow signal abnormality. Orbits: Mild staphyloma. Sinuses and Mastoids: Clear. Brain: There is a chronic lacune within the left posterior frontal subcortical white matter with fluid intensity and peripheral gliotic ram. Cranial to this level is 7 mm diffusion hyperintensity without matching shine through on ADC map, compatible with recent infarct (acute or subacute). There is no superimposed hemorrhage. Patchy FLAIR hyperintensity in the bilateral cerebral white matter with thin rim of around the lateral ventricles, attributed to chronic small vessel disease given patient's multiple vascular risk factors. Cerebral volume is normal. No evidence of major vessel occlusion. MRA HEAD FINDINGS Symmetric carotid and vertebral arteries. Right PICA is not seen, but large right AICA. Otherwise standard vertebrobasilar branching. An anterior communicating artery is small if present. Bilateral posterior communicating arteries. Major vessels are smooth and patent. No major branch occlusion. Negative for aneurysm. Flow is seen in the straight sinus, attributed to direction of a superior vermian vein rather than fistula. IMPRESSION: 1. Recent subcentimeter infarct in the subcortical posterior left frontal lobe. 2. Elsewhere mild white matter disease attributed to chronic small vessel ischemia. 3. Negative intracranial MRA. Electronically Signed   By: Marnee SpringJonathon  Watts M.D.   On: 08/02/2015 09:06   [place] Carotid Bilateral  08/02/2015  CLINICAL DATA:  TIA:  Confusion and slurred speech. Hypertension, hyperlipidemia, diabetes. EXAM: BILATERAL CAROTID DUPLEX ULTRASOUND TECHNIQUE: Wallace CullensGray scale imaging, color Doppler and duplex ultrasound was performed of bilateral carotid and vertebral arteries in the neck. COMPARISON:  None. TECHNIQUE: Quantification of carotid stenosis is based on velocity parameters that correlate the residual internal carotid diameter with NASCET-based stenosis levels, using the diameter of the distal internal carotid lumen as the denominator for stenosis measurement. The following velocity measurements were obtained: PEAK SYSTOLIC/END DIASTOLIC RIGHT ICA:                     144/43cm/sec CCA:                     112/22cm/sec SYSTOLIC ICA/CCA RATIO:  1.3 DIASTOLIC ICA/CCA RATIO: 1.9 ECA:                     162cm/sec LEFT ICA:                     123/21cm/sec CCA:  143/34cm/sec SYSTOLIC ICA/CCA RATIO:  0.9 DIASTOLIC ICA/CCA RATIO: 0.6 ECA:                     154cm/sec FINDINGS: RIGHT CAROTID ARTERY: Eccentric calcified plaque in the carotid bulb extending to the proximal internal and external carotid arteries resulting in only mild stenosis. Normal waveforms and color Doppler signal. RIGHT VERTEBRAL ARTERY:  Normal flow direction and waveform. LEFT CAROTID ARTERY: Eccentric calcified plaque in the distal common carotid artery and bulb extending into proximal internal and external carotid arteries without high-grade stenosis. Normal waveforms and color Doppler signal. LEFT VERTEBRAL ARTERY: Normal flow direction and waveform. IMPRESSION: 1. Bilateral carotid bifurcation and proximal ICA plaque, resulting in less than 50% diameter stenosis. The exam does not exclude plaque ulceration or embolization. Continued surveillance recommended. 2.  Antegrade bilateral vertebral arterial flow. Electronically Signed   By: Corlis Leak M.D.   On: 08/02/2015 10:16   Dg Chest Port 1 View  08/02/2015  CLINICAL DATA:  Acute onset of fever. Inability  to walk. Initial encounter. EXAM: PORTABLE CHEST 1 VIEW COMPARISON:  Chest radiograph performed earlier today at 12:43 p.m. FINDINGS: The lungs are well-aerated. Mild vascular congestion is noted. There is no evidence of focal opacification, pleural effusion or pneumothorax. The cardiomediastinal silhouette is borderline normal in size. No acute osseous abnormalities are seen. IMPRESSION: Mild vascular congestion noted.  Lungs otherwise grossly clear. Electronically Signed   By: Roanna Raider M.D.   On: 08/02/2015 02:21        Scheduled Meds: .  stroke: mapping our early stages of recovery book   Does not apply Once  . amitriptyline  100 mg Oral QHS  . amLODipine  2.5 mg Oral Daily  . atorvastatin  40 mg Oral Daily  . clopidogrel  75 mg Oral Daily  . enoxaparin (LOVENOX) injection  40 mg Subcutaneous Q24H  . fluticasone furoate-vilanterol  1 puff Inhalation Daily  . gabapentin  600 mg Oral TID  . losartan  100 mg Oral Daily   And  . hydrochlorothiazide  25 mg Oral Daily  . insulin aspart  0-15 Units Subcutaneous TID WC  . insulin aspart  0-5 Units Subcutaneous QHS  . insulin aspart  4 Units Subcutaneous TID WC  . [START ON 08/03/2015] levofloxacin (LEVAQUIN) IV  750 mg Intravenous Q24H  . levothyroxine  75 mcg Oral QAC breakfast   Continuous Infusions:       Time spent: 25 minutes. Greater than 50% of this time was spent in direct contact with the patient coordinating care.     Chaya Jan, MD [facility] Pager [phone]  If 7PM-7AM, please contact night-coverage [email or web address] Password [email or web address] 08/02/2015, 3:52 PM

## 2015-08-02 NOTE — Care Management Obs Status (Signed)
MEDICARE OBSERVATION STATUS NOTIFICATION   Patient Details  Name: Laura Dalton MRN: [id number] Date of Birth: 09/08/1950   Medicare Observation Status Notification Given:  Yes    Malcolm MetroChildress, Amin Fornwalt Demske, RN 08/02/2015, 2:52 PM

## 2015-08-02 NOTE — Progress Notes (Signed)
ANTIBIOTIC CONSULT NOTE-Preliminary  Pharmacy Consult for Levofloxacin Indication: Pneumonia  Allergies  Allergen Reactions  . Aspirin Hives    Patient Measurements: Height: 5\' 6"  (167.6 cm) Weight: 196 lb 3.2 oz (88.996 kg) IBW/kg (Calculated) : 59.3   Vital Signs: Temp: 98.6 F (37 C) (05/22 0252) Temp Source: Oral (05/22 0052) BP: 129/59 mmHg (05/22 0252) Pulse Rate: 96 (05/22 0252)  Labs:  Recent Labs  08/01/15 1220 08/01/15 2223  WBC 18.0* 16.6*  HGB 12.8 12.3  PLT 267 247  CREATININE 0.86 0.92    Estimated Creatinine Clearance: 68.5 mL/min (by C-G formula based on Cr of 0.92).  No results for input(s): VANCOTROUGH, VANCOPEAK, VANCORANDOM, GENTTROUGH, GENTPEAK, GENTRANDOM, TOBRATROUGH, TOBRAPEAK, TOBRARND, AMIKACINPEAK, AMIKACINTROU, AMIKACIN in the last 72 hours.   Microbiology: Recent Results (from the past 720 hour(s))  Culture, blood (routine x 2)     Status: None (Preliminary result)   Collection Time: 08/01/15 10:23 PM  Result Value Ref Range Status   Specimen Description LEFT ANTECUBITAL  Final   Special Requests BOTTLES DRAWN AEROBIC ONLY 4CC  Final   Culture PENDING  Incomplete   Report Status PENDING  Incomplete  Culture, blood (routine x 2)     Status: None (Preliminary result)   Collection Time: 08/01/15 10:27 PM  Result Value Ref Range Status   Specimen Description BLOOD LEFT HAND  Final   Special Requests BOTTLES DRAWN AEROBIC ONLY 4CC  Final   Culture PENDING  Incomplete   Report Status PENDING  Incomplete    Medical History: Past Medical History  Diagnosis Date  . Bursitis of hip   . Low back pain     benign turmor back  . COPD (chronic obstructive pulmonary disease) (HCC)   . Hypothyroidism   . Vitamin D deficiency disease   . Hypertension   . Diabetes mellitus without complication (HCC)     type II    Medications:   Assessment: 65 yo female with hx of COPD presented to the ED with s/s TIAs: recent difficulty walking,  confusion and reported facial droop. Pt also reports SOB and chest pain. CT was performed to r/o PE with results suggesting possible pneumonia. WBCs are elevated. Empiric antibiotics to be given for CAP.   Goal of Therapy: Eradicate infection  Plan:  Preliminary review of pertinent patient information completed.  Protocol will be initiated with a one-time dose of levofloxacin 750 mg IV.  Jeani HawkingAnnie Penn clinical pharmacist will complete review during morning rounds to assess patient and finalize treatment regimen.  Arelia SneddonMason, Brenleigh Collet Anne, Tyresa Prindiville [facility]PH 08/02/2015,4:31 AM

## 2015-08-02 NOTE — Progress Notes (Signed)
Pharmacy Antibiotic Note  Maryann AlarVerna Dianne Clevenger is a 65 y.o. female admitted on 08/01/2015 with pneumonia.  Pharmacy has been consulted for Levaquin dosing.  Plan: Levaquin 750mg  IV every 24 hours F/U cultures, monitor labs Transition to po as indicated  Height: 5\' 6"  (167.6 cm) Weight: 196 lb 3.2 oz (88.996 kg) IBW/kg (Calculated) : 59.3  Temp (24hrs), Avg:100.1 F (37.8 C), Min:98.6 F (37 C), Max:103.2 F (39.6 C)   Recent Labs Lab 08/01/15 1220 08/01/15 2223 08/02/15 0104  WBC 18.0* 16.6*  --   CREATININE 0.86 0.92  --   LATICACIDVEN  --  1.3 1.5    Estimated Creatinine Clearance: 68.5 mL/min (by C-G formula based on Cr of 0.92).    Allergies  Allergen Reactions  . Aspirin Hives    Antimicrobials this admission: levaquin 5/22 >>  Microbiology results: 5/21 BCx: pending  Thank you for allowing pharmacy to be a part of this patient's care. Elder CyphersLorie Trenna Kiely, BS Pharm D, [place] Clinical Pharmacist Pager [phone] 08/02/2015 7:41 AM

## 2015-08-03 DIAGNOSIS — I639 Cerebral infarction, unspecified: Principal | ICD-10-CM

## 2015-08-03 LAB — GLUCOSE, CAPILLARY
Glucose-Capillary: 107 mg/dL — ABNORMAL HIGH (ref 65–99)
Glucose-Capillary: 123 mg/dL — ABNORMAL HIGH (ref 65–99)

## 2015-08-03 LAB — HEMOGLOBIN A1C
HEMOGLOBIN A1C: 6.5 % — AB (ref 4.8–5.6)
Hgb A1c MFr Bld: 6.4 % — ABNORMAL HIGH (ref 4.8–5.6)
MEAN PLASMA GLUCOSE: 137 mg/dL
Mean Plasma Glucose: 140 mg/dL

## 2015-08-03 MED ORDER — LEVOFLOXACIN 750 MG PO TABS: 750.0000 mg | ORAL_TABLET | Freq: Every day | ORAL | Status: DC

## 2015-08-03 MED ORDER — CLOPIDOGREL BISULFATE 75 MG PO TABS: 75.0000 mg | ORAL_TABLET | Freq: Every day | ORAL | Status: DC

## 2015-08-03 NOTE — Care Management Important Message (Signed)
Important Message  Patient Details  Name: Laura Dalton MRN: [id number] Date of Birth: 03/04/1951   Medicare Important Message Given:  Yes    Adonis HugueninBerkhead, Kimberley Dastrup L, RN 08/03/2015, 12:04 PM

## 2015-08-03 NOTE — Care Management Note (Signed)
Case Management Note  Patient Details  Name: Maryann AlarVerna Dianne Iversen MRN: [id number] Date of Birth: 03/07/1951  Subjective/Objective:    Notified [facility] of patient discharge today. Spoke with patient who voiced no new concerns. IM Given.                Action/Plan: Home with Home Health.   Expected Discharge Date:  08/04/15               Expected Discharge Plan:  Home w Home Health Services  In-House Referral:  NA  Discharge planning Services  CM Consult  Post Acute Care Choice:  Home Health, Resumption of Svcs/PTA Provider Choice offered to:  Patient  DME Arranged:    DME Agency:     HH Arranged:  PT, RN HH Agency:  [facility]  Status of Service:  Completed, signed off  Medicare Important Message Given:  Yes Date Medicare IM Given:    Medicare IM give by:    Date Additional Medicare IM Given:    Additional Medicare Important Message give by:     If discussed at Long Length of Stay Meetings, dates discussed:    Additional Comments:  Adonis HugueninBerkhead, Marcea Rojek L, RN 08/03/2015, 12:04 PM

## 2015-08-03 NOTE — Consult Note (Signed)
   [facility]   08/03/2015  Laura AlarVerna Dianne Dalton 04/06/1950 [id number]  Spoke with patient and her son, at bedside regarding [facility] services. Patient does not want wish to participate with [facility] program services at this time, states she has supportive family and also supportive church family. Patient given [facility] broch[facility] and contact information for future reference.  Of note, [facility] Care [facility] services would not replace or interfere with any services that are arranged by inpatient case management or social work. For additional questions or referrals please contact:  Alben SpittleMary E. Albertha GheeNiemczura, RN, BSN, [facility]  [facility] Hospital Liaison [phone]

## 2015-08-03 NOTE — Evaluation (Signed)
Occupational Therapy Evaluation Patient Details Name: Laura Dalton MRN: [id number] DOB: 02-22-1951 Today's Date: 08/03/2015    History of Present Illness 65 yo F admitted after episode of slurred speech, and R facial droop.  Head CT (-) for acute changes, CXR (+) PNA, MRI (+) subcentimeter infarct in the subcortical posterior L frontal lobe.  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    Clinical Impression   Pt awake, alert, oriented x4 this am, agreeable to OT evaluation. Pt reports feeling much improved this am, reports RLE is feeling much stronger and "back to normal."  Pt demonstrates modified independence during ADL tasks, supervision for functional mobility, declined to use walker. RUE coordination intact this am, able to complete thumb to fingertip for all digits with no difficulty, also able to use RUE as dominant when manipulating utensils and completing all ADL tasks. RUE strength WFL at 4+/5. Pt appears to be at baseline with ADL tasks, no further OT services required at this time.     Follow Up Recommendations  No OT follow up    Equipment Recommendations  None recommended by OT       Precautions / Restrictions Precautions Precautions: Fall Restrictions Weight Bearing Restrictions: No      Mobility Bed Mobility Overal bed mobility: Modified Independent                Transfers Overall transfer level: Modified independent                         ADL Overall ADL's : Needs assistance/impaired Eating/Feeding: Modified independent   Grooming: Wash/dry hands;Modified independent;Standing                   Toilet Transfer: Modified Tour manager and Hygiene: Modified independent;Sit to/from stand        Functional mobility during ADLs: Supervision/safety       Vision Vision Assessment?: No apparent visual deficits          Pertinent Vitals/Pain Pain Assessment: No/denies pain     Hand Dominance Right   Extremity/Trunk Assessment Upper Extremity Assessment Upper Extremity Assessment: Overall WFL for tasks assessed   Lower Extremity Assessment Lower Extremity Assessment: Defer to PT evaluation       Communication Communication Communication: No difficulties   Cognition Arousal/Alertness: Awake/alert Behavior During Therapy: WFL for tasks assessed/performed Overall Cognitive Status: Within Functional Limits for tasks assessed                                Home Living Family/patient expects to be discharged to:: Private residence Living Arrangements: Children (son) Available Help at Discharge: Family Type of Home: House             Bathroom Shower/Tub: Walk-in Soil scientist Toilet: Standard     Home Equipment: Environmental consultant - 4 wheels;Cane - single point;Bedside commode;Wheelchair - manual      Lives With: Son    Prior Functioning/Environment Level of Independence: Independent with assistive device(s)  Gait / Transfers Assistance Needed: Pt states that she usually uses a cane except for bad days, when she uses a rollator.   ADL's / Homemaking Assistance Needed: Pt reports independence in ADL tasks, housekeeping, meal preparation, and  driving Communication / Swallowing Assistance Needed: none       End of Session    Activity Tolerance: Patient tolerated treatment well Patient left: in bed;with call bell/phone within reach;with bed alarm set   Time: [id number] OT Time Calculation (min): 16 min Charges:  OT General Charges $OT Visit: 1 Procedure OT Evaluation $OT Eval Low Complexity: 1 Procedure  Ezra SitesLeslie Troxler, OTR/L  [phone]  08/03/2015, 8:38 AM

## 2015-08-03 NOTE — Discharge Summary (Signed)
Physician Discharge Summary  Laura Dalton ZOX:[id number] DOB: 1950-04-23 DOA: 08/01/2015  PCP: Elige Radon Dettinger, MD  Admit date: 08/01/2015 Discharge date: 08/03/2015  Time spent: 45 minutes  Recommendations for Outpatient Follow-up:  -Will be discharged home today. -Advised to follow-up with primary care provider 2 weeks.   Discharge Diagnoses:  Principal Problem:   TIA (transient ischemic attack) Active Problems:   Chronic back pain   HTN (hypertension)   DM (diabetes mellitus) (HCC)   Diabetic peripheral neuropathy associated with type 2 diabetes mellitus (HCC)   Hypothyroidism   CAP (community acquired pneumonia)   Acute CVA (cerebrovascular accident) [facility])   Discharge Condition: Stable and improved  Filed Weights   08/01/15 1652 08/03/15 0647  Weight: 88.996 kg (196 lb 3.2 oz) 87.544 kg (193 lb)    History of present illness:  Laura Dalton is a 65 y.o. female with history of hypertension, type 2 diabetes, hypothyroidism and COPD who presents to the hospital with the above complaints. Patient is not really clear on exactly what happened. She states she awoke in her normal state of health and went around her regular routine and she states that her son "overreacted" and brought her to the hospital for evaluation. Patient's son who was present at time of my exam, tells me that his brother called him this morning and told him that his mother seemed strange, was confused, asking about people who had died a while back, noticed that she had slurred speech and her mouth was drooping on the left side. The son who was present at time of my exam arrived to the house about 45 minutes later and noticed that his mother seemed to be close to her normal self but decided to bring her into the hospital for evaluation to be on the safe side. In the emergency department her vital signs are within normal limits, labs are normal with the exception of a potassium of 3.2 and a WBC count of  18.0, urine analysis is negative for UTI, chest x-ray without acute disease, CT scan of the head without acute abnormality, were asked to admit her for further evaluation and management.  Hospital Course:   Acute CVA -2-D echo, carotid Dopplers requested and results as below. -On Plavix for secondary stroke prevention given anaphylaxis type reaction to aspirin. -PT has seen with recommendations for home health therapy.  Community-acquired pneumonia -Continue Levaquin for 7 more days. -Has minimal oxygen requirements, has been afebrile for 24 hours, is feeling well and anxious to be discharged home today.  Hypothyroidism -Continue Synthroid  Hypertension -Well-controlled  Diabetes mellitus -Fair control, continue current regimen  Procedures: ECHO: Impressions:  - Upper normal LV wall thickness with LVEF 60-65%. Grossly normal  diastolic function. MAC with trivial mitral regurgitation. Mildly  sclerotic aortic valve. Trivial tricuspid regurgitation. No   obvious PFO or ASD. Dopplers: IMPRESSION: 1. Bilateral carotid bifurcation and proximal ICA plaque, resulting in less than 50% diameter stenosis. The exam does not exclude plaque ulceration or embolization. Continued surveillance recommended.  2. Antegrade bilateral vertebral arterial flow.    Consultations:  None  Discharge Instructions  Discharge Instructions    Diet - low sodium heart healthy    Complete by:  As directed      Increase activity slowly    Complete by:  As directed             Medication List    STOP taking these medications  ADVAIR DISKUS 250-50 MCG/DOSE Aepb  Generic drug:  Fluticasone-Salmeterol     cefdinir 300 MG capsule  Commonly known as:  OMNICEF     methocarbamol 500 MG tablet  Commonly known as:  ROBAXIN     traMADol 50 MG tablet  Commonly known as:  ULTRAM      TAKE these medications        amitriptyline 100 MG tablet  Commonly known as:  ELAVIL  Take 1  tablet (100 mg total) by mouth at bedtime.     amLODipine 2.5 MG tablet  Commonly known as:  NORVASC  TAKE ONE TABLET BY MOUTH ONCE DAILY     atorvastatin 40 MG tablet  Commonly known as:  LIPITOR  Take 1 tablet (40 mg total) by mouth daily.     clopidogrel 75 MG tablet  Commonly known as:  PLAVIX  Take 1 tablet (75 mg total) by mouth daily.     fluticasone furoate-vilanterol 100-25 MCG/INH Aepb  Commonly known as:  BREO ELLIPTA  Inhale 1 puff into the lungs daily.     gabapentin 600 MG tablet  Commonly known as:  NEURONTIN  TAKE ONE TABLET BY MOUTH THREE TIMES DAILY     levofloxacin 750 MG tablet  Commonly known as:  LEVAQUIN  Take 1 tablet (750 mg total) by mouth daily.     levothyroxine 75 MCG tablet  Commonly known as:  SYNTHROID  Take 1 tablet (75 mcg total) by mouth daily before breakfast.     losartan-hydrochlorothiazide 100-25 MG tablet  Commonly known as:  HYZAAR  Take 1 tablet by mouth daily.     metFORMIN 500 MG tablet  Commonly known as:  GLUCOPHAGE  TAKE ONE TABLET BY MOUTh once DAILY       Allergies  Allergen Reactions  . Aspirin Hives       Follow-up Information    Follow up with Nils Pyle, MD. Schedule an appointment as soon as possible for a visit in 2 weeks.   Specialties:  Family Medicine, Cardiology   Contact information:   [address] [place] [address] [phone]        The results of significant diagnostics from this hospitalization (including imaging, microbiology, ancillary and laboratory) are listed below for reference.    Significant Diagnostic Studies: Dg Chest 2 View  08/01/2015  CLINICAL DATA:  Weakness over the last several days. EXAM: CHEST  2 VIEW COMPARISON:  One-view chest x-ray 04/13/2014.  CTA chest 04/14/2015. FINDINGS: The heart is mildly enlarged. Atherosclerotic changes are present at the aortic arch. There is no significant interval change. Emphysematous changes are noted. No focal airspace disease  present. There is no edema or effusion to suggest failure. IMPRESSION: 1. No acute cardiopulmonary disease or significant oval change. 2. Emphysema. 3. Stable mild cardiomegaly without failure. 4. Atherosclerosis. Electronically Signed   By: Marin Roberts M.D.   On: 08/01/2015 14:04   Ct Head Wo Contrast  08/01/2015  CLINICAL DATA:  Weakness. EXAM: CT HEAD WITHOUT CONTRAST TECHNIQUE: Contiguous axial images were obtained from the base of the skull through the vertex without intravenous contrast. COMPARISON:  None. FINDINGS: No evidence of parenchymal hemorrhage or extra-axial fluid collection. No mass lesion, mass effect, or midline shift. No CT evidence of acute infarction. Intracranial atherosclerosis. Nonspecific mild subcortical and periventricular white matter hypodensity, most in keeping with chronic small vessel ischemic change. Cerebral volume is age appropriate. No ventriculomegaly. The visualized paranasal sinuses are essentially clear. The mastoid air  cells are unopacified. No evidence of calvarial fracture. IMPRESSION: 1.  No evidence of acute intracranial abnormality. 2. Mild chronic small vessel ischemia. Electronically Signed   By: Delbert PhenixJason A Poff M.D.   On: 08/01/2015 14:14   Ct Angio Chest Pe W/cm &/or Wo Cm  08/02/2015  CLINICAL DATA:  Midsternal chest pain.  Elevated D-dimer. EXAM: CT ANGIOGRAPHY CHEST WITH CONTRAST TECHNIQUE: Multidetector CT imaging of the chest was performed using the standard protocol during bolus administration of intravenous contrast. Multiplanar CT image reconstructions and MIPs were obtained to evaluate the vascular anatomy. CONTRAST:  100 mL Isovue 370 IV COMPARISON:  Radiographs yesterday.  Chest CT 04/14/2015 FINDINGS: Motion artifact through the lung bases limiting assessment, there are otherwise no filling defects in the pulmonary arteries to suggest pulmonary embolus. Normal caliber thoracic aorta without dissection, moderate atherosclerosis. Small anterior  mediastinal lymph nodes, largest measures 7 mm short axis. No hilar adenopathy. No pericardial effusion. Rounded lingular opacity measures 3.5 x 2.0 cm, with adjacent ground-glass and patchy opacities in the anterior left upper lobe. Probable air bronchograms peripherally. This is at site of atelectasis on prior exam. Additional linear atelectasis elsewhere within both lungs. Linear opacities at the lung apices, right greater than left may be scarring. Tiny reticular nodular opacities in the subpleural right upper lobe image 75 series 6, nonspecific. Mild central bronchial thickening appears chronic. No pleural effusion. Patulous esophagus. No acute abnormality in the included upper abdomen. There are no acute or suspicious osseous abnormalities. Degenerative change in the lower thoracic spine. Review of the MIP images confirms the above findings. IMPRESSION: 1. No pulmonary embolus allowing for motion artifact in the lower lobes. 2. Rounded lingular opacity measuring 3.5 cm with adjacent ground-glass and patchy opacities. Favor infectious etiology such as pneumonia, as atelectasis was seen in this region on exam 3 months prior. Followup chest CT (this region not well visualized on radiograph) is recommended in 3-4 weeks following trial of antibiotic therapy to ensure resolution and exclude underlying malignancy. 3. Tiny nodular opacities in the subpleural right upper lobe are nonspecific, attention to this at follow-up recommended. Electronically Signed   By: Rubye OaksMelanie  Ehinger M.D.   On: 08/02/2015 03:45   Mr Maxine GlennMra Head Wo Contrast  08/02/2015  CLINICAL DATA:  Slurred speech and weakness for 2 days. EXAM: MRI HEAD WITHOUT CONTRAST MRA HEAD WITHOUT CONTRAST TECHNIQUE: Multiplanar, multiecho pulse sequences of the brain and surrounding structures were obtained without intravenous contrast. Angiographic images of the head were obtained using MRA technique without contrast. COMPARISON:  Head CT 08/01/2015 FINDINGS: MRI  HEAD FINDINGS Calvarium and upper cervical spine: No focal marrow signal abnormality. Orbits: Mild staphyloma. Sinuses and Mastoids: Clear. Brain: There is a chronic lacune within the left posterior frontal subcortical white matter with fluid intensity and peripheral gliotic ram. Cranial to this level is 7 mm diffusion hyperintensity without matching shine through on ADC map, compatible with recent infarct (acute or subacute). There is no superimposed hemorrhage. Patchy FLAIR hyperintensity in the bilateral cerebral white matter with thin rim of around the lateral ventricles, attributed to chronic small vessel disease given patient's multiple vascular risk factors. Cerebral volume is normal. No evidence of major vessel occlusion. MRA HEAD FINDINGS Symmetric carotid and vertebral arteries. Right PICA is not seen, but large right AICA. Otherwise standard vertebrobasilar branching. An anterior communicating artery is small if present. Bilateral posterior communicating arteries. Major vessels are smooth and patent. No major branch occlusion. Negative for aneurysm. Flow is seen in the straight sinus,  attributed to direction of a superior vermian vein rather than fistula. IMPRESSION: 1. Recent subcentimeter infarct in the subcortical posterior left frontal lobe. 2. Elsewhere mild white matter disease attributed to chronic small vessel ischemia. 3. Negative intracranial MRA. Electronically Signed   By: Marnee Spring M.D.   On: 08/02/2015 09:06   Mr Brain Wo Contrast  08/02/2015  CLINICAL DATA:  Slurred speech and weakness for 2 days. EXAM: MRI HEAD WITHOUT CONTRAST MRA HEAD WITHOUT CONTRAST TECHNIQUE: Multiplanar, multiecho pulse sequences of the brain and surrounding structures were obtained without intravenous contrast. Angiographic images of the head were obtained using MRA technique without contrast. COMPARISON:  Head CT 08/01/2015 FINDINGS: MRI HEAD FINDINGS Calvarium and upper cervical spine: No focal marrow  signal abnormality. Orbits: Mild staphyloma. Sinuses and Mastoids: Clear. Brain: There is a chronic lacune within the left posterior frontal subcortical white matter with fluid intensity and peripheral gliotic ram. Cranial to this level is 7 mm diffusion hyperintensity without matching shine through on ADC map, compatible with recent infarct (acute or subacute). There is no superimposed hemorrhage. Patchy FLAIR hyperintensity in the bilateral cerebral white matter with thin rim of around the lateral ventricles, attributed to chronic small vessel disease given patient's multiple vascular risk factors. Cerebral volume is normal. No evidence of major vessel occlusion. MRA HEAD FINDINGS Symmetric carotid and vertebral arteries. Right PICA is not seen, but large right AICA. Otherwise standard vertebrobasilar branching. An anterior communicating artery is small if present. Bilateral posterior communicating arteries. Major vessels are smooth and patent. No major branch occlusion. Negative for aneurysm. Flow is seen in the straight sinus, attributed to direction of a superior vermian vein rather than fistula. IMPRESSION: 1. Recent subcentimeter infarct in the subcortical posterior left frontal lobe. 2. Elsewhere mild white matter disease attributed to chronic small vessel ischemia. 3. Negative intracranial MRA. Electronically Signed   By: Marnee Spring M.D.   On: 08/02/2015 09:06   US Carotid Bilateral  08/02/2015  CLINICAL DATA:  TIA: Confusion and slurred speech. Hypertension, hyperlipidemia, diabetes. EXAM: BILATERAL CAROTID DUPLEX ULTRASOUND TECHNIQUE: Wallace Cullens scale imaging, color Doppler and duplex ultrasound was performed of bilateral carotid and vertebral arteries in the neck. COMPARISON:  None. TECHNIQUE: Quantification of carotid stenosis is based on velocity parameters that correlate the residual internal carotid diameter with NASCET-based stenosis levels, using the diameter of the distal internal carotid lumen  as the denominator for stenosis measurement. The following velocity measurements were obtained: PEAK SYSTOLIC/END DIASTOLIC RIGHT ICA:                     144/43cm/sec CCA:                     112/22cm/sec SYSTOLIC ICA/CCA RATIO:  1.3 DIASTOLIC ICA/CCA RATIO: 1.9 ECA:                     162cm/sec LEFT ICA:                     123/21cm/sec CCA:                     143/34cm/sec SYSTOLIC ICA/CCA RATIO:  0.9 DIASTOLIC ICA/CCA RATIO: 0.6 ECA:                     154cm/sec FINDINGS: RIGHT CAROTID ARTERY: Eccentric calcified plaque in the carotid bulb extending to the proximal internal and external carotid arteries resulting in only mild stenosis.  Normal waveforms and color Doppler signal. RIGHT VERTEBRAL ARTERY:  Normal flow direction and waveform. LEFT CAROTID ARTERY: Eccentric calcified plaque in the distal common carotid artery and bulb extending into proximal internal and external carotid arteries without high-grade stenosis. Normal waveforms and color Doppler signal. LEFT VERTEBRAL ARTERY: Normal flow direction and waveform. IMPRESSION: 1. Bilateral carotid bifurcation and proximal ICA plaque, resulting in less than 50% diameter stenosis. The exam does not exclude plaque ulceration or embolization. Continued surveillance recommended. 2.  Antegrade bilateral vertebral arterial flow. Electronically Signed   By: Corlis Leak M.D.   On: 08/02/2015 10:16   Dg Chest Port 1 View  08/02/2015  CLINICAL DATA:  Acute onset of fever. Inability to walk. Initial encounter. EXAM: PORTABLE CHEST 1 VIEW COMPARISON:  Chest radiograph performed earlier today at 12:43 p.m. FINDINGS: The lungs are well-aerated. Mild vascular congestion is noted. There is no evidence of focal opacification, pleural effusion or pneumothorax. The cardiomediastinal silhouette is borderline normal in size. No acute osseous abnormalities are seen. IMPRESSION: Mild vascular congestion noted.  Lungs otherwise grossly clear. Electronically Signed   By: Roanna Raider M.D.   On: 08/02/2015 02:21    Microbiology: Recent Results (from the past 240 hour(s))  Culture, blood (routine x 2)     Status: None (Preliminary result)   Collection Time: 08/01/15 10:23 PM  Result Value Ref Range Status   Specimen Description LEFT ANTECUBITAL  Final   Special Requests BOTTLES DRAWN AEROBIC ONLY 4CC  Final   Culture NO GROWTH 2 DAYS  Final   Report Status PENDING  Incomplete  Culture, blood (routine x 2)     Status: None (Preliminary result)   Collection Time: 08/01/15 10:27 PM  Result Value Ref Range Status   Specimen Description BLOOD LEFT HAND  Final   Special Requests BOTTLES DRAWN AEROBIC ONLY 4CC  Final   Culture NO GROWTH 2 DAYS  Final   Report Status PENDING  Incomplete     Labs: Basic Metabolic Panel:  Recent Labs Lab 08/01/15 1220 08/01/15 2223 08/01/15 2231  NA 134* 135  --   K 3.2* 2.7*  --   CL 95* 95*  --   CO2 28 31  --   GLUCOSE 126* 121*  --   BUN 13 10  --   CREATININE 0.86 0.92  --   CALCIUM 9.9 9.6  --   MG  --   --  1.8   Liver Function Tests: No results for input(s): AST, ALT, ALKPHOS, BILITOT, PROT, ALBUMIN in the last 168 hours. No results for input(s): LIPASE, AMYLASE in the last 168 hours. No results for input(s): AMMONIA in the last 168 hours. CBC:  Recent Labs Lab 08/01/15 1220 08/01/15 2223  WBC 18.0* 16.6*  NEUTROABS 14.0*  --   HGB 12.8 12.3  HCT 39.2 37.9  MCV 85.0 85.4  PLT 267 247   Cardiac Enzymes:  Recent Labs Lab 08/01/15 2223 08/02/15 0455 08/02/15 1036  TROPONINI <0.03 <0.03 <0.03   BNP: BNP (last 3 results) No results for input(s): BNP in the last 8760 hours.  ProBNP (last 3 results) No results for input(s): PROBNP in the last 8760 hours.  CBG:  Recent Labs Lab 08/02/15 0745 08/02/15 1117 08/02/15 1715 08/02/15 2051 08/03/15 0718  GLUCAP 131* 160* 152* 117* 107*       Signed:  HERNANDEZ ACOSTA,ESTELA  [facility] Pager: [phone] 08/03/2015, 11:34  AM

## 2015-08-03 NOTE — Progress Notes (Signed)
[facility]  Pt left with son, belongings, prescriptions, and IV removed and intact.

## 2015-08-03 NOTE — Progress Notes (Signed)
Physical Therapy Treatment Patient Details Name: Laura Dalton MRN: [id number] DOB: 1950/03/15 Today's Date: 08/03/2015    History of Present Illness 65 yo F admitted after episode of slurred speech, and R facial droop.  Head CT (-) for acute changes, CXR (+) PNA, MRI (+) subcentimeter infarct in the subcortical posterior L frontal lobe.  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     PT Comments    Pt sitting EOB eager to ambulate and hopes to be discharged today to home.  Pt without complaints and required no assistance from therapist today.  Pt with noted safety completing transfers and general mobility today.  Pt with CG present.  Returned to bedside per request.    Follow Up Recommendations  Home health PT     Equipment Recommendations  Rolling walker with 5" wheels;Other (comment) (Pt has a Rollator at home but due to R LE weakness and poor gait, she may ned RW)       Precautions / Restrictions Precautions Precautions: Fall Restrictions Weight Bearing Restrictions: No    Mobility  Bed Mobility Overal bed mobility: Modified Independent                Transfers Overall transfer level: Modified independent Equipment used: Rolling walker (2 wheeled) Transfers: Sit to/from Stand Sit to Stand: Modified independent (Device/Increase time)            Ambulation/Gait Ambulation/Gait assistance: Modified independent (Device/Increase time) Ambulation Distance (Feet): 100 Feet Assistive device: Rolling walker (2 wheeled) Gait Pattern/deviations: Step-through pattern;Trunk flexed     General Gait Details: Pt demonstrates fwd flexed posture at the hips, and head down.  Vc's and tc's required for good upright posture, however pt is not able to maintain this. Improved stride and activity  tolerance today   Stairs            Wheelchair Mobility    Modified Rankin (Stroke Patients Only)       Balance                                    Cognition Arousal/Alertness: Awake/alert Behavior During Therapy: WFL for tasks assessed/performed Overall Cognitive Status: Within Functional Limits for tasks assessed                 General Comments: Pt with only supervision required; reported plans to be discharged today.    Exercises      General Comments        Pertinent Vitals/Pain Pain Assessment: No/denies pain    Home Living                      Prior Function            PT Goals (current goals can now be found in the care plan section)      Frequency       PT Plan Other (comment) (Continue in next venue of care)    Co-evaluation             End of Session Equipment Utilized During Treatment: Gait belt Activity Tolerance: Patient tolerated treatment well Patient left: with call bell/phone within reach;in bed;with family/visitor present     Time: 1115-1130 PT Time Calculation (min) (ACUTE ONLY): 15 min  Charges:  $Gait Training: 8-22 mins                      [name], PTA/CLT [phone]  08/03/2015, 1:09 PM

## 2015-08-05 ENCOUNTER — Ambulatory Visit (INDEPENDENT_AMBULATORY_CARE_PROVIDER_SITE_OTHER): Payer: PPO | Admitting: Cardiology

## 2015-08-05 ENCOUNTER — Encounter: Payer: Self-pay | Admitting: Cardiology

## 2015-08-05 VITALS — BP 104/62 | HR 89 | Ht 66.0 in | Wt 197.0 lb

## 2015-08-05 DIAGNOSIS — I10 Essential (primary) hypertension: Secondary | ICD-10-CM | POA: Diagnosis not present

## 2015-08-05 DIAGNOSIS — R0789 Other chest pain: Secondary | ICD-10-CM | POA: Diagnosis not present

## 2015-08-05 DIAGNOSIS — I639 Cerebral infarction, unspecified: Secondary | ICD-10-CM | POA: Diagnosis not present

## 2015-08-05 DIAGNOSIS — E785 Hyperlipidemia, unspecified: Secondary | ICD-10-CM

## 2015-08-05 NOTE — Patient Instructions (Signed)
Continue all current medications. Your physician wants you to follow up in:  1 year.  You will receive a reminder letter in the mail one-two months in advance.  If you don't receive a letter, please call our office to schedule the follow up appointment   

## 2015-08-05 NOTE — Progress Notes (Signed)
Patient ID: Laura Dalton, female   DOB: 1950-08-16, 65 y.o.   MRN: [id number]     Clinical Summary [name] is a 65 y.o.female seen today for follow up of the following medical problems.   1. Chest pain - patient seen previously for episodes of chest pain. She has an abnormal EKG at baseline, and completed an echo and stress echo that were overall unremarkable at that time.  - no recent chest pain since last visit   2. HTN - compliant with meds - does not check at home  3. HL - 07/2015 lipids: TC 107 TG 74 HDL 42 LDL 50  - compliant with statin  4. CVA - admit 07/2015 with CVA. Has no long term residual deficits.   5. Carotid stenosis - carotid US 07/2015 mild bilateral <50% ICA disease  Past Medical History  Diagnosis Date  . Bursitis of hip   . Low back pain     benign turmor back  . COPD (chronic obstructive pulmonary disease) (HCC)   . Hypothyroidism   . Vitamin D deficiency disease   . Hypertension   . Diabetes mellitus without complication (HCC)     type II     Allergies  Allergen Reactions  . Aspirin Hives     Current Outpatient Prescriptions  Medication Sig Dispense Refill  . amitriptyline (ELAVIL) 100 MG tablet Take 1 tablet (100 mg total) by mouth at bedtime. 90 tablet 3  . amLODipine (NORVASC) 2.5 MG tablet TAKE ONE TABLET BY MOUTH ONCE DAILY 90 tablet 1  . atorvastatin (LIPITOR) 40 MG tablet Take 1 tablet (40 mg total) by mouth daily. 90 tablet 1  . clopidogrel (PLAVIX) 75 MG tablet Take 1 tablet (75 mg total) by mouth daily. 30 tablet 2  . fluticasone furoate-vilanterol (BREO ELLIPTA) 100-25 MCG/INH AEPB Inhale 1 puff into the lungs daily. 30 each 2  . gabapentin (NEURONTIN) 600 MG tablet TAKE ONE TABLET BY MOUTH THREE TIMES DAILY 270 tablet 0  . levofloxacin (LEVAQUIN) 750 MG tablet Take 1 tablet (750 mg total) by mouth daily. 7 tablet 0  . levothyroxine (SYNTHROID) 75 MCG tablet Take 1 tablet (75 mcg total) by mouth daily before breakfast. 90  tablet 1  . losartan-hydrochlorothiazide (HYZAAR) 100-25 MG tablet Take 1 tablet by mouth daily. 90 tablet 2  . metFORMIN (GLUCOPHAGE) 500 MG tablet TAKE ONE TABLET BY MOUTh once DAILY 90 tablet 3   No current facility-administered medications for this visit.     Past Surgical History  Procedure Laterality Date  . Back surgery  1996    turmor on back   . Appendectomy    . Cholecystectomy    . Leg surgery Right     "clot and had drain in leg  rt lower leg     Allergies  Allergen Reactions  . Aspirin Hives      Family History  Problem Relation Age of Onset  . Diabetes Mother   . Cancer Father      Social History [name] reports that she quit smoking about 7 years ago. Her smoking use included Cigarettes. She started smoking about 53 years ago. She has a 22.5 pack-year smoking history. She has never used smokeless tobacco. [name] reports that she does not drink alcohol.   Review of Systems CONSTITUTIONAL: No weight loss, fever, chills, weakness or fatigue.  HEENT: Eyes: No visual loss, blurred vision, double vision or yellow sclerae.No hearing loss, sneezing, congestion, runny nose or sore throat.  SKIN: No rash or itching.  CARDIOVASCULAR: per HPI RESPIRATORY: No shortness of breath, cough or sputum.  GASTROINTESTINAL: No anorexia, nausea, vomiting or diarrhea. No abdominal pain or blood.  GENITOURINARY: No burning on urination, no polyuria NEUROLOGICAL: No headache, dizziness, syncope, paralysis, ataxia, numbness or tingling in the extremities. No change in bowel or bladder control.  MUSCULOSKELETAL: No muscle, back pain, joint pain or stiffness.  LYMPHATICS: No enlarged nodes. No history of splenectomy.  PSYCHIATRIC: No history of depression or anxiety.  ENDOCRINOLOGIC: No reports of sweating, cold or heat intolerance. No polyuria or polydipsia.  Marland Kitchen.   Physical Examination Filed Vitals:   08/05/15 0920  BP: 104/62  Pulse: 89   Filed Vitals:   08/05/15  0920  Height: 5\' 6"  (1.676 m)  Weight: 197 lb (89.359 kg)    Gen: resting comfortably, no acute distress HEENT: no scleral icterus, pupils equal round and reactive, no palptable cervical adenopathy,  CV: RRR, no m/r/g, no jvd Resp: Clear to auscultation bilaterally GI: abdomen is soft, non-tender, non-distended, normal bowel sounds, no hepatosplenomegaly MSK: extremities are warm, no edema.  Skin: warm, no rash Neuro:  no focal deficits Psych: appropriate affect   Diagnostic Studies 01/06/13 EKG: sinus rhythm, poor R-wave progression, non-specific ST/T changes  01/2013 echo Study Conclusions  - Left ventricle: The cavity size was normal. Wall thickness was increased in a pattern of mild LVH. Systolic function was normal. The estimated ejection fraction was in the range of 60% to 65%. Wall motion was normal; there were no regional wall motion abnormalities. Doppler parameters are consistent with abnormal left ventricular relaxation (grade 1 diastolic dysfunction). - Aortic valve: Trileaflet; mildly thickened leaflets. No significant regurgitation. Mean gradient: 4mm Hg (S). - Mitral valve: Mildly thickened leaflets . Trivial regurgitation. - Right atrium: Central venous pressure: 3mm Hg (est). - Atrial septum: No defect or patent foramen ovale was identified. - Tricuspid valve: Physiologic regurgitation. - Pulmonary arteries: PA peak pressure: 30mm Hg (S). - Pericardium, extracardiac: There was no pericardial effusion. Impressions:  - No prior study for comparison. Mild LVH with LVEF 60-65%, grade 1 diastolic dysfunction. Mildly thickened aortic and mitral valves, nosignificant regurgitant lesions. PASP 30 mm mercury. No pericardial effusion.  01/2013 Stress echo Study Conclusions  - Stress ECG conclusions: The stress ECG was negative for ischemia. - Staged echo: There was no echocardiographic evidence for stress-induced  ischemia.    Assessment and Plan  1. Chest pain/Abnormal EKG  - no current symptoms, previously normal workup including echo and stress echo - we will continue to follow clinically  2. HTN  - at goal, she will continue current meds   3. Hyperlipidemia  - at goal, continue current statin  4. CVA - secondary prevention with plavix, statin, ARB    F/u 1 year       Antoine PocheJonathan F. Kelbi Renstrom, M.D.

## 2015-08-06 ENCOUNTER — Ambulatory Visit: Payer: PPO | Admitting: Family Medicine

## 2015-08-06 LAB — CULTURE, BLOOD (ROUTINE X 2)
Culture: NO GROWTH
Culture: NO GROWTH

## 2015-08-11 ENCOUNTER — Telehealth: Payer: Self-pay | Admitting: Family Medicine

## 2015-09-02 ENCOUNTER — Other Ambulatory Visit: Payer: Self-pay | Admitting: Family

## 2015-09-03 NOTE — Telephone Encounter (Signed)
Last seen 07/02/15 Dr Ermalinda MemosBradshaw

## 2015-09-06 ENCOUNTER — Encounter: Payer: Self-pay | Admitting: Physician Assistant

## 2015-09-06 ENCOUNTER — Ambulatory Visit (INDEPENDENT_AMBULATORY_CARE_PROVIDER_SITE_OTHER): Payer: PPO | Admitting: Physician Assistant

## 2015-09-06 VITALS — BP 139/82 | HR 83 | Temp 98.0°F | Ht 66.0 in | Wt 200.4 lb

## 2015-09-06 DIAGNOSIS — M25531 Pain in right wrist: Secondary | ICD-10-CM | POA: Diagnosis not present

## 2015-09-06 NOTE — Progress Notes (Signed)
Subjective:     Patient ID: Laura AlarVerna Dianne Dalton, female   DOB: 06/18/1950, 65 y.o.   MRN: [id number]  HPI Pt with swelling and pain to the R wrist x 1 week She does a lot of canning and sewing but denies any definite injury R hand dominant Using OTC NSAIDS No OTC bracing No hx of same  Review of Systems + pain and swelling to the wrist + intermit. Numbness to the 3-4th fingers    Objective:   Physical Exam + palmar radial edema No definite cyst noted Sl TTP of same Good pulses Decrease in ROM due to pain Grip strength is good Good cap rf and sensory to all finger No thenar wasting seen    Assessment:     1. Right wrist pain        Plan:     OTC bracing- approp brace reviewed Try to minimize some of her activities Heat/Ice No rings to this hand when swelling F/U prn

## 2015-09-06 NOTE — Patient Instructions (Signed)
Wrist Pain There are many things that can cause wrist pain. Some common causes include:  An injury to the wrist area, such as a sprain, strain, or fracture.  Overuse of the joint.  A condition that causes increased pressure on a nerve in the wrist (carpal tunnel syndrome).  Wear and tear of the joints that occurs with aging (osteoarthritis).  A variety of other types of arthritis. Sometimes, the cause of wrist pain is not known. The pain often goes away when you follow your health care provider's instructions for relieving pain at home. If your wrist pain continues, tests may need to be done to diagnose your condition. HOME CARE INSTRUCTIONS Pay attention to any changes in your symptoms. Take these actions to help with your pain:  Rest the wrist area for at least 48 hours or as told by your health care provider.  If directed, apply ice to the injured area:  Put ice in a plastic bag.  Place a towel between your skin and the bag.  Leave the ice on for 20 minutes, 2-3 times per day.  Keep your arm raised (elevated) above the level of your heart while you are sitting or lying down.  If a splint or elastic bandage has been applied, use it as told by your health care provider.  Remove the splint or bandage only as told by your health care provider.  Loosen the splint or bandage if your fingers become numb or have a tingling feeling, or if they turn cold or blue.  Take over-the-counter and prescription medicines only as told by your health care provider.  Keep all follow-up visits as told by your health care provider. This is important. SEEK MEDICAL CARE IF:  Your pain is not helped by treatment.  Your pain gets worse. SEEK IMMEDIATE MEDICAL CARE IF:  Your fingers become swollen.  Your fingers turn white, very red, or cold and blue.  Your fingers are numb or have a tingling feeling.  You have difficulty moving your fingers.   This information is not intended to replace  advice given to you by your health care provider. Make sure you discuss any questions you have with your health care provider.   Document Released: 12/07/2004 Document Revised: 11/18/2014 Document Reviewed: 07/15/2014 Elsevier Interactive Patient Education 2016 [employer]  

## 2015-09-15 ENCOUNTER — Ambulatory Visit: Payer: PPO | Admitting: Family

## 2015-09-23 ENCOUNTER — Encounter: Payer: Self-pay | Admitting: Family Medicine

## 2015-09-23 ENCOUNTER — Ambulatory Visit (INDEPENDENT_AMBULATORY_CARE_PROVIDER_SITE_OTHER): Payer: PPO | Admitting: Family Medicine

## 2015-09-23 VITALS — BP 133/76 | HR 72 | Temp 98.2°F | Ht 66.0 in | Wt 198.0 lb

## 2015-09-23 DIAGNOSIS — G5621 Lesion of ulnar nerve, right upper limb: Secondary | ICD-10-CM

## 2015-09-23 MED ORDER — TRAMADOL HCL 50 MG PO TABS: 50.0000 mg | ORAL_TABLET | Freq: Three times a day (TID) | ORAL | Status: AC | PRN

## 2015-09-23 MED ORDER — PREDNISONE 20 MG PO TABS
ORAL_TABLET | ORAL | Status: DC
Start: 2015-09-23 — End: 2015-10-07

## 2015-09-23 NOTE — Progress Notes (Signed)
BP 133/76 mmHg  Pulse 72  Temp(Src) 98.2 F (36.8 C) (Oral)  Ht 5\' 6"  (1.676 m)  Wt 198 lb (89.812 kg)  BMI 31.97 kg/m2   Subjective:    Patient ID: Laura Dalton Maners, female    DOB: 03/01/1951, 65 y.o.   MRN: [id number]  HPI: Laura Dalton Rhew is a 65 y.o. female presenting on 09/23/2015 for Hand Pain   HPI Wrist pain Patient has wrist pain on her lateral right wrist that is causing numbness in her fourth and fifth and half of her third digit on the palmar surface. The pain comes and shooting waves and is worsened with certain range of motion. She denies any fevers or chills or overlying skin changes. She denies any weakness that she's noted.  Relevant past medical, surgical, family and social history reviewed and updated as indicated. Interim medical history since our last visit reviewed. Allergies and medications reviewed and updated.  Review of Systems  Constitutional: Negative for fever and chills.  HENT: Negative for congestion, ear discharge and ear pain.   Eyes: Negative for redness and visual disturbance.  Respiratory: Negative for chest tightness and shortness of breath.   Cardiovascular: Negative for chest pain and leg swelling.  Genitourinary: Negative for dysuria and difficulty urinating.  Musculoskeletal: Positive for myalgias and arthralgias. Negative for back pain, joint swelling and gait problem.  Skin: Negative for rash.  Neurological: Negative for light-headedness and headaches.  Psychiatric/Behavioral: Negative for behavioral problems and agitation.  All other systems reviewed and are negative.   Per HPI unless specifically indicated above     Medication List       This list is accurate as of: 09/23/15  8:28 AM.  Always use your most recent med list.               amitriptyline 100 MG tablet  Commonly known as:  ELAVIL  TAKE ONE TABLET BY MOUTH ONCE DAILY AT BEDTIME     amLODipine 2.5 MG tablet  Commonly known as:  NORVASC  TAKE ONE TABLET BY  MOUTH ONCE DAILY     atorvastatin 40 MG tablet  Commonly known as:  LIPITOR  Take 1 tablet (40 mg total) by mouth daily.     clopidogrel 75 MG tablet  Commonly known as:  PLAVIX  Take 1 tablet (75 mg total) by mouth daily.     fluticasone furoate-vilanterol 100-25 MCG/INH Aepb  Commonly known as:  BREO ELLIPTA  Inhale 1 puff into the lungs daily.     gabapentin 600 MG tablet  Commonly known as:  NEURONTIN  TAKE ONE TABLET BY MOUTH THREE TIMES DAILY     levothyroxine 75 MCG tablet  Commonly known as:  SYNTHROID  Take 1 tablet (75 mcg total) by mouth daily before breakfast.     losartan-hydrochlorothiazide 100-25 MG tablet  Commonly known as:  HYZAAR  Take 1 tablet by mouth daily.     meloxicam 7.5 MG tablet  Commonly known as:  MOBIC  Take 7.5 mg by mouth daily. Reported on 09/23/2015     metFORMIN 500 MG tablet  Commonly known as:  GLUCOPHAGE  TAKE ONE TABLET BY MOUTh once DAILY     predniSONE 20 MG tablet  Commonly known as:  DELTASONE  2 po at same time daily for 5 days           Objective:    BP 133/76 mmHg  Pulse 72  Temp(Src) 98.2 F (36.8 C) (Oral)  Ht 5'  6" (1.676 m)  Wt 198 lb (89.812 kg)  BMI 31.97 kg/m2  Wt Readings from Last 3 Encounters:  09/23/15 198 lb (89.812 kg)  09/06/15 200 lb 6.4 oz (90.901 kg)  08/05/15 197 lb (89.359 kg)    Physical Exam  Constitutional: She is oriented to person, place, and time. She appears well-developed and well-nourished. No distress.  Eyes: Conjunctivae and EOM are normal. Pupils are equal, round, and reactive to light.  Neck: Neck supple. No thyromegaly present.  Cardiovascular: Normal rate, regular rhythm, normal heart sounds and intact distal pulses.   No murmur heard. Pulmonary/Chest: Effort normal and breath sounds normal. No respiratory distress. She has no wheezes.  Musculoskeletal: Normal range of motion. She exhibits no edema.       Right wrist: She exhibits tenderness (Medial tenderness, worsened by  flexion and extension, numbness in fourth and fifth fingertips). She exhibits normal range of motion, no swelling, no crepitus and no deformity.  Lymphadenopathy:    She has no cervical adenopathy.  Neurological: She is alert and oriented to person, place, and time. Coordination normal.  Skin: Skin is warm and dry. No rash noted. She is not diaphoretic.  Psychiatric: She has a normal mood and affect. Her behavior is normal.  Nursing note and vitals reviewed.      Assessment & Plan:   Problem List Items Addressed This Visit    None    Visit Diagnoses    Ulnar tunnel syndrome of right wrist    -  Primary    Relevant Medications    predniSONE (DELTASONE) 20 MG tablet        Follow up plan: Return in about 2 weeks (around 10/07/2015), or if symptoms worsen or fail to improve, for Diabetes checkup.  Counseling provided for all of the vaccine components No orders of the defined types were placed in this encounter.    Arville Care, MD [facility] 09/23/2015, 8:28 AM

## 2015-10-07 ENCOUNTER — Ambulatory Visit (INDEPENDENT_AMBULATORY_CARE_PROVIDER_SITE_OTHER): Payer: PPO | Admitting: Family Medicine

## 2015-10-07 ENCOUNTER — Encounter: Payer: Self-pay | Admitting: Family Medicine

## 2015-10-07 VITALS — BP 129/82 | HR 89 | Temp 97.0°F | Ht 66.0 in | Wt 197.4 lb

## 2015-10-07 DIAGNOSIS — M19049 Primary osteoarthritis, unspecified hand: Secondary | ICD-10-CM

## 2015-10-07 DIAGNOSIS — M129 Arthropathy, unspecified: Secondary | ICD-10-CM | POA: Diagnosis not present

## 2015-10-07 DIAGNOSIS — E1142 Type 2 diabetes mellitus with diabetic polyneuropathy: Secondary | ICD-10-CM | POA: Diagnosis not present

## 2015-10-07 DIAGNOSIS — I10 Essential (primary) hypertension: Secondary | ICD-10-CM | POA: Diagnosis not present

## 2015-10-07 DIAGNOSIS — E876 Hypokalemia: Secondary | ICD-10-CM | POA: Diagnosis not present

## 2015-10-07 LAB — BAYER DCA HB A1C WAIVED: HB A1C (BAYER DCA - WAIVED): 6.5 % (ref <7.0)

## 2015-10-07 NOTE — Progress Notes (Signed)
BP 129/82 (BP Location: Left Arm, Patient Position: Sitting, Cuff Size: Large)   Pulse 89   Temp 97 F (36.1 C) (Oral)   Ht _0  (1.676 m)   Wt 197 lb 6.4 oz (89.5 kg)   BMI 31.86 kg/m    Subjective:    Patient ID: Laura Dalton, female    DOB: 04/15/1950, 65 y.o.   MRN: [id number]  HPI: Laura Dalton is a 65 y.o. female presenting on 10/07/2015 for Diabetes (followup) and Edema in hands/fingers   HPI Hypertension checkup Patient is on amlodipine and Hyzaar. Her blood pressure today is 129/82. Patient denies headaches, blurred vision, chest pains, shortness of breath, or weakness. Denies any side effects from medication and is content with current medication.   Diabetes recheck Patient is coming in for diabetes checkup. She is currently on metformin 500 mg daily. Her hemoglobin A1c done today came back at 6.5. She denies any effects from medication. She would like to keep her medication at current dose but continued to try diet and exercise to prevent any further elevation of her A1c.  Hypokalemia Patient had mild hypokalemia on her last lab check and we will recheck that with her labs today. She denies any palpitations or chest pain or tremors or myalgias.  Pain and arthritis and hand Patient has been having a right hand and left hand but worse in her right and in the wrists but also shooting pains going up into her fingers but on all fingers and worse on the dorsum of her hand. The pain is a 5 out of 10 and she does not know what makes it worse or what makes it better. She has been using Tylenol arthritis for the past day. She denies any numbness or weakness  Relevant past medical, surgical, family and social history reviewed and updated as indicated. Interim medical history since our last visit reviewed. Allergies and medications reviewed and updated.  Review of Systems  Constitutional: Negative for chills and fever.  HENT: Negative for congestion, ear discharge and ear  pain.   Eyes: Negative for redness and visual disturbance.  Respiratory: Negative for chest tightness and shortness of breath.   Cardiovascular: Negative for chest pain and leg swelling.  Genitourinary: Negative for difficulty urinating and dysuria.  Musculoskeletal: Positive for arthralgias. Negative for back pain, gait problem and joint swelling.  Skin: Negative for color change and rash.  Neurological: Negative for weakness, light-headedness, numbness and headaches.  Psychiatric/Behavioral: Negative for agitation and behavioral problems.  All other systems reviewed and are negative.   Per HPI unless specifically indicated above     Medication List       Accurate as of 10/07/15 11:39 AM. Always use your most recent med list.          amitriptyline 100 MG tablet Commonly known as:  ELAVIL TAKE ONE TABLET BY MOUTH ONCE DAILY AT BEDTIME   amLODipine 2.5 MG tablet Commonly known as:  NORVASC TAKE ONE TABLET BY MOUTH ONCE DAILY   atorvastatin 40 MG tablet Commonly known as:  LIPITOR Take 1 tablet (40 mg total) by mouth daily.   clopidogrel 75 MG tablet Commonly known as:  PLAVIX Take 1 tablet (75 mg total) by mouth daily.   fluticasone furoate-vilanterol 100-25 MCG/INH Aepb Commonly known as:  BREO ELLIPTA Inhale 1 puff into the lungs daily.   gabapentin 600 MG tablet Commonly known as:  NEURONTIN TAKE ONE TABLET BY MOUTH THREE TIMES DAILY   levothyroxine 75  MCG tablet Commonly known as:  SYNTHROID Take 1 tablet (75 mcg total) by mouth daily before breakfast.   losartan-hydrochlorothiazide 100-25 MG tablet Commonly known as:  HYZAAR Take 1 tablet by mouth daily.   meloxicam 7.5 MG tablet Commonly known as:  MOBIC Take 7.5 mg by mouth daily. Reported on 09/23/2015   metFORMIN 500 MG tablet Commonly known as:  GLUCOPHAGE TAKE ONE TABLET BY MOUTh once DAILY   traMADol 50 MG tablet Commonly known as:  ULTRAM Take 1 tablet (50 mg total) by mouth every 8 (eight)  hours as needed.          Objective:    BP 129/82 (BP Location: Left Arm, Patient Position: Sitting, Cuff Size: Large)   Pulse 89   Temp 97 F (36.1 C) (Oral)   Ht _0  (1.676 m)   Wt 197 lb 6.4 oz (89.5 kg)   BMI 31.86 kg/m   Wt Readings from Last 3 Encounters:  10/07/15 197 lb 6.4 oz (89.5 kg)  09/23/15 198 lb (89.8 kg)  09/06/15 200 lb 6.4 oz (90.9 kg)    Physical Exam  Constitutional: She is oriented to person, place, and time. She appears well-developed and well-nourished. No distress.  Eyes: Conjunctivae and EOM are normal. Pupils are equal, round, and reactive to light.  Neck: Neck supple. No thyromegaly present.  Cardiovascular: Normal rate, regular rhythm, normal heart sounds and intact distal pulses.   No murmur heard. Pulmonary/Chest: Effort normal and breath sounds normal. No respiratory distress. She has no wheezes.  Musculoskeletal: Normal range of motion. She exhibits no edema or tenderness.       Right hand: Normal. She exhibits normal range of motion, no tenderness, no bony tenderness, normal capillary refill, no deformity and no laceration. Normal sensation noted. Normal strength noted.       Left hand: She exhibits normal range of motion, no tenderness, no bony tenderness and normal capillary refill. Normal sensation (None noted on exam) noted. Normal strength noted.  Lymphadenopathy:    She has no cervical adenopathy.  Neurological: She is alert and oriented to person, place, and time. Coordination normal.  Skin: Skin is warm and dry. No rash noted. She is not diaphoretic.  Psychiatric: She has a normal mood and affect. Her behavior is normal.  Nursing note and vitals reviewed.   Results for orders placed or performed during the hospital encounter of 08/01/15  Culture, blood (routine x 2)  Result Value Ref Range   Specimen Description BLOOD LEFT ANTECUBITAL    Special Requests BOTTLES DRAWN AEROBIC ONLY 4CC    Culture NO GROWTH 5 DAYS    Report Status  08/06/2015 FINAL   Culture, blood (routine x 2)  Result Value Ref Range   Specimen Description BLOOD LEFT HAND    Special Requests BOTTLES DRAWN AEROBIC ONLY 4CC    Culture NO GROWTH 5 DAYS    Report Status 08/06/2015 FINAL   CBC with Differential/Platelet  Result Value Ref Range   WBC 18.0 (H) 4.0 - 10.5 K/uL   RBC 4.61 3.87 - 5.11 MIL/uL   Hemoglobin 12.8 12.0 - 15.0 g/dL   HCT 39.2 36.0 - 46.0 %   MCV 85.0 78.0 - 100.0 fL   MCH 27.8 26.0 - 34.0 pg   MCHC 32.7 30.0 - 36.0 g/dL   RDW 13.3 11.5 - 15.5 %   Platelets 267 150 - 400 K/uL   Neutrophils Relative % 78 %   Neutro Abs 14.0 (H) 1.7 - 7.7  K/uL   Lymphocytes Relative 10 %   Lymphs Abs 1.7 0.7 - 4.0 K/uL   Monocytes Relative 10 %   Monocytes Absolute 1.9 (H) 0.1 - 1.0 K/uL   Eosinophils Relative 2 %   Eosinophils Absolute 0.4 0.0 - 0.7 K/uL   Basophils Relative 0 %   Basophils Absolute 0.0 0.0 - 0.1 K/uL  Basic metabolic panel  Result Value Ref Range   Sodium 134 (L) 135 - 145 mmol/L   Potassium 3.2 (L) 3.5 - 5.1 mmol/L   Chloride 95 (L) 101 - 111 mmol/L   CO2 28 22 - 32 mmol/L   Glucose, Bld 126 (H) 65 - 99 mg/dL   BUN 13 6 - 20 mg/dL   Creatinine, Ser 0.86 0.44 - 1.00 mg/dL   Calcium 9.9 8.9 - 10.3 mg/dL   GFR calc non Af Amer >60 >60 mL/min   GFR calc Af Amer >60 >60 mL/min   Anion gap 11 5 - 15  Urinalysis, Routine w reflex microscopic (not at [facility])  Result Value Ref Range   Color, Urine YELLOW YELLOW   APPearance CLEAR CLEAR   Specific Gravity, Urine 1.010 1.005 - 1.030   pH 7.0 5.0 - 8.0   Glucose, UA NEGATIVE NEGATIVE mg/dL   Hgb urine dipstick NEGATIVE NEGATIVE   Bilirubin Urine NEGATIVE NEGATIVE   Ketones, ur NEGATIVE NEGATIVE mg/dL   Protein, ur NEGATIVE NEGATIVE mg/dL   Nitrite NEGATIVE NEGATIVE   Leukocytes, UA TRACE (A) NEGATIVE  Urine microscopic-add on  Result Value Ref Range   Squamous Epithelial / LPF 0-5 (A) NONE SEEN   WBC, UA 0-5 0 - 5 WBC/hpf   RBC / HPF 0-5 0 - 5 RBC/hpf   Bacteria,  UA RARE (A) NONE SEEN  Hemoglobin A1c  Result Value Ref Range   Hgb A1c MFr Bld 6.4 (H) 4.8 - 5.6 %   Mean Plasma Glucose 137 mg/dL  Lipid panel  Result Value Ref Range   Cholesterol 107 0 - 200 mg/dL   Triglycerides 74 <150 mg/dL   HDL 42 >40 mg/dL   Total CHOL/HDL Ratio 2.5 RATIO   VLDL 15 0 - 40 mg/dL   LDL Cholesterol 50 0 - 99 mg/dL  Hemoglobin A1c  Result Value Ref Range   Hgb A1c MFr Bld 6.5 (H) 4.8 - 5.6 %   Mean Plasma Glucose 140 mg/dL  Glucose, capillary  Result Value Ref Range   Glucose-Capillary 143 (H) 65 - 99 mg/dL  CBC  Result Value Ref Range   WBC 16.6 (H) 4.0 - 10.5 K/uL   RBC 4.44 3.87 - 5.11 MIL/uL   Hemoglobin 12.3 12.0 - 15.0 g/dL   HCT 37.9 36.0 - 46.0 %   MCV 85.4 78.0 - 100.0 fL   MCH 27.7 26.0 - 34.0 pg   MCHC 32.5 30.0 - 36.0 g/dL   RDW 13.3 11.5 - 15.5 %   Platelets 247 150 - 400 K/uL  Basic metabolic panel  Result Value Ref Range   Sodium 135 135 - 145 mmol/L   Potassium 2.7 (LL) 3.5 - 5.1 mmol/L   Chloride 95 (L) 101 - 111 mmol/L   CO2 31 22 - 32 mmol/L   Glucose, Bld 121 (H) 65 - 99 mg/dL   BUN 10 6 - 20 mg/dL   Creatinine, Ser 0.92 0.44 - 1.00 mg/dL   Calcium 9.6 8.9 - 10.3 mg/dL   GFR calc non Af Amer >60 >60 mL/min   GFR calc Af Amer >60 >  60 mL/min   Anion gap 9 5 - 15  Lactic acid, plasma  Result Value Ref Range   Lactic Acid, Venous 1.3 0.5 - 2.0 mmol/L  Lactic acid, plasma  Result Value Ref Range   Lactic Acid, Venous 1.5 0.5 - 2.0 mmol/L  Troponin I (q 6hr x 3)  Result Value Ref Range   Troponin I <0.03 <0.031 ng/mL  Troponin I (q 6hr x 3)  Result Value Ref Range   Troponin I <0.03 <0.031 ng/mL  Troponin I (q 6hr x 3)  Result Value Ref Range   Troponin I <0.03 <0.031 ng/mL  Magnesium  Result Value Ref Range   Magnesium 1.8 1.7 - 2.4 mg/dL  D-dimer, quantitative (not at [facility])  Result Value Ref Range   D-Dimer, Quant 1.24 (H) 0.00 - 0.50 ug/mL-FEU  Glucose, capillary  Result Value Ref Range   Glucose-Capillary 131  (H) 65 - 99 mg/dL  Glucose, capillary  Result Value Ref Range   Glucose-Capillary 160 (H) 65 - 99 mg/dL  Glucose, capillary  Result Value Ref Range   Glucose-Capillary 152 (H) 65 - 99 mg/dL  Glucose, capillary  Result Value Ref Range   Glucose-Capillary 117 (H) 65 - 99 mg/dL   Comment 1 Notify RN    Comment 2 Document in Chart   Glucose, capillary  Result Value Ref Range   Glucose-Capillary 107 (H) 65 - 99 mg/dL   Comment 1 Notify RN    Comment 2 Document in Chart   Glucose, capillary  Result Value Ref Range   Glucose-Capillary 123 (H) 65 - 99 mg/dL   Comment 1 Notify RN    Comment 2 Document in Chart   CBG monitoring, ED  Result Value Ref Range   Glucose-Capillary 137 (H) 65 - 99 mg/dL  Echocardiogram  Result Value Ref Range   Weight 3,139.2 oz   Height 66 in   BP 136/67 mmHg      Assessment & Plan:   Problem List Items Addressed This Visit      Cardiovascular and Mediastinum   HTN (hypertension) - Primary   Relevant Orders   CMP14+EGFR (Completed)     Nervous and Auditory   Diabetic peripheral neuropathy associated with type 2 diabetes mellitus (HCC)   Relevant Orders   Bayer DCA Hb A1c Waived (Completed)   CMP14+EGFR (Completed)    Other Visit Diagnoses    Hypokalemia       Relevant Orders   CMP14+EGFR (Completed)   Hand arthritis       Use Tylenol arthritis and Mobic, some concerning for neuropathy in hands, if not improved will send to neurology       Follow up plan: Return in about 3 months (around 01/07/2016), or if symptoms worsen or fail to improve, for Diabetes recheck and hypertension recheck.  Counseling provided for all of the vaccine components Orders Placed This Encounter  Procedures  . Bayer DCA Hb A1c Waived  . [place], MD [facility] 10/07/2015, 11:39 AM

## 2015-10-08 LAB — CMP14+EGFR
ALT: 10 IU/L (ref 0–32)
AST: 17 IU/L (ref 0–40)
Albumin/Globulin Ratio: 1.7 (ref 1.2–2.2)
Albumin: 4.4 g/dL (ref 3.6–4.8)
Alkaline Phosphatase: 67 IU/L (ref 39–117)
BUN/Creatinine Ratio: 12 (ref 12–28)
BUN: 9 mg/dL (ref 8–27)
Bilirubin Total: 0.4 mg/dL (ref 0.0–1.2)
CALCIUM: 10.1 mg/dL (ref 8.7–10.3)
CO2: 27 mmol/L (ref 18–29)
Chloride: 97 mmol/L (ref 96–106)
Creatinine, Ser: 0.74 mg/dL (ref 0.57–1.00)
GFR, EST AFRICAN AMERICAN: 98 mL/min/{1.73_m2} (ref >59)
GFR, EST NON AFRICAN AMERICAN: 85 mL/min/{1.73_m2} (ref >59)
GLUCOSE: 113 mg/dL — AB (ref 65–99)
Globulin, Total: 2.6 g/dL (ref 1.5–4.5)
Potassium: 3.7 mmol/L (ref 3.5–5.2)
Sodium: 141 mmol/L (ref 134–144)
TOTAL PROTEIN: 7 g/dL (ref 6.0–8.5)

## 2015-10-11 ENCOUNTER — Other Ambulatory Visit: Payer: Self-pay | Admitting: Family

## 2015-10-11 ENCOUNTER — Telehealth: Payer: Self-pay | Admitting: Family Medicine

## 2015-10-11 DIAGNOSIS — E785 Hyperlipidemia, unspecified: Secondary | ICD-10-CM

## 2015-10-11 DIAGNOSIS — M25531 Pain in right wrist: Secondary | ICD-10-CM

## 2015-10-11 NOTE — Telephone Encounter (Signed)
Do referral to orthopedic

## 2015-10-11 NOTE — Telephone Encounter (Signed)
Patient states she was told to call Dr.Dettinger If her hand pain did not get any better. Patient called stating it is no better, does she need referral? Please advise

## 2015-10-11 NOTE — Telephone Encounter (Signed)
Patient states she was she was told to call back by Dr.Dettinger for her

## 2015-10-12 NOTE — Telephone Encounter (Signed)
Patient informed that we are setting up an appointment with an orthopaedic doctor and will contact her with the appt info

## 2015-10-15 DIAGNOSIS — H353110 Nonexudative age-related macular degeneration, right eye, stage unspecified: Secondary | ICD-10-CM | POA: Diagnosis not present

## 2015-10-21 DIAGNOSIS — M79641 Pain in right hand: Secondary | ICD-10-CM | POA: Diagnosis not present

## 2015-10-21 DIAGNOSIS — M79642 Pain in left hand: Secondary | ICD-10-CM | POA: Diagnosis not present

## 2015-10-21 DIAGNOSIS — M0230 Reiter's disease, unspecified site: Secondary | ICD-10-CM | POA: Diagnosis not present

## 2015-11-05 ENCOUNTER — Other Ambulatory Visit: Payer: Self-pay | Admitting: *Deleted

## 2015-11-05 MED ORDER — CLOPIDOGREL BISULFATE 75 MG PO TABS: 75.0000 mg | ORAL_TABLET | Freq: Every day | ORAL | 2 refills | Status: DC

## 2015-11-18 ENCOUNTER — Other Ambulatory Visit: Payer: Self-pay | Admitting: Family Medicine

## 2015-12-22 DIAGNOSIS — R5383 Other fatigue: Secondary | ICD-10-CM | POA: Diagnosis not present

## 2015-12-22 DIAGNOSIS — M2550 Pain in unspecified joint: Secondary | ICD-10-CM | POA: Diagnosis not present

## 2015-12-22 DIAGNOSIS — M7989 Other specified soft tissue disorders: Secondary | ICD-10-CM | POA: Diagnosis not present

## 2016-01-05 DIAGNOSIS — M0609 Rheumatoid arthritis without rheumatoid factor, multiple sites: Secondary | ICD-10-CM | POA: Diagnosis not present

## 2016-01-05 DIAGNOSIS — M150 Primary generalized (osteo)arthritis: Secondary | ICD-10-CM | POA: Diagnosis not present

## 2016-01-09 ENCOUNTER — Other Ambulatory Visit: Payer: Self-pay | Admitting: Family Medicine

## 2016-01-09 DIAGNOSIS — E785 Hyperlipidemia, unspecified: Secondary | ICD-10-CM

## 2016-01-17 ENCOUNTER — Other Ambulatory Visit: Payer: Self-pay | Admitting: Family Medicine

## 2016-01-30 ENCOUNTER — Other Ambulatory Visit: Payer: Self-pay | Admitting: Family

## 2016-02-04 ENCOUNTER — Other Ambulatory Visit: Payer: Self-pay | Admitting: Family Medicine

## 2016-02-04 ENCOUNTER — Other Ambulatory Visit: Payer: Self-pay | Admitting: Family

## 2016-02-04 DIAGNOSIS — I10 Essential (primary) hypertension: Secondary | ICD-10-CM

## 2016-02-14 ENCOUNTER — Other Ambulatory Visit: Payer: Self-pay | Admitting: Family Medicine

## 2016-02-22 DIAGNOSIS — Z6833 Body mass index (BMI) 33.0-33.9, adult: Secondary | ICD-10-CM | POA: Diagnosis not present

## 2016-02-22 DIAGNOSIS — E669 Obesity, unspecified: Secondary | ICD-10-CM | POA: Diagnosis not present

## 2016-02-22 DIAGNOSIS — M150 Primary generalized (osteo)arthritis: Secondary | ICD-10-CM | POA: Diagnosis not present

## 2016-02-22 DIAGNOSIS — M0609 Rheumatoid arthritis without rheumatoid factor, multiple sites: Secondary | ICD-10-CM | POA: Diagnosis not present

## 2016-03-08 ENCOUNTER — Other Ambulatory Visit: Payer: Self-pay | Admitting: Family Medicine

## 2016-04-11 ENCOUNTER — Other Ambulatory Visit: Payer: Self-pay | Admitting: Family Medicine

## 2016-04-11 DIAGNOSIS — E785 Hyperlipidemia, unspecified: Secondary | ICD-10-CM

## 2016-04-25 ENCOUNTER — Other Ambulatory Visit: Payer: Self-pay | Admitting: Family Medicine

## 2016-05-04 ENCOUNTER — Other Ambulatory Visit: Payer: Self-pay | Admitting: Family Medicine

## 2016-05-04 DIAGNOSIS — E785 Hyperlipidemia, unspecified: Secondary | ICD-10-CM

## 2016-05-04 DIAGNOSIS — I10 Essential (primary) hypertension: Secondary | ICD-10-CM

## 2016-05-08 ENCOUNTER — Other Ambulatory Visit: Payer: Self-pay | Admitting: Family Medicine

## 2016-05-17 ENCOUNTER — Other Ambulatory Visit: Payer: Self-pay | Admitting: Family Medicine

## 2016-05-22 IMAGING — CT CT ANGIO CHEST
2 of 6 series · 17 of 46 positions shown · IV contrast (Omnipaque 300)
Comparison: Radiographs earlier this day

CLINICAL DATA: Chest tightness radiating to the back and arms. Back
pain. Symptoms for 3 hours.

EXAM:
CT ANGIOGRAPHY CHEST, ABDOMEN AND PELVIS
TECHNIQUE: Multidetector CT imaging through the chest, abdomen and pelvis was
performed using the standard protocol during bolus administration of
intravenous contrast. Multiplanar reconstructed images and MIPs were
obtained and reviewed to evaluate the vascular anatomy.
CONTRAST:  100mL OMNIPAQUE IOHEXOL 350 MG/ML SOLN

[Series 5: dissection 3.0 b40f · axial · 0.73mm/px · z∈[-604,-46]mm · 15 of 204 slices shown]
[im 9/204  lung]
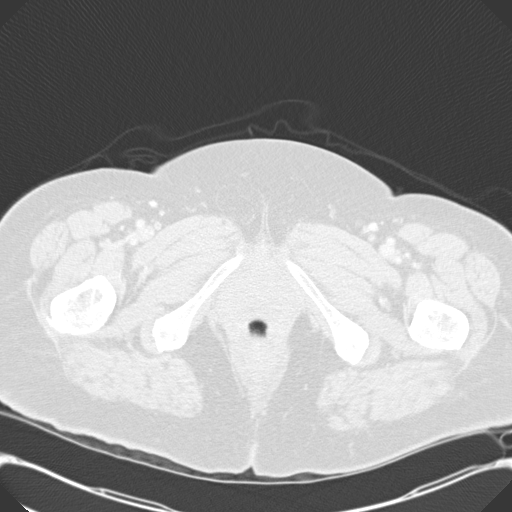
[im 26/204  soft-tissue]
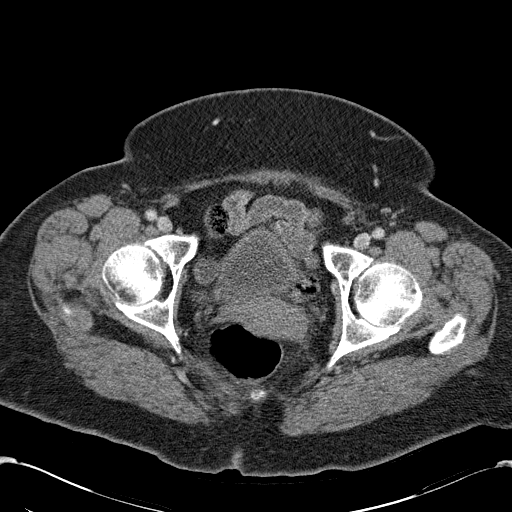
[im 34/204  lung]
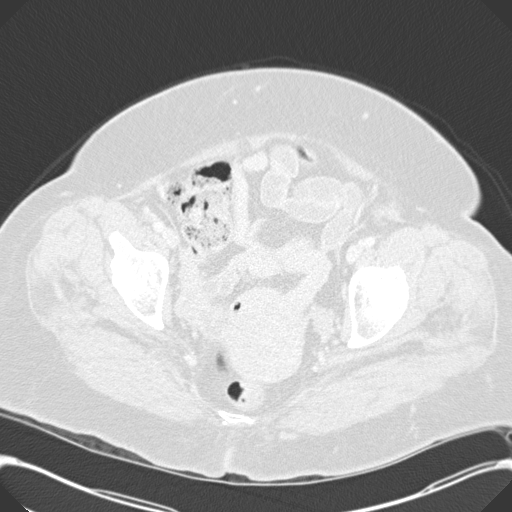
[im 51/204  soft-tissue]
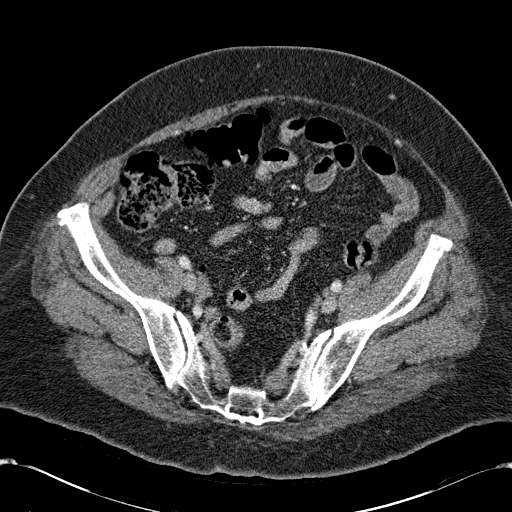
[im 60/204  lung]
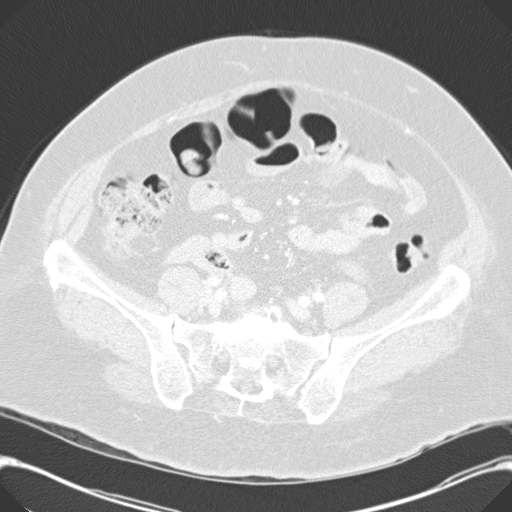
[im 77/204  soft-tissue]
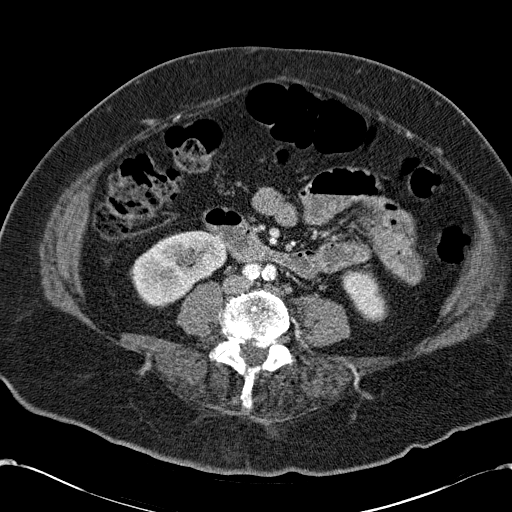
[im 85/204  lung]
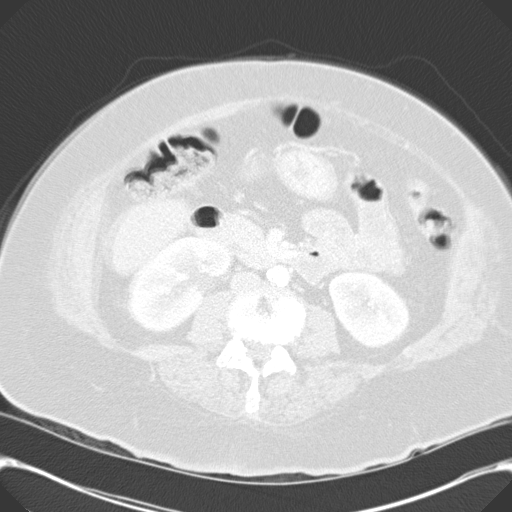
[im 102/204  soft-tissue]
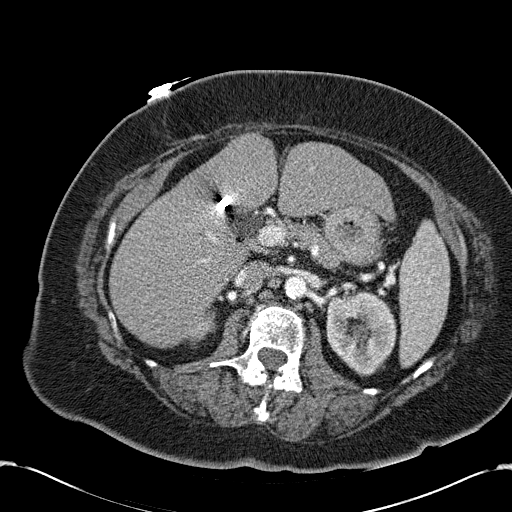
[im 119/204  lung]
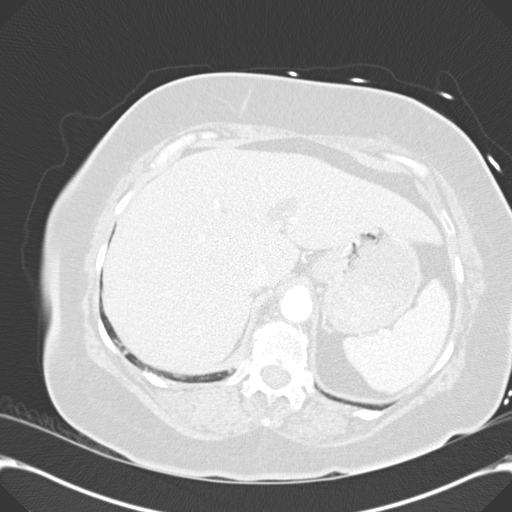
[im 127/204  soft-tissue]
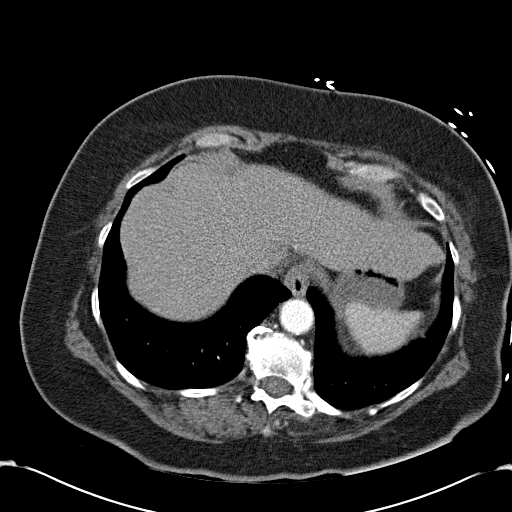
[im 144/204  lung]
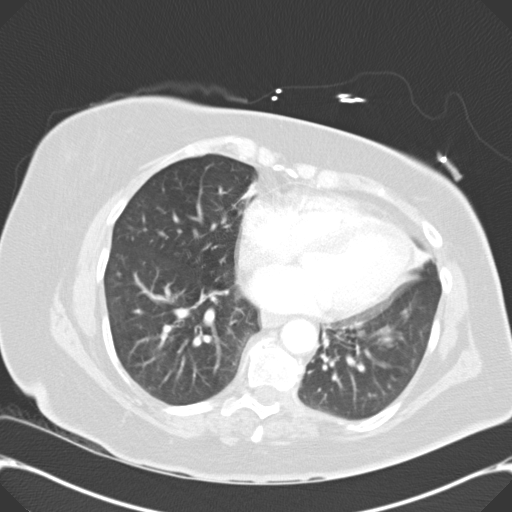
[im 153/204  soft-tissue]
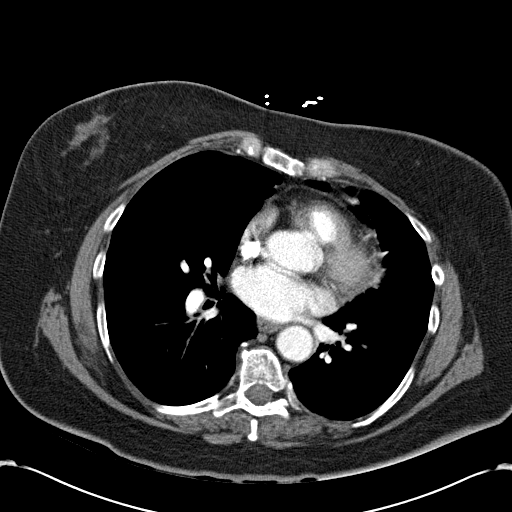
[im 170/204  lung]
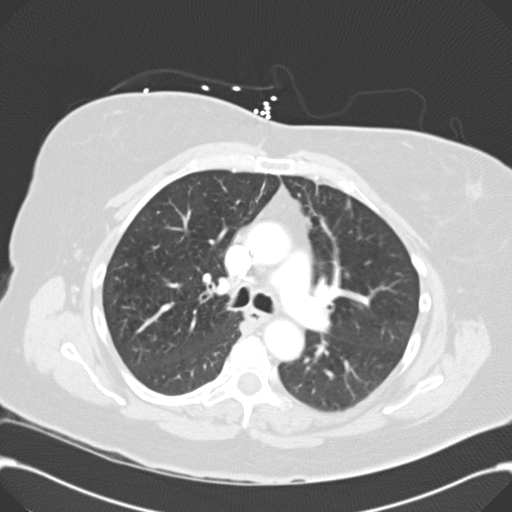
[im 178/204  soft-tissue]
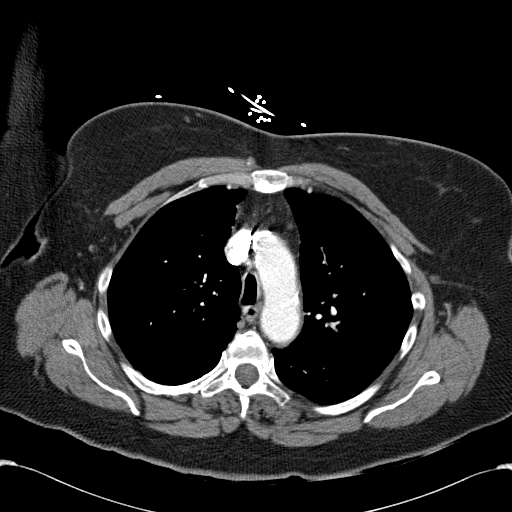
[im 195/204  lung]
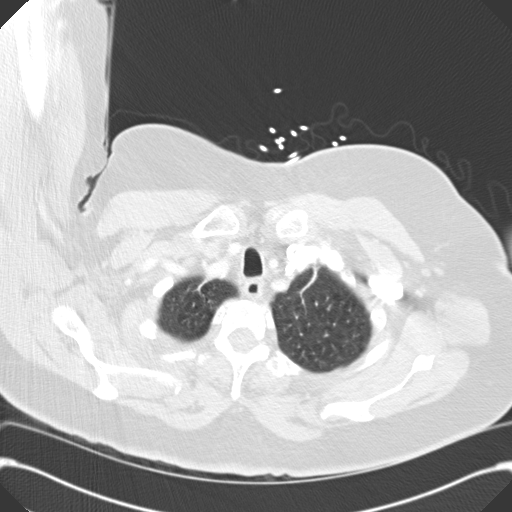

[Series 7: mpr cor post contrast · coronal · 0.96mm/px · 2 of 94 slices shown]
[im 32/94  soft-tissue]
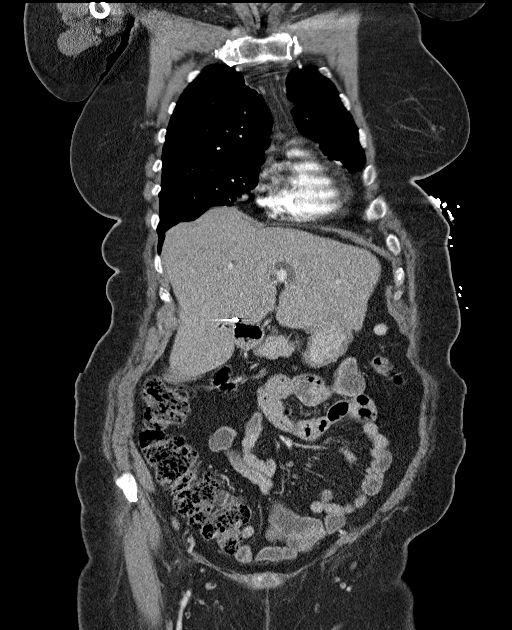
[im 63/94  soft-tissue]
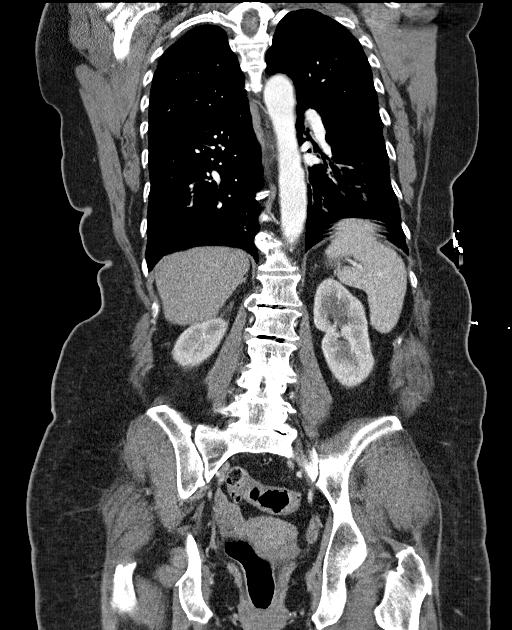

[17 of 46 positions shown; findings below may reference images not displayed]

FINDINGS: CTA CHEST FINDINGS

Normal caliber thoracic aorta without aneurysm, dissection, aortic
hematoma or acute aortic syndrome. Moderate diffuse atherosclerosis.
Common origin of the brachiocephalic and left common carotid artery,
bovine configuration.

Heart at the upper limits of normal in size. Mild coronary artery
calcifications. No mediastinal or hilar adenopathy. No pericardial
effusion.

Mild apical predominant emphysematous change. Central bronchial
thickening. No consolidation or pulmonary edema. Trace subsegmental
atelectasis in both lower lobes. No pulmonary nodule or mass. No
pleural effusion.

Degenerative change throughout thoracic spine with large anterior
osteophytes and mild disc space narrowing, most prominent in the
lower thoracic spine. Mild broad-based scoliotic curvature.
Diminutive/absent posterior left tenth rib, may be postsurgical.

Review of the MIP images confirms the above findings.

CTA ABDOMEN AND PELVIS FINDINGS

Normal caliber abdominal aorta without aneurysm or dissection.
Moderate to advanced atherosclerosis of the abdominal aorta and its
branches. Atherosclerotic calcifications at the origin of the celiac
artery but no definite narrowing. Superior mesenteric and inferior
mesenteric arteries are patent. Bilateral renal arteries are patent,
with accessory upper pole left renal artery.

Postcholecystectomy with intra and extrahepatic biliary dilatation,
likely postsurgical. No focal hepatic lesion allowing for arterial
phase imaging. Spleen is normal in size. No pancreatic ductal
dilatation or inflammation. Mild thickening of the left adrenal
gland without discrete nodule. Right adrenal gland is normal.
Symmetric renal enhancement without hydronephrosis or focal renal
abnormality.

Stomach physiologically distended with ingested contents. There are
no dilated or thickened bowel loops. Small to moderate stool burden,
no colonic wall thickening. Scattered distal colonic diverticulosis
without diverticulitis.

No retroperitoneal or mesenteric adenopathy. No free air, free
fluid, or intra-abdominal fluid collection.

In the pelvis urinary bladder is minimally distended, equivocal wall
thickening likely secondary to nondistention. Uterus is retroverted.
No adnexal mass. No pelvic free fluid.

Multilevel degenerative disc disease and facet arthropathy
throughout the lumbar spine. Mild broad-based scoliotic curvature.
There are no acute or suspicious osseous abnormalities.

Review of the MIP images confirms the above findings.
IMPRESSION: 1. Normal caliber thoracoabdominal aorta without acute aortic
abnormality. Moderate atherosclerosis.
2. No acute abnormality in the chest, abdomen, or pelvis.
3. Chronic incidental findings include mild emphysema,
postcholecystectomy with presumed postprocedural biliary dilatation,
and mild diverticulosis without diverticulitis.
4. Mild scoliotic curvature and multilevel degenerative change
throughout the thoracic and lumbar spine.

## 2016-05-22 IMAGING — CR DG CHEST 1V PORT
1 series · 1 of 1 positions shown · non-contrast
Comparison: 11/04/2013

CLINICAL DATA: Chest pain radiating to the mid back in down the
arms over the past 3 hours. COPD. Hypertension. Diabetes.

EXAM:
PORTABLE CHEST 1 VIEW

[ap]
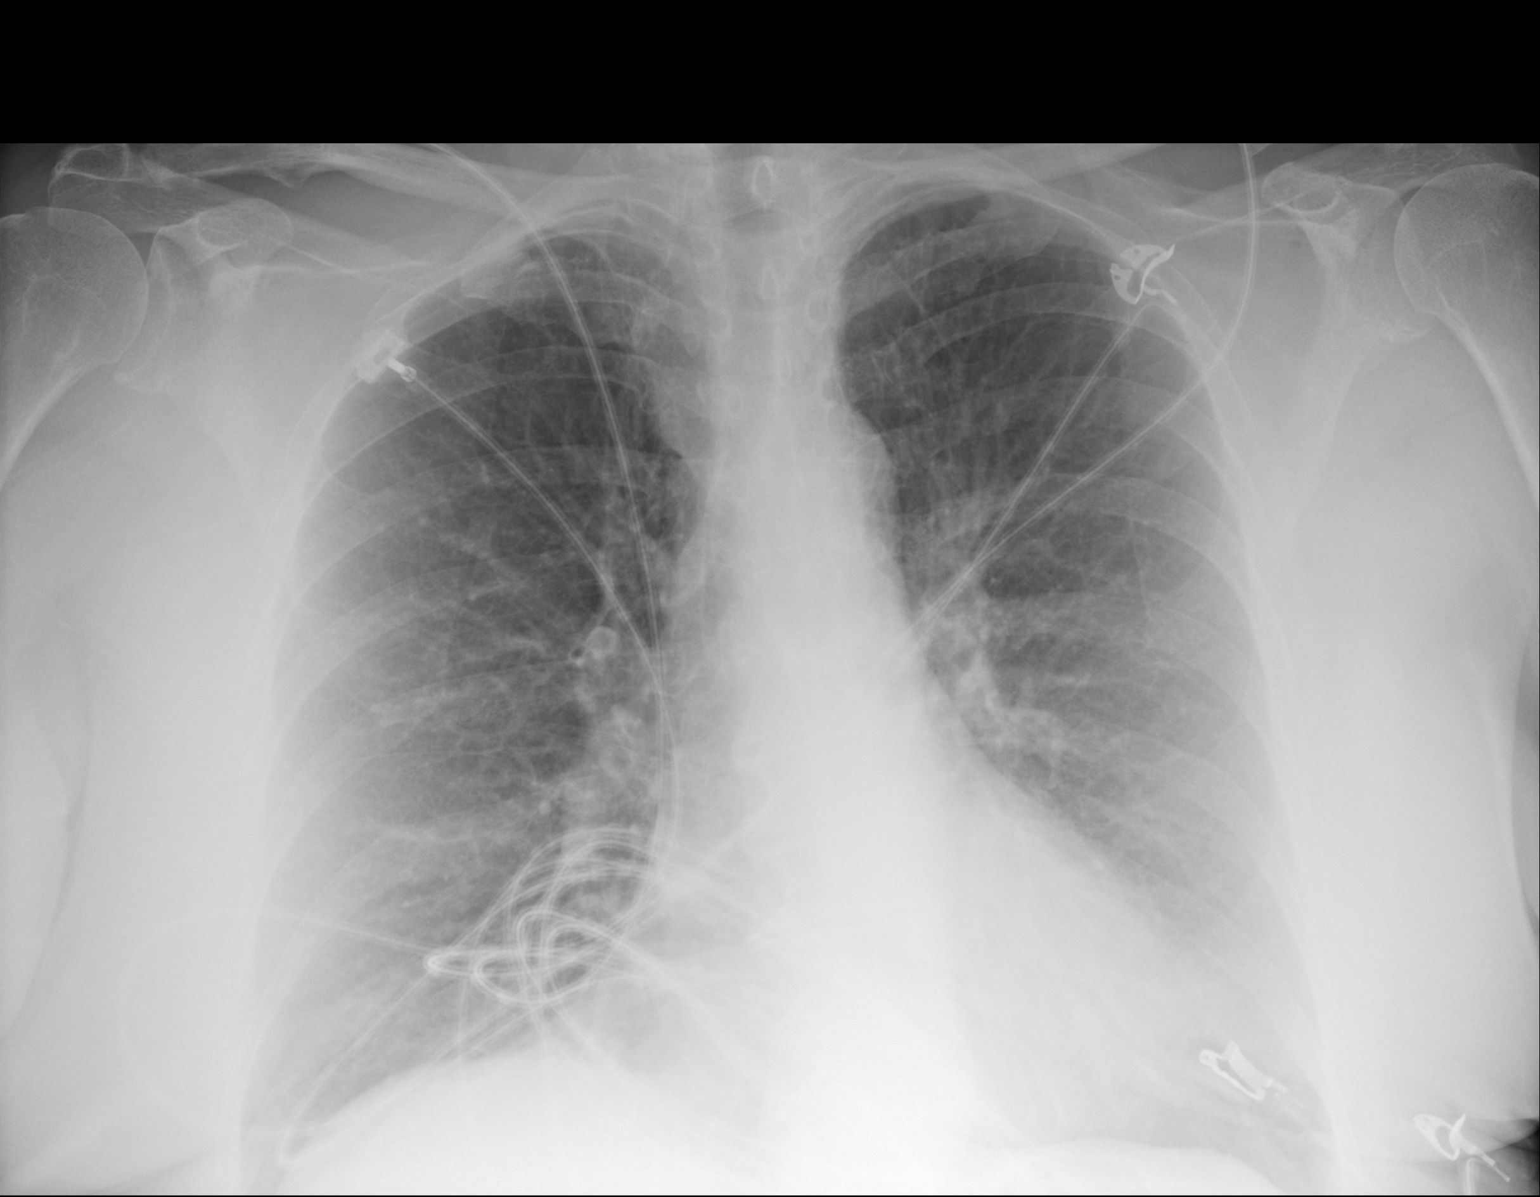

[1 of 1 positions shown; findings below may reference images not displayed]

FINDINGS: Body habitus reduces diagnostic sensitivity and specificity. Mild
cardiomegaly with indistinct pulmonary vasculature. Airway
thickening is present, suggesting bronchitis or reactive airways
disease. Atherosclerotic calcification of the aortic arch. Thoracic
spondylosis.

Degenerative AC joint spurring noted bilaterally.
IMPRESSION: 1. Mild cardiomegaly and possible pulmonary venous hypertension,
without overt edema.
2. Airway thickening is present, suggesting bronchitis or reactive
airways disease.
3. Atherosclerosis.
4. Thoracic spondylosis.

## 2016-05-25 DIAGNOSIS — E669 Obesity, unspecified: Secondary | ICD-10-CM | POA: Diagnosis not present

## 2016-05-25 DIAGNOSIS — Z6833 Body mass index (BMI) 33.0-33.9, adult: Secondary | ICD-10-CM | POA: Diagnosis not present

## 2016-05-25 DIAGNOSIS — M150 Primary generalized (osteo)arthritis: Secondary | ICD-10-CM | POA: Diagnosis not present

## 2016-05-25 DIAGNOSIS — M0609 Rheumatoid arthritis without rheumatoid factor, multiple sites: Secondary | ICD-10-CM | POA: Diagnosis not present

## 2016-06-12 ENCOUNTER — Other Ambulatory Visit: Payer: Self-pay | Admitting: Family Medicine

## 2016-07-03 DIAGNOSIS — M0609 Rheumatoid arthritis without rheumatoid factor, multiple sites: Secondary | ICD-10-CM | POA: Diagnosis not present

## 2016-07-13 ENCOUNTER — Telehealth: Payer: Self-pay | Admitting: Family Medicine

## 2016-07-13 ENCOUNTER — Other Ambulatory Visit: Payer: Self-pay | Admitting: Family Medicine

## 2016-07-13 DIAGNOSIS — E1142 Type 2 diabetes mellitus with diabetic polyneuropathy: Secondary | ICD-10-CM

## 2016-07-13 MED ORDER — METFORMIN HCL 500 MG PO TABS: ORAL_TABLET | ORAL | 0 refills | Status: DC

## 2016-07-13 NOTE — Telephone Encounter (Signed)
Last seen 10/07/15

## 2016-07-13 NOTE — Telephone Encounter (Signed)
What is the name of the medication? Metformin. She lost hers  Have you contacted your pharmacy to request a refill? no  Which pharmacy would you like this sent to[facility] [place]   Patient notified that their request is being sent to the clinical staff for review and that they should receive a call once it is complete. If they do not receive a call within 24 hours they can check with their pharmacy or our office.

## 2016-07-13 NOTE — Telephone Encounter (Signed)
I am okay to give her 1 month's worth but she needs to come in for an appointment.

## 2016-07-13 NOTE — Telephone Encounter (Signed)
Patient aware rx sent  

## 2016-07-27 ENCOUNTER — Other Ambulatory Visit: Payer: Self-pay | Admitting: Family Medicine

## 2016-08-02 ENCOUNTER — Encounter: Payer: Self-pay | Admitting: Family Medicine

## 2016-08-02 ENCOUNTER — Ambulatory Visit (INDEPENDENT_AMBULATORY_CARE_PROVIDER_SITE_OTHER): Payer: PPO | Admitting: Family Medicine

## 2016-08-02 VITALS — BP 132/74 | HR 86 | Temp 98.0°F | Ht 66.0 in | Wt 197.0 lb

## 2016-08-02 DIAGNOSIS — K529 Noninfective gastroenteritis and colitis, unspecified: Secondary | ICD-10-CM | POA: Diagnosis not present

## 2016-08-02 DIAGNOSIS — E1142 Type 2 diabetes mellitus with diabetic polyneuropathy: Secondary | ICD-10-CM | POA: Diagnosis not present

## 2016-08-02 DIAGNOSIS — Z780 Asymptomatic menopausal state: Secondary | ICD-10-CM

## 2016-08-02 DIAGNOSIS — E782 Mixed hyperlipidemia: Secondary | ICD-10-CM | POA: Diagnosis not present

## 2016-08-02 DIAGNOSIS — I10 Essential (primary) hypertension: Secondary | ICD-10-CM | POA: Diagnosis not present

## 2016-08-02 DIAGNOSIS — E039 Hypothyroidism, unspecified: Secondary | ICD-10-CM | POA: Diagnosis not present

## 2016-08-02 LAB — URINALYSIS, COMPLETE
Bilirubin, UA: NEGATIVE
Glucose, UA: NEGATIVE
Ketones, UA: NEGATIVE
Nitrite, UA: NEGATIVE
Protein, UA: NEGATIVE
RBC, UA: NEGATIVE
Specific Gravity, UA: 1.01 (ref 1.005–1.030)
Urobilinogen, Ur: 0.2 mg/dL (ref 0.2–1.0)
pH, UA: 7 (ref 5.0–7.5)

## 2016-08-02 LAB — MICROSCOPIC EXAMINATION
RBC, UA: NONE SEEN /hpf (ref 0–?)
Renal Epithel, UA: NONE SEEN /hpf

## 2016-08-02 LAB — BAYER DCA HB A1C WAIVED: HB A1C (BAYER DCA - WAIVED): 6.2 % (ref <7.0)

## 2016-08-02 LAB — GLUCOSE HEMOCUE WAIVED: GLU HEMOCUE WAIVED: 122 mg/dL — AB (ref 65–99)

## 2016-08-02 MED ORDER — ONDANSETRON 4 MG PO TBDP: 4.0000 mg | ORAL_TABLET | Freq: Three times a day (TID) | ORAL | 0 refills | Status: DC | PRN

## 2016-08-02 MED ORDER — AMLODIPINE BESYLATE 2.5 MG PO TABS: 2.5000 mg | ORAL_TABLET | Freq: Every day | ORAL | 1 refills | Status: DC

## 2016-08-02 MED ORDER — LEVOTHYROXINE SODIUM 75 MCG PO TABS: ORAL_TABLET | ORAL | 1 refills | Status: DC

## 2016-08-02 MED ORDER — AMITRIPTYLINE HCL 100 MG PO TABS: 100.0000 mg | ORAL_TABLET | Freq: Every day | ORAL | 1 refills | Status: DC

## 2016-08-02 MED ORDER — TRAMADOL HCL 50 MG PO TABS: 50.0000 mg | ORAL_TABLET | Freq: Two times a day (BID) | ORAL | 0 refills | Status: DC | PRN

## 2016-08-02 NOTE — Progress Notes (Signed)
BP 132/74   Pulse 86   Temp 98 F (36.7 C) (Oral)   Ht _0  (1.676 m)   Wt 197 lb (89.4 kg)   BMI 31.80 kg/m    Subjective:    Patient ID: Laura Dalton, female    DOB: 1950/10/12, 66 y.o.   MRN: [id number]  HPI: Laura Dalton is a 66 y.o. female presenting on 08/02/2016 for Medication refills (Amitriptyline, Losartan, Tramadol, Levothyroxine) and Nausea and vomiting, dizziness (began last night, felt better this morning when she woke up, but it has returned)   HPI Nausea and vomiting Patient has been having nausea and vomiting since late last night. She denies any diarrhea or blood in her vomit. She has had about 2 episodes of vomiting since last night and then some dry heaving today. She denies any fevers or chills. She does have a little bit of epigastric discomfort and bloating but otherwise no pain anywhere else. She denies any sick contacts that she knows of. She says that her blood sugars have been running in the 160s to 200s.  Type 2 diabetes mellitus Patient comes in today for recheck of his diabetes. Patient has been currently taking metformin 500. Patient is currently on an ACE inhibitor. Patient has not seen an ophthalmologist this year. Patient denies any new issues with his feet. Patient does have some known neuropathy but it's been mostly controlled   Hypertension Patient is currently on amlodipine and losartan-hydrochlorothiazide, and her blood pressure today is 132/74. Patient denies any lightheadedness or dizziness. Patient denies headaches, blurred vision, chest pains, shortness of breath, or weakness. Denies any side effects from medication and is content with current medication.   Hyperlipidemia Patient is coming in for recheck of his hyperlipidemia. He is currently taking atorvastatin 40. He denies any issues with myalgias or history of liver damage from it. He denies any focal numbness or weakness or chest pain.   Hypothyroidism recheck Patient is coming  in for thyroid recheck today as well. He denies any issues with hair changes or heat or cold problems or diarrhea or constipation. He denies any chest pain or palpitations. He is currently on levothyroxine 75 micrograms   Patient is postmenopausal and is due for a DEXA scan screening.  Relevant past medical, surgical, family and social history reviewed and updated as indicated. Interim medical history since our last visit reviewed. Allergies and medications reviewed and updated.  Review of Systems  Constitutional: Negative for chills and fever.  HENT: Negative for congestion, ear discharge and ear pain.   Eyes: Negative for redness and visual disturbance.  Respiratory: Negative for chest tightness and shortness of breath.   Cardiovascular: Negative for chest pain and leg swelling.  Gastrointestinal: Positive for abdominal distention, abdominal pain, nausea and vomiting. Negative for constipation and diarrhea.  Genitourinary: Negative for difficulty urinating and dysuria.  Musculoskeletal: Negative for back pain and gait problem.  Skin: Negative for rash.  Neurological: Negative for light-headedness and headaches.  Psychiatric/Behavioral: Negative for agitation and behavioral problems.  All other systems reviewed and are negative.   Per HPI unless specifically indicated above        Objective:    BP 132/74   Pulse 86   Temp 98 F (36.7 C) (Oral)   Ht _1  (1.676 m)   Wt 197 lb (89.4 kg)   BMI 31.80 kg/m   Wt Readings from Last 3 Encounters:  08/02/16 197 lb (89.4 kg)  10/07/15 197 lb 6.4 oz (89.5  kg)  09/23/15 198 lb (89.8 kg)    Physical Exam  Constitutional: She is oriented to person, place, and time. She appears well-developed and well-nourished. No distress.  Eyes: Conjunctivae are normal.  Neck: Neck supple. No thyromegaly present.  Cardiovascular: Normal rate, regular rhythm, normal heart sounds and intact distal pulses.   No murmur heard. Pulmonary/Chest:  Effort normal and breath sounds normal. No respiratory distress. She has no wheezes.  Abdominal: Soft. Bowel sounds are normal. She exhibits no distension. There is tenderness in the epigastric area. There is no rebound and no guarding.  Musculoskeletal: Normal range of motion. She exhibits no edema or tenderness.  Lymphadenopathy:    She has no cervical adenopathy.  Neurological: She is alert and oriented to person, place, and time. Coordination normal.  Skin: Skin is warm and dry. No rash noted. She is not diaphoretic.  Psychiatric: She has a normal mood and affect. Her behavior is normal.  Nursing note and vitals reviewed.     Assessment & Plan:   Problem List Items Addressed This Visit      Cardiovascular and Mediastinum   HTN (hypertension) - Primary   Relevant Medications   amLODipine (NORVASC) 2.5 MG tablet   Other Relevant Orders   CMP14+EGFR (Completed)     Endocrine   DM (diabetes mellitus) ([place])   Relevant Orders   Bayer DCA Hb A1c Waived (Completed)   CMP14+EGFR (Completed)   Diabetic peripheral neuropathy associated with type 2 diabetes mellitus (HCC)   Relevant Medications   amitriptyline (ELAVIL) 100 MG tablet   Other Relevant Orders   Bayer DCA Hb A1c Waived (Completed)   CMP14+EGFR (Completed)   Urinalysis, Complete (Completed)   Glucose Hemocue Waived (Completed)   Hypothyroidism   Relevant Medications   levothyroxine (SYNTHROID, LEVOTHROID) 75 MCG tablet   levothyroxine (SYNTHROID, LEVOTHROID) 88 MCG tablet   Other Relevant Orders   TSH (Completed)     Other   HLD (hyperlipidemia)   Relevant Medications   amLODipine (NORVASC) 2.5 MG tablet   Other Relevant Orders   Lipid panel (Completed)    Other Visit Diagnoses    Postmenopausal       Relevant Orders   DG Bone Density   Gastroenteritis       Relevant Orders   Urinalysis, Complete (Completed)   Glucose Hemocue Waived (Completed)       Follow up plan: Return if symptoms worsen or fail to  improve.  Counseling provided for all of the vaccine components Orders Placed This Encounter  Procedures  . DG Bone Density  . Bayer DCA Hb A1c Waived  . CMP14+EGFR  . Lipid panel  . TSH  . Urinalysis, Complete  . Glucose Hemocue [place] Zilpha Mcandrew, MD [facility] 08/02/2016, 11:06 AM

## 2016-08-03 ENCOUNTER — Telehealth: Payer: Self-pay

## 2016-08-03 LAB — CMP14+EGFR
A/G RATIO: 1.8 (ref 1.2–2.2)
ALK PHOS: 80 IU/L (ref 39–117)
ALT: 11 IU/L (ref 0–32)
AST: 19 IU/L (ref 0–40)
Albumin: 4.7 g/dL (ref 3.6–4.8)
BILIRUBIN TOTAL: 0.4 mg/dL (ref 0.0–1.2)
BUN/Creatinine Ratio: 17 (ref 12–28)
BUN: 11 mg/dL (ref 8–27)
CALCIUM: 10.3 mg/dL (ref 8.7–10.3)
CHLORIDE: 94 mmol/L — AB (ref 96–106)
CO2: 28 mmol/L (ref 18–29)
Creatinine, Ser: 0.65 mg/dL (ref 0.57–1.00)
GFR calc Af Amer: 107 mL/min/{1.73_m2} (ref >59)
GFR calc non Af Amer: 93 mL/min/{1.73_m2} (ref >59)
Globulin, Total: 2.6 g/dL (ref 1.5–4.5)
Glucose: 103 mg/dL — ABNORMAL HIGH (ref 65–99)
POTASSIUM: 3.9 mmol/L (ref 3.5–5.2)
SODIUM: 140 mmol/L (ref 134–144)
Total Protein: 7.3 g/dL (ref 6.0–8.5)

## 2016-08-03 LAB — LIPID PANEL
CHOL/HDL RATIO: 3 ratio (ref 0.0–4.4)
Cholesterol, Total: 118 mg/dL (ref 100–199)
HDL: 40 mg/dL (ref >39)
LDL Calculated: 51 mg/dL (ref 0–99)
TRIGLYCERIDES: 133 mg/dL (ref 0–149)
VLDL CHOLESTEROL CAL: 27 mg/dL (ref 5–40)

## 2016-08-03 LAB — TSH: TSH: 7.17 u[IU]/mL — ABNORMAL HIGH (ref 0.450–4.500)

## 2016-08-03 MED ORDER — LEVOTHYROXINE SODIUM 88 MCG PO TABS: 88.0000 ug | ORAL_TABLET | Freq: Every day | ORAL | 1 refills | Status: DC

## 2016-08-03 NOTE — Telephone Encounter (Signed)
I don't even know what to make of this, what do they want us to send her?

## 2016-08-07 ENCOUNTER — Other Ambulatory Visit: Payer: Self-pay | Admitting: Family

## 2016-08-07 DIAGNOSIS — I10 Essential (primary) hypertension: Secondary | ICD-10-CM

## 2016-08-13 ENCOUNTER — Other Ambulatory Visit: Payer: Self-pay | Admitting: Family Medicine

## 2016-08-13 ENCOUNTER — Other Ambulatory Visit: Payer: Self-pay | Admitting: Family

## 2016-08-13 DIAGNOSIS — E785 Hyperlipidemia, unspecified: Secondary | ICD-10-CM

## 2016-08-14 NOTE — Telephone Encounter (Signed)
Filled 08/02/16 for 2 week supply

## 2016-08-14 NOTE — Telephone Encounter (Signed)
Go ahead and print tramadol but her know the future that she will always have to get this refilled in a visit.

## 2016-08-16 NOTE — Telephone Encounter (Signed)
Rx at front desk ready for pick up.

## 2016-08-31 ENCOUNTER — Ambulatory Visit (INDEPENDENT_AMBULATORY_CARE_PROVIDER_SITE_OTHER): Payer: PPO | Admitting: *Deleted

## 2016-08-31 VITALS — BP 107/67 | HR 69 | Temp 98.3°F | Ht 66.0 in | Wt 199.0 lb

## 2016-08-31 DIAGNOSIS — Z0000 Encounter for general adult medical examination without abnormal findings: Secondary | ICD-10-CM

## 2016-08-31 DIAGNOSIS — Z1211 Encounter for screening for malignant neoplasm of colon: Secondary | ICD-10-CM

## 2016-08-31 NOTE — Progress Notes (Signed)
Subjective:   Laura Dalton Kutscher is a 66 y.o. female who presents for an Initial Medicare Annual Wellness Visit.  Review of Systems    Laura Dalton Service is here today for her Initial Medicare Annual Wellness Visit. 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 She enjoys painting, sewing and gardening. She does not exercise. She states her diet is semi-healthy. She is involved in her church. She lives in [place] with her grandson. She is widowed and has 2 sons and 1 daughter. She does not have any pets. Fall hazards were discussed with patient as she does have some steps going into her shop that she has to use. Patient states that she feels her health is the same as it was a year ago.         Objective:    Today's Vitals   08/31/16 1208  BP: 107/67  Pulse: 69  Temp: 98.3 F (36.8 C)  TempSrc: Oral  Weight: 199 lb (90.3 kg)  Height: 5\' 6"  (1.676 m)   Body mass index is 32.12 kg/m.   Current Medications (verified) Outpatient Encounter Prescriptions as of 08/31/2016  Medication Sig  . acetaminophen (QC ACETAMINOPHEN 8 HOURS) 650 MG CR tablet Take 650 mg by mouth every 8 (eight) hours as needed for pain.  Marland Kitchen. amitriptyline (ELAVIL) 100 MG tablet Take 1 tablet (100 mg total) by mouth daily with breakfast.  . amLODipine (NORVASC) 2.5 MG tablet Take 1 tablet (2.5 mg total) by mouth daily.  Marland Kitchen. atorvastatin (LIPITOR) 40 MG tablet TAKE 1 TABLET BY MOUTH ONCE DAILY  . clopidogrel (PLAVIX) 75 MG tablet TAKE 1 TABLET BY MOUTH ONCE DAILY  . folic acid (FOLVITE) 1 MG tablet Take 1 mg by mouth daily.  Marland Kitchen. gabapentin (NEURONTIN) 600 MG tablet TAKE ONE TABLET BY MOUTH THREE TIMES DAILY  . levothyroxine (SYNTHROID, LEVOTHROID) 88 MCG tablet Take 1 tablet (88 mcg total) by mouth daily.  Marland Kitchen. losartan-hydrochlorothiazide (HYZAAR) 100-25 MG tablet TAKE 1 TABLET BY MOUTH ONCE DAILY  .  meloxicam (MOBIC) 15 MG tablet TAKE ONE TABLET BY MOUTH ONCE DAILY  . metFORMIN (GLUCOPHAGE) 500 MG tablet TAKE ONE TABLET BY MOUTh once DAILY  . Multiple Vitamins-Minerals (PRESERVISION AREDS 2) CAPS Take by mouth.  . traMADol (ULTRAM) 50 MG tablet TAKE 1 TABLET BY MOUTH EVERY 12 HOURS AS NEEDED  . [DISCONTINUED] levothyroxine (SYNTHROID, LEVOTHROID) 75 MCG tablet TAKE ONE TABLET BY MOUTH ONCE DAILY BEFORE BREAKFAST  . [DISCONTINUED] meloxicam (MOBIC) 7.5 MG tablet Take 7.5 mg by mouth daily. Reported on 09/23/2015  . [DISCONTINUED] ondansetron (ZOFRAN ODT) 4 MG disintegrating tablet Take 1 tablet (4 mg total) by mouth every 8 (eight) hours as needed for nausea or vomiting.   No facility-administered encounter medications on file as of 08/31/2016.     Allergies (verified) Aspirin   History: Past Medical History:  Diagnosis Date  . Bursitis of hip   . Diabetes mellitus without complication (HCC)    type II  . Hypertension   . Hypothyroidism   . Low back pain    benign turmor back  . Stroke (HCC)   . Vitamin D deficiency disease    Past Surgical History:  Procedure Laterality Date  . APPENDECTOMY    . BACK SURGERY  1996   turmor on back   . CHOLECYSTECTOMY    . LEG SURGERY  Right    "clot and had drain in leg  rt lower leg   Family History  Problem Relation Age of Onset  . Diabetes Mother   . Cancer Father   . Thyroid disease Son   . Hashimoto's thyroiditis Son    Social History   Occupational History  . 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 Smoking status: Former Smoker    Packs/day: 0.50    Years: 45.00    Types: Cigarettes    Start date: 08/15/1962    Quit date: 12/01/2007  . Smokeless tobacco: Never Used  . Alcohol use No  . Drug use: No  . Sexual activity: No    Tobacco Counseling Counseling given: Not Answered Patient quit smoking in 2009  Activities of Daily Living In your present state of health, do you have any difficulty  performing the following activities: 08/31/2016  Hearing? N  Vision? Y  Difficulty concentrating or making decisions? N  Walking or climbing stairs? Y  Dressing or bathing? N  Doing errands, shopping? N  Some recent data might be hidden  Patient wears glasses Patient has difficulty climbing stairs due to her back pain.   Immunizations and Health Maintenance Immunization History  Administered Date(s) Administered  . Influenza Whole 03/13/2010  . Tdap 03/13/2010  Patient refused all immunizations that were recommended at this time Health Maintenance Due  Topic Date Due  . OPHTHALMOLOGY EXAM  08/15/1959  . DEXA SCAN  08/15/2014  . FOOT EXAM  05/01/2015  . COLON CANCER SCREENING ANNUAL FOBT  05/09/2015  . MAMMOGRAM  05/08/2016   Patient was given a FOBT to return  Mammogram appointment scheduled for 10/09/2016 Our office will contact patient to schedule Dexa Scan.  We will call and get patients last eye exam note.  Patient Care Team: Dettinger, Elige Radon, MD as PCP - General (Family Medicine) Donnetta Hail, MD as Consulting Physician (Rheumatology)  Indicate any recent Medical Services you may have received from other than [facility] providers in the past year (date may be approximate).     Assessment:   This is a routine wellness examination for [place].   Hearing/Vision screen No exam data present Patient has no complaints with her hearing and she does wear glasses.  Dietary issues and exercise activities discussed:    Goals    . Exercise 3x per week (30 min per time)    . Have 3 meals a day      Depression Screen PHQ 2/9 Scores 08/31/2016 08/02/2016 10/07/2015 09/23/2015 09/06/2015 07/02/2015 04/30/2015  PHQ - 2 Score 0 1 0 0 0 0 0    Fall Risk Fall Risk  08/31/2016 10/07/2015 09/23/2015 09/06/2015 07/02/2015  Falls in the past year? No No No No No    Cognitive Function: MMSE - Mini Mental State Exam 08/31/2016  Orientation to time 5  Orientation to Place 5  Registration 3    Attention/ Calculation 5  Recall 3  Language- name 2 objects 2  Language- repeat 1  Language- follow 3 step command 3  Language- read & follow direction 1  Write a sentence 1  Copy design 1  Total score 30  Patient scored a 30 out of 30.       Screening Tests Health Maintenance  Topic Date Due  . OPHTHALMOLOGY EXAM  08/15/1959  . DEXA SCAN  08/15/2014  . FOOT EXAM  05/01/2015  . COLON CANCER SCREENING ANNUAL FOBT  05/09/2015  .  MAMMOGRAM  05/08/2016  . PNA vac Low Risk Adult (1 of 2 - PCV13) 02/02/2017 (Originally 08/15/2014)  . INFLUENZA VACCINE  10/11/2016  . HEMOGLOBIN A1C  02/02/2017  . TETANUS/TDAP  03/13/2020  . Hepatitis C Screening  Completed      Plan:   Patient will return FOBT as soon as it is completed Patient scheduled for mammogram on 10/09/2016. Patient will be contacted by our office to schedule a Dexa scan as our machine is down today.  We will contact Dr. Norris Cross office to get her last eye exam visit.   I have personally reviewed and noted the following in the patient's chart:   . Medical and social history . Use of alcohol, tobacco or illicit drugs  . Current medications and supplements . Functional ability and status . Nutritional status . Physical activity . Advanced directives . List of other physicians . Hospitalizations, surgeries, and ER visits in previous 12 months . Vitals . Screenings to include cognitive, depression, and falls . Referrals and appointments  In addition, I have reviewed and discussed with patient certain preventive protocols, quality metrics, and best practice recommendations. A written personalized care plan for preventive services as well as general preventive health recommendations were provided to patient.     Sherron Monday, LPN   1/61/0960    I have reviewed and agree with the above AWV documentation.   Arville Care, MD [facility] 08/31/2016, 1:12 PM

## 2016-08-31 NOTE — Patient Instructions (Addendum)
  [name] , Thank you for taking time to come for your Medicare Wellness Visit. I appreciate your ongoing commitment to your health goals. Please review the following plan we discussed and let me know if I can assist you in the future.   These are the goals we discussed: Goals    . Exercise 3x per week (30 min per time)    . Have 3 meals a day       This is a list of the screening recommended for you and due dates:  Health Maintenance  Topic Date Due  . Eye exam for diabetics  08/15/1959  . DEXA scan (bone density measurement)  08/15/2014  . Complete foot exam   05/01/2015  . Stool Blood Test  05/09/2015  . Mammogram  05/08/2016  . Pneumonia vaccines (1 of 2 - PCV13) 02/02/2017*  . Flu Shot  10/11/2016  . Hemoglobin A1C  02/02/2017  . Tetanus Vaccine  03/13/2020  .  Hepatitis C: One time screening is recommended by Center for Disease Control  (CDC) for  adults born from [birth year] through 1966.   Completed  *Topic was postponed. The date shown is not the original due date.    We will call you to schedule a Dexa Scan appointment Please return FOBT as soon as it is completed Please review Advance Directive and bring us a copy if you decide to fill out

## 2016-09-08 ENCOUNTER — Other Ambulatory Visit: Payer: Self-pay | Admitting: Family Medicine

## 2016-09-08 DIAGNOSIS — E1142 Type 2 diabetes mellitus with diabetic polyneuropathy: Secondary | ICD-10-CM

## 2016-09-21 ENCOUNTER — Ambulatory Visit (INDEPENDENT_AMBULATORY_CARE_PROVIDER_SITE_OTHER): Payer: PPO

## 2016-09-21 ENCOUNTER — Other Ambulatory Visit: Payer: PPO

## 2016-09-21 DIAGNOSIS — Z1211 Encounter for screening for malignant neoplasm of colon: Secondary | ICD-10-CM

## 2016-09-21 DIAGNOSIS — Z780 Asymptomatic menopausal state: Secondary | ICD-10-CM

## 2016-09-23 LAB — FECAL OCCULT BLOOD, IMMUNOCHEMICAL: Fecal Occult Bld: POSITIVE — AB

## 2016-09-25 ENCOUNTER — Other Ambulatory Visit: Payer: Self-pay | Admitting: *Deleted

## 2016-09-25 DIAGNOSIS — R195 Other fecal abnormalities: Secondary | ICD-10-CM

## 2016-09-27 ENCOUNTER — Encounter (INDEPENDENT_AMBULATORY_CARE_PROVIDER_SITE_OTHER): Payer: Self-pay | Admitting: Internal Medicine

## 2016-10-08 ENCOUNTER — Other Ambulatory Visit: Payer: Self-pay | Admitting: Family Medicine

## 2016-10-08 DIAGNOSIS — G8929 Other chronic pain: Secondary | ICD-10-CM

## 2016-10-08 DIAGNOSIS — M549 Dorsalgia, unspecified: Principal | ICD-10-CM

## 2016-10-09 ENCOUNTER — Encounter: Payer: PPO | Admitting: *Deleted

## 2016-10-09 DIAGNOSIS — Z1231 Encounter for screening mammogram for malignant neoplasm of breast: Secondary | ICD-10-CM | POA: Diagnosis not present

## 2016-10-10 ENCOUNTER — Encounter (INDEPENDENT_AMBULATORY_CARE_PROVIDER_SITE_OTHER): Payer: Self-pay | Admitting: Internal Medicine

## 2016-10-10 ENCOUNTER — Ambulatory Visit (INDEPENDENT_AMBULATORY_CARE_PROVIDER_SITE_OTHER): Payer: PPO | Admitting: Internal Medicine

## 2016-10-10 VITALS — BP 146/74 | HR 64 | Temp 98.9°F | Ht 66.0 in | Wt 196.9 lb

## 2016-10-10 DIAGNOSIS — R195 Other fecal abnormalities: Secondary | ICD-10-CM

## 2016-10-10 NOTE — Patient Instructions (Signed)
CBC

## 2016-10-10 NOTE — Progress Notes (Signed)
Subjective:    Patient ID: Laura Dalton, female    DOB: 07/23/1950, 66 y.o.   MRN: [id number]  HPI Referred by Dr. Louanne Skyeettinger for +stool card. She tells me she had to strain to have a BM.  She says she has hemorrhoids. She has never had a colonoscopy in the past. No change in her stools. Appetite is good. No weight loss. No abdominal pain. No family hx of colon cancer.   Hx of CVA and maintained on Plavix.   CBC    Component Value Date/Time   WBC 16.6 (H) 08/01/2015 2223   RBC 4.44 08/01/2015 2223   HGB 12.3 08/01/2015 2223   HCT 37.9 08/01/2015 2223   PLT 247 08/01/2015 2223   MCV 85.4 08/01/2015 2223   MCV 85.7 09/17/2013 1247   MCH 27.7 08/01/2015 2223   MCHC 32.5 08/01/2015 2223   RDW 13.3 08/01/2015 2223   LYMPHSABS 1.7 08/01/2015 1220   MONOABS 1.9 (H) 08/01/2015 1220   EOSABS 0.4 08/01/2015 1220   BASOSABS 0.0 08/01/2015 1220     Review of Systems Past Medical History:  Diagnosis Date  . Bursitis of hip   . Diabetes mellitus without complication (HCC)    type II  . Hypertension   . Hypothyroidism   . Low back pain    benign turmor back  . Stroke (HCC)   . Vitamin D deficiency disease     Past Surgical History:  Procedure Laterality Date  . APPENDECTOMY    . BACK SURGERY  1996   turmor on back   . CHOLECYSTECTOMY    . LEG SURGERY Right    "clot and had drain in leg  rt lower leg    Allergies  Allergen Reactions  . Aspirin Hives    Current Outpatient Prescriptions on File Prior to Visit  Medication Sig Dispense Refill  . amitriptyline (ELAVIL) 100 MG tablet Take 1 tablet (100 mg total) by mouth daily with breakfast. (Patient taking differently: Take 100 mg by mouth at bedtime. ) 90 tablet 1  . amLODipine (NORVASC) 2.5 MG tablet Take 1 tablet (2.5 mg total) by mouth daily. 90 tablet 1  . atorvastatin (LIPITOR) 40 MG tablet TAKE 1 TABLET BY MOUTH ONCE DAILY 90 tablet 1  . clopidogrel (PLAVIX) 75 MG tablet TAKE 1 TABLET BY MOUTH ONCE DAILY 30  tablet 2  . gabapentin (NEURONTIN) 600 MG tablet TAKE ONE TABLET BY MOUTH THREE TIMES DAILY 270 tablet 0  . levothyroxine (SYNTHROID, LEVOTHROID) 88 MCG tablet Take 1 tablet (88 mcg total) by mouth daily. 90 tablet 1  . losartan-hydrochlorothiazide (HYZAAR) 100-25 MG tablet TAKE 1 TABLET BY MOUTH ONCE DAILY 90 tablet 1  . meloxicam (MOBIC) 15 MG tablet TAKE ONE TABLET BY MOUTH ONCE DAILY 90 tablet 0  . methocarbamol (ROBAXIN) 500 MG tablet TAKE ONE TABLET BY MOUTH EVERY 6 HOURS AS NEEDED FOR MUSCLE SPASM 60 tablet 2  . Multiple Vitamins-Minerals (PRESERVISION AREDS 2) CAPS Take by mouth.    . traMADol (ULTRAM) 50 MG tablet TAKE 1 TABLET BY MOUTH EVERY 12 HOURS AS NEEDED 30 tablet 0  . metFORMIN (GLUCOPHAGE) 500 MG tablet TAKE 1 TABLET BY MOUTH ONCE DAILY 30 tablet 4   No current facility-administered medications on file prior to visit.         Objective:   Physical Exam Blood pressure (!) 146/74, pulse 64, temperature 98.9 F (37.2 C), height 5\' 6"  (1.676 m), weight 196 lb 14.4 oz (89.3 kg). Alert and  oriented. Skin warm and dry. Oral mucosa is moist.   . Sclera anicteric, conjunctivae is pink. Thyroid not enlarged. No cervical lymphadenopathy. Lungs clear. Heart regular rate and rhythm.  Abdomen is soft. Bowel sounds are positive. No hepatomegaly. No abdominal masses felt. No tenderness.  No edema to lower extremities.            Assessment & Plan:  Guaiac + stool. Colonic neoplasm needs to be ruled out.   Patient has declined a colonoscopy at this time. Will get a CBC today

## 2016-10-11 LAB — CBC WITH DIFFERENTIAL/PLATELET
BASOS ABS: 0 {cells}/uL (ref 0–200)
Basophils Relative: 0 %
EOS PCT: 5 %
Eosinophils Absolute: 470 cells/uL (ref 15–500)
HCT: 38.1 % (ref 35.0–45.0)
HEMOGLOBIN: 12.6 g/dL (ref 11.7–15.5)
LYMPHS PCT: 15 %
Lymphs Abs: 1410 cells/uL (ref 850–3900)
MCH: 28.1 pg (ref 27.0–33.0)
MCHC: 33.1 g/dL (ref 32.0–36.0)
MCV: 84.9 fL (ref 80.0–100.0)
MONOS PCT: 10 %
MPV: 10.2 fL (ref 7.5–12.5)
Monocytes Absolute: 940 cells/uL (ref 200–950)
NEUTROS PCT: 70 %
Neutro Abs: 6580 cells/uL (ref 1500–7800)
PLATELETS: 294 10*3/uL (ref 140–400)
RBC: 4.49 MIL/uL (ref 3.80–5.10)
RDW: 13.5 % (ref 11.0–15.0)
WBC: 9.4 10*3/uL (ref 3.8–10.8)

## 2016-11-22 ENCOUNTER — Other Ambulatory Visit: Payer: Self-pay | Admitting: Family Medicine

## 2016-12-21 ENCOUNTER — Other Ambulatory Visit: Payer: Self-pay | Admitting: Family Medicine

## 2016-12-21 ENCOUNTER — Other Ambulatory Visit: Payer: Self-pay | Admitting: Family

## 2017-01-04 DIAGNOSIS — Z83511 Family history of glaucoma: Secondary | ICD-10-CM | POA: Diagnosis not present

## 2017-01-04 DIAGNOSIS — H35463 Secondary vitreoretinal degeneration, bilateral: Secondary | ICD-10-CM | POA: Diagnosis not present

## 2017-01-04 DIAGNOSIS — H1851 Endothelial corneal dystrophy: Secondary | ICD-10-CM | POA: Diagnosis not present

## 2017-01-04 DIAGNOSIS — H04123 Dry eye syndrome of bilateral lacrimal glands: Secondary | ICD-10-CM | POA: Diagnosis not present

## 2017-01-04 DIAGNOSIS — H40023 Open angle with borderline findings, high risk, bilateral: Secondary | ICD-10-CM | POA: Diagnosis not present

## 2017-01-04 DIAGNOSIS — H353132 Nonexudative age-related macular degeneration, bilateral, intermediate dry stage: Secondary | ICD-10-CM | POA: Diagnosis not present

## 2017-01-04 DIAGNOSIS — E119 Type 2 diabetes mellitus without complications: Secondary | ICD-10-CM | POA: Diagnosis not present

## 2017-01-04 DIAGNOSIS — H524 Presbyopia: Secondary | ICD-10-CM | POA: Diagnosis not present

## 2017-01-04 DIAGNOSIS — H35033 Hypertensive retinopathy, bilateral: Secondary | ICD-10-CM | POA: Diagnosis not present

## 2017-01-04 DIAGNOSIS — H16223 Keratoconjunctivitis sicca, not specified as Sjogren's, bilateral: Secondary | ICD-10-CM | POA: Diagnosis not present

## 2017-01-04 DIAGNOSIS — H2513 Age-related nuclear cataract, bilateral: Secondary | ICD-10-CM | POA: Diagnosis not present

## 2017-01-04 DIAGNOSIS — Z7984 Long term (current) use of oral hypoglycemic drugs: Secondary | ICD-10-CM | POA: Diagnosis not present

## 2017-01-04 LAB — HM DIABETES EYE EXAM

## 2017-01-05 ENCOUNTER — Other Ambulatory Visit: Payer: Self-pay | Admitting: *Deleted

## 2017-01-05 DIAGNOSIS — E1142 Type 2 diabetes mellitus with diabetic polyneuropathy: Secondary | ICD-10-CM

## 2017-01-05 MED ORDER — METFORMIN HCL 500 MG PO TABS: 500.0000 mg | ORAL_TABLET | Freq: Every day | ORAL | 0 refills | Status: DC

## 2017-02-05 ENCOUNTER — Other Ambulatory Visit: Payer: Self-pay | Admitting: Family Medicine

## 2017-02-05 DIAGNOSIS — I10 Essential (primary) hypertension: Secondary | ICD-10-CM

## 2017-02-07 ENCOUNTER — Other Ambulatory Visit: Payer: Self-pay | Admitting: Family Medicine

## 2017-02-07 DIAGNOSIS — E1142 Type 2 diabetes mellitus with diabetic polyneuropathy: Secondary | ICD-10-CM

## 2017-02-20 ENCOUNTER — Other Ambulatory Visit: Payer: Self-pay | Admitting: *Deleted

## 2017-02-20 DIAGNOSIS — E1142 Type 2 diabetes mellitus with diabetic polyneuropathy: Secondary | ICD-10-CM

## 2017-02-20 MED ORDER — METFORMIN HCL 500 MG PO TABS: 500.0000 mg | ORAL_TABLET | Freq: Every day | ORAL | 0 refills | Status: DC

## 2017-02-28 ENCOUNTER — Other Ambulatory Visit: Payer: Self-pay | Admitting: Family Medicine

## 2017-03-05 ENCOUNTER — Other Ambulatory Visit: Payer: Self-pay | Admitting: Family Medicine

## 2017-03-08 ENCOUNTER — Other Ambulatory Visit: Payer: Self-pay | Admitting: Family Medicine

## 2017-03-08 DIAGNOSIS — E1142 Type 2 diabetes mellitus with diabetic polyneuropathy: Secondary | ICD-10-CM

## 2017-04-15 ENCOUNTER — Other Ambulatory Visit: Payer: Self-pay | Admitting: Family Medicine

## 2017-04-15 DIAGNOSIS — E785 Hyperlipidemia, unspecified: Secondary | ICD-10-CM

## 2017-04-18 ENCOUNTER — Other Ambulatory Visit: Payer: Self-pay | Admitting: Family Medicine

## 2017-04-18 DIAGNOSIS — E785 Hyperlipidemia, unspecified: Secondary | ICD-10-CM

## 2017-05-03 ENCOUNTER — Encounter: Payer: Self-pay | Admitting: Family Medicine

## 2017-05-03 ENCOUNTER — Ambulatory Visit (INDEPENDENT_AMBULATORY_CARE_PROVIDER_SITE_OTHER): Payer: PPO | Admitting: Family Medicine

## 2017-05-03 VITALS — BP 135/82 | HR 72 | Temp 98.3°F | Ht 66.0 in | Wt 202.0 lb

## 2017-05-03 DIAGNOSIS — E1159 Type 2 diabetes mellitus with other circulatory complications: Secondary | ICD-10-CM | POA: Diagnosis not present

## 2017-05-03 DIAGNOSIS — E1169 Type 2 diabetes mellitus with other specified complication: Secondary | ICD-10-CM | POA: Diagnosis not present

## 2017-05-03 DIAGNOSIS — E1142 Type 2 diabetes mellitus with diabetic polyneuropathy: Secondary | ICD-10-CM

## 2017-05-03 DIAGNOSIS — M40209 Unspecified kyphosis, site unspecified: Secondary | ICD-10-CM | POA: Diagnosis not present

## 2017-05-03 DIAGNOSIS — E785 Hyperlipidemia, unspecified: Secondary | ICD-10-CM

## 2017-05-03 DIAGNOSIS — G8929 Other chronic pain: Secondary | ICD-10-CM

## 2017-05-03 DIAGNOSIS — J449 Chronic obstructive pulmonary disease, unspecified: Secondary | ICD-10-CM

## 2017-05-03 DIAGNOSIS — M549 Dorsalgia, unspecified: Secondary | ICD-10-CM | POA: Diagnosis not present

## 2017-05-03 DIAGNOSIS — E039 Hypothyroidism, unspecified: Secondary | ICD-10-CM

## 2017-05-03 DIAGNOSIS — I10 Essential (primary) hypertension: Secondary | ICD-10-CM

## 2017-05-03 DIAGNOSIS — M546 Pain in thoracic spine: Secondary | ICD-10-CM

## 2017-05-03 DIAGNOSIS — E669 Obesity, unspecified: Secondary | ICD-10-CM | POA: Diagnosis not present

## 2017-05-03 LAB — BAYER DCA HB A1C WAIVED: HB A1C: 6.1 % (ref <7.0)

## 2017-05-03 MED ORDER — MELOXICAM 15 MG PO TABS: 15.0000 mg | ORAL_TABLET | Freq: Every day | ORAL | 0 refills | Status: DC

## 2017-05-03 MED ORDER — AMLODIPINE BESYLATE 2.5 MG PO TABS: 2.5000 mg | ORAL_TABLET | Freq: Every day | ORAL | 0 refills | Status: DC

## 2017-05-03 MED ORDER — ATORVASTATIN CALCIUM 40 MG PO TABS: 40.0000 mg | ORAL_TABLET | Freq: Every day | ORAL | 1 refills | Status: DC

## 2017-05-03 MED ORDER — CLOPIDOGREL BISULFATE 75 MG PO TABS: 75.0000 mg | ORAL_TABLET | Freq: Every day | ORAL | 0 refills | Status: DC

## 2017-05-03 MED ORDER — AMITRIPTYLINE HCL 100 MG PO TABS: ORAL_TABLET | ORAL | 0 refills | Status: DC

## 2017-05-03 MED ORDER — LEVOTHYROXINE SODIUM 88 MCG PO TABS: 88.0000 ug | ORAL_TABLET | Freq: Every day | ORAL | 1 refills | Status: DC

## 2017-05-03 MED ORDER — NYSTATIN-TRIAMCINOLONE 100000-0.1 UNIT/GM-% EX OINT: 1.0000 "application " | TOPICAL_OINTMENT | Freq: Two times a day (BID) | CUTANEOUS | 2 refills | Status: DC

## 2017-05-03 MED ORDER — LOSARTAN POTASSIUM-HCTZ 100-25 MG PO TABS: 1.0000 | ORAL_TABLET | Freq: Every day | ORAL | 0 refills | Status: DC

## 2017-05-03 MED ORDER — METFORMIN HCL 500 MG PO TABS: 500.0000 mg | ORAL_TABLET | Freq: Every day | ORAL | 0 refills | Status: DC

## 2017-05-03 MED ORDER — GABAPENTIN 600 MG PO TABS: 600.0000 mg | ORAL_TABLET | Freq: Three times a day (TID) | ORAL | 0 refills | Status: DC

## 2017-05-03 MED ORDER — METHOCARBAMOL 500 MG PO TABS: ORAL_TABLET | ORAL | 2 refills | Status: DC

## 2017-05-03 NOTE — Progress Notes (Signed)
BP 135/82   Pulse 72   Temp 98.3 F (36.8 C) (Oral)   Ht 5' 6"  (1.676 m)   Wt 202 lb (91.6 kg)   BMI 32.60 kg/m    Subjective:    Patient ID: Laura Dalton, female    DOB: [birth year], 67 y.o.   MRN: [id number]  HPI: Laura Dalton is a 67 y.o. female presenting on 05/03/2017 for Hypertension (follow up); Diabetes; Hyperlipidemia; and Hypothyroidism   HPI Hypertension Patient is currently on amlodipine and losartan-hydrochlorothiazide, and their blood pressure today is 135/82. Patient denies any lightheadedness or dizziness. Patient denies headaches, blurred vision, chest pains, shortness of breath, or weakness. Denies any side effects from medication and is content with current medication.   Type 2 diabetes mellitus Patient comes in today for recheck of his diabetes. Patient has been currently taking metformin and gabapentin for neuropathy. Patient is currently on an ACE inhibitor/ARB. Patient has seen an ophthalmologist this year. Patient denies any new issues with their feet, patient has amitriptyline and gabapentin for neuropathy.   Hyperlipidemia Patient is coming in for recheck of his hyperlipidemia. The patient is currently taking Lipitor. They deny any issues with myalgias or history of liver damage from it. They deny any focal numbness or weakness or chest pain.   Hypothyroidism recheck Patient is coming in for thyroid recheck today as well. They deny any issues with hair changes or heat or cold problems or diarrhea or constipation. They deny any chest pain or palpitations. They are currently on levothyroxine 74mcrograms   COPD Patient is coming in for COPD recheck today.  He is currently on no medications except a rescue inhaler.  He has a mild chronic cough but denies any major coughing spells or wheezing spells.  He has 0nighttime symptoms per week and 0daytime symptoms per week currently.   Chronic back pain and curving of back Has chronic low back pain and  curving of the upper back and pain in her upper back as well and wanted to discuss possibly getting a back brace and Dr. GGladstone Lighterwas here in office and was able to discuss this with her.  She also needs refill of her medication.  She currently takes meloxicam.  Relevant past medical, surgical, family and social history reviewed and updated as indicated. Interim medical history since our last visit reviewed. Allergies and medications reviewed and updated.  Review of Systems  Constitutional: Negative for chills and fever.  HENT: Negative for congestion, ear discharge and ear pain.   Eyes: Negative for redness and visual disturbance.  Respiratory: Negative for chest tightness and shortness of breath.   Cardiovascular: Negative for chest pain and leg swelling.  Musculoskeletal: Positive for arthralgias and back pain. Negative for gait problem.  Skin: Negative for rash.  Neurological: Negative for light-headedness and headaches.  Psychiatric/Behavioral: Negative for agitation and behavioral problems.  All other systems reviewed and are negative.   Per HPI unless specifically indicated above   Allergies as of 05/03/2017      Reactions   Aspirin Hives      Medication List        Accurate as of 05/03/17 11:05 AM. Always use your most recent med list.          amitriptyline 100 MG tablet Commonly known as:  ELAVIL TAKE 1 TABLET BY MOUTH ONCE DAILY WITH BREAKFAST   amLODipine 2.5 MG tablet Commonly known as:  NORVASC TAKE 1 TABLET BY MOUTH ONCE DAILY   atorvastatin 40  MG tablet Commonly known as:  LIPITOR TAKE 1 TABLET BY MOUTH ONCE DAILY   calcium carbonate 1500 (600 Ca) MG Tabs tablet Commonly known as:  OSCAL Take by mouth 2 (two) times daily with a meal.   cholecalciferol 400 units Tabs tablet Commonly known as:  VITAMIN D Take 2,000 Units by mouth.   clopidogrel 75 MG tablet Commonly known as:  PLAVIX TAKE 1 TABLET BY MOUTH ONCE DAILY   gabapentin 600 MG  tablet Commonly known as:  NEURONTIN TAKE ONE TABLET BY MOUTH THREE TIMES DAILY   levothyroxine 88 MCG tablet Commonly known as:  SYNTHROID, LEVOTHROID Take 1 tablet (88 mcg total) by mouth daily.   losartan-hydrochlorothiazide 100-25 MG tablet Commonly known as:  HYZAAR TAKE 1 TABLET BY MOUTH ONCE DAILY   meloxicam 15 MG tablet Commonly known as:  MOBIC TAKE 1 TABLET BY MOUTH ONCE DAILY   metFORMIN 500 MG tablet Commonly known as:  GLUCOPHAGE TAKE 1 TABLET BY MOUTH ONCE DAILY   methocarbamol 500 MG tablet Commonly known as:  ROBAXIN TAKE ONE TABLET BY MOUTH EVERY 6 HOURS AS NEEDED FOR MUSCLE SPASM   nystatin-triamcinolone ointment Commonly known as:  MYCOLOG Apply 1 application topically 2 (two) times daily.   PRESERVISION AREDS 2 Caps Take by mouth.   PRESERVISION AREDS 2+MULTI VIT PO Take by mouth.   traMADol 50 MG tablet Commonly known as:  ULTRAM TAKE 1 TABLET BY MOUTH EVERY 12 HOURS AS NEEDED          Objective:    BP 135/82   Pulse 72   Temp 98.3 F (36.8 C) (Oral)   Ht 5' 6"  (1.676 m)   Wt 202 lb (91.6 kg)   BMI 32.60 kg/m   Wt Readings from Last 3 Encounters:  05/03/17 202 lb (91.6 kg)  10/10/16 196 lb 14.4 oz (89.3 kg)  08/31/16 199 lb (90.3 kg)    Physical Exam  Constitutional: She is oriented to person, place, and time. She appears well-developed and well-nourished. No distress.  Eyes: Conjunctivae are normal.  Neck: Neck supple. No thyromegaly present.  Cardiovascular: Normal rate, regular rhythm, normal heart sounds and intact distal pulses.  No murmur heard. Pulmonary/Chest: Effort normal and breath sounds normal. No respiratory distress. She has no wheezes. She has no rales.  Musculoskeletal: Normal range of motion. She exhibits tenderness (Upper back tenderness with mild kyphosis). She exhibits no edema.  Lymphadenopathy:    She has no cervical adenopathy.  Neurological: She is alert and oriented to person, place, and time.  Coordination normal.  Skin: Skin is warm and dry. No rash noted. She is not diaphoretic.  Psychiatric: She has a normal mood and affect. Her behavior is normal.  Nursing note and vitals reviewed.   Results for orders placed or performed in visit on 04/13/17  HM DIABETES EYE EXAM  Result Value Ref Range   HM Diabetic Eye Exam No Retinopathy No Retinopathy      Assessment & Plan:   Problem List Items Addressed This Visit      Cardiovascular and Mediastinum   Hypertension associated with diabetes ([place])   Relevant Medications   amLODipine (NORVASC) 2.5 MG tablet   atorvastatin (LIPITOR) 40 MG tablet   losartan-hydrochlorothiazide (HYZAAR) 100-25 MG tablet   metFORMIN (GLUCOPHAGE) 500 MG tablet   Other Relevant Orders   CMP14+EGFR (Completed)     Respiratory   COPD (chronic obstructive pulmonary disease) (HCC)     Endocrine   DM (diabetes mellitus) ([place]) -  Primary   Relevant Medications   atorvastatin (LIPITOR) 40 MG tablet   losartan-hydrochlorothiazide (HYZAAR) 100-25 MG tablet   metFORMIN (GLUCOPHAGE) 500 MG tablet   Other Relevant Orders   CMP14+EGFR (Completed)   Bayer DCA Hb A1c Waived (Completed)   Hyperlipidemia associated with type 2 diabetes mellitus (HCC)   Relevant Medications   amLODipine (NORVASC) 2.5 MG tablet   atorvastatin (LIPITOR) 40 MG tablet   losartan-hydrochlorothiazide (HYZAAR) 100-25 MG tablet   metFORMIN (GLUCOPHAGE) 500 MG tablet   Other Relevant Orders   Lipid panel (Completed)   Diabetic peripheral neuropathy associated with type 2 diabetes mellitus (HCC)   Relevant Medications   amitriptyline (ELAVIL) 100 MG tablet   atorvastatin (LIPITOR) 40 MG tablet   gabapentin (NEURONTIN) 600 MG tablet   losartan-hydrochlorothiazide (HYZAAR) 100-25 MG tablet   metFORMIN (GLUCOPHAGE) 500 MG tablet   methocarbamol (ROBAXIN) 500 MG tablet   Other Relevant Orders   CMP14+EGFR (Completed)   Bayer DCA Hb A1c Waived (Completed)   Hypothyroidism    Relevant Medications   levothyroxine (SYNTHROID, LEVOTHROID) 88 MCG tablet   Other Relevant Orders   TSH (Completed)     Other   Chronic back pain   Relevant Medications   meloxicam (MOBIC) 15 MG tablet   methocarbamol (ROBAXIN) 500 MG tablet   Obesity (BMI 30.0-34.9)   Relevant Orders   Lipid panel (Completed)    Other Visit Diagnoses    Kyphosis, unspecified kyphosis type, unspecified spinal region       Patient wants to see orthopedic for possible with racing of her back    Spoke with Dr. Gladstone Lighter here in the office and he gave her prescription to go to his office and get a back brace that will help her.  Follow up plan: Return in about 3 months (around 07/31/2017), or if symptoms worsen or fail to improve, for Type 2 diabetes and thyroid recheck.  Counseling provided for all of the vaccine components Orders Placed This Encounter  Procedures  . CMP14+EGFR  . Lipid panel  . Bayer DCA Hb A1c Waived  . TSH  . Ambulatory referral to [place] Dettinger, MD [facility] 05/03/2017, 11:05 AM

## 2017-05-04 LAB — CMP14+EGFR
ALK PHOS: 76 IU/L (ref 39–117)
ALT: 9 IU/L (ref 0–32)
AST: 17 IU/L (ref 0–40)
Albumin/Globulin Ratio: 1.8 (ref 1.2–2.2)
Albumin: 4.6 g/dL (ref 3.6–4.8)
BILIRUBIN TOTAL: 0.3 mg/dL (ref 0.0–1.2)
BUN/Creatinine Ratio: 13 (ref 12–28)
BUN: 10 mg/dL (ref 8–27)
CHLORIDE: 98 mmol/L (ref 96–106)
CO2: 26 mmol/L (ref 20–29)
Calcium: 9.9 mg/dL (ref 8.7–10.3)
Creatinine, Ser: 0.78 mg/dL (ref 0.57–1.00)
GFR calc non Af Amer: 79 mL/min/{1.73_m2} (ref >59)
GFR, EST AFRICAN AMERICAN: 91 mL/min/{1.73_m2} (ref >59)
GLUCOSE: 113 mg/dL — AB (ref 65–99)
Globulin, Total: 2.6 g/dL (ref 1.5–4.5)
Potassium: 4.2 mmol/L (ref 3.5–5.2)
Sodium: 141 mmol/L (ref 134–144)
Total Protein: 7.2 g/dL (ref 6.0–8.5)

## 2017-05-04 LAB — LIPID PANEL
CHOLESTEROL TOTAL: 201 mg/dL — AB (ref 100–199)
Chol/HDL Ratio: 4.8 ratio — ABNORMAL HIGH (ref 0.0–4.4)
HDL: 42 mg/dL (ref >39)
LDL Calculated: 131 mg/dL — ABNORMAL HIGH (ref 0–99)
TRIGLYCERIDES: 140 mg/dL (ref 0–149)
VLDL Cholesterol Cal: 28 mg/dL (ref 5–40)

## 2017-05-04 LAB — TSH: TSH: 3.94 u[IU]/mL (ref 0.450–4.500)

## 2017-05-05 ENCOUNTER — Other Ambulatory Visit: Payer: Self-pay | Admitting: Family

## 2017-05-05 DIAGNOSIS — I10 Essential (primary) hypertension: Secondary | ICD-10-CM

## 2017-05-07 DIAGNOSIS — M545 Low back pain: Secondary | ICD-10-CM | POA: Diagnosis not present

## 2017-05-07 DIAGNOSIS — M479 Spondylosis, unspecified: Secondary | ICD-10-CM | POA: Insufficient documentation

## 2017-05-10 ENCOUNTER — Other Ambulatory Visit: Payer: Self-pay | Admitting: Family Medicine

## 2017-05-10 NOTE — Telephone Encounter (Signed)
What is the name of the medication? Amitriptyline 100 mg  Have you contacted your pharmacy to request a refill? YES  Which pharmacy would you like this sent to? [facility]   Patient notified that their request is being sent to the clinical staff for review and that they should receive a call once it is complete. If they do not receive a call within 24 hours they can check with their pharmacy or our office.

## 2017-05-10 NOTE — Telephone Encounter (Signed)
[facility], we have refaxed this again

## 2017-06-01 DIAGNOSIS — Z7984 Long term (current) use of oral hypoglycemic drugs: Secondary | ICD-10-CM | POA: Diagnosis not present

## 2017-06-01 DIAGNOSIS — G9389 Other specified disorders of brain: Secondary | ICD-10-CM | POA: Diagnosis not present

## 2017-06-01 DIAGNOSIS — R110 Nausea: Secondary | ICD-10-CM | POA: Diagnosis not present

## 2017-06-01 DIAGNOSIS — R51 Headache: Secondary | ICD-10-CM | POA: Diagnosis not present

## 2017-06-01 DIAGNOSIS — Z79899 Other long term (current) drug therapy: Secondary | ICD-10-CM | POA: Diagnosis not present

## 2017-06-01 DIAGNOSIS — I10 Essential (primary) hypertension: Secondary | ICD-10-CM | POA: Diagnosis not present

## 2017-06-01 DIAGNOSIS — Z7902 Long term (current) use of antithrombotics/antiplatelets: Secondary | ICD-10-CM | POA: Diagnosis not present

## 2017-06-01 DIAGNOSIS — E119 Type 2 diabetes mellitus without complications: Secondary | ICD-10-CM | POA: Diagnosis not present

## 2017-06-01 DIAGNOSIS — Z886 Allergy status to analgesic agent status: Secondary | ICD-10-CM | POA: Diagnosis not present

## 2017-06-01 DIAGNOSIS — Z9889 Other specified postprocedural states: Secondary | ICD-10-CM | POA: Diagnosis not present

## 2017-08-03 ENCOUNTER — Encounter: Payer: Self-pay | Admitting: Family Medicine

## 2017-08-03 ENCOUNTER — Ambulatory Visit (INDEPENDENT_AMBULATORY_CARE_PROVIDER_SITE_OTHER): Payer: PPO | Admitting: Family Medicine

## 2017-08-03 VITALS — BP 136/77 | HR 81 | Temp 98.8°F | Ht 66.0 in | Wt 197.0 lb

## 2017-08-03 DIAGNOSIS — E1159 Type 2 diabetes mellitus with other circulatory complications: Secondary | ICD-10-CM | POA: Diagnosis not present

## 2017-08-03 DIAGNOSIS — E039 Hypothyroidism, unspecified: Secondary | ICD-10-CM

## 2017-08-03 DIAGNOSIS — E785 Hyperlipidemia, unspecified: Secondary | ICD-10-CM

## 2017-08-03 DIAGNOSIS — M549 Dorsalgia, unspecified: Secondary | ICD-10-CM

## 2017-08-03 DIAGNOSIS — I152 Hypertension secondary to endocrine disorders: Secondary | ICD-10-CM

## 2017-08-03 DIAGNOSIS — E669 Obesity, unspecified: Secondary | ICD-10-CM

## 2017-08-03 DIAGNOSIS — E1142 Type 2 diabetes mellitus with diabetic polyneuropathy: Secondary | ICD-10-CM

## 2017-08-03 DIAGNOSIS — J449 Chronic obstructive pulmonary disease, unspecified: Secondary | ICD-10-CM | POA: Diagnosis not present

## 2017-08-03 DIAGNOSIS — G8929 Other chronic pain: Secondary | ICD-10-CM | POA: Diagnosis not present

## 2017-08-03 DIAGNOSIS — Z0100 Encounter for examination of eyes and vision without abnormal findings: Secondary | ICD-10-CM

## 2017-08-03 DIAGNOSIS — I10 Essential (primary) hypertension: Secondary | ICD-10-CM

## 2017-08-03 DIAGNOSIS — E1169 Type 2 diabetes mellitus with other specified complication: Secondary | ICD-10-CM | POA: Diagnosis not present

## 2017-08-03 DIAGNOSIS — Z1211 Encounter for screening for malignant neoplasm of colon: Secondary | ICD-10-CM

## 2017-08-03 LAB — BAYER DCA HB A1C WAIVED: HB A1C (BAYER DCA - WAIVED): 6.2 % (ref <7.0)

## 2017-08-03 MED ORDER — LOSARTAN POTASSIUM-HCTZ 100-25 MG PO TABS: 1.0000 | ORAL_TABLET | Freq: Every day | ORAL | 1 refills | Status: DC

## 2017-08-03 MED ORDER — LEVOTHYROXINE SODIUM 88 MCG PO TABS: 88.0000 ug | ORAL_TABLET | Freq: Every day | ORAL | 1 refills | Status: DC

## 2017-08-03 MED ORDER — CLOPIDOGREL BISULFATE 75 MG PO TABS: 75.0000 mg | ORAL_TABLET | Freq: Every day | ORAL | 1 refills | Status: DC

## 2017-08-03 MED ORDER — ATORVASTATIN CALCIUM 40 MG PO TABS: 40.0000 mg | ORAL_TABLET | Freq: Every day | ORAL | 1 refills | Status: DC

## 2017-08-03 MED ORDER — TRAMADOL HCL 50 MG PO TABS: 50.0000 mg | ORAL_TABLET | Freq: Two times a day (BID) | ORAL | 2 refills | Status: DC | PRN

## 2017-08-03 MED ORDER — MELOXICAM 15 MG PO TABS: 15.0000 mg | ORAL_TABLET | Freq: Every day | ORAL | 1 refills | Status: DC

## 2017-08-03 NOTE — Progress Notes (Signed)
BP 136/77   Pulse 81   Temp 98.8 F (37.1 C) (Oral)   Ht  (1.676 m)   Wt 197 lb (89.4 kg)   BMI 31.80 kg/m    Subjective:    Patient ID: Laura Dalton, female    DOB: 1950/11/20, 67 y.o.   MRN: [id number]  HPI: Laura Dalton is a 67 y.o. female presenting on 08/03/2017 for Diabetes (discuss Tramadol, wondering if she could just take 3-4 Ibuprofen instead); Hypothyroidism; Hyperlipidemia; and Hypertension   HPI Type 2 diabetes mellitus Patient comes in today for recheck of his diabetes. Patient has been currently taking metformin 500 daily. Patient is currently on an ACE inhibitor/ARB. Patient has not seen an ophthalmologist this year. Patient denies any issues with their feet.  Patient does have neuropathy but says it has not been worsened and is mostly controlled  Hypertension Patient is currently on losartan-hydrochlorothiazide, and their blood pressure today is 136/77. Patient denies any lightheadedness or dizziness. Patient denies headaches, blurred vision, chest pains, shortness of breath, or weakness. Denies any side effects from medication and is content with current medication.  Hypothyroidism recheck Patient is coming in for thyroid recheck today as well. They deny any issues with hair changes or heat or cold problems or diarrhea or constipation. They deny any chest pain or palpitations. They are currently on levothyroxine 88 micrograms   Hyperlipidemia Patient is coming in for recheck of his hyperlipidemia. The patient is currently taking atorvastatin. They deny any issues with myalgias or history of liver damage from it. They deny any focal numbness or weakness or chest pain.   COPD Patient is coming in for COPD recheck today.  He is currently on no medication and has been doing well and denies any issues.  He has a mild chronic cough but denies any major coughing spells or wheezing spells.  He has 0nighttime symptoms per week and 0daytime symptoms per week  currently.    Relevant past medical, surgical, family and social history reviewed and updated as indicated. Interim medical history since our last visit reviewed. Allergies and medications reviewed and updated.  Review of Systems  Constitutional: Negative for chills and fever.  HENT: Negative for congestion, ear discharge and ear pain.   Eyes: Negative for redness and visual disturbance.  Respiratory: Negative for chest tightness and shortness of breath.   Cardiovascular: Negative for chest pain and leg swelling.  Genitourinary: Negative for difficulty urinating and dysuria.  Musculoskeletal: Negative for back pain and gait problem.  Skin: Negative for rash.  Neurological: Positive for numbness. Negative for weakness, light-headedness and headaches.  Psychiatric/Behavioral: Negative for agitation and behavioral problems.  All other systems reviewed and are negative.   Per HPI unless specifically indicated above   Allergies as of 08/03/2017      Reactions   Aspirin Hives      Medication List        Accurate as of 08/03/17 11:05 AM. Always use your most recent med list.          amitriptyline 100 MG tablet Commonly known as:  ELAVIL TAKE 1 TABLET BY MOUTH ONCE DAILY WITH BREAKFAST   amLODipine 2.5 MG tablet Commonly known as:  NORVASC Take 1 tablet (2.5 mg total) by mouth daily.   atorvastatin 40 MG tablet Commonly known as:  LIPITOR Take 1 tablet (40 mg total) by mouth daily.   calcium carbonate 1500 (600 Ca) MG Tabs tablet Commonly known as:  OSCAL Take by mouth  2 (two) times daily with a meal.   cholecalciferol 400 units Tabs tablet Commonly known as:  VITAMIN D Take 2,000 Units by mouth.   clopidogrel 75 MG tablet Commonly known as:  PLAVIX Take 1 tablet (75 mg total) by mouth daily.   gabapentin 600 MG tablet Commonly known as:  NEURONTIN Take 1 tablet (600 mg total) by mouth 3 (three) times daily.   levothyroxine 88 MCG tablet Commonly known as:   SYNTHROID, LEVOTHROID Take 1 tablet (88 mcg total) by mouth daily.   losartan-hydrochlorothiazide 100-25 MG tablet Commonly known as:  HYZAAR Take 1 tablet by mouth daily.   metFORMIN 500 MG tablet Commonly known as:  GLUCOPHAGE Take 1 tablet (500 mg total) by mouth daily.   methocarbamol 500 MG tablet Commonly known as:  ROBAXIN TAKE ONE TABLET BY MOUTH EVERY 6 HOURS AS NEEDED FOR MUSCLE SPASM   PRESERVISION AREDS 2 Caps Take by mouth.   PRESERVISION AREDS 2+MULTI VIT PO Take by mouth.   traMADol 50 MG tablet Commonly known as:  ULTRAM TAKE 1 TABLET BY MOUTH EVERY 12 HOURS AS NEEDED          Objective:    BP 136/77   Pulse 81   Temp 98.8 F (37.1 C) (Oral)   Ht  (1.676 m)   Wt 197 lb (89.4 kg)   BMI 31.80 kg/m   Wt Readings from Last 3 Encounters:  08/03/17 197 lb (89.4 kg)  05/03/17 202 lb (91.6 kg)  10/10/16 196 lb 14.4 oz (89.3 kg)    Physical Exam  Constitutional: She is oriented to person, place, and time. She appears well-developed and well-nourished. No distress.  Eyes: Conjunctivae are normal.  Neck: Neck supple. No thyromegaly present.  Cardiovascular: Normal rate, regular rhythm, normal heart sounds and intact distal pulses.  No murmur heard. Pulmonary/Chest: Effort normal and breath sounds normal. No respiratory distress. She has no wheezes.  Musculoskeletal: Normal range of motion. She exhibits no edema.  Lymphadenopathy:    She has no cervical adenopathy.  Neurological: She is alert and oriented to person, place, and time. Coordination normal.  Skin: Skin is warm and dry. No rash noted. She is not diaphoretic.  Psychiatric: She has a normal mood and affect. Her behavior is normal.  Nursing note and vitals reviewed.   Diabetic Foot Exam - Simple   Simple Foot Form Diabetic Foot exam was performed with the following findings:  Yes 08/03/2017 11:05 AM  Visual Inspection No deformities, no ulcerations, no other skin breakdown bilaterally:   Yes Sensation Testing Intact to touch and monofilament testing bilaterally:  Yes Pulse Check Posterior Tibialis and Dorsalis pulse intact bilaterally:  Yes Comments Onychomycosis on left great toe, otherwise normal exam        Assessment & Plan:   Problem List Items Addressed This Visit      Cardiovascular and Mediastinum   Hypertension associated with diabetes (HCC)   Relevant Medications   losartan-hydrochlorothiazide (HYZAAR) 100-25 MG tablet   atorvastatin (LIPITOR) 40 MG tablet     Respiratory   COPD (chronic obstructive pulmonary disease) (HCC)     Endocrine   DM (diabetes mellitus) (HCC) - Primary   Relevant Medications   losartan-hydrochlorothiazide (HYZAAR) 100-25 MG tablet   atorvastatin (LIPITOR) 40 MG tablet   Other Relevant Orders   Microalbumin / creatinine urine ratio   Bayer DCA Hb A1c Waived   Hyperlipidemia associated with type 2 diabetes mellitus (HCC)   Relevant Medications   losartan-hydrochlorothiazide (  HYZAAR) 100-25 MG tablet   atorvastatin (LIPITOR) 40 MG tablet   Diabetic peripheral neuropathy associated with type 2 diabetes mellitus (HCC)   Relevant Medications   losartan-hydrochlorothiazide (HYZAAR) 100-25 MG tablet   atorvastatin (LIPITOR) 40 MG tablet   Other Relevant Orders   Microalbumin / creatinine urine ratio   Bayer DCA Hb A1c Waived   Hypothyroidism   Relevant Medications   levothyroxine (SYNTHROID, LEVOTHROID) 88 MCG tablet   Other Relevant Orders   TSH     Other   Obesity (BMI 30.0-34.9)    Other Visit Diagnoses    Colon cancer screening       Relevant Orders   Cologuard    Patient says blood sugars are up some, will check A1c and if it is elevated may increase metformin.  Follow up plan: Return in about 3 months (around 11/03/2017), or if symptoms worsen or fail to improve, for Diabetes recheck.  Counseling provided for all of the vaccine components Orders Placed This Encounter  Procedures  . Microalbumin /  creatinine urine ratio  . Bayer DCA Hb A1c Waived  . TSH  . Cologuard    Arville Care, MD [facility] 08/03/2017, 11:05 AM

## 2017-08-03 NOTE — Addendum Note (Signed)
Addended by: Arville Care on: 08/03/2017 11:14 AM   Modules accepted: Orders

## 2017-08-03 NOTE — Addendum Note (Signed)
Addended by: Quay Burow on: 08/03/2017 11:51 AM   Modules accepted: Orders

## 2017-08-03 NOTE — Addendum Note (Signed)
Addended by: Quay Burow on: 08/03/2017 12:00 PM   Modules accepted: Orders

## 2017-08-04 LAB — MICROALBUMIN / CREATININE URINE RATIO: Creatinine, Urine: 24.3 mg/dL

## 2017-08-04 LAB — TSH: TSH: 3.35 u[IU]/mL (ref 0.450–4.500)

## 2017-08-13 ENCOUNTER — Other Ambulatory Visit: Payer: Self-pay | Admitting: Family Medicine

## 2017-08-13 DIAGNOSIS — E1142 Type 2 diabetes mellitus with diabetic polyneuropathy: Secondary | ICD-10-CM

## 2017-08-14 ENCOUNTER — Other Ambulatory Visit: Payer: Self-pay | Admitting: Family Medicine

## 2017-08-14 DIAGNOSIS — E1142 Type 2 diabetes mellitus with diabetic polyneuropathy: Secondary | ICD-10-CM

## 2017-08-18 ENCOUNTER — Other Ambulatory Visit: Payer: Self-pay | Admitting: Family Medicine

## 2017-08-18 DIAGNOSIS — E1142 Type 2 diabetes mellitus with diabetic polyneuropathy: Secondary | ICD-10-CM

## 2017-09-03 ENCOUNTER — Ambulatory Visit (INDEPENDENT_AMBULATORY_CARE_PROVIDER_SITE_OTHER): Payer: PPO | Admitting: *Deleted

## 2017-09-03 ENCOUNTER — Encounter: Payer: Self-pay | Admitting: *Deleted

## 2017-09-03 VITALS — BP 125/69 | HR 67 | Ht 63.25 in | Wt 196.0 lb

## 2017-09-03 DIAGNOSIS — Z0000 Encounter for general adult medical examination without abnormal findings: Secondary | ICD-10-CM | POA: Diagnosis not present

## 2017-09-03 DIAGNOSIS — Z1211 Encounter for screening for malignant neoplasm of colon: Secondary | ICD-10-CM

## 2017-09-03 NOTE — Patient Instructions (Signed)
  [name] , Thank you for taking time to come for your Medicare Wellness Visit. I appreciate your ongoing commitment to your health goals. Please review the following plan we discussed and let me know if I can assist you in the future.   These are the goals we discussed: Goals    . Exercise 3x per week (30 min per time)       This is a list of the screening recommended for you and due dates:  Health Maintenance  Topic Date Due  . Colon Cancer Screening  08/15/1999  . Pneumonia vaccines (1 of 2 - PCV13) 05/03/2018*  . Stool Blood Test  09/21/2017  . Flu Shot  10/11/2017  . Eye exam for diabetics  01/04/2018  . Hemoglobin A1C  02/03/2018  . Complete foot exam   08/04/2018  . Mammogram  10/10/2018  . Tetanus Vaccine  03/13/2020  . DEXA scan (bone density measurement)  Completed  .  Hepatitis C: One time screening is recommended by Center for Disease Control  (CDC) for  adults born from [birth year] through 1966.   Completed  *Topic was postponed. The date shown is not the original due date.   Return stool card whenever you complete it.

## 2017-09-03 NOTE — Progress Notes (Addendum)
Subjective:   Laura Dalton is a 67 y.o. female who presents for a Medicare Annual Wellness Visit. Ameliarose lives at home alone. Her 68 year old grandson recently moved out. He has been with her since age 81. She has 3 other adult children. Her husband passed away a few years ago. She is retired. She lost her job at [employer] when [employer] shut down and she went back to school and obtained an associates degree in Early Childhood Development. She retired from working in Furniture conservator/restorer. She enjoys painting and crafting and works in her home and yard often.   Review of Systems    Patient reports that her overall health is unchanged compared to last year.  Cardiac Risk Factors include: advanced age (>82men, >51 women);hypertension;dyslipidemia;obesity (BMI >30kg/m2);sedentary lifestyle  Musc: chronic back/hip pain  Neuro: lower extremity neuropathy  All other systems negative       Current Medications (verified) Outpatient Encounter Medications as of 09/03/2017  Medication Sig  . amitriptyline (ELAVIL) 100 MG tablet TAKE 1 TABLET BY MOUTH ONCE DAILY WITH BREAKFAST  . amLODipine (NORVASC) 2.5 MG tablet Take 1 tablet (2.5 mg total) by mouth daily.  Marland Kitchen atorvastatin (LIPITOR) 40 MG tablet Take 1 tablet (40 mg total) by mouth daily.  . calcium carbonate (OSCAL) 1500 (600 Ca) MG TABS tablet Take by mouth 2 (two) times daily with a meal.  . cholecalciferol (VITAMIN D) 400 units TABS tablet Take 2,000 Units by mouth.  . clopidogrel (PLAVIX) 75 MG tablet Take 1 tablet (75 mg total) by mouth daily.  Marland Kitchen gabapentin (NEURONTIN) 600 MG tablet TAKE 1 TABLET BY MOUTH THREE TIMES DAILY  . levothyroxine (SYNTHROID, LEVOTHROID) 88 MCG tablet Take 1 tablet (88 mcg total) by mouth daily.  Marland Kitchen losartan-hydrochlorothiazide (HYZAAR) 100-25 MG tablet Take 1 tablet by mouth daily.  . meloxicam (MOBIC) 15 MG tablet Take 1 tablet (15 mg total) by mouth daily.  . metFORMIN (GLUCOPHAGE) 500 MG tablet Take 1 tablet (500 mg  total) by mouth daily.  . methocarbamol (ROBAXIN) 500 MG tablet TAKE ONE TABLET BY MOUTH EVERY 6 HOURS AS NEEDED FOR MUSCLE SPASM  . Multiple Vitamins-Minerals (PRESERVISION AREDS 2) CAPS Take by mouth.  . Multiple Vitamins-Minerals (PRESERVISION AREDS 2+MULTI VIT PO) Take by mouth.  . traMADol (ULTRAM) 50 MG tablet Take 1 tablet (50 mg total) by mouth every 12 (twelve) hours as needed.   No facility-administered encounter medications on file as of 09/03/2017.     Allergies (verified) Aspirin   History: Past Medical History:  Diagnosis Date  . Bursitis of hip   . Diabetes mellitus without complication (HCC)    type II  . Hypertension   . Hypothyroidism   . Low back pain    benign turmor back  . Stroke (HCC)   . Vitamin D deficiency disease    Past Surgical History:  Procedure Laterality Date  . APPENDECTOMY    . BACK SURGERY  1996   turmor on back   . CHOLECYSTECTOMY    . LEG SURGERY Right    "clot and had drain in leg  rt lower leg   Family History  Problem Relation Age of Onset  . Diabetes Mother   . Stroke Mother   . Seizures Mother   . Cancer Father        prostate  . Thyroid disease Son   . Hashimoto's thyroiditis Son   . COPD Sister   . Cancer Brother  lung  . COPD Sister   . COPD Sister   . Cancer Brother        prostate  . COPD Brother   . COPD Brother    Social History   Socioeconomic History  . Marital status: Widowed    Spouse name: Not on file  . Number of children: 3  . Years of education: Not on file  . Highest education level: Not on file  Occupational History  . 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 Financial resource strain: Not hard at all  . Food insecurity:    Worry: Never true    Inability: Never true  . Transportation needs:    Medical: No    Non-medical: No  Tobacco Use  . Smoking status: Former Smoker    Packs/day: 0.50    Years: 45.00    Pack years: 22.50    Types: Cigarettes    Start  date: 08/15/1962    Last attempt to quit: 12/01/2007    Years since quitting: 9.7  . Smokeless tobacco: Never Used  Substance and Sexual Activity  . Alcohol use: No    Alcohol/week: 0.0 oz  . Drug use: No  . Sexual activity: Not Currently  Lifestyle  . Physical activity:    Days per week: 7 days    Minutes per session: 60 min  . Stress: To some extent  Relationships  . Social connections:    Talks on phone: More than three times a week    Gets together: More than three times a week    Attends religious service: More than 4 times per year    Active member of club or organization: Yes    Attends meetings of clubs or organizations: More than 4 times per year    Relationship status: Widowed  Other Topics Concern  . Not on file  Social History Narrative  . Not on file    Tobacco Use No.  Clinical Intake:     Pain : 0-10 Pain Score: 9  Pain Type: Chronic pain Pain Location: Back Pain Descriptors / Indicators: Aching Pain Onset: More than a month ago Pain Frequency: Constant Pain Relieving Factors: Rest Effect of Pain on Daily Activities: Moderate-Tries to work through pain but it very limiting  Pain Relieving Factors: Rest  Nutritional Status: BMI > 30  Obese Diabetes: No  How often do you need to have someone help you when you read instructions, pamphlets, or other written materials from your doctor or pharmacy?: 1 - Never What is the last grade level you completed in school?: associates degree  Interpreter Needed?: No  Information entered by :: Demetrios Loll, RN   Activities of Daily Living In your present state of health, do you have any difficulty performing the following activities: 09/03/2017  Hearing? N  Vision? N  Difficulty concentrating or making decisions? Y  Comment Has noticed some decrease in short term memory  Walking or climbing stairs? Y  Comment some difficulty coming down stairs  Dressing or bathing? N  Doing errands, shopping? N  Preparing  Food and eating ? N  Using the Toilet? N  In the past six months, have you accidently leaked urine? N  Do you have problems with loss of bowel control? N  Managing your Medications? N  Managing your Finances? N  Housekeeping or managing your Housekeeping? N  Some recent data might be hidden     Diet Eats from 3:00 to 10:00  om. No set meals. Eats when she's hungry Drinks water throughout the day. Estimates about a gallon a day.   Exercise Current Exercise Habits: The patient does not participate in regular exercise at present, Exercise limited by: orthopedic condition(s);neurologic condition(s)(neuropathy and chronic back pain)   Depression Screen PHQ 2/9 Scores 09/03/2017 08/03/2017 05/03/2017 08/31/2016 08/02/2016 10/07/2015 09/23/2015  PHQ - 2 Score 0 0 0 0 1 0 0     Fall Risk Fall Risk  09/03/2017 08/03/2017 05/03/2017 08/31/2016 10/07/2015  Falls in the past year? No No No No No    Safety Is the patient's home free of loose throw rugs in walkways, pet beds, electrical cords, etc?   yes      Grab bars in the bathroom? yes      Walkin shower? yes      Shower Seat? no      Handrails on the stairs?   yes      Adequate lighting?   yes  Patient Care Team: Dettinger, Elige RadonJoshua A, MD as PCP - General (Family Medicine) Donnetta HailBeekman, James F, MD as Consulting Physician (Rheumatology) Ranee GosselinGioffre, Ronald, MD as Consulting Physician (Orthopedic Surgery) Len BlalockSetzer, Terri L, NP as Nurse Practitioner (Internal Medicine)   ED visit 06/01/17 for nausea. Daughter was concerned about her heart. No other ED visits. No hospitalizations or surgeries over the past year.   Objective:    Today's Vitals   09/03/17 0841  BP: 125/69  Pulse: 67  Weight: 196 lb (88.9 kg)  Height: 5' 3.25" (1.607 m)  PainSc: 9    Body mass index is 34.45 kg/m.  Advanced Directives 09/03/2017 08/31/2016 08/01/2015 04/15/2015 04/14/2015 11/04/2013  Does Patient Have a Medical Advance Directive? Yes No Yes Yes No No  Type of Sports coachAdvance  Directive Healthcare Power of [place];Living will - Living will Living will - -  Does patient want to make changes to medical advance directive? No - Patient declined - - No - Patient declined - -  Copy of Healthcare Power of Attorney in Chart? No - copy requested - No - copy requested No - copy requested - -  Would patient like information on creating a medical advance directive? - Yes (MAU/Ambulatory/Procedural Areas - Information given) - No - patient declined information - No - patient declined information    Hearing/Vision  normal or No deficits noted during visit.  Cognitive Function: MMSE - Mini Mental State Exam 09/03/2017 08/31/2016  Orientation to time 5 5  Orientation to Place 5 5  Registration 3 3  Attention/ Calculation 5 5  Recall 2 3  Language- name 2 objects 2 2  Language- repeat 1 1  Language- follow 3 step command 3 3  Language- read & follow direction 1 1  Write a sentence 1 1  Copy design 1 1  Total score 29 30       Normal Cognitive Function Screening: Yes    Immunizations and Health Maintenance Immunization History  Administered Date(s) Administered  . Influenza Whole 03/13/2010  . Tdap 03/13/2010   Health Maintenance Due  Topic Date Due  . COLONOSCOPY  08/15/1999   Health Maintenance  Topic Date Due  . COLONOSCOPY  08/15/1999  . PNA vac Low Risk Adult (1 of 2 - PCV13) 05/03/2018 (Originally 08/15/2014)  . COLON CANCER SCREENING ANNUAL FOBT  09/21/2017  . INFLUENZA VACCINE  10/11/2017  . OPHTHALMOLOGY EXAM  01/04/2018  . HEMOGLOBIN A1C  02/03/2018  . FOOT EXAM  08/04/2018  . MAMMOGRAM  10/10/2018  . TETANUS/TDAP  03/13/2020  . DEXA SCAN  Completed  . Hepatitis C Screening  Completed        Assessment:   This is a routine wellness examination for Farrin.    Plan:    Goals    . Exercise 3x per week (30 min per time)        Health Maintenance Recommendations: Colorectal cancer screening Declined colonoscopy. FOBT given today.     Additional Screening Recommendations: Lung: Low Dose CT Chest recommended if Age 99-80 years, 30 pack-year currently smoking OR have quit w/in 15years. Patient does not qualify. Hepatitis C Screening recommended: no  Today's Orders Orders Placed This Encounter  Procedures  . Fecal occult blood, imunochemical    Standing Status:   Future    Standing Expiration Date:   09/27/2017    Keep f/u with Dettinger, Elige Radon, MD and any other specialty appointments you may have Continue current medications Move carefully to avoid falls. Use assistive devices like a can or walker if needed. Aim for at least 150 minutes of moderate activity a week. This can be done with chair exercises if necessary. Read or work on puzzles daily Stay connected with friends and family  I have personally reviewed and noted the following in the patient's chart:   . Medical and social history . Use of alcohol, tobacco or illicit drugs  . Current medications and supplements . Functional ability and status . Nutritional status . Physical activity . Advanced directives . List of other physicians . Hospitalizations, surgeries, and ER visits in previous 12 months . Vitals . Screenings to include cognitive, depression, and falls . Referrals and appointments  In addition, I have reviewed and discussed with patient certain preventive protocols, quality metrics, and best practice recommendations. A written personalized care plan for preventive services as well as general preventive health recommendations were provided to patient.     Demetrios Loll, RN   09/03/2017    I have reviewed and agree with the above AWV documentation.   Jannifer Rodney, FNP

## 2017-09-17 ENCOUNTER — Other Ambulatory Visit: Payer: Self-pay | Admitting: Family Medicine

## 2017-09-17 DIAGNOSIS — E1159 Type 2 diabetes mellitus with other circulatory complications: Secondary | ICD-10-CM

## 2017-09-17 DIAGNOSIS — I10 Essential (primary) hypertension: Principal | ICD-10-CM

## 2017-10-23 ENCOUNTER — Ambulatory Visit (INDEPENDENT_AMBULATORY_CARE_PROVIDER_SITE_OTHER): Payer: PPO | Admitting: Nurse Practitioner

## 2017-10-23 ENCOUNTER — Ambulatory Visit (INDEPENDENT_AMBULATORY_CARE_PROVIDER_SITE_OTHER): Payer: PPO

## 2017-10-23 ENCOUNTER — Encounter: Payer: Self-pay | Admitting: Nurse Practitioner

## 2017-10-23 VITALS — BP 121/65 | HR 77 | Temp 98.4°F | Ht 63.0 in | Wt 192.0 lb

## 2017-10-23 DIAGNOSIS — J069 Acute upper respiratory infection, unspecified: Secondary | ICD-10-CM | POA: Diagnosis not present

## 2017-10-23 DIAGNOSIS — R0989 Other specified symptoms and signs involving the circulatory and respiratory systems: Secondary | ICD-10-CM

## 2017-10-23 MED ORDER — AZITHROMYCIN 250 MG PO TABS: ORAL_TABLET | ORAL | 0 refills | Status: DC

## 2017-10-23 NOTE — Progress Notes (Signed)
   Subjective:    Patient ID: Laura Dalton, female    DOB: 12/08/1950, 67 y.o.   MRN: [id number]   Chief Complaint: Cough; chest congestion; and Back Pain   HPI Patient come sin today c/o cough for the last week. Has been spitting up a lot of dark phlegm. Coughing has caused her back to hurt. Back pain hurts worse when she bends over to pick up something.   Review of Systems  Constitutional: Positive for appetite change (decreased). Negative for chills and fever.  HENT: Positive for congestion, ear pain (left), sinus pressure and sinus pain. Negative for sore throat and trouble swallowing.   Respiratory: Positive for cough and shortness of breath.   Cardiovascular: Negative.   Gastrointestinal: Negative.   Genitourinary: Negative.   Neurological: Negative.   Psychiatric/Behavioral: Negative.   All other systems reviewed and are negative.      Objective:   Physical Exam  Constitutional: She is oriented to person, place, and time. She appears well-developed and well-nourished. She appears distressed (mild).  HENT:  Right Ear: Hearing, tympanic membrane, external ear and ear canal normal.  Left Ear: Hearing, tympanic membrane, external ear and ear canal normal.  Nose: Mucosal edema and rhinorrhea present. Right sinus exhibits maxillary sinus tenderness. Left sinus exhibits maxillary sinus tenderness.  Mouth/Throat: Uvula is midline, oropharynx is clear and moist and mucous membranes are normal.  Cardiovascular: Normal rate and regular rhythm.  Pulmonary/Chest: Effort normal.  Deep dry cough   Neurological: She is alert and oriented to person, place, and time.  Skin: Skin is warm and dry.  Psychiatric: She has a normal mood and affect. Her behavior is normal. Thought content normal.   BP 121/65   Pulse 77   Temp 98.4 F (36.9 C) (Oral)   Ht 5\' 3"  (1.6 m)   Wt 192 lb (87.1 kg)   SpO2 95%   BMI 34.01 kg/m         Assessment & Plan:  Laura Dalton in today with  chief complaint of Cough; chest congestion; and Back Pain   1. Chest congestion - DG Chest 2 View; Future  2. Upper respiratory infection with cough and congestion Meds ordered this encounter  Medications  . azithromycin (ZITHROMAX Z-PAK) 250 MG tablet    Sig: As directed    Dispense:  6 tablet    Refill:  0    Order Specific Question:   Supervising Provider    Answer:   VINCENT, CAROL L [[id number]]   1. Take meds as prescribed 2. Use a cool mist humidifier especially during the winter months and when heat has been humid. 3. Use saline nose sprays frequently 4. Saline irrigations of the nose can be very helpful if done frequently.  * 4X daily for 1 week*  * Use of a nettie pot can be helpful with this. Follow directions with this* 5. Drink plenty of fluids 6. Keep thermostat turn down low 7.For any cough or congestion  Use plain Mucinex- regular strength or max strength is fine   * Children- consult with Pharmacist for dosing 8. For fever or aces or pains- take tylenol or ibuprofen appropriate for age and weight.  * for fevers greater than 101 orally you may alternate ibuprofen and tylenol every  3 hours.   Laura Daphine DeutscherMartin, FNP

## 2017-10-23 NOTE — Patient Instructions (Signed)

## 2017-11-07 ENCOUNTER — Encounter: Payer: Self-pay | Admitting: Family Medicine

## 2017-11-07 ENCOUNTER — Ambulatory Visit (INDEPENDENT_AMBULATORY_CARE_PROVIDER_SITE_OTHER): Payer: PPO | Admitting: Family Medicine

## 2017-11-07 VITALS — BP 123/74 | HR 70 | Temp 97.4°F | Ht 63.0 in | Wt 193.0 lb

## 2017-11-07 DIAGNOSIS — I10 Essential (primary) hypertension: Secondary | ICD-10-CM

## 2017-11-07 DIAGNOSIS — E039 Hypothyroidism, unspecified: Secondary | ICD-10-CM

## 2017-11-07 DIAGNOSIS — Z1211 Encounter for screening for malignant neoplasm of colon: Secondary | ICD-10-CM | POA: Diagnosis not present

## 2017-11-07 DIAGNOSIS — E1142 Type 2 diabetes mellitus with diabetic polyneuropathy: Secondary | ICD-10-CM | POA: Diagnosis not present

## 2017-11-07 DIAGNOSIS — E785 Hyperlipidemia, unspecified: Secondary | ICD-10-CM

## 2017-11-07 DIAGNOSIS — E1169 Type 2 diabetes mellitus with other specified complication: Secondary | ICD-10-CM | POA: Diagnosis not present

## 2017-11-07 DIAGNOSIS — E1159 Type 2 diabetes mellitus with other circulatory complications: Secondary | ICD-10-CM

## 2017-11-07 LAB — BAYER DCA HB A1C WAIVED: HB A1C (BAYER DCA - WAIVED): 6.3 % (ref <7.0)

## 2017-11-07 MED ORDER — AMITRIPTYLINE HCL 100 MG PO TABS: 100.0000 mg | ORAL_TABLET | Freq: Every day | ORAL | 3 refills | Status: DC

## 2017-11-07 MED ORDER — TRAMADOL HCL 50 MG PO TABS: 50.0000 mg | ORAL_TABLET | Freq: Two times a day (BID) | ORAL | 2 refills | Status: DC | PRN

## 2017-11-07 MED ORDER — METFORMIN HCL 500 MG PO TABS: 500.0000 mg | ORAL_TABLET | Freq: Every day | ORAL | 3 refills | Status: DC

## 2017-11-07 MED ORDER — GABAPENTIN 600 MG PO TABS: 600.0000 mg | ORAL_TABLET | Freq: Three times a day (TID) | ORAL | 3 refills | Status: DC

## 2017-11-07 MED ORDER — AMLODIPINE BESYLATE 2.5 MG PO TABS: 2.5000 mg | ORAL_TABLET | Freq: Every day | ORAL | 3 refills | Status: DC

## 2017-11-07 NOTE — Addendum Note (Signed)
Addended by: Prescott GumLAND, Richad Ramsay M on: 11/07/2017 02:59 PM   Modules accepted: Orders

## 2017-11-07 NOTE — Progress Notes (Signed)
BP 123/74   Pulse 70   Temp (!) 97.4 F (36.3 C) (Oral)   Ht _0  (1.6 m)   Wt 193 lb (87.5 kg)   BMI 34.19 kg/m    Subjective:    Patient ID: Laura Dalton, female    DOB: 1951/01/14, 67 y.o.   MRN: [id number]  HPI: Laura Dalton is a 67 y.o. female presenting on 11/07/2017 for Diabetes (3 month follow up)   HPI Type 2 diabetes mellitus Patient comes in today for recheck of his diabetes. Patient has been currently taking metformin 500. Patient is currently on an ACE inhibitor/ARB. Patient has not seen an ophthalmologist this year.  Patient has peripheral neuropathy and complains that in the afternoons it flares up.  Patient currently takes amitriptyline in the evening and gabapentin 600-3 times daily  Hypertension Patient is currently on amlodipine, and their blood pressure today is 123/74. Patient denies any lightheadedness or dizziness. Patient denies headaches, blurred vision, chest pains, shortness of breath, or weakness. Denies any side effects from medication and is content with current medication.  Patient complains that she has a slight leg swelling on the right side, it comes and goes and she denies any pain or fevers or warmth with it.  Hyperlipidemia Patient is coming in for recheck of his hyperlipidemia. The patient is currently taking Lipitor. They deny any issues with myalgias or history of liver damage from it. They deny any focal numbness or weakness or chest pain.   Hypothyroidism recheck Patient is coming in for thyroid recheck today as well. They deny any issues with hair changes or heat or cold problems or diarrhea or constipation. They deny any chest pain or palpitations. They are currently on levothyroxine 88 micrograms   Relevant past medical, surgical, family and social history reviewed and updated as indicated. Interim medical history since our last visit reviewed. Allergies and medications reviewed and updated.  Review of Systems  Constitutional:  Negative for chills and fever.  Eyes: Negative for redness and visual disturbance.  Respiratory: Negative for chest tightness and shortness of breath.   Cardiovascular: Positive for leg swelling. Negative for chest pain.  Musculoskeletal: Negative for back pain and gait problem.  Skin: Negative for rash.  Neurological: Positive for numbness (And tingling and burning, worse in the right foot than left but is in both). Negative for light-headedness and headaches.  Psychiatric/Behavioral: Negative for agitation and behavioral problems.  All other systems reviewed and are negative.   Per HPI unless specifically indicated above   Allergies as of 11/07/2017      Reactions   Aspirin Hives      Medication List        Accurate as of 11/07/17 11:19 AM. Always use your most recent med list.          amitriptyline 100 MG tablet Commonly known as:  ELAVIL Take 1 tablet (100 mg total) by mouth at bedtime.   amLODipine 2.5 MG tablet Commonly known as:  NORVASC Take 1 tablet (2.5 mg total) by mouth daily.   atorvastatin 40 MG tablet Commonly known as:  LIPITOR Take 1 tablet (40 mg total) by mouth daily.   calcium carbonate 1500 (600 Ca) MG Tabs tablet Commonly known as:  OSCAL Take by mouth 2 (two) times daily with a meal.   cholecalciferol 400 units Tabs tablet Commonly known as:  VITAMIN D Take 2,000 Units by mouth.   clopidogrel 75 MG tablet Commonly known as:  PLAVIX Take 1  tablet (75 mg total) by mouth daily.   gabapentin 600 MG tablet Commonly known as:  NEURONTIN Take 1 tablet (600 mg total) by mouth 3 (three) times daily. 1 tablet by mouth 4 times daily   levothyroxine 88 MCG tablet Commonly known as:  SYNTHROID, LEVOTHROID Take 1 tablet (88 mcg total) by mouth daily.   losartan-hydrochlorothiazide 100-25 MG tablet Commonly known as:  HYZAAR Take 1 tablet by mouth daily.   meloxicam 15 MG tablet Commonly known as:  MOBIC Take 1 tablet (15 mg total) by mouth  daily.   metFORMIN 500 MG tablet Commonly known as:  GLUCOPHAGE Take 1 tablet (500 mg total) by mouth daily.   methocarbamol 500 MG tablet Commonly known as:  ROBAXIN TAKE ONE TABLET BY MOUTH EVERY 6 HOURS AS NEEDED FOR MUSCLE SPASM   PRESERVISION AREDS 2 Caps Take by mouth.   PRESERVISION AREDS 2+MULTI VIT PO Take by mouth.   traMADol 50 MG tablet Commonly known as:  ULTRAM Take 1 tablet (50 mg total) by mouth every 12 (twelve) hours as needed.          Objective:    BP 123/74   Pulse 70   Temp (!) 97.4 F (36.3 C) (Oral)   Ht _0  (1.6 m)   Wt 193 lb (87.5 kg)   BMI 34.19 kg/m   Wt Readings from Last 3 Encounters:  11/07/17 193 lb (87.5 kg)  10/23/17 192 lb (87.1 kg)  09/03/17 196 lb (88.9 kg)    Physical Exam  Constitutional: She is oriented to person, place, and time. She appears well-developed and well-nourished. No distress.  Eyes: Conjunctivae are normal.  Neck: Neck supple. No thyromegaly present.  Cardiovascular: Normal rate, regular rhythm, normal heart sounds and intact distal pulses.  No murmur heard. Pulmonary/Chest: Effort normal and breath sounds normal. No respiratory distress. She has no wheezes.  Musculoskeletal: Normal range of motion. She exhibits edema (Trace edema in right lower extremity).  Lymphadenopathy:    She has no cervical adenopathy.  Neurological: She is alert and oriented to person, place, and time. Coordination normal.  Skin: Skin is warm and dry. No rash noted. She is not diaphoretic.  Psychiatric: She has a normal mood and affect. Her behavior is normal.  Nursing note and vitals reviewed.       Assessment & Plan:   Problem List Items Addressed This Visit      Cardiovascular and Mediastinum   Hypertension associated with diabetes ([place])   Relevant Medications   amLODipine (NORVASC) 2.5 MG tablet   metFORMIN (GLUCOPHAGE) 500 MG tablet   Other Relevant Orders   CBC with Differential/Platelet   CMP14+EGFR      Endocrine   DM (diabetes mellitus) (HCC)   Relevant Medications   metFORMIN (GLUCOPHAGE) 500 MG tablet   Other Relevant Orders   Bayer DCA Hb A1c Waived   CMP14+EGFR   Hyperlipidemia associated with type 2 diabetes mellitus (HCC)   Relevant Medications   amLODipine (NORVASC) 2.5 MG tablet   metFORMIN (GLUCOPHAGE) 500 MG tablet   Other Relevant Orders   Lipid panel   Diabetic peripheral neuropathy associated with type 2 diabetes mellitus ([place]) - Primary   Relevant Medications   amitriptyline (ELAVIL) 100 MG tablet   gabapentin (NEURONTIN) 600 MG tablet   metFORMIN (GLUCOPHAGE) 500 MG tablet   Other Relevant Orders   CBC with Differential/Platelet   Hypothyroidism   Relevant Orders   CBC with Differential/Platelet   TSH  We increased gabapentin to 600 mg 4 times daily, she can take the fourth dose whenever she feels like she has the worst time a day with it.  We discussed the possibility of a DVT ultrasound but patient declines and would rather do elevation and thinks it is more positional in the way she sits because she has sciatic pain on the other side of her hip.  Continue amitriptyline and metformin, will check blood work today Follow up plan: Return in about 3 months (around 02/07/2018), or if symptoms worsen or fail to improve, for Diabetes with neuropathy and thyroid recheck.  Counseling provided for all of the vaccine components Orders Placed This Encounter  Procedures  . Bayer DCA Hb A1c Waived  . CBC with Differential/Platelet  . CMP14+EGFR  . Lipid panel  . TSH    Caryl Pina, MD [facility] 11/07/2017, 11:19 AM

## 2017-11-07 NOTE — Addendum Note (Signed)
Addended by: Angela AdamOSTOSKY, JESSICA C on: 11/07/2017 02:46 PM   Modules accepted: Orders

## 2017-11-08 LAB — CBC WITH DIFFERENTIAL/PLATELET
BASOS: 0 %
Basophils Absolute: 0 10*3/uL (ref 0.0–0.2)
EOS (ABSOLUTE): 0.6 10*3/uL — ABNORMAL HIGH (ref 0.0–0.4)
EOS: 7 %
HEMATOCRIT: 36.1 % (ref 34.0–46.6)
Hemoglobin: 12.1 g/dL (ref 11.1–15.9)
IMMATURE GRANS (ABS): 0 10*3/uL (ref 0.0–0.1)
IMMATURE GRANULOCYTES: 0 %
LYMPHS: 15 %
Lymphocytes Absolute: 1.2 10*3/uL (ref 0.7–3.1)
MCH: 27.1 pg (ref 26.6–33.0)
MCHC: 33.5 g/dL (ref 31.5–35.7)
MCV: 81 fL (ref 79–97)
MONOS ABS: 0.8 10*3/uL (ref 0.1–0.9)
Monocytes: 10 %
NEUTROS ABS: 5.6 10*3/uL (ref 1.4–7.0)
Neutrophils: 68 %
Platelets: 314 10*3/uL (ref 150–450)
RBC: 4.46 x10E6/uL (ref 3.77–5.28)
RDW: 13.2 % (ref 12.3–15.4)
WBC: 8.2 10*3/uL (ref 3.4–10.8)

## 2017-11-08 LAB — CMP14+EGFR
ALT: 5 IU/L (ref 0–32)
AST: 15 IU/L (ref 0–40)
Albumin/Globulin Ratio: 1.7 (ref 1.2–2.2)
Albumin: 4.5 g/dL (ref 3.6–4.8)
Alkaline Phosphatase: 75 IU/L (ref 39–117)
BILIRUBIN TOTAL: 0.3 mg/dL (ref 0.0–1.2)
BUN / CREAT RATIO: 13 (ref 12–28)
BUN: 10 mg/dL (ref 8–27)
CALCIUM: 9.9 mg/dL (ref 8.7–10.3)
CHLORIDE: 100 mmol/L (ref 96–106)
CO2: 29 mmol/L (ref 20–29)
Creatinine, Ser: 0.76 mg/dL (ref 0.57–1.00)
GFR, EST AFRICAN AMERICAN: 93 mL/min/{1.73_m2} (ref >59)
GFR, EST NON AFRICAN AMERICAN: 81 mL/min/{1.73_m2} (ref >59)
GLOBULIN, TOTAL: 2.6 g/dL (ref 1.5–4.5)
Glucose: 105 mg/dL — ABNORMAL HIGH (ref 65–99)
Potassium: 4.4 mmol/L (ref 3.5–5.2)
SODIUM: 145 mmol/L — AB (ref 134–144)
TOTAL PROTEIN: 7.1 g/dL (ref 6.0–8.5)

## 2017-11-08 LAB — FECAL OCCULT BLOOD, IMMUNOCHEMICAL: FECAL OCCULT BLD: NEGATIVE

## 2017-11-08 LAB — LIPID PANEL
CHOL/HDL RATIO: 3.1 ratio (ref 0.0–4.4)
Cholesterol, Total: 118 mg/dL (ref 100–199)
HDL: 38 mg/dL — AB (ref >39)
LDL Calculated: 55 mg/dL (ref 0–99)
Triglycerides: 124 mg/dL (ref 0–149)
VLDL Cholesterol Cal: 25 mg/dL (ref 5–40)

## 2017-11-08 LAB — TSH: TSH: 1.56 u[IU]/mL (ref 0.450–4.500)

## 2017-11-21 ENCOUNTER — Other Ambulatory Visit: Payer: Self-pay | Admitting: Family Medicine

## 2017-11-21 DIAGNOSIS — E1142 Type 2 diabetes mellitus with diabetic polyneuropathy: Secondary | ICD-10-CM

## 2018-01-07 LAB — HM DIABETES EYE EXAM

## 2018-01-26 ENCOUNTER — Other Ambulatory Visit: Payer: Self-pay | Admitting: Family Medicine

## 2018-01-26 DIAGNOSIS — G8929 Other chronic pain: Secondary | ICD-10-CM

## 2018-01-26 DIAGNOSIS — M549 Dorsalgia, unspecified: Principal | ICD-10-CM

## 2018-02-05 ENCOUNTER — Other Ambulatory Visit: Payer: Self-pay | Admitting: Family Medicine

## 2018-02-05 DIAGNOSIS — E1159 Type 2 diabetes mellitus with other circulatory complications: Secondary | ICD-10-CM

## 2018-02-05 DIAGNOSIS — I10 Essential (primary) hypertension: Principal | ICD-10-CM

## 2018-03-15 ENCOUNTER — Other Ambulatory Visit: Payer: Self-pay | Admitting: Family Medicine

## 2018-03-15 DIAGNOSIS — M549 Dorsalgia, unspecified: Secondary | ICD-10-CM

## 2018-03-15 DIAGNOSIS — E785 Hyperlipidemia, unspecified: Secondary | ICD-10-CM

## 2018-03-15 DIAGNOSIS — E1169 Type 2 diabetes mellitus with other specified complication: Secondary | ICD-10-CM

## 2018-03-15 DIAGNOSIS — G8929 Other chronic pain: Secondary | ICD-10-CM

## 2018-03-25 ENCOUNTER — Other Ambulatory Visit: Payer: Self-pay | Admitting: Family Medicine

## 2018-03-25 DIAGNOSIS — E785 Hyperlipidemia, unspecified: Secondary | ICD-10-CM

## 2018-03-25 DIAGNOSIS — E1169 Type 2 diabetes mellitus with other specified complication: Secondary | ICD-10-CM

## 2018-04-03 ENCOUNTER — Other Ambulatory Visit: Payer: Self-pay | Admitting: Family Medicine

## 2018-04-04 NOTE — Telephone Encounter (Signed)
Last seen 11/08/17

## 2018-04-30 ENCOUNTER — Other Ambulatory Visit: Payer: Self-pay | Admitting: Family Medicine

## 2018-04-30 DIAGNOSIS — M549 Dorsalgia, unspecified: Principal | ICD-10-CM

## 2018-04-30 DIAGNOSIS — E039 Hypothyroidism, unspecified: Secondary | ICD-10-CM

## 2018-04-30 DIAGNOSIS — G8929 Other chronic pain: Secondary | ICD-10-CM

## 2018-05-14 ENCOUNTER — Other Ambulatory Visit: Payer: Self-pay | Admitting: Family Medicine

## 2018-05-30 ENCOUNTER — Ambulatory Visit: Payer: PPO | Admitting: Cardiology

## 2018-06-20 ENCOUNTER — Emergency Department (HOSPITAL_COMMUNITY)
Admission: EM | Admit: 2018-06-20 | Discharge: 2018-06-20 | Disposition: A | Discharge status: Home / Self Care | Payer: PPO | Attending: Emergency Medicine | Admitting: Emergency Medicine

## 2018-06-20 ENCOUNTER — Encounter (HOSPITAL_COMMUNITY): Payer: Self-pay

## 2018-06-20 ENCOUNTER — Other Ambulatory Visit: Payer: Self-pay

## 2018-06-20 ENCOUNTER — Emergency Department (HOSPITAL_COMMUNITY): Payer: PPO

## 2018-06-20 DIAGNOSIS — J432 Centrilobular emphysema: Secondary | ICD-10-CM | POA: Diagnosis not present

## 2018-06-20 DIAGNOSIS — Y939 Activity, unspecified: Secondary | ICD-10-CM | POA: Insufficient documentation

## 2018-06-20 DIAGNOSIS — R599 Enlarged lymph nodes, unspecified: Secondary | ICD-10-CM | POA: Insufficient documentation

## 2018-06-20 DIAGNOSIS — I2510 Atherosclerotic heart disease of native coronary artery without angina pectoris: Secondary | ICD-10-CM | POA: Diagnosis not present

## 2018-06-20 DIAGNOSIS — Z87891 Personal history of nicotine dependence: Secondary | ICD-10-CM | POA: Diagnosis not present

## 2018-06-20 DIAGNOSIS — E039 Hypothyroidism, unspecified: Secondary | ICD-10-CM | POA: Diagnosis not present

## 2018-06-20 DIAGNOSIS — W19XXXA Unspecified fall, initial encounter: Secondary | ICD-10-CM

## 2018-06-20 DIAGNOSIS — R0781 Pleurodynia: Secondary | ICD-10-CM | POA: Insufficient documentation

## 2018-06-20 DIAGNOSIS — Z79899 Other long term (current) drug therapy: Secondary | ICD-10-CM | POA: Insufficient documentation

## 2018-06-20 DIAGNOSIS — S0990XA Unspecified injury of head, initial encounter: Secondary | ICD-10-CM

## 2018-06-20 DIAGNOSIS — M810 Age-related osteoporosis without current pathological fracture: Secondary | ICD-10-CM | POA: Diagnosis not present

## 2018-06-20 DIAGNOSIS — E119 Type 2 diabetes mellitus without complications: Secondary | ICD-10-CM | POA: Insufficient documentation

## 2018-06-20 DIAGNOSIS — N200 Calculus of kidney: Secondary | ICD-10-CM | POA: Diagnosis not present

## 2018-06-20 DIAGNOSIS — Y999 Unspecified external cause status: Secondary | ICD-10-CM | POA: Diagnosis not present

## 2018-06-20 DIAGNOSIS — E785 Hyperlipidemia, unspecified: Secondary | ICD-10-CM | POA: Diagnosis not present

## 2018-06-20 DIAGNOSIS — M47812 Spondylosis without myelopathy or radiculopathy, cervical region: Secondary | ICD-10-CM | POA: Diagnosis not present

## 2018-06-20 DIAGNOSIS — R1011 Right upper quadrant pain: Secondary | ICD-10-CM | POA: Diagnosis not present

## 2018-06-20 DIAGNOSIS — M47816 Spondylosis without myelopathy or radiculopathy, lumbar region: Secondary | ICD-10-CM | POA: Diagnosis not present

## 2018-06-20 DIAGNOSIS — R296 Repeated falls: Secondary | ICD-10-CM | POA: Insufficient documentation

## 2018-06-20 DIAGNOSIS — E7800 Pure hypercholesterolemia, unspecified: Secondary | ICD-10-CM | POA: Diagnosis not present

## 2018-06-20 DIAGNOSIS — J449 Chronic obstructive pulmonary disease, unspecified: Secondary | ICD-10-CM | POA: Insufficient documentation

## 2018-06-20 DIAGNOSIS — R0789 Other chest pain: Secondary | ICD-10-CM | POA: Diagnosis not present

## 2018-06-20 DIAGNOSIS — Z7984 Long term (current) use of oral hypoglycemic drugs: Secondary | ICD-10-CM | POA: Insufficient documentation

## 2018-06-20 DIAGNOSIS — S0003XA Contusion of scalp, initial encounter: Secondary | ICD-10-CM | POA: Diagnosis not present

## 2018-06-20 DIAGNOSIS — Z7902 Long term (current) use of antithrombotics/antiplatelets: Secondary | ICD-10-CM | POA: Insufficient documentation

## 2018-06-20 DIAGNOSIS — W0110XA Fall on same level from slipping, tripping and stumbling with subsequent striking against unspecified object, initial encounter: Secondary | ICD-10-CM | POA: Diagnosis not present

## 2018-06-20 DIAGNOSIS — M1990 Unspecified osteoarthritis, unspecified site: Secondary | ICD-10-CM | POA: Diagnosis not present

## 2018-06-20 DIAGNOSIS — I10 Essential (primary) hypertension: Secondary | ICD-10-CM | POA: Diagnosis not present

## 2018-06-20 DIAGNOSIS — D649 Anemia, unspecified: Secondary | ICD-10-CM | POA: Diagnosis not present

## 2018-06-20 DIAGNOSIS — Y929 Unspecified place or not applicable: Secondary | ICD-10-CM | POA: Diagnosis not present

## 2018-06-20 DIAGNOSIS — M179 Osteoarthritis of knee, unspecified: Secondary | ICD-10-CM | POA: Diagnosis not present

## 2018-06-20 DIAGNOSIS — I480 Paroxysmal atrial fibrillation: Secondary | ICD-10-CM | POA: Diagnosis not present

## 2018-06-20 DIAGNOSIS — D509 Iron deficiency anemia, unspecified: Secondary | ICD-10-CM | POA: Diagnosis not present

## 2018-06-20 DIAGNOSIS — M8580 Other specified disorders of bone density and structure, unspecified site: Secondary | ICD-10-CM | POA: Diagnosis not present

## 2018-06-20 DIAGNOSIS — F324 Major depressive disorder, single episode, in partial remission: Secondary | ICD-10-CM | POA: Diagnosis not present

## 2018-06-20 LAB — CBC WITH DIFFERENTIAL/PLATELET
Abs Immature Granulocytes: 0.05 10*3/uL (ref 0.00–0.07)
Basophils Absolute: 0 10*3/uL (ref 0.0–0.1)
Basophils Relative: 0 %
Eosinophils Absolute: 0.5 10*3/uL (ref 0.0–0.5)
Eosinophils Relative: 4 %
HCT: 39.6 % (ref 36.0–46.0)
Hemoglobin: 12.4 g/dL (ref 12.0–15.0)
Immature Granulocytes: 0 %
Lymphocytes Relative: 13 %
Lymphs Abs: 1.5 10*3/uL (ref 0.7–4.0)
MCH: 27 pg (ref 26.0–34.0)
MCHC: 31.3 g/dL (ref 30.0–36.0)
MCV: 86.1 fL (ref 80.0–100.0)
Monocytes Absolute: 1.1 10*3/uL — ABNORMAL HIGH (ref 0.1–1.0)
Monocytes Relative: 9 %
Neutro Abs: 8.5 10*3/uL — ABNORMAL HIGH (ref 1.7–7.7)
Neutrophils Relative %: 74 %
Platelets: 332 10*3/uL (ref 150–400)
RBC: 4.6 MIL/uL (ref 3.87–5.11)
RDW: 12.8 % (ref 11.5–15.5)
WBC: 11.5 10*3/uL — ABNORMAL HIGH (ref 4.0–10.5)
nRBC: 0 % (ref 0.0–0.2)

## 2018-06-20 LAB — COMPREHENSIVE METABOLIC PANEL
ALT: 9 U/L (ref 0–44)
AST: 18 U/L (ref 15–41)
Albumin: 4.5 g/dL (ref 3.5–5.0)
Alkaline Phosphatase: 82 U/L (ref 38–126)
Anion gap: 10 (ref 5–15)
BUN: 12 mg/dL (ref 8–23)
CO2: 27 mmol/L (ref 22–32)
Calcium: 9.4 mg/dL (ref 8.9–10.3)
Chloride: 103 mmol/L (ref 98–111)
Creatinine, Ser: 0.74 mg/dL (ref 0.44–1.00)
GFR calc Af Amer: >60 mL/min (ref >60)
GFR calc non Af Amer: >60 mL/min (ref >60)
Glucose, Bld: 104 mg/dL — ABNORMAL HIGH (ref 70–99)
Potassium: 3.5 mmol/L (ref 3.5–5.1)
Sodium: 140 mmol/L (ref 135–145)
Total Bilirubin: 0.4 mg/dL (ref 0.3–1.2)
Total Protein: 7.9 g/dL (ref 6.5–8.1)

## 2018-06-20 MED ORDER — LIDOCAINE 5 % EX OINT: 1.0000 "application " | TOPICAL_OINTMENT | Freq: Three times a day (TID) | CUTANEOUS | 0 refills | Status: DC | PRN

## 2018-06-20 MED ORDER — IOHEXOL 300 MG/ML  SOLN
100.0000 mL | Freq: Once | INTRAMUSCULAR | Status: AC | PRN
  Administered 2018-06-20: 100 mL via INTRAVENOUS

## 2018-06-20 MED ORDER — MORPHINE SULFATE (PF) 4 MG/ML IV SOLN
4.0000 mg | Freq: Once | INTRAVENOUS | Status: AC
  Administered 2018-06-20: 4 mg via INTRAVENOUS
  Filled 2018-06-20: qty 1

## 2018-06-20 MED ORDER — FENTANYL CITRATE (PF) 100 MCG/2ML IJ SOLN
50.0000 ug | Freq: Once | INTRAMUSCULAR | Status: AC
  Administered 2018-06-20: 50 ug via INTRAVENOUS
  Filled 2018-06-20: qty 2

## 2018-06-20 NOTE — Discharge Instructions (Signed)
You were seen in the emergency department 2-day after fall.  You have no broken bones or other severe injuries after fall.  Use the incentive spirometer to keep your lungs nice and open.  You can use Tylenol for pain along with your home tramadol.  You can also apply lidocaine ointment to the painful area as directed.  Your CT scan does show some enlarged lymph nodes on your chest CT.  Please mention this to your PCP and they can discuss with you follow-up imaging as needed.

## 2018-06-20 NOTE — ED Triage Notes (Signed)
Pt brought to ED following a fall at home where she lost balance and fell. Pt states she hit her head on the floor, denies LOC. Pt states she is on Plavix. Pt also having pain right rib cage area.

## 2018-06-20 NOTE — ED Provider Notes (Signed)
[facility]Y [facility] Provider Note   CSN: [id number] Arrival date & time: 06/20/18  1756    History   Chief Complaint Chief Complaint  Patient presents with  . Fall    HPI Maryann AlarVerna Dianne Januszewski is a 68 y.o. female.     HPI Patient presents after a fall.  States she has had a few falls over the last month most which related to her dog.  States today she lost her balance and fell backwards.  States she hit her head.  Pain in the back of her head with headache.  Also pain in her right ribs.  States she thinks she may have broken ribs.  Does hurt to breathe but is not short of breath.  Pain is in the lower right side.  She is on Plavix. Past Medical History:  Diagnosis Date  . Bursitis of hip   . Diabetes mellitus without complication (HCC)    type II  . Hypertension   . Hypothyroidism   . Low back pain    benign turmor back  . Stroke (HCC)   . Vitamin D deficiency disease     Patient Active Problem List   Diagnosis Date Noted  . Obesity (BMI 30.0-34.9) 03/12/2015  . Hypothyroidism 07/29/2014  . Osteoarthritis of right shoulder 01/21/2014  . Leg length discrepancy 11/04/2013  . Lumbar post-laminectomy syndrome 11/04/2013  . Diabetic peripheral neuropathy associated with type 2 diabetes mellitus (HCC) 11/04/2013  . COPD (chronic obstructive pulmonary disease) (HCC) 10/01/2012  . Chronic back pain 10/01/2012  . Hypertension associated with diabetes (HCC) 10/01/2012  . DM (diabetes mellitus) (HCC) 10/01/2012  . Vitamin D deficiency 10/01/2012  . Hyperlipidemia associated with type 2 diabetes mellitus (HCC) 10/01/2012  . Tinea unguium 10/01/2012    Past Surgical History:  Procedure Laterality Date  . APPENDECTOMY    . BACK SURGERY  1996   turmor on back   . CHOLECYSTECTOMY    . LEG SURGERY Right    "clot and had drain in leg  rt lower leg     OB History   No obstetric history on file.      Home Medications    Prior to Admission medications    Medication Sig Start Date End Date Taking? Authorizing Provider  amitriptyline (ELAVIL) 100 MG tablet Take 1 tablet (100 mg total) by mouth at bedtime. 11/07/17  Yes Dettinger, Elige RadonJoshua A, MD  amLODipine (NORVASC) 2.5 MG tablet Take 1 tablet (2.5 mg total) by mouth daily. 11/07/17  Yes Dettinger, Elige RadonJoshua A, MD  atorvastatin (LIPITOR) 40 MG tablet TAKE 1 TABLET BY MOUTH ONCE DAILY Patient taking differently: Take 40 mg by mouth every morning.  03/25/18  Yes Dettinger, Elige RadonJoshua A, MD  calcium carbonate (OSCAL) 1500 (600 Ca) MG TABS tablet Take by mouth 2 (two) times daily with a meal.   Yes [provider]  Cholecalciferol 25 MCG (1000 UT) tablet Take 2,000 Units by mouth daily.    Yes [provider]  clopidogrel (PLAVIX) 75 MG tablet TAKE 1 TABLET BY MOUTH ONCE DAILY Patient taking differently: Take 75 mg by mouth daily.  04/04/18  Yes Dettinger, Elige RadonJoshua A, MD  gabapentin (NEURONTIN) 600 MG tablet TAKE 1 TABLET BY MOUTH THREE TIMES DAILY Patient taking differently: Take 600 mg by mouth 4 (four) times daily.  11/21/17  Yes Dettinger, Elige RadonJoshua A, MD  levothyroxine (SYNTHROID, LEVOTHROID) 88 MCG tablet TAKE 1 TABLET BY MOUTH ONCE DAILY Patient taking differently: Take 88 mcg by mouth daily before  breakfast.  04/30/18  Yes Dettinger, Elige Radon, MD  metFORMIN (GLUCOPHAGE) 500 MG tablet Take 1 tablet (500 mg total) by mouth daily. 11/07/17  Yes Dettinger, Elige Radon, MD  methocarbamol (ROBAXIN) 500 MG tablet TAKE ONE TABLET BY MOUTH EVERY 6 HOURS AS NEEDED FOR MUSCLE SPASM Patient taking differently: Take 500 mg by mouth every 6 (six) hours as needed for muscle spasms.  05/03/17  Yes Dettinger, Elige Radon, MD  traMADol (ULTRAM) 50 MG tablet Take 1 tablet (50 mg total) by mouth every 12 (twelve) hours as needed. Patient taking differently: Take 50 mg by mouth every 12 (twelve) hours as needed for moderate pain.  11/07/17  Yes Dettinger, Elige Radon, MD  vitamin C (ASCORBIC ACID) 500 MG tablet Take 500 mg by  mouth daily.   Yes [provider]  meloxicam (MOBIC) 15 MG tablet TAKE 1 TABLET BY MOUTH ONCE DAILY NEEDS  APPOINTMETNT Patient not taking: Reported on 06/20/2018 05/01/18   Dettinger, Elige Radon, MD    Family History Family History  Problem Relation Age of Onset  . Diabetes Mother   . Stroke Mother   . Seizures Mother   . Cancer Father        prostate  . Thyroid disease Son   . Hashimoto's thyroiditis Son   . COPD Sister   . Cancer Brother        lung  . COPD Sister   . COPD Sister   . Cancer Brother        prostate  . COPD Brother   . COPD Brother     Social History Social History   Tobacco Use  . Smoking status: Former Smoker    Packs/day: 0.50    Years: 45.00    Pack years: 22.50    Types: Cigarettes    Start date: 08/15/1962    Last attempt to quit: 12/01/2007    Years since quitting: 10.5  . Smokeless tobacco: Never Used  Substance Use Topics  . Alcohol use: No    Alcohol/week: 0.0 standard drinks  . Drug use: No     Allergies   Aspirin   Review of Systems Review of Systems  Constitutional: Negative for appetite change.  HENT: Negative for congestion.   Respiratory: Negative for shortness of breath.   Cardiovascular: Positive for chest pain.  Gastrointestinal: Positive for abdominal pain.  Genitourinary: Negative for flank pain.  Musculoskeletal: Negative for back pain.  Skin: Negative for rash.  Neurological: Positive for headaches.  Hematological: Bruises/bleeds easily.  Psychiatric/Behavioral: Negative for confusion.     Physical Exam Updated Vital Signs BP (!) 146/78 (BP Location: Right Arm)   Pulse 79   Temp 98.4 F (36.9 C) (Oral)   Resp 18   Ht 5\' 7"  (1.702 m)   Wt 88.5 kg   SpO2 98%   BMI 30.54 kg/m   Physical Exam Vitals signs and nursing note reviewed.  HENT:     Head:     Comments: Tenderness to occipital area.  Mild swelling.    Mouth/Throat:     Mouth: Mucous membranes are moist.  Eyes:     Pupils: Pupils are  equal, round, and reactive to light.  Neck:     Musculoskeletal: Neck supple.     Comments: No midline tenderness and painless range of motion. Cardiovascular:     Rate and Rhythm: Normal rate.  Pulmonary:     Comments: Tenderness over right lower anterior and lateral chest wall.  No crepitance.  No  subcu emphysema. Abdominal:     Tenderness: There is abdominal tenderness.     Comments: Right upper quadrant tenderness.  No ecchymosis.  Musculoskeletal:        General: No tenderness.  Skin:    General: Skin is warm.     Capillary Refill: Capillary refill takes less than 2 seconds.  Neurological:     General: No focal deficit present.     Mental Status: She is alert and oriented to person, place, and time.  Psychiatric:        Mood and Affect: Mood normal.      ED Treatments / Results  Labs (all labs ordered are listed, but only abnormal results are displayed) Labs Reviewed  CBC WITH DIFFERENTIAL/PLATELET - Abnormal; Notable for the following components:      Result Value   WBC 11.5 (*)    Neutro Abs 8.5 (*)    Monocytes Absolute 1.1 (*)    All other components within normal limits  COMPREHENSIVE METABOLIC PANEL    EKG None  Radiology No results found.  Procedures Procedures (including critical care time)  Medications Ordered in ED Medications  fentaNYL (SUBLIMAZE) injection 50 mcg (has no administration in time range)     Initial Impression / Assessment and Plan / ED Course  I have reviewed the triage vital signs and the nursing notes.  Pertinent labs & imaging results that were available during my care of the patient were reviewed by me and considered in my medical decision making (see chart for details).        Patient with fall on Plavix.  Will get head CT and chest and abdominal CT due to rib pain and upper abdominal pain.  Care turned over to Dr. Jacqulyn Bath.  Final Clinical Impressions(s) / ED Diagnoses   Final diagnoses:  Fall, initial encounter     ED Discharge Orders    None       Benjiman Core, MD 06/20/18 1858

## 2018-06-20 NOTE — ED Provider Notes (Signed)
Blood pressure (!) 146/78, pulse 79, temperature 98.4 F (36.9 C), temperature source Oral, resp. rate 18, height 5\' 7"  (1.702 m), weight 88.5 kg, SpO2 98 %.  Assuming care from Dr. Rubin Payor.  In short, Laura Dalton is a 68 y.o. female with a chief complaint of Fall .  Refer to the original H&P for additional details.  The current plan of care is to follow up on CT imaging and reassess.  09:00 PM  Patient CT scan reviewed.  No acute traumatic findings.  I discussed the lymphadenopathy with the patient and provided this in the discharge paperwork as well.  She plans discussed with her PCP.  Patient was given an incentive spirometer and lidocaine ointment for pain management at home.  Vital signs remained normal.  Patient with normal mental status.  Discussed emergency department return precautions in detail.   Maia Plan, MD 06/20/18 2105

## 2018-07-02 ENCOUNTER — Other Ambulatory Visit: Payer: Self-pay | Admitting: Family Medicine

## 2018-07-02 NOTE — Telephone Encounter (Signed)
Last seen 11/07/17

## 2018-07-24 ENCOUNTER — Other Ambulatory Visit: Payer: Self-pay | Admitting: *Deleted

## 2018-07-31 ENCOUNTER — Other Ambulatory Visit: Payer: Self-pay

## 2018-07-31 ENCOUNTER — Telehealth: Payer: Self-pay | Admitting: Family Medicine

## 2018-07-31 NOTE — Telephone Encounter (Signed)
Aware.  Patient suffers with recurrent bursitis and is allowed to keep her office appointment.

## 2018-08-01 ENCOUNTER — Encounter: Payer: Self-pay | Admitting: Family Medicine

## 2018-08-01 ENCOUNTER — Other Ambulatory Visit: Payer: Self-pay

## 2018-08-01 ENCOUNTER — Ambulatory Visit (INDEPENDENT_AMBULATORY_CARE_PROVIDER_SITE_OTHER): Payer: PPO | Admitting: Family Medicine

## 2018-08-01 VITALS — BP 147/76 | HR 85 | Temp 97.0°F | Ht 67.0 in | Wt 194.6 lb

## 2018-08-01 DIAGNOSIS — L209 Atopic dermatitis, unspecified: Secondary | ICD-10-CM

## 2018-08-01 DIAGNOSIS — G8929 Other chronic pain: Secondary | ICD-10-CM | POA: Diagnosis not present

## 2018-08-01 DIAGNOSIS — E1142 Type 2 diabetes mellitus with diabetic polyneuropathy: Secondary | ICD-10-CM | POA: Diagnosis not present

## 2018-08-01 DIAGNOSIS — E785 Hyperlipidemia, unspecified: Secondary | ICD-10-CM | POA: Diagnosis not present

## 2018-08-01 DIAGNOSIS — M545 Low back pain: Secondary | ICD-10-CM

## 2018-08-01 DIAGNOSIS — E1159 Type 2 diabetes mellitus with other circulatory complications: Secondary | ICD-10-CM | POA: Diagnosis not present

## 2018-08-01 DIAGNOSIS — E1169 Type 2 diabetes mellitus with other specified complication: Secondary | ICD-10-CM

## 2018-08-01 DIAGNOSIS — E039 Hypothyroidism, unspecified: Secondary | ICD-10-CM

## 2018-08-01 DIAGNOSIS — I10 Essential (primary) hypertension: Secondary | ICD-10-CM | POA: Diagnosis not present

## 2018-08-01 DIAGNOSIS — M549 Dorsalgia, unspecified: Secondary | ICD-10-CM

## 2018-08-01 LAB — URINALYSIS, COMPLETE
Bilirubin, UA: NEGATIVE
Glucose, UA: NEGATIVE
Ketones, UA: NEGATIVE
Nitrite, UA: NEGATIVE
Protein,UA: NEGATIVE
RBC, UA: NEGATIVE
Specific Gravity, UA: 1.015 (ref 1.005–1.030)
Urobilinogen, Ur: 0.2 mg/dL (ref 0.2–1.0)
pH, UA: 7 (ref 5.0–7.5)

## 2018-08-01 LAB — BAYER DCA HB A1C WAIVED: HB A1C (BAYER DCA - WAIVED): 6.4 % (ref <7.0)

## 2018-08-01 LAB — MICROSCOPIC EXAMINATION
RBC, Urine: NONE SEEN /hpf (ref 0–2)
Renal Epithel, UA: NONE SEEN /hpf

## 2018-08-01 MED ORDER — METFORMIN HCL 500 MG PO TABS: 500.0000 mg | ORAL_TABLET | Freq: Every day | ORAL | 3 refills | Status: DC

## 2018-08-01 MED ORDER — AMLODIPINE BESYLATE 2.5 MG PO TABS: 2.5000 mg | ORAL_TABLET | Freq: Every day | ORAL | 3 refills | Status: DC

## 2018-08-01 MED ORDER — GABAPENTIN 600 MG PO TABS: 600.0000 mg | ORAL_TABLET | Freq: Four times a day (QID) | ORAL | 5 refills | Status: DC

## 2018-08-01 MED ORDER — LEVOTHYROXINE SODIUM 88 MCG PO TABS: 88.0000 ug | ORAL_TABLET | Freq: Every day | ORAL | 3 refills | Status: DC

## 2018-08-01 MED ORDER — ATORVASTATIN CALCIUM 40 MG PO TABS: 40.0000 mg | ORAL_TABLET | Freq: Every day | ORAL | 3 refills | Status: DC

## 2018-08-01 MED ORDER — METHOCARBAMOL 500 MG PO TABS: ORAL_TABLET | ORAL | 2 refills | Status: DC

## 2018-08-01 MED ORDER — AMITRIPTYLINE HCL 100 MG PO TABS: 100.0000 mg | ORAL_TABLET | Freq: Every day | ORAL | 3 refills | Status: DC

## 2018-08-01 MED ORDER — CLOPIDOGREL BISULFATE 75 MG PO TABS: 75.0000 mg | ORAL_TABLET | Freq: Every day | ORAL | 3 refills | Status: DC

## 2018-08-01 MED ORDER — MELOXICAM 15 MG PO TABS: ORAL_TABLET | ORAL | 1 refills | Status: DC

## 2018-08-01 MED ORDER — TRIAMCINOLONE ACETONIDE 0.1 % EX CREA: 1.0000 "application " | TOPICAL_CREAM | Freq: Two times a day (BID) | CUTANEOUS | 0 refills | Status: AC

## 2018-08-01 NOTE — Progress Notes (Signed)
BP (!) 147/76   Pulse 85   Temp (!) 97 F (36.1 C) (Oral)   Ht 5' 7"  (1.702 m)   Wt 194 lb 9.6 oz (88.3 kg)   BMI 30.48 kg/m    Subjective:   Patient ID: Laura Dalton, female    DOB: 1951-02-03, 68 y.o.   MRN: [id number]  HPI: Laquiesha Piacente is a 68 y.o. female presenting on 08/01/2018 for Diabetes (3 month follow up- patient states she has enlarged lymph nodes on chest ); Back Pain (Patient states it has been ongoing and would like to be checked for kidney infection); and Rash (bilateral legs x 3 weeks)   HPI Hypothyroidism recheck Patient is coming in for thyroid recheck today as well. They deny any issues with hair changes or heat or cold problems or diarrhea or constipation. They deny any chest pain or palpitations. They are currently on levothyroxine 71mcrograms   Type 2 diabetes mellitus Patient comes in today for recheck of his diabetes. Patient has been currently taking metformin. Patient is not currently on an ACE inhibitor/ARB. Patient has not seen an ophthalmologist this year. Patient denies any issues with their feet.   Hyperlipidemia Patient is coming in for recheck of his hyperlipidemia. The patient is currently taking Lipitor. They deny any issues with myalgias or history of liver damage from it. They deny any focal numbness or weakness or chest pain.   Hypertension Patient is currently on amlodipine, and their blood pressure today is 147/76. Patient denies any lightheadedness or dizziness. Patient denies headaches, blurred vision, chest pains, shortness of breath, or weakness. Denies any side effects from medication and is content with current medication.   Chronic back pain Patient has continued chronic back pain has been worse over the past few months since on both sides bilateral in her belt line area and goes down the back of both of her hamstrings and she describes it as a nerve pain and weakness.  She has been having this for quite some time and it flares  up for months at a time like it has currently.  She is currently taking amitriptyline and gabapentin and Robaxin for and she says the tramadol did nothing for it.  Relevant past medical, surgical, family and social history reviewed and updated as indicated. Interim medical history since our last visit reviewed. Allergies and medications reviewed and updated.  Review of Systems  Constitutional: Negative for chills and fever.  Eyes: Negative for visual disturbance.  Respiratory: Negative for chest tightness and shortness of breath.   Cardiovascular: Negative for chest pain and leg swelling.  Musculoskeletal: Positive for back pain and myalgias. Negative for gait problem.  Skin: Negative for rash.  Neurological: Negative for dizziness, light-headedness and headaches.  Psychiatric/Behavioral: Negative for agitation, behavioral problems, dysphoric mood, self-injury, sleep disturbance and suicidal ideas. The patient is not nervous/anxious.   All other systems reviewed and are negative.   Per HPI unless specifically indicated above   Allergies as of 08/01/2018      Reactions   Aspirin Hives      Medication List       Accurate as of Aug 01, 2018 11:13 AM. If you have any questions, ask your nurse or doctor.        STOP taking these medications   Cholecalciferol 25 MCG (1000 UT) tablet Stopped by:  JFransisca KaufmannDettinger, MD     TAKE these medications   amitriptyline 100 MG tablet Commonly known as:  ELAVIL Take  1 tablet (100 mg total) by mouth at bedtime.   amLODipine 2.5 MG tablet Commonly known as:  NORVASC Take 1 tablet (2.5 mg total) by mouth daily.   atorvastatin 40 MG tablet Commonly known as:  LIPITOR Take 1 tablet (40 mg total) by mouth daily. What changed:  when to take this   calcium carbonate 1500 (600 Ca) MG Tabs tablet Commonly known as:  OSCAL Take by mouth 2 (two) times daily with a meal.   clopidogrel 75 MG tablet Commonly known as:  PLAVIX Take 1 tablet  (75 mg total) by mouth daily.   gabapentin 600 MG tablet Commonly known as:  NEURONTIN Take 1 tablet (600 mg total) by mouth 4 (four) times daily.   levothyroxine 88 MCG tablet Commonly known as:  SYNTHROID Take 1 tablet (88 mcg total) by mouth daily before breakfast.   lidocaine 5 % ointment Commonly known as:  XYLOCAINE Apply 1 application topically 3 (three) times daily as needed.   meloxicam 15 MG tablet Commonly known as:  MOBIC TAKE 1 TABLET BY MOUTH ONCE DAILY What changed:  See the new instructions. Changed by:  Fransisca Kaufmann , MD   metFORMIN 500 MG tablet Commonly known as:  GLUCOPHAGE Take 1 tablet (500 mg total) by mouth daily.   methocarbamol 500 MG tablet Commonly known as:  ROBAXIN TAKE ONE TABLET BY MOUTH EVERY 6 HOURS AS NEEDED FOR MUSCLE SPASM What changed:    how much to take  how to take this  when to take this  reasons to take this  additional instructions   triamcinolone cream 0.1 % Commonly known as:  KENALOG Apply 1 application topically 2 (two) times daily. Started by:  Worthy Rancher, MD   vitamin C 500 MG tablet Commonly known as:  ASCORBIC ACID Take 500 mg by mouth daily.        Objective:   BP (!) 147/76   Pulse 85   Temp (!) 97 F (36.1 C) (Oral)   Ht 5' 7"  (1.702 m)   Wt 194 lb 9.6 oz (88.3 kg)   BMI 30.48 kg/m   Wt Readings from Last 3 Encounters:  08/01/18 194 lb 9.6 oz (88.3 kg)  06/20/18 195 lb (88.5 kg)  11/07/17 193 lb (87.5 kg)    Physical Exam Vitals signs and nursing note reviewed.  Constitutional:      General: She is not in acute distress.    Appearance: She is well-developed. She is not diaphoretic.  Eyes:     Conjunctiva/sclera: Conjunctivae normal.  Cardiovascular:     Rate and Rhythm: Normal rate and regular rhythm.     Heart sounds: Normal heart sounds. No murmur.  Pulmonary:     Effort: Pulmonary effort is normal. No respiratory distress.     Breath sounds: Normal breath sounds. No  wheezing.  Musculoskeletal: Normal range of motion.        General: Tenderness (Bilateral low back pain) present.  Skin:    General: Skin is warm and dry.     Findings: No rash.  Neurological:     Mental Status: She is alert and oriented to person, place, and time.     Coordination: Coordination normal.  Psychiatric:        Behavior: Behavior normal.       Assessment & Plan:   Problem List Items Addressed This Visit      Cardiovascular and Mediastinum   Hypertension associated with diabetes ([place])   Relevant Medications  metFORMIN (GLUCOPHAGE) 500 MG tablet   atorvastatin (LIPITOR) 40 MG tablet   amLODipine (NORVASC) 2.5 MG tablet     Endocrine   DM (diabetes mellitus) (HCC)   Relevant Medications   metFORMIN (GLUCOPHAGE) 500 MG tablet   atorvastatin (LIPITOR) 40 MG tablet   Other Relevant Orders   Bayer DCA Hb A1c Waived   Hyperlipidemia associated with type 2 diabetes mellitus (HCC)   Relevant Medications   metFORMIN (GLUCOPHAGE) 500 MG tablet   atorvastatin (LIPITOR) 40 MG tablet   Hypothyroidism - Primary   Relevant Medications   levothyroxine (SYNTHROID) 88 MCG tablet   Other Relevant Orders   TSH     Nervous and Auditory   Diabetic peripheral neuropathy associated with type 2 diabetes mellitus (HCC)   Relevant Medications   amitriptyline (ELAVIL) 100 MG tablet   gabapentin (NEURONTIN) 600 MG tablet   methocarbamol (ROBAXIN) 500 MG tablet   metFORMIN (GLUCOPHAGE) 500 MG tablet   atorvastatin (LIPITOR) 40 MG tablet   Other Relevant Orders   CBC with Differential/Platelet   CMP14+EGFR     Other   Chronic back pain   Relevant Medications   amitriptyline (ELAVIL) 100 MG tablet   gabapentin (NEURONTIN) 600 MG tablet   meloxicam (MOBIC) 15 MG tablet   methocarbamol (ROBAXIN) 500 MG tablet   Other Relevant Orders   Ambulatory referral to Orthopedic Surgery   Urinalysis, Complete   Ambulatory referral to Orthopedic Surgery    Other Visit Diagnoses     Atopic dermatitis, unspecified type       Relevant Medications   triamcinolone cream (KENALOG) 0.1 %      Recheck thyroid and diabetes and continue current medication  With continued back pain will refer to orthopedics for possible injections to see if she is candidate, she says is been worse around her beltline in her back and going down the back of her legs on both sides over the past few months. Follow up plan: Return in about 3 months (around 11/01/2018), or if symptoms worsen or fail to improve, for Diabetes and thyroid recheck.  Counseling provided for all of the vaccine components Orders Placed This Encounter  Procedures  . Urinalysis, Complete  . CBC with Differential/Platelet  . CMP14+EGFR  . TSH  . Bayer DCA Hb A1c Waived  . Ambulatory referral to [place] , MD [facility] 08/01/2018, 11:13 AM

## 2018-08-02 ENCOUNTER — Telehealth: Payer: Self-pay

## 2018-08-02 LAB — TSH: TSH: 1.35 u[IU]/mL (ref 0.450–4.500)

## 2018-08-02 LAB — CMP14+EGFR
ALT: 9 IU/L (ref 0–32)
AST: 15 IU/L (ref 0–40)
Albumin/Globulin Ratio: 2.1 (ref 1.2–2.2)
Albumin: 5 g/dL — ABNORMAL HIGH (ref 3.8–4.8)
Alkaline Phosphatase: 93 IU/L (ref 39–117)
BUN/Creatinine Ratio: 15 (ref 12–28)
BUN: 10 mg/dL (ref 8–27)
Bilirubin Total: 0.3 mg/dL (ref 0.0–1.2)
CO2: 26 mmol/L (ref 20–29)
Calcium: 10.7 mg/dL — ABNORMAL HIGH (ref 8.7–10.3)
Chloride: 100 mmol/L (ref 96–106)
Creatinine, Ser: 0.68 mg/dL (ref 0.57–1.00)
GFR calc Af Amer: 104 mL/min/{1.73_m2} (ref >59)
GFR calc non Af Amer: 90 mL/min/{1.73_m2} (ref >59)
Globulin, Total: 2.4 g/dL (ref 1.5–4.5)
Glucose: 106 mg/dL — ABNORMAL HIGH (ref 65–99)
Potassium: 5 mmol/L (ref 3.5–5.2)
Sodium: 142 mmol/L (ref 134–144)
Total Protein: 7.4 g/dL (ref 6.0–8.5)

## 2018-08-02 LAB — CBC WITH DIFFERENTIAL/PLATELET
Basophils Absolute: 0 10*3/uL (ref 0.0–0.2)
Basos: 0 %
EOS (ABSOLUTE): 0.5 10*3/uL — ABNORMAL HIGH (ref 0.0–0.4)
Eos: 6 %
Hematocrit: 41.1 % (ref 34.0–46.6)
Hemoglobin: 13.4 g/dL (ref 11.1–15.9)
Immature Grans (Abs): 0 10*3/uL (ref 0.0–0.1)
Immature Granulocytes: 0 %
Lymphocytes Absolute: 1.6 10*3/uL (ref 0.7–3.1)
Lymphs: 18 %
MCH: 27.3 pg (ref 26.6–33.0)
MCHC: 32.6 g/dL (ref 31.5–35.7)
MCV: 84 fL (ref 79–97)
Monocytes Absolute: 0.9 10*3/uL (ref 0.1–0.9)
Monocytes: 10 %
Neutrophils Absolute: 5.7 10*3/uL (ref 1.4–7.0)
Neutrophils: 66 %
Platelets: 319 10*3/uL (ref 150–450)
RBC: 4.9 x10E6/uL (ref 3.77–5.28)
RDW: 13.3 % (ref 11.7–15.4)
WBC: 8.7 10*3/uL (ref 3.4–10.8)

## 2018-08-02 MED ORDER — BACLOFEN 10 MG PO TABS: 10.0000 mg | ORAL_TABLET | Freq: Three times a day (TID) | ORAL | 2 refills | Status: DC | PRN

## 2018-08-02 NOTE — Telephone Encounter (Signed)
Patient aware and verbalizes understanding. 

## 2018-08-02 NOTE — Telephone Encounter (Signed)
Please let the patient know that the insurance denied the refill request for methocarbamol/Robaxin for muscle relaxer and I have sent in a different muscle relaxer for her. Arville Care, MD [facility] 08/02/2018, 12:02 PM

## 2018-08-02 NOTE — Telephone Encounter (Signed)
Patient's insurance has denied coverage for Methocarbamol.  They did not give any alternatives.

## 2018-08-12 DIAGNOSIS — M545 Low back pain: Secondary | ICD-10-CM | POA: Diagnosis not present

## 2018-08-12 DIAGNOSIS — M546 Pain in thoracic spine: Secondary | ICD-10-CM | POA: Diagnosis not present

## 2018-08-13 ENCOUNTER — Other Ambulatory Visit: Payer: Self-pay | Admitting: Orthopedic Surgery

## 2018-08-13 DIAGNOSIS — M546 Pain in thoracic spine: Secondary | ICD-10-CM

## 2018-08-16 ENCOUNTER — Other Ambulatory Visit: Payer: Self-pay

## 2018-08-19 ENCOUNTER — Other Ambulatory Visit: Payer: PPO

## 2018-08-19 ENCOUNTER — Other Ambulatory Visit: Payer: Self-pay

## 2018-08-19 DIAGNOSIS — M5489 Other dorsalgia: Secondary | ICD-10-CM | POA: Diagnosis not present

## 2018-08-26 ENCOUNTER — Other Ambulatory Visit: Payer: Self-pay

## 2018-08-26 ENCOUNTER — Ambulatory Visit (HOSPITAL_COMMUNITY)
Admission: RE | Admit: 2018-08-26 | Discharge: 2018-08-26 | Disposition: A | Discharge status: Home / Self Care | Payer: PPO | Source: Ambulatory Visit | Attending: Orthopedic Surgery | Admitting: Orthopedic Surgery

## 2018-08-26 DIAGNOSIS — M546 Pain in thoracic spine: Secondary | ICD-10-CM | POA: Diagnosis not present

## 2018-08-26 DIAGNOSIS — M4804 Spinal stenosis, thoracic region: Secondary | ICD-10-CM | POA: Diagnosis not present

## 2018-08-29 ENCOUNTER — Telehealth: Payer: Self-pay | Admitting: Cardiology

## 2018-08-29 NOTE — Telephone Encounter (Signed)
Virtual Visit Pre-Appointment Phone Call  "(Name), I am calling you today to discuss your upcoming appointment. We are currently trying to limit exposure to the virus that causes COVID-19 by seeing patients at home rather than in the office."  1. "What is the BEST phone number to call the day of the visit?" - include this in appointment notes  2. Do you have or have access to (through a family member/friend) a smartphone with video capability that we can use for your visit?" a. If yes - list this number in appt notes as cell (if different from BEST phone #) and list the appointment type as a VIDEO visit in appointment notes b. If no - list the appointment type as a PHONE visit in appointment notes  Confirm consent - "In the setting of the current Covid19 crisis, you are scheduled for a (phone or video) visit with your provider on (date) at (time).  Just as we do with many in-office visits, in order for you to participate in this visit, we must obtain consent.  If you'd like, I can send this to your mychart (if signed up) or email for you to review.  Otherwise, I can obtain your verbal consent now.  All virtual visits are billed to your insurance company just like a normal visit would be.  By agreeing to a virtual visit, we'd like you to understand that the technology does not allow for your provider to perform an examination, and thus may limit your provider's ability to fully assess your condition. If your provider identifies any concerns that need to be evaluated in person, we will make arrangements to do so.  Finally, though the technology is pretty good, we cannot assure that it will always work on either your or our end, and in the setting of a video visit, we may have to convert it to a phone-only visit.  In either situation, we cannot ensure that we have a secure connection.  Are you willing to proceed?" STAFF: Did the patient verbally acknowledge consent to telehealth visit? Document  YES/NO here: yes 3. Advise patient to be prepared - "Two hours prior to your appointment, go ahead and check your blood pressure, pulse, oxygen saturation, and your weight (if you have the equipment to check those) and write them all down. When your visit starts, your provider will ask you for this information. If you have an Apple Watch or Kardia device, please plan to have heart rate information ready on the day of your appointment. Please have a pen and paper handy nearby the day of the visit as well."  4. Give patient instructions for MyChart download to smartphone OR Doximity/Doxy.me as below if video visit (depending on what platform provider is using)  5. Inform patient they will receive a phone call 15 minutes prior to their appointment time (may be from unknown caller ID) so they should be prepared to answer    TELEPHONE CALL NOTE  Laura Dalton has been deemed a candidate for a follow-up tele-health visit to limit community exposure during the Covid-19 pandemic. I spoke with the patient via phone to ensure availability of phone/video source, confirm preferred email & phone number, and discuss instructions and expectations.  I reminded Laura Dalton to be prepared with any vital sign and/or heart rhythm information that could potentially be obtained via home monitoring, at the time of her visit. I reminded Laura Dalton to expect a phone call prior to her visit.  Laura ContrasStephanie R Smith 08/29/2018 8:23 AM   INSTRUCTIONS FOR DOWNLOADING THE MYCHART APP TO SMARTPHONE  - The patient must first make sure to have activated MyChart and know their login information - If Apple, go to [employer] Store and type in MyChart in the search bar and download the app. If Android, ask patient to go to [employer] Play Store and type in [place] in the search bar and download the app. The app is free but as with any other app downloads, their phone may require them to verify saved payment information or  Apple/Android password.  - The patient will need to then log into the app with their MyChart username and password, and select [place] as their healthcare provider to link the account. When it is time for your visit, go to the MyChart app, find appointments, and click Begin Video Visit. Be sure to Select Allow for your device to access the Microphone and Camera for your visit. You will then be connected, and your provider will be with you shortly.  **If they have any issues connecting, or need assistance please contact MyChart service desk [phone])**  **If using a computer, in order to ensure the best quality for their visit they will need to use either of the following Internet Browsers: [employer] Edge, or Google Chrome**  IF USING DOXIMITY or DOXY.ME - The patient will receive a link just prior to their visit by text.     FULL LENGTH CONSENT FOR TELE-HEALTH VISIT   I hereby voluntarily request, consent and authorize [facility] and its employed or contracted physicians, physician assistants, nurse practitioners or other licensed health care professionals (the Practitioner), to provide me with telemedicine health care services (the Services") as deemed necessary by the treating Practitioner. I acknowledge and consent to receive the Services by the Practitioner via telemedicine. I understand that the telemedicine visit will involve communicating with the Practitioner through live audiovisual communication technology and the disclosure of certain medical information by electronic transmission. I acknowledge that I have been given the opportunity to request an in-person assessment or other available alternative prior to the telemedicine visit and am voluntarily participating in the telemedicine visit.  I understand that I have the right to withhold or withdraw my consent to the use of telemedicine in the course of my care at any time, without affecting my right to future care  or treatment, and that the Practitioner or I may terminate the telemedicine visit at any time. I understand that I have the right to inspect all information obtained and/or recorded in the course of the telemedicine visit and may receive copies of available information for a reasonable fee.  I understand that some of the potential risks of receiving the Services via telemedicine include:   Delay or interruption in medical evaluation due to technological equipment failure or disruption;  Information transmitted may not be sufficient (e.g. poor resolution of images) to allow for appropriate medical decision making by the Practitioner; and/or   In rare instances, security protocols could fail, causing a breach of personal health information.  Furthermore, I acknowledge that it is my responsibility to provide information about my medical history, conditions and care that is complete and accurate to the best of my ability. I acknowledge that Practitioner's advice, recommendations, and/or decision may be based on factors not within their control, such as incomplete or inaccurate data provided by me or distortions of diagnostic images or specimens that may result from electronic transmissions. I understand that the  practice of medicine is not an Chief Strategy Officer and that Practitioner makes no warranties or guarantees regarding treatment outcomes. I acknowledge that I will receive a copy of this consent concurrently upon execution via email to the email address I last provided but may also request a printed copy by calling the office of [facility].    I understand that my insurance will be billed for this visit.   I have read or had this consent read to me.  I understand the contents of this consent, which adequately explains the benefits and risks of the Services being provided via telemedicine.   I have been provided ample opportunity to ask questions regarding this consent and the Services and have had  my questions answered to my satisfaction.  I give my informed consent for the services to be provided through the use of telemedicine in my medical care  By participating in this telemedicine visit I agree to the above.

## 2018-09-04 ENCOUNTER — Telehealth (INDEPENDENT_AMBULATORY_CARE_PROVIDER_SITE_OTHER): Payer: PPO | Admitting: Cardiology

## 2018-09-04 ENCOUNTER — Encounter: Payer: Self-pay | Admitting: Cardiology

## 2018-09-04 VITALS — BP 167/63 | Ht 67.0 in | Wt 193.0 lb

## 2018-09-04 DIAGNOSIS — R0789 Other chest pain: Secondary | ICD-10-CM | POA: Diagnosis not present

## 2018-09-04 DIAGNOSIS — E782 Mixed hyperlipidemia: Secondary | ICD-10-CM

## 2018-09-04 DIAGNOSIS — I10 Essential (primary) hypertension: Secondary | ICD-10-CM

## 2018-09-04 MED ORDER — AMLODIPINE BESYLATE 10 MG PO TABS: 10.0000 mg | ORAL_TABLET | Freq: Every day | ORAL | 1 refills | Status: DC

## 2018-09-04 NOTE — Progress Notes (Signed)
Virtual Visit via Telephone Note   This visit type was conducted due to national recommendations for restrictions regarding the COVID-19 Pandemic (e.g. social distancing) in an effort to limit this patient's exposure and mitigate transmission in our community.  Due to her co-morbid illnesses, this patient is at least at moderate risk for complications without adequate follow up.  This format is felt to be most appropriate for this patient at this time.  The patient did not have access to video technology/had technical difficulties with video requiring transitioning to audio format only (telephone).  All issues noted in this document were discussed and addressed.  No physical exam could be performed with this format.  Please refer to the patient's chart for her  consent [facility].   Date:  09/04/2018   ID:  Laura Dalton, DOB 10/29/1950, MRN [id number]  Patient Location: Home Provider Location: Office  PCP:  Dettinger, Elige RadonJoshua A, MD  Cardiologist:  Dr Dina RichJonathan Aydee Mcnew MD Electrophysiologist:  None   Evaluation Performed:  New patient (over 3 years since last visit)  Chief Complaint:  HTN  History of Present Illness:    Laura Dalton is a 68 y.o. female seen today for follow up of the following medical problems.   1. Chest pain - patient seen previously for episodes of chest pain. She has an abnormal EKG at baseline, and completed an echo and stress echo that were overall unremarkable at that time.   - no recent symptoms   2. HTN -compliant with mes  3. HL  10/2017 TC 118 TG 124 HDL 38 LDL 55 - compliant with statin  4. CVA - admit 07/2015 with CVA. Has no long term residual deficits.   5. Carotid stenosis - carotid US 07/2015 mild bilateral <50% ICA disease  6. CAD - noted on 06/2018 CT A/P, as well as moderate atherosclerosis  The patient does not have symptoms concerning for COVID-19 infection (fever, chills, cough, or new shortness of  breath).    Past Medical History:  Diagnosis Date  . Bursitis of hip   . Diabetes mellitus without complication (HCC)    type II  . Hypertension   . Hypothyroidism   . Low back pain    benign turmor back  . Stroke (HCC)   . Vitamin D deficiency disease    Past Surgical History:  Procedure Laterality Date  . APPENDECTOMY    . BACK SURGERY  1996   turmor on back   . CHOLECYSTECTOMY    . LEG SURGERY Right    "clot and had drain in leg  rt lower leg     No outpatient medications have been marked as taking for the 09/04/18 encounter (Appointment) with Antoine PocheBranch, Chad Donoghue F, MD.     Allergies:   Aspirin   Social History   Tobacco Use  . Smoking status: Former Smoker    Packs/day: 0.50    Years: 45.00    Pack years: 22.50    Types: Cigarettes    Start date: 08/15/1962    Quit date: 12/01/2007    Years since quitting: 10.7  . Smokeless tobacco: Never Used  Substance Use Topics  . Alcohol use: No    Alcohol/week: 0.0 standard drinks  . Drug use: No     Family Hx: The patient's family history includes COPD in her brother, brother, sister, sister, and sister; Cancer in her brother, brother, and father; Diabetes in her mother; Hashimoto's thyroiditis in her son; Seizures in  her mother; Stroke in her mother; Thyroid disease in her son.  ROS:   Please see the history of present illness.    Per hpi All other systems reviewed and are negative.   Prior CV studies:   The following studies were reviewed today:  01/06/13 EKG: sinus rhythm, poor R-wave progression, non-specific ST/T changes  01/2013 echo Study Conclusions  - Left ventricle: The cavity size was normal. Wall thickness was increased in a pattern of mild LVH. Systolic function was normal. The estimated ejection fraction was in the range of 60% to 65%. Wall motion was normal; there were no regional wall motion abnormalities. Doppler parameters are consistent with abnormal left ventricular relaxation  (grade 1 diastolic dysfunction). - Aortic valve: Trileaflet; mildly thickened leaflets. No significant regurgitation. Mean gradient: 4mm Hg (S). - Mitral valve: Mildly thickened leaflets . Trivial regurgitation. - Right atrium: Central venous pressure: 3mm Hg (est). - Atrial septum: No defect or patent foramen ovale was identified. - Tricuspid valve: Physiologic regurgitation. - Pulmonary arteries: PA peak pressure: 30mm Hg (S). - Pericardium, extracardiac: There was no pericardial effusion. Impressions:  - No prior study for comparison. Mild LVH with LVEF 60-65%, grade 1 diastolic dysfunction. Mildly thickened aortic and mitral valves, nosignificant regurgitant lesions. PASP 30 mm mercury. No pericardial effusion.  01/2013 Stress echo Study Conclusions  - Stress ECG conclusions: The stress ECG was negative for ischemia. - Staged echo: There was no echocardiographic evidence for stress-induced ischemia.  Labs/Other Tests and Data Reviewed:    EKG:  n/a  Recent Labs: 08/01/2018: ALT 9; BUN 10; Creatinine, Ser 0.68; Hemoglobin 13.4; Platelets 319; Potassium 5.0; Sodium 142; TSH 1.350   Recent Lipid Panel Lab Results  Component Value Date/Time   CHOL 118 11/07/2017 11:24 AM   CHOL 142 10/01/2012 01:08 PM   TRIG 124 11/07/2017 11:24 AM   TRIG 293 (H) 01/06/2013 01:16 PM   TRIG 156 (H) 10/01/2012 01:08 PM   HDL 38 (L) 11/07/2017 11:24 AM   HDL 45 01/06/2013 01:16 PM   HDL 38 (L) 10/01/2012 01:08 PM   CHOLHDL 3.1 11/07/2017 11:24 AM   CHOLHDL 2.5 08/02/2015 04:55 AM   LDLCALC 55 11/07/2017 11:24 AM   LDLCALC 111 (H) 01/06/2013 01:16 PM   LDLCALC 73 10/01/2012 01:08 PM    Wt Readings from Last 3 Encounters:  08/01/18 194 lb 9.6 oz (88.3 kg)  06/20/18 195 lb (88.5 kg)  11/07/17 193 lb (87.5 kg)     Objective:    Vital Signs:   Today's Vitals   09/04/18 1521  BP: (!) 167/63  Weight: 193 lb (87.5 kg)  Height: 5\' 7"  (1.702 m)   Body mass  index is 30.23 kg/m.  Normal affect. Normal speech pattern and tone. No audible signs of SOB or wheezing. Comfortable, no apparent distress  ASSESSMENT & PLAN:    1. Chest pain - no recent symptoms, negative workup a few years ago. Recent CT A/P does show some CAD and atherosclerosis - continue risk factor modification   2. HTN  - above goal of <130/80 - increase norvasc to 5mg  daily  3. Hyperlipidemia  - at goal, continue statin     COVID-19 Education: The signs and symptoms of COVID-19 were discussed with the patient and how to seek care for testing (follow up with PCP or arrange E-visit).  The importance of social distancing was discussed today.  Time:   Today, I have spent 20 minutes with the patient with telehealth technology discussing the above  problems.     Medication Adjustments/Labs and Tests Ordered: Current medicines are reviewed at length with the patient today.  Concerns regarding medicines are outlined above.   Tests Ordered: No orders of the defined types were placed in this encounter.   Medication Changes: No orders of the defined types were placed in this encounter.   Follow Up:  In Person in 1 year(s)  Signed, Carlyle Dolly, MD  09/04/2018 2:47 PM    [place]

## 2018-09-04 NOTE — Addendum Note (Signed)
Addended by: Julian Hy T on: 09/04/2018 03:55 PM   Modules accepted: Orders

## 2018-09-04 NOTE — Patient Instructions (Signed)
Your physician wants you to follow-up in: 1 YEAR WITH DR BRANCH You will receive a reminder letter in the mail two months in advance. If you don't receive a letter, please call our office to schedule the follow-up appointment.  Your physician has recommended you make the following change in your medication:   INCREASE AMLODIPINE 10 MG DAILY   Thank you for choosing [facility]!!     

## 2018-09-06 ENCOUNTER — Other Ambulatory Visit: Payer: Self-pay | Admitting: Family Medicine

## 2018-09-06 NOTE — Telephone Encounter (Signed)
What is the name of the medication? Tramadol  Have you contacted your pharmacy to request a refill? yes  Which pharmacy would you like this sent to? [facility]   Patient notified that their request is being sent to the clinical staff for review and that they should receive a call once it is complete. If they do not receive a call within 24 hours they can check with their pharmacy or our office.    Patient aware of office policy.

## 2018-09-06 NOTE — Telephone Encounter (Signed)
Pt aware that she needs appt for this med

## 2018-09-09 DIAGNOSIS — H40023 Open angle with borderline findings, high risk, bilateral: Secondary | ICD-10-CM | POA: Diagnosis not present

## 2018-10-08 ENCOUNTER — Encounter: Payer: Self-pay | Admitting: Family Medicine

## 2018-10-08 ENCOUNTER — Telehealth: Payer: Self-pay | Admitting: Family Medicine

## 2018-10-08 ENCOUNTER — Ambulatory Visit (INDEPENDENT_AMBULATORY_CARE_PROVIDER_SITE_OTHER): Payer: PPO | Admitting: Family Medicine

## 2018-10-08 DIAGNOSIS — R600 Localized edema: Secondary | ICD-10-CM

## 2018-10-08 MED ORDER — HYDROCHLOROTHIAZIDE 25 MG PO TABS: 25.0000 mg | ORAL_TABLET | Freq: Every day | ORAL | 0 refills | Status: DC

## 2018-10-08 NOTE — Telephone Encounter (Signed)
Apt scheduled with Rakes 7/28 due to patient having ankle swelling.

## 2018-10-08 NOTE — Progress Notes (Signed)
Virtual Visit via telephone Note Due to COVID-19 pandemic this visit was conducted virtually. This visit type was conducted due to national recommendations for restrictions regarding the COVID-19 Pandemic (e.g. social distancing, sheltering in place) in an effort to limit this patient's exposure and mitigate transmission in our community. All issues noted in this document were discussed and addressed.  A physical exam was not performed with this format.   I connected with Laura Dalton on 10/08/18 at 1405 by telephone and verified that I am speaking with the correct person using two identifiers. Laura Dalton is currently located at home and family is currently with them during visit. The provider, Monia Pouch, FNP is located in their office at time of visit.  I discussed the limitations, risks, security and privacy concerns of performing an evaluation and management service by telephone and the availability of in person appointments. I also discussed with the patient that there may be a patient responsible charge related to this service. The patient expressed understanding and agreed to proceed.  Subjective:  Patient ID: Laura Dalton, female    DOB: December 09, 1950, 68 y.o.   MRN: [id number]   Chief Complaint:  Leg Swelling   HPI: Laura Dalton is a 68 y.o. female presenting on 10/08/2018 for Leg Swelling   Pt states over the last few days she has had increased swelling in her feet. States she is on her feet for long periods and this increases the swelling. Pt states she was on a combination antihypertensive that contained a diuretic but this was recalled. States she was not placed back on a diuretic. States her blood pressures have been running 140/80-90. She denies fatigue, orthopnea, PND, shortness of breath, chest pain, palpitations, dizziness, or syncope. She does try to limit sodium intake.   Lab Results      Component                Value               Date                       CREATININE               0.68                08/01/2018                CREATININE               0.74                06/20/2018                CREATININE               0.76                11/07/2017              CMP Latest Ref Rng & Units 08/01/2018 06/20/2018 11/07/2017  Glucose 65 - 99 mg/dL 106(H) 104(H) 105(H)  BUN 8 - 27 mg/dL 10 12 10   Creatinine 0.57 - 1.00 mg/dL 0.68 0.74 0.76  Sodium 134 - 144 mmol/L 142 140 145(H)  Potassium 3.5 - 5.2 mmol/L 5.0 3.5 4.4  Chloride 96 - 106 mmol/L 100 103 100  CO2 20 - 29 mmol/L 26 27 29   Calcium 8.7 - 10.3 mg/dL 10.7(H) 9.4 9.9  Total Protein 6.0 - 8.5 g/dL 7.4 7.9  7.1  Total Bilirubin 0.0 - 1.2 mg/dL 0.3 0.4 0.3  Alkaline Phos 39 - 117 IU/L 93 82 75  AST 0 - 40 IU/L 15 18 15   ALT 0 - 32 IU/L 9 9 5   .   Relevant past medical, surgical, family, and social history reviewed and updated as indicated.  Allergies and medications reviewed and updated.   Past Medical History:  Diagnosis Date  . Bursitis of hip   . Diabetes mellitus without complication (HCC)    type II  . Hypertension   . Hypothyroidism   . Low back pain    benign turmor back  . Stroke (HCC)   . Vitamin D deficiency disease     Past Surgical History:  Procedure Laterality Date  . APPENDECTOMY    . BACK SURGERY  1996   turmor on back   . CHOLECYSTECTOMY    . LEG SURGERY Right    "clot and had drain in leg  rt lower leg    Social History   Socioeconomic History  . Marital status: Widowed    Spouse name: Not on file  . Number of children: 3  . Years of education: Not on file  . Highest education level: Not on file  Occupational History  . 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 Financial resource strain: Not hard at all  . Food insecurity    Worry: Never true    Inability: Never true  . Transportation needs    Medical: No    Non-medical: No  Tobacco Use  . Smoking status: Former Smoker    Packs/day: 0.50    Years: 45.00     Pack years: 22.50    Types: Cigarettes    Start date: 08/15/1962    Quit date: 12/01/2007    Years since quitting: 10.8  . Smokeless tobacco: Never Used  Substance and Sexual Activity  . Alcohol use: No    Alcohol/week: 0.0 standard drinks  . Drug use: No  . Sexual activity: Not Currently  Lifestyle  . Physical activity    Days per week: 7 days    Minutes per session: 60 min  . Stress: To some extent  Relationships  . Social connections    Talks on phone: More than three times a week    Gets together: More than three times a week    Attends religious service: More than 4 times per year    Active member of club or organization: Yes    Attends meetings of clubs or organizations: More than 4 times per year    Relationship status: Widowed  . Intimate partner violence    Fear of current or ex partner: No    Emotionally abused: No    Physically abused: No    Forced sexual activity: Not on file  Other Topics Concern  . Not on file  Social History Narrative  . Not on file    Outpatient Encounter Medications as of 10/08/2018  Medication Sig  . amitriptyline (ELAVIL) 100 MG tablet Take 1 tablet (100 mg total) by mouth at bedtime.  Marland Kitchen. amLODipine (NORVASC) 10 MG tablet Take 1 tablet (10 mg total) by mouth daily.  Marland Kitchen. atorvastatin (LIPITOR) 40 MG tablet Take 1 tablet (40 mg total) by mouth daily.  . baclofen (LIORESAL) 10 MG tablet Take 1 tablet (10 mg total) by mouth 3 (three) times daily as needed for muscle spasms.  . calcium carbonate (OSCAL) 1500 (600 Ca)  MG TABS tablet Take by mouth 2 (two) times daily with a meal.  . clopidogrel (PLAVIX) 75 MG tablet Take 1 tablet (75 mg total) by mouth daily.  Marland Kitchen. gabapentin (NEURONTIN) 600 MG tablet Take 1 tablet (600 mg total) by mouth 4 (four) times daily.  . hydrochlorothiazide (HYDRODIURIL) 25 MG tablet Take 1 tablet (25 mg total) by mouth daily.  Marland Kitchen. levothyroxine (SYNTHROID) 88 MCG tablet Take 1 tablet (88 mcg total) by mouth daily before  breakfast.  . lidocaine (XYLOCAINE) 5 % ointment Apply 1 application topically 3 (three) times daily as needed.  . meloxicam (MOBIC) 15 MG tablet TAKE 1 TABLET BY MOUTH ONCE DAILY  . metFORMIN (GLUCOPHAGE) 500 MG tablet Take 1 tablet (500 mg total) by mouth daily.  . TRAMADOL HCL PO Take by mouth.  . triamcinolone cream (KENALOG) 0.1 % Apply 1 application topically 2 (two) times daily.  . vitamin C (ASCORBIC ACID) 500 MG tablet Take 500 mg by mouth daily.   No facility-administered encounter medications on file as of 10/08/2018.     Allergies  Allergen Reactions  . Aspirin Hives    Review of Systems  Constitutional: Negative for chills, fatigue, fever and unexpected weight change.  Eyes: Negative for photophobia and visual disturbance.  Respiratory: Negative for cough, choking, chest tightness, shortness of breath and wheezing.   Cardiovascular: Positive for leg swelling. Negative for chest pain and palpitations.  Gastrointestinal: Negative for abdominal distention.  Genitourinary: Negative for decreased urine volume and difficulty urinating.  Skin: Negative for color change and pallor.  Neurological: Negative for dizziness, syncope, weakness, light-headedness and headaches.  Psychiatric/Behavioral: Negative for confusion.  All other systems reviewed and are negative.        Observations/Objective: No vital signs or physical exam, this was a telephone or virtual health encounter.  Pt alert and oriented, answers all questions appropriately, and able to speak in full sentences.    Assessment and Plan: Scheryl MartenVerna was seen today for leg swelling.  Diagnoses and all orders for this visit:  Edema of both feet Bilateral lower extremity edema after discontinuing diuretic. Last GFR 90, potassium 5, sodium 142, creatinine 0.68, and BUN 10. Limit sodium intake. Elevate legs when sitting. Monitor blood pressure and report any persistent high or low reading. Will initiate HCTZ 25 mg daily. Pt  to follow up in 2 weeks for CMP and BP check. Report any new or worsening symptoms. Medications as prescribed.  -     hydrochlorothiazide (HYDRODIURIL) 25 MG tablet; Take 1 tablet (25 mg total) by mouth daily.     Follow Up Instructions: Return in about 2 weeks (around 10/22/2018) for in office, CMP.    I discussed the assessment and treatment plan with the patient. The patient was provided an opportunity to ask questions and all were answered. The patient agreed with the plan and demonstrated an understanding of the instructions.   The patient was advised to call back or seek an in-person evaluation if the symptoms worsen or if the condition fails to improve as anticipated.  The above assessment and management plan was discussed with the patient. The patient verbalized understanding of and has agreed to the management plan. Patient is aware to call the clinic if symptoms persist or worsen. Patient is aware when to return to the clinic for a follow-up visit. Patient educated on when it is appropriate to go to the emergency department.    I provided 15 minutes of non-face-to-face time during this encounter. The call started  at 1405. The call ended at 1420. The other time was used for coordination of care.    Kari BaarsMichelle Rakes, FNP-C [facility] [address] [place], [place] [address] [phone] 10/08/18

## 2018-10-12 DIAGNOSIS — M4714 Other spondylosis with myelopathy, thoracic region: Secondary | ICD-10-CM | POA: Insufficient documentation

## 2018-10-17 ENCOUNTER — Telehealth: Payer: Self-pay | Admitting: Family Medicine

## 2018-10-17 NOTE — Telephone Encounter (Signed)
She needs an in office visit for evaluation and testing. She can follow up with her PCP.

## 2018-10-18 NOTE — Telephone Encounter (Signed)
Aware.  Keep feet elevated.  She can purchase tet hose to help with swelling.  She is aware to call back for an appointment if no improvement.

## 2018-10-24 ENCOUNTER — Telehealth: Payer: Self-pay | Admitting: Family Medicine

## 2018-10-24 NOTE — Telephone Encounter (Signed)
Patient was seen 7/28- please review and advise

## 2018-10-24 NOTE — Telephone Encounter (Signed)
lmtcb

## 2018-10-24 NOTE — Telephone Encounter (Signed)
This is the same fluid pill she had before as part of her medication except she just was on a higher dose, I would like her to get checked for her kidneys and electrolytes prior to increasing the dose and have her blood pressure checked.  We schedule her visit for 2 to 3 weeks from now so we can check her blood pressure and check kidneys and then we can further discuss the swelling.  In the meantime she can wear compression stockings.

## 2018-10-30 ENCOUNTER — Encounter: Payer: Self-pay | Admitting: Family Medicine

## 2018-10-30 ENCOUNTER — Ambulatory Visit (INDEPENDENT_AMBULATORY_CARE_PROVIDER_SITE_OTHER): Payer: PPO | Admitting: Family Medicine

## 2018-10-30 DIAGNOSIS — I10 Essential (primary) hypertension: Secondary | ICD-10-CM

## 2018-10-30 DIAGNOSIS — R609 Edema, unspecified: Secondary | ICD-10-CM | POA: Diagnosis not present

## 2018-10-30 DIAGNOSIS — E1159 Type 2 diabetes mellitus with other circulatory complications: Secondary | ICD-10-CM | POA: Diagnosis not present

## 2018-10-30 MED ORDER — LISINOPRIL 20 MG PO TABS: 20.0000 mg | ORAL_TABLET | Freq: Every day | ORAL | 3 refills | Status: DC

## 2018-10-30 NOTE — Progress Notes (Signed)
Virtual Visit via telephone Note  I connected with Laura Dalton on 10/30/18 at 1015 by telephone and verified that I am speaking with the correct person using two identifiers. Laura Dalton is currently located at home and no other people are currently with her during visit. The provider, Fransisca Kaufmann Tailor Westfall, MD is located in their office at time of visit.  Call ended at 1030  I discussed the limitations, risks, security and privacy concerns of performing an evaluation and management service by telephone and the availability of in person appointments. I also discussed with the patient that there may be a patient responsible charge related to this service. The patient expressed understanding and agreed to proceed.   History and Present Illness: Patient is having more swelling and is still have bp of 163-60.  paitent is still having a lot of swelling and she feels like it is worse since increasing the amlodipine.  She has been having difficulty with the swelling and has not noticed the hydrochlorathiazide.  She feels like her blood pressure still running up and the swelling has only gotten worse with the increase of the amlodipine  No diagnosis found.  Outpatient Encounter Medications as of 10/30/2018  Medication Sig  . amitriptyline (ELAVIL) 100 MG tablet Take 1 tablet (100 mg total) by mouth at bedtime.  Marland Kitchen amLODipine (NORVASC) 10 MG tablet Take 1 tablet (10 mg total) by mouth daily.  Marland Kitchen atorvastatin (LIPITOR) 40 MG tablet Take 1 tablet (40 mg total) by mouth daily.  . baclofen (LIORESAL) 10 MG tablet Take 1 tablet (10 mg total) by mouth 3 (three) times daily as needed for muscle spasms.  . calcium carbonate (OSCAL) 1500 (600 Ca) MG TABS tablet Take by mouth 2 (two) times daily with a meal.  . clopidogrel (PLAVIX) 75 MG tablet Take 1 tablet (75 mg total) by mouth daily.  Marland Kitchen gabapentin (NEURONTIN) 600 MG tablet Take 1 tablet (600 mg total) by mouth 4 (four) times daily.  .  hydrochlorothiazide (HYDRODIURIL) 25 MG tablet Take 1 tablet (25 mg total) by mouth daily.  Marland Kitchen levothyroxine (SYNTHROID) 88 MCG tablet Take 1 tablet (88 mcg total) by mouth daily before breakfast.  . lidocaine (XYLOCAINE) 5 % ointment Apply 1 application topically 3 (three) times daily as needed.  . meloxicam (MOBIC) 15 MG tablet TAKE 1 TABLET BY MOUTH ONCE DAILY  . metFORMIN (GLUCOPHAGE) 500 MG tablet Take 1 tablet (500 mg total) by mouth daily.  . TRAMADOL HCL PO Take by mouth.  . triamcinolone cream (KENALOG) 0.1 % Apply 1 application topically 2 (two) times daily.  . vitamin C (ASCORBIC ACID) 500 MG tablet Take 500 mg by mouth daily.   No facility-administered encounter medications on file as of 10/30/2018.     Review of Systems  Constitutional: Negative for chills and fever.  Eyes: Negative for visual disturbance.  Respiratory: Negative for chest tightness and shortness of breath.   Cardiovascular: Positive for leg swelling. Negative for chest pain.  Musculoskeletal: Negative for back pain and gait problem.  Skin: Negative for rash.  Neurological: Negative for light-headedness and headaches.  Psychiatric/Behavioral: Negative for agitation and behavioral problems.  All other systems reviewed and are negative.   Observations/Objective: Patient sounds comfortable and in no acute distress  Assessment and Plan: Problem List Items Addressed This Visit      Cardiovascular and Mediastinum   Hypertension associated with diabetes ([place]) - Primary   Relevant Medications   lisinopril (ZESTRIL) 20 MG tablet  Other Relevant Orders   BMP8+EGFR    Other Visit Diagnoses    Peripheral edema       Relevant Medications   lisinopril (ZESTRIL) 20 MG tablet   Other Relevant Orders   BMP8+EGFR       Follow Up Instructions:  Hypertension recheck in 2 months  Will start lisinopril and then we will see eventually about transitioning from the amlodipine to the lisinopril to reduce the  swelling, in the meantime continue the hydrochlorothiazide as well   I discussed the assessment and treatment plan with the patient. The patient was provided an opportunity to ask questions and all were answered. The patient agreed with the plan and demonstrated an understanding of the instructions.   The patient was advised to call back or seek an in-person evaluation if the symptoms worsen or if the condition fails to improve as anticipated.  The above assessment and management plan was discussed with the patient. The patient verbalized understanding of and has agreed to the management plan. Patient is aware to call the clinic if symptoms persist or worsen. Patient is aware when to return to the clinic for a follow-up visit. Patient educated on when it is appropriate to go to the emergency department.    I provided 15 minutes of non-face-to-face time during this encounter.    Worthy Rancher, MD

## 2018-10-31 NOTE — Telephone Encounter (Signed)
Patient seen since telephone encounter.  This encounter will now be closed. 

## 2018-11-06 ENCOUNTER — Other Ambulatory Visit: Payer: Self-pay

## 2018-11-06 ENCOUNTER — Other Ambulatory Visit: Payer: PPO

## 2018-11-06 DIAGNOSIS — R609 Edema, unspecified: Secondary | ICD-10-CM | POA: Diagnosis not present

## 2018-11-06 DIAGNOSIS — E1159 Type 2 diabetes mellitus with other circulatory complications: Secondary | ICD-10-CM | POA: Diagnosis not present

## 2018-11-06 DIAGNOSIS — I10 Essential (primary) hypertension: Secondary | ICD-10-CM | POA: Diagnosis not present

## 2018-11-06 LAB — BMP8+EGFR
BUN/Creatinine Ratio: 13 (ref 12–28)
BUN: 10 mg/dL (ref 8–27)
CO2: 26 mmol/L (ref 20–29)
Calcium: 9.8 mg/dL (ref 8.7–10.3)
Chloride: 98 mmol/L (ref 96–106)
Creatinine, Ser: 0.8 mg/dL (ref 0.57–1.00)
GFR calc Af Amer: 87 mL/min/1.73 (ref >59)
GFR calc non Af Amer: 75 mL/min/1.73 (ref >59)
Glucose: 121 mg/dL — ABNORMAL HIGH (ref 65–99)
Potassium: 3.5 mmol/L (ref 3.5–5.2)
Sodium: 142 mmol/L (ref 134–144)

## 2018-11-08 ENCOUNTER — Other Ambulatory Visit: Payer: Self-pay | Admitting: Family Medicine

## 2018-11-08 DIAGNOSIS — R600 Localized edema: Secondary | ICD-10-CM

## 2018-12-06 ENCOUNTER — Ambulatory Visit: Payer: PPO | Admitting: Family Medicine

## 2018-12-11 ENCOUNTER — Other Ambulatory Visit: Payer: Self-pay

## 2018-12-12 ENCOUNTER — Encounter: Payer: Self-pay | Admitting: Family Medicine

## 2018-12-12 ENCOUNTER — Ambulatory Visit (INDEPENDENT_AMBULATORY_CARE_PROVIDER_SITE_OTHER): Payer: PPO | Admitting: Family Medicine

## 2018-12-12 ENCOUNTER — Other Ambulatory Visit: Payer: Self-pay

## 2018-12-12 VITALS — BP 134/77 | HR 87 | Temp 98.0°F | Ht 67.0 in | Wt 193.6 lb

## 2018-12-12 DIAGNOSIS — Z1211 Encounter for screening for malignant neoplasm of colon: Secondary | ICD-10-CM

## 2018-12-12 DIAGNOSIS — E1142 Type 2 diabetes mellitus with diabetic polyneuropathy: Secondary | ICD-10-CM | POA: Diagnosis not present

## 2018-12-12 DIAGNOSIS — E785 Hyperlipidemia, unspecified: Secondary | ICD-10-CM | POA: Diagnosis not present

## 2018-12-12 DIAGNOSIS — I10 Essential (primary) hypertension: Secondary | ICD-10-CM | POA: Diagnosis not present

## 2018-12-12 DIAGNOSIS — E1169 Type 2 diabetes mellitus with other specified complication: Secondary | ICD-10-CM

## 2018-12-12 DIAGNOSIS — Z1231 Encounter for screening mammogram for malignant neoplasm of breast: Secondary | ICD-10-CM | POA: Diagnosis not present

## 2018-12-12 DIAGNOSIS — M4807 Spinal stenosis, lumbosacral region: Secondary | ICD-10-CM | POA: Diagnosis not present

## 2018-12-12 DIAGNOSIS — M419 Scoliosis, unspecified: Secondary | ICD-10-CM

## 2018-12-12 DIAGNOSIS — I152 Hypertension secondary to endocrine disorders: Secondary | ICD-10-CM

## 2018-12-12 DIAGNOSIS — E1159 Type 2 diabetes mellitus with other circulatory complications: Secondary | ICD-10-CM | POA: Diagnosis not present

## 2018-12-12 NOTE — Progress Notes (Signed)
BP 134/77   Pulse 87   Temp 98 F (36.7 C) (Temporal)   Ht 5\' 7"  (1.702 m)   Wt 193 lb 9.6 oz (87.8 kg)   SpO2 95%   BMI 30.32 kg/m    Subjective:   Patient ID: , female    DOB: January 24, 1951, 68 y.o.   MRN: [id number]  HPI: Laura Dalton is a 68 y.o. female presenting on 12/12/2018 for Hypertension (2 month follow up)   HPI Type 2 diabetes mellitus Patient comes in today for recheck of his diabetes. Patient has been currently taking metformin and gabapentin. Patient is currently on an ACE inhibitor/ARB. Patient has not seen an ophthalmologist this year. Patient denies any issues with their feet.  Patient has neuropathy and is on gabapentin.  Hypertension Patient is currently on lisinopril and hydrochlorothiazide, and their blood pressure today is 134/77. Patient denies any lightheadedness or dizziness. Patient denies headaches, blurred vision, chest pains, shortness of breath, or weakness. Denies any side effects from medication and is content with current medication.   Hyperlipidemia Patient is coming in for recheck of his hyperlipidemia. The patient is currently taking atorvastatin. They deny any issues with myalgias or history of liver damage from it. They deny any focal numbness or weakness or chest pain.   Hypothyroidism recheck Patient is coming in for thyroid recheck today as well. They deny any issues with hair changes or heat or cold problems or diarrhea or constipation. They deny any chest pain or palpitations. They are currently on levothyroxine 88 micrograms   Patient has been having increasing issues with her back scoliosis and spinal stenosis.  She has been seeing a specialist for this to help with that.  She is wondering if anything could help before going to surgery.  We discussed physical therapy and she would like to do that.  Relevant past medical, surgical, family and social history reviewed and updated as indicated. Interim medical history  since our last visit reviewed. Allergies and medications reviewed and updated.  Review of Systems  Constitutional: Negative for chills and fever.  Eyes: Negative for visual disturbance.  Respiratory: Negative for chest tightness and shortness of breath.   Cardiovascular: Negative for chest pain and leg swelling.  Musculoskeletal: Positive for arthralgias, back pain and myalgias. Negative for gait problem.  Skin: Negative for rash.  Neurological: Negative for light-headedness and headaches.  Psychiatric/Behavioral: Negative for agitation and behavioral problems.  All other systems reviewed and are negative.   Per HPI unless specifically indicated above   Allergies as of 12/12/2018      Reactions   Aspirin Hives      Medication List       Accurate as of December 12, 2018  4:55 PM. If you have any questions, ask your nurse or doctor.        STOP taking these medications   amLODipine 10 MG tablet Commonly known as: NORVASC Stopped by: December 14, 2018 [place], MD     TAKE these medications   amitriptyline 100 MG tablet Commonly known as: ELAVIL Take 1 tablet (100 mg total) by mouth at bedtime.   atorvastatin 40 MG tablet Commonly known as: LIPITOR Take 1 tablet (40 mg total) by mouth daily.   baclofen 10 MG tablet Commonly known as: LIORESAL Take 1 tablet (10 mg total) by mouth 3 (three) times daily as needed for muscle spasms.   calcium carbonate 1500 (600 Ca) MG Tabs tablet Commonly known as: OSCAL Take by mouth 2 (  two) times daily with a meal.   clopidogrel 75 MG tablet Commonly known as: PLAVIX Take 1 tablet (75 mg total) by mouth daily.   gabapentin 600 MG tablet Commonly known as: NEURONTIN Take 1 tablet (600 mg total) by mouth 4 (four) times daily.   hydrochlorothiazide 25 MG tablet Commonly known as: HYDRODIURIL Take 1 tablet by mouth once daily   levothyroxine 88 MCG tablet Commonly known as: SYNTHROID Take 1 tablet (88 mcg total) by mouth daily before  breakfast.   lidocaine 5 % ointment Commonly known as: XYLOCAINE Apply 1 application topically 3 (three) times daily as needed.   lisinopril 20 MG tablet Commonly known as: ZESTRIL Take 1 tablet (20 mg total) by mouth daily.   meloxicam 15 MG tablet Commonly known as: MOBIC TAKE 1 TABLET BY MOUTH ONCE DAILY   metFORMIN 500 MG tablet Commonly known as: GLUCOPHAGE Take 1 tablet (500 mg total) by mouth daily.   TRAMADOL HCL PO Take by mouth.   triamcinolone cream 0.1 % Commonly known as: KENALOG Apply 1 application topically 2 (two) times daily.   vitamin C 500 MG tablet Commonly known as: ASCORBIC ACID Take 500 mg by mouth daily.        Objective:   BP 134/77   Pulse 87   Temp 98 F (36.7 C) (Temporal)   Ht 5\' 7"  (1.702 m)   Wt 193 lb 9.6 oz (87.8 kg)   SpO2 95%   BMI 30.32 kg/m   Wt Readings from Last 3 Encounters:  12/12/18 193 lb 9.6 oz (87.8 kg)  09/04/18 193 lb (87.5 kg)  08/01/18 194 lb 9.6 oz (88.3 kg)    Physical Exam Vitals signs and nursing note reviewed.  Constitutional:      General: She is not in acute distress.    Appearance: She is well-developed. She is not diaphoretic.  Eyes:     Conjunctiva/sclera: Conjunctivae normal.  Cardiovascular:     Rate and Rhythm: Normal rate and regular rhythm.     Heart sounds: Normal heart sounds. No murmur.  Pulmonary:     Effort: Pulmonary effort is normal. No respiratory distress.     Breath sounds: Normal breath sounds. No wheezing.  Skin:    General: Skin is warm and dry.     Findings: No rash.  Neurological:     Mental Status: She is alert and oriented to person, place, and time.     Coordination: Coordination normal.  Psychiatric:        Behavior: Behavior normal.       Assessment & Plan:   Problem List Items Addressed This Visit      Cardiovascular and Mediastinum   Hypertension associated with diabetes (HCC) - Primary     Endocrine   DM (diabetes mellitus) (HCC)   Relevant Orders    Bayer DCA Hb A1c Waived (Completed)   Hyperlipidemia associated with type 2 diabetes mellitus (HCC)   Diabetic peripheral neuropathy associated with type 2 diabetes mellitus (HCC)   Relevant Orders   Bayer DCA Hb A1c Waived (Completed)    Other Visit Diagnoses    Scoliosis, unspecified scoliosis type, unspecified spinal region       Relevant Orders   Ambulatory referral to Physical Therapy   Spinal stenosis of lumbosacral region       Relevant Orders   Ambulatory referral to Physical Therapy   Colon cancer screening       Relevant Orders   Fecal occult blood, imunochemical  Encounter for screening mammogram for malignant neoplasm of breast       Relevant Orders   MM Digital Diagnostic Bilat      Will do referral to physical therapy for spinal stenosis and back pain and scoliosis and strengthening of the muscles around the spine. Follow up plan: Return in about 3 months (around 03/14/2019), or if symptoms worsen or fail to improve, for Hypertension hyperlipidemia and diabetes.  Counseling provided for all of the vaccine components Orders Placed This Encounter  Procedures  . Fecal occult blood, imunochemical  . MM Digital Diagnostic Bilat  . Bayer DCA Hb A1c Waived  . Ambulatory referral to Physical Therapy    Caryl Pina, MD [facility] 12/12/2018, 4:55 PM

## 2018-12-13 LAB — BAYER DCA HB A1C WAIVED: HB A1C (BAYER DCA - WAIVED): 6.1 % (ref <7.0)

## 2018-12-16 ENCOUNTER — Ambulatory Visit (INDEPENDENT_AMBULATORY_CARE_PROVIDER_SITE_OTHER): Payer: PPO | Admitting: *Deleted

## 2018-12-16 DIAGNOSIS — Z0000 Encounter for general adult medical examination without abnormal findings: Secondary | ICD-10-CM

## 2018-12-16 NOTE — Progress Notes (Signed)
MEDICARE ANNUAL WELLNESS VISIT  12/16/2018  Telephone Visit Disclaimer This Medicare AWV was conducted by telephone due to national recommendations for restrictions regarding the COVID-19 Pandemic (e.g. social distancing).  I verified, using two identifiers, that I am speaking with Laura Dalton or their authorized healthcare agent. I discussed the limitations, risks, security, and privacy concerns of performing an evaluation and management service by telephone and the potential availability of an in-person appointment in the future. The patient expressed understanding and agreed to proceed.   Subjective:  Laura Dalton is a 68 y.o. female patient of Dettinger, Elige Radon, MD who had a Medicare Annual Wellness Visit today via telephone. Laura Dalton is Retired and lives alone. she has 3 children. she reports that she is socially active and does interact with friends/family regularly. she is minimally physically active and enjoys painting, crafting and yard work.  Patient Care Team: Dettinger, Elige Radon, MD as PCP - General (Family Medicine) Donnetta Hail, MD as Consulting Physician (Rheumatology) Ranee Gosselin, MD as Consulting Physician (Orthopedic Surgery) Len Blalock, NP as Nurse Practitioner (Internal Medicine)  Advanced Directives 12/16/2018 06/20/2018 09/03/2017 08/31/2016 08/01/2015 04/15/2015 04/14/2015  Does Patient Have a Medical Advance Directive? No No Yes No Yes Yes No  Type of Advance Directive - Web designer;Living will - Living will Living will -  Does patient want to make changes to medical advance directive? - - No - Patient declined - - No - Patient declined -  Copy of Healthcare Power of Attorney in Chart? - - No - copy requested - No - copy requested No - copy requested -  Would patient like information on creating a medical advance directive? No - Patient declined - - Yes (MAU/Ambulatory/Procedural Areas - Information given) - No - patient declined  information -    Hospital Utilization Over the Past 12 Months: # of hospitalizations or ER visits: 1 # of surgeries: 0  Review of Systems    Patient reports that her overall health is unchanged compared to last year.  History obtained from chart review  Patient Reported Readings (BP, Pulse, CBG, Weight, etc) none  Pain Assessment Pain : No/denies pain     Current Medications & Allergies (verified) Allergies as of 12/16/2018      Reactions   Aspirin Hives      Medication List       Accurate as of December 16, 2018  9:04 AM. If you have any questions, ask your nurse or doctor.        amitriptyline 100 MG tablet Commonly known as: ELAVIL Take 1 tablet (100 mg total) by mouth at bedtime.   atorvastatin 40 MG tablet Commonly known as: LIPITOR Take 1 tablet (40 mg total) by mouth daily.   baclofen 10 MG tablet Commonly known as: LIORESAL Take 1 tablet (10 mg total) by mouth 3 (three) times daily as needed for muscle spasms.   calcium carbonate 1500 (600 Ca) MG Tabs tablet Commonly known as: OSCAL Take by mouth 2 (two) times daily with a meal.   clopidogrel 75 MG tablet Commonly known as: PLAVIX Take 1 tablet (75 mg total) by mouth daily.   gabapentin 600 MG tablet Commonly known as: NEURONTIN Take 1 tablet (600 mg total) by mouth 4 (four) times daily.   hydrochlorothiazide 25 MG tablet Commonly known as: HYDRODIURIL Take 1 tablet by mouth once daily   levothyroxine 88 MCG tablet Commonly known as: SYNTHROID Take 1 tablet (88 mcg  total) by mouth daily before breakfast.   lidocaine 5 % ointment Commonly known as: XYLOCAINE Apply 1 application topically 3 (three) times daily as needed.   lisinopril 20 MG tablet Commonly known as: ZESTRIL Take 1 tablet (20 mg total) by mouth daily.   meloxicam 15 MG tablet Commonly known as: MOBIC TAKE 1 TABLET BY MOUTH ONCE DAILY   metFORMIN 500 MG tablet Commonly known as: GLUCOPHAGE Take 1 tablet (500 mg total) by  mouth daily.   TRAMADOL HCL PO Take by mouth.   triamcinolone cream 0.1 % Commonly known as: KENALOG Apply 1 application topically 2 (two) times daily.   vitamin C 500 MG tablet Commonly known as: ASCORBIC ACID Take 500 mg by mouth daily.       History (reviewed): Past Medical History:  Diagnosis Date  . Bursitis of hip   . Diabetes mellitus without complication (HCC)    type II  . Hyperlipidemia   . Hypertension   . Hypothyroidism   . Low back pain    benign turmor back  . Stroke (HCC)   . Vitamin D deficiency disease    Past Surgical History:  Procedure Laterality Date  . APPENDECTOMY    . BACK SURGERY  1996   turmor on back   . CHOLECYSTECTOMY    . LEG SURGERY Right    "clot and had drain in leg  rt lower leg   Family History  Problem Relation Age of Onset  . Diabetes Mother   . Stroke Mother   . Seizures Mother   . Cancer Father        prostate  . Thyroid disease Son   . Hashimoto's thyroiditis Son   . COPD Sister   . Cancer Brother        lung  . COPD Sister   . COPD Sister   . Cancer Brother        prostate  . COPD Brother   . COPD Brother    Social History   Socioeconomic History  . Marital status: Widowed    Spouse name: Not on file  . Number of children: 3  . Years of education: 8714  . Highest education level: Associate degree: academic program  Occupational History  . 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 Financial resource strain: Not hard at all  . Food insecurity    Worry: Never true    Inability: Never true  . Transportation needs    Medical: No    Non-medical: No  Tobacco Use  . Smoking status: Former Smoker    Packs/day: 0.50    Years: 45.00    Pack years: 22.50    Types: Cigarettes    Start date: 08/15/1962    Quit date: 12/01/2007    Years since quitting: 11.0  . Smokeless tobacco: Never Used  Substance and Sexual Activity  . Alcohol use: No    Alcohol/week: 0.0 standard drinks  . Drug use:  No  . Sexual activity: Not Currently  Lifestyle  . Physical activity    Days per week: 0 days    Minutes per session: 0 min  . Stress: To some extent  Relationships  . Social connections    Talks on phone: More than three times a week    Gets together: More than three times a week    Attends religious service: More than 4 times per year    Active member of club or organization: Yes  Attends meetings of clubs or organizations: More than 4 times per year    Relationship status: Widowed  Other Topics Concern  . Not on file  Social History Narrative  . Not on file    Activities of Daily Living In your present state of health, do you have any difficulty performing the following activities: 12/16/2018  Hearing? N  Vision? Y  Comment blurry vision which she thinks is related to her sugars fluctuating-she did schedule eye exam in November  Difficulty concentrating or making decisions? N  Walking or climbing stairs? Y  Comment has to use a walker all the time, can use the stairs as long as there is a hand rail  Dressing or bathing? N  Doing errands, shopping? N  Preparing Food and eating ? N  Using the Toilet? N  In the past six months, have you accidently leaked urine? Y  Comment wears a pad all the time  Do you have problems with loss of bowel control? 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 N  Managing your Finances? N  Housekeeping or managing your Housekeeping? N  Some recent data might be hidden    Patient Education/ Literacy How often do you need to have someone help you when you read instructions, pamphlets, or other written materials from your doctor or pharmacy?: 1 - Never What is the last grade level you completed in school?: Associates Degree-Early Chiodhood Development  Exercise Current Exercise Habits: The patient does not participate in regular  exercise at present, Exercise limited by: orthopedic condition(s);respiratory conditions(s)  Diet Patient reports consuming 2 meals a day and 1 snack(s) a day Patient reports that her primary diet is: Regular Patient reports that she does have regular access to food.   Depression Screen PHQ 2/9 Scores 12/16/2018 12/12/2018 11/07/2017 10/23/2017 09/03/2017 08/03/2017 05/03/2017  PHQ - 2 Score 0 0 0 0 0 0 0     Fall Risk Fall Risk  12/16/2018 12/12/2018 11/07/2017 10/23/2017 09/03/2017  Falls in the past year? 1 0 Yes No No  Number falls in past yr: 1 - 1 - -  Injury with Fall? 1 - No - -  Risk for fall due to : Impaired mobility - - - -  Risk for fall due to: Comment uses a walker all the time - - - -  Follow up Falls prevention discussed - - - -  Comment get rid of all throw rugs in the house, adequate lighting in the walkways and grab bars in the bathroom - - - -     Objective:  Laura Dalton seemed alert and oriented and she participated appropriately during our telephone visit.  Blood Pressure Weight BMI  BP Readings from Last 3 Encounters:  12/12/18 134/77  09/04/18 (!) 167/63  08/01/18 (!) 147/76   Wt Readings from Last 3 Encounters:  12/12/18 193 lb 9.6 oz (87.8 kg)  09/04/18 193 lb (87.5 kg)  08/01/18 194 lb 9.6 oz (88.3 kg)   BMI Readings from Last 1 Encounters:  12/12/18 30.32 kg/m    *Unable to obtain current vital signs, weight, and BMI due to telephone visit type  Hearing/Vision  . Laura Dalton did not seem to have difficulty with hearing/understanding during the telephone conversation . Reports that she has not had a formal eye exam by an eye care professional within the past year .  Reports that she has not had a formal hearing evaluation within the past year *Unable to fully assess hearing and vision during telephone visit type  Cognitive Function: 6CIT Screen 12/16/2018  What Year? 0 points  What month? 0 points  What time? 0 points  Count back from 20 0 points   Months in reverse 2 points  Repeat phrase 2 points  Total Score 4   (Normal:0-7, Significant for Dysfunction: >8)  Normal Cognitive Function Screening: Yes   Immunization & Health Maintenance Record Immunization History  Administered Date(s) Administered  . Influenza Whole 03/13/2010  . Tdap 03/13/2010    Health Maintenance  Topic Date Due  . MAMMOGRAM  10/10/2018  . COLON CANCER SCREENING ANNUAL FOBT  11/08/2018  . PNA vac Low Risk Adult (1 of 2 - PCV13) 08/01/2019 (Originally 08/15/2014)  . COLONOSCOPY  12/12/2019 (Originally 08/15/1999)  . OPHTHALMOLOGY EXAM  01/08/2019  . HEMOGLOBIN A1C  06/12/2019  . FOOT EXAM  12/12/2019  . TETANUS/TDAP  03/13/2020  . DEXA SCAN  Completed  . Hepatitis C Screening  Completed  . INFLUENZA VACCINE  Discontinued       Assessment  This is a routine wellness examination for [facility].  Health Maintenance: Due or Overdue Health Maintenance Due  Topic Date Due  . MAMMOGRAM  10/10/2018  . COLON CANCER SCREENING ANNUAL FOBT  11/08/2018    Laura Dalton does not need a referral for Community Assistance: Care Management:   no Social Work:    no Prescription Assistance:  no Nutrition/Diabetes Education:  no   Plan:  Personalized Goals Goals Addressed            This Visit's Progress   . DIET - INCREASE WATER INTAKE       Try to drink 6-8 glasses of water daily.      Personalized Health Maintenance & Screening Recommendations  Pt declines all recommended vaccines including Flu, Prevnar and Shingles vaccines  Lung Cancer Screening Recommended: no (Low Dose CT Chest recommended if Age 18-80 years, 30 pack-year currently smoking OR have quit w/in past 15 years) Hepatitis C Screening recommended: no HIV Screening recommended: no  Advanced Directives: Written information was not prepared per patient's request.  Referrals & Orders No orders of the defined types were placed in this encounter.   Follow-up Plan .  Follow-up with Dettinger, Fransisca Kaufmann, MD as planned . Schedule your Mammogram as ordered by Dr Warrick Parisian . Keep your eye exam appointment as scheduled in November   I have personally reviewed and noted the following in the patient's chart:   . Medical and social history . Use of alcohol, tobacco or illicit drugs  . Current medications and supplements . Functional ability and status . Nutritional status . Physical activity . Advanced directives . List of other physicians . Hospitalizations, surgeries, and ER visits in previous 12 months . Vitals . Screenings to include cognitive, depression, and falls . Referrals and appointments  In addition, I have reviewed and discussed with Laura Dalton certain preventive protocols, quality metrics, and best practice recommendations. A written personalized care plan for preventive services as well as general preventive health recommendations is available and can be mailed to the patient at her request.      Milas Hock, LPN  37/08/2829

## 2018-12-16 NOTE — Patient Instructions (Signed)
Preventive Care 69 Years and Older, Female Preventive care refers to lifestyle choices and visits with your health care provider that can promote health and wellness. This includes:  A yearly physical exam. This is also called an annual well check.  Regular dental and eye exams.  Immunizations.  Screening for certain conditions.  Healthy lifestyle choices, such as diet and exercise. What can I expect for my preventive care visit? Physical exam Your health care provider will check:  Height and weight. These may be used to calculate body mass index (BMI), which is a measurement that tells if you are at a healthy weight.  Heart rate and blood pressure.  Your skin for abnormal spots. Counseling Your health care provider may ask you questions about:  Alcohol, tobacco, and drug use.  Emotional well-being.  Home and relationship well-being.  Sexual activity.  Eating habits.  History of falls.  Memory and ability to understand (cognition).  Work and work Statistician.  Pregnancy and menstrual history. What immunizations do I need?  Influenza (flu) vaccine  This is recommended every year. Tetanus, diphtheria, and pertussis (Tdap) vaccine  You may need a Td booster every 10 years. Varicella (chickenpox) vaccine  You may need this vaccine if you have not already been vaccinated. Zoster (shingles) vaccine  You may need this after age 28. Pneumococcal conjugate (PCV13) vaccine  One dose is recommended after age 90. Pneumococcal polysaccharide (PPSV23) vaccine  One dose is recommended after age 63. Measles, mumps, and rubella (MMR) vaccine  You may need at least one dose of MMR if you were born in 1957 or later. You may also need a second dose. Meningococcal conjugate (MenACWY) vaccine  You may need this if you have certain conditions. Hepatitis A vaccine  You may need this if you have certain conditions or if you travel or work in places where you may be exposed  to hepatitis A. Hepatitis B vaccine  You may need this if you have certain conditions or if you travel or work in places where you may be exposed to hepatitis B. Haemophilus influenzae type b (Hib) vaccine  You may need this if you have certain conditions. You may receive vaccines as individual doses or as more than one vaccine together in one shot (combination vaccines). Talk with your health care provider about the risks and benefits of combination vaccines. What tests do I need? Blood tests  Lipid and cholesterol levels. These may be checked every 5 years, or more frequently depending on your overall health.  Hepatitis C test.  Hepatitis B test. Screening  Lung cancer screening. You may have this screening every year starting at age 34 if you have a 30-pack-year history of smoking and currently smoke or have quit within the past 15 years.  Colorectal cancer screening. All adults should have this screening starting at age 34 and continuing until age 1. Your health care provider may recommend screening at age 57 if you are at increased risk. You will have tests every 1-10 years, depending on your results and the type of screening test.  Diabetes screening. This is done by checking your blood sugar (glucose) after you have not eaten for a while (fasting). You may have this done every 1-3 years.  Mammogram. This may be done every 1-2 years. Talk with your health care provider about how often you should have regular mammograms.  BRCA-related cancer screening. This may be done if you have a family history of breast, ovarian, tubal, or peritoneal cancers.  Other tests  Sexually transmitted disease (STD) testing.  Bone density scan. This is done to screen for osteoporosis. You may have this done starting at age 76. Follow these instructions at home: Eating and drinking  Eat a diet that includes fresh fruits and vegetables, whole grains, lean protein, and low-fat dairy products. Limit  your intake of foods with high amounts of sugar, saturated fats, and salt.  Take vitamin and mineral supplements as recommended by your health care provider.  Do not drink alcohol if your health care provider tells you not to drink.  If you drink alcohol: ? Limit how much you have to 0-1 drink a day. ? Be aware of how much alcohol is in your drink. In the [place] one drink equals one 12 oz bottle of beer (355 mL), one 5 oz glass of wine (148 mL), or one 1 oz glass of hard liquor (44 mL). Lifestyle  Take daily care of your teeth and gums.  Stay active. Exercise for at least 30 minutes on 5 or more days each week.  Do not use any products that contain nicotine or tobacco, such as cigarettes, e-cigarettes, and chewing tobacco. If you need help quitting, ask your health care provider.  If you are sexually active, practice safe sex. Use a condom or other form of protection in order to prevent STIs (sexually transmitted infections).  Talk with your health care provider about taking a low-dose aspirin or statin. What's next?  Go to your health care provider once a year for a well check visit.  Ask your health care provider how often you should have your eyes and teeth checked.  Stay up to date on all vaccines. This information is not intended to replace advice given to you by your health care provider. Make sure you discuss any questions you have with your health care provider. Document Released: 03/26/2015 Document Revised: 02/21/2018 Document Reviewed: 02/21/2018 Elsevier Patient Education  2020 [employer].

## 2018-12-19 ENCOUNTER — Ambulatory Visit: Payer: PPO | Attending: Family Medicine | Admitting: Physical Therapy

## 2018-12-19 ENCOUNTER — Other Ambulatory Visit: Payer: Self-pay

## 2018-12-19 DIAGNOSIS — G8929 Other chronic pain: Secondary | ICD-10-CM | POA: Diagnosis not present

## 2018-12-19 DIAGNOSIS — R293 Abnormal posture: Secondary | ICD-10-CM | POA: Diagnosis not present

## 2018-12-19 DIAGNOSIS — M545 Low back pain: Secondary | ICD-10-CM | POA: Insufficient documentation

## 2018-12-19 NOTE — Therapy (Signed)
[facility] [address] [place], [place], [address] Phone: [phone]   Fax:  [phone]  Physical Therapy Evaluation  Patient Details  Name: Laura Dalton MRN: [id number] Date of Birth: [birth year] Referring Provider (PT): Arville Care MD   Encounter Date: 12/19/2018  PT End of Session - 12/19/18 1205    Visit Number  1    Number of Visits  12    Date for PT Re-Evaluation  03/19/19    Authorization Type  PROGRESS NOTE AT 10TH VISIT.  KX MODIFIER AFTER 15 VISITS.    PT Start Time  0945    PT Stop Time  1037    PT Time Calculation (min)  52 min    Activity Tolerance  Patient tolerated treatment well    Behavior During Therapy  WFL for tasks assessed/performed       Past Medical History:  Diagnosis Date  . Bursitis of hip   . Diabetes mellitus without complication (HCC)    type II  . Hyperlipidemia   . Hypertension   . Hypothyroidism   . Low back pain    benign turmor back  . Stroke (HCC)   . Vitamin D deficiency disease     Past Surgical History:  Procedure Laterality Date  . APPENDECTOMY    . BACK SURGERY  1996   turmor on back   . CHOLECYSTECTOMY    . LEG SURGERY Right    "clot and had drain in leg  rt lower leg    There were no vitals filed for this visit.   Subjective Assessment - 12/19/18 1212    Subjective  COVID-19 screen performed prior to patient entering clinic.  The patient presents to the clinic with a 25 year h/o spinal pain.  She reports, however, that her lower back has really started to bother her over the last 2 months.  Sitting decreases her pain.  Walking and satnding increase her pain.  Her pain today is an 8/10 but can rise to severe levels with the aforementioned activities.    Pertinent History  DM, COPD, stroke, hypothyroidism, right leg and back surgery.    How long can you stand comfortably?  15 minutes.    How long can you walk comfortably?  Short distances in community.    Patient Stated Goals  Get out of pain.    Currently in Pain?  Yes    Pain Score  8     Pain Location  Back    Pain Orientation  Right;Left;Lower    Pain Descriptors / Indicators  Aching    Pain Type  Acute pain    Pain Onset  More than a month ago    Pain Frequency  Constant    Aggravating Factors   See above.    Pain Relieving Factors  See above.         [facility] PT Assessment - 12/19/18 0001      Assessment   Medical Diagnosis  Spinal stenosis of lumbosacral region.    Referring Provider (PT)  Arville Care MD    Onset Date/Surgical Date  --   ~2 months.     Precautions   Precautions  Fall      Restrictions   Weight Bearing Restrictions  No      Balance Screen   Has the patient fallen in the past 6 months  Yes    How many times?  --   2.   Has the patient had a  decrease in activity level because of a fear of falling?   Yes    Is the patient reluctant to leave their home because of a fear of falling?   No   Use walker.     [place] residence      Prior Function   Level of Independence  Independent      Posture/Postural Control   Posture/Postural Control  Postural limitations    Postural Limitations  Rounded Shoulders;Forward head;Decreased lumbar lordosis;Increased thoracic kyphosis;Flexed trunk    Posture Comments  Left convexity over most of patient's thoracic and lumbar spine.  Right iliac crest higher than left.  Bilateral genu varum.  The patient stands in 30 degrees of lumbar flexion.      Deep Tendon Reflexes   DTR Assessment Site  Patella;Achilles      ROM / Strength   AROM / PROM / Strength  AROM;Strength      AROM   Overall AROM Comments  The patient demonstrates full lumbar flexion but can only achieve -22 degrees when moving into extension.      Strength   Overall Strength Comments  Bilateral knee and ankle strength is normal.  bilateral hip flexion= 4/5.      Palpation   Palpation comment  Tender to  palpation across the patient's L-S region and and over both SIJ's.      Special Tests   Other special tests  Pain reported with bilateral SLR testing.  No pain reported with FABER testing.      Ambulation/Gait   Gait Comments  The patient walks with a straight cane in a flexed trunk posture.                Objective measurements completed on examination: See above findings.      [facility] Adult PT Treatment/Exercise - 12/19/18 0001      Modalities   Modalities  Electrical Stimulation;Moist Heat      Moist Heat Therapy   Number Minutes Moist Heat  20 Minutes    Moist Heat Location  Lumbar Spine      Electrical Stimulation   Electrical Stimulation Location  Lower lumbar.    Electrical Stimulation Action  IFC    Electrical Stimulation Parameters  80-150 Hz x 20 minutes.    Electrical Stimulation Goals  Pain               PT Short Term Goals - 12/19/18 1231      PT SHORT TERM GOAL #1   Title  STG's=LTG's.        PT Long Term Goals - 12/19/18 1231      PT LONG TERM GOAL #1   Title  Independent with a HEP.    Time  6    Period  Weeks    Status  New      PT LONG TERM GOAL #2   Title  Stand 20 minutes with pain not > 3-4/10.    Time  6    Period  Weeks    Status  New      PT LONG TERM GOAL #3   Title  Perform ADL's with pain not > 3-4/10.    Time  6    Period  Weeks    Status  New             Plan - 12/19/18 1225    Clinical Impression Statement  The patient presents to OPPT with c/o low  back pain.  She has pain across her L-S region and both SIJ's.  She has obvious thoracic and lumbar scoliosis.  She stands in a flexed trunk posture and can only achieve -22 degrees moving into spinal extension.  She walks with a cane for safety and occasionally a FWW.  Patient will benefit from skilled PT intervention to address deficits and pain.    Personal Factors and Comorbidities  Comorbidity 1;Comorbidity 2    Comorbidities  DM, COPD, stroke,  hypothyroidism, right leg and back surgery.    Examination-Activity Limitations  Other;Locomotion Level;Stand    Examination-Participation Restrictions  Other    Stability/Clinical Decision Making  Evolving/Moderate complexity    Clinical Decision Making  Moderate    Rehab Potential  Good    PT Frequency  2x / week    PT Duration  6 weeks    PT Treatment/Interventions  ADLs/Self Care Home Management;Cryotherapy;Electrical Stimulation;Ultrasound;Moist Heat;Therapeutic activities;Therapeutic exercise;Manual techniques;Patient/family education;Passive range of motion    PT Next Visit Plan  Low-level core exercises.  Modalities and STW/M to L-S region.    Consulted and Agree with Plan of Care  Patient       Patient will benefit from skilled therapeutic intervention in order to improve the following deficits and impairments:  Abnormal gait, Decreased activity tolerance, Decreased range of motion, Decreased strength, Pain  Visit Diagnosis: Chronic bilateral low back pain without sciatica - Plan: PT plan of care cert/re-cert  Abnormal posture - Plan: PT plan of care cert/re-cert     Problem List Patient Active Problem List   Diagnosis Date Noted  . Obesity (BMI 30.0-34.9) 03/12/2015  . Hypothyroidism 07/29/2014  . Osteoarthritis of right shoulder 01/21/2014  . Leg length discrepancy 11/04/2013  . Lumbar post-laminectomy syndrome 11/04/2013  . Diabetic peripheral neuropathy associated with type 2 diabetes mellitus (HCC) 11/04/2013  . COPD (chronic obstructive pulmonary disease) (HCC) 10/01/2012  . Chronic back pain 10/01/2012  . Hypertension associated with diabetes (HCC) 10/01/2012  . DM (diabetes mellitus) (HCC) 10/01/2012  . Vitamin D deficiency 10/01/2012  . Hyperlipidemia associated with type 2 diabetes mellitus (HCC) 10/01/2012  . Tinea unguium 10/01/2012    Abhiram Criado, Italy[place]AD MPT 12/19/2018, 12:34 PM  [facility] [address] [place], [place], [address] Phone: [phone]   Fax:  [phone]  Name: Laura AlarVerna Dianne Dalton MRN: [id number] Date of Birth: 02/06/1951

## 2018-12-23 ENCOUNTER — Encounter: Payer: Self-pay | Admitting: Physical Therapy

## 2018-12-23 ENCOUNTER — Ambulatory Visit: Payer: PPO | Admitting: Physical Therapy

## 2018-12-23 ENCOUNTER — Other Ambulatory Visit: Payer: Self-pay

## 2018-12-23 DIAGNOSIS — R293 Abnormal posture: Secondary | ICD-10-CM

## 2018-12-23 DIAGNOSIS — G8929 Other chronic pain: Secondary | ICD-10-CM

## 2018-12-23 DIAGNOSIS — M545 Low back pain: Secondary | ICD-10-CM | POA: Diagnosis not present

## 2018-12-23 NOTE — Therapy (Signed)
[facility] [place], [place], [address] Phone: [phone]   Fax:  [phone]  Physical Therapy Treatment  Patient Details  Name: Laura Dalton MRN: [id number] Date of Birth: [birth year] Referring Provider (PT): Caryl Pina MD   Encounter Date: 12/23/2018  PT End of Session - 12/23/18 1153    Visit Number  2    Number of Visits  12    Date for PT Re-Evaluation  03/19/19    Authorization Type  PROGRESS NOTE AT 10TH VISIT.  KX MODIFIER AFTER 15 VISITS.    PT Start Time  1115    PT Stop Time  1203    PT Time Calculation (min)  48 min    Activity Tolerance  Patient tolerated treatment well;Patient limited by pain    Behavior During Therapy  [facility] for tasks assessed/performed       Past Medical History:  Diagnosis Date  . Bursitis of hip   . Diabetes mellitus without complication ([place])    type II  . Hyperlipidemia   . Hypertension   . Hypothyroidism   . Low back pain    benign turmor back  . Stroke ([place])   . Vitamin D deficiency disease     Past Surgical History:  Procedure Laterality Date  . APPENDECTOMY    . BACK SURGERY  1996   turmor on back   . CHOLECYSTECTOMY    . LEG SURGERY Right    "clot and had drain in leg  rt lower leg    There were no vitals filed for this visit.  Subjective Assessment - 12/23/18 1119    Subjective  COVID-19 screen performed prior to patient entering clinic. Painful in back upon arrival    Pertinent History  DM, COPD, stroke, hypothyroidism, right leg and back surgery.    How long can you stand comfortably?  15 minutes.    How long can you walk comfortably?  Short distances in community.    Patient Stated Goals  Get out of pain.    Currently in Pain?  Yes    Pain Score  8     Pain Location  Back    Pain Descriptors / Indicators  Aching    Pain Type  Acute pain    Pain Onset  More than a month ago    Pain Frequency  Constant    Aggravating Factors   any prolong  activity standing    Pain Relieving Factors  laying at rest/meds                       [facility] Adult PT Treatment/Exercise - 12/23/18 0001      Self-Care   Self-Care  ADL's;Lifting;Posture;Other Self-Care Comments    Other Self-Care Comments   HEP provided for the above      Exercises   Exercises  Lumbar      Lumbar Exercises: Seated   Other Seated Lumbar Exercises  sitting core actvaion 5sec x10      Lumbar Exercises: Supine   Ab Set  5 reps;3 seconds    Glut Set  5 reps;3 seconds    Clam  10 reps   with core activation   Other Supine Lumbar Exercises  ball squeeze 3sec x10      Moist Heat Therapy   Number Minutes Moist Heat  15 Minutes    Moist Heat Location  Lumbar Spine      Electrical Stimulation   Electrical Stimulation  Location  Lower lumbar.    Electrical Stimulation Action  IFC    Electrical Stimulation Parameters  80-150hz  x5515min    Electrical Stimulation Goals  Pain      Manual Therapy   Manual Therapy  Soft tissue mobilization    Manual therapy comments  manual gentle STW to bil low back paraspinals to reduce pain and tone             PT Education - 12/23/18 1152    Education Details  Posture awareness techniques and core activation    Person(s) Educated  Patient    Methods  Explanation;Demonstration;Handout    Comprehension  Verbalized understanding;Returned demonstration       PT Short Term Goals - 12/19/18 1231      PT SHORT TERM GOAL #1   Title  STG's=LTG's.        PT Long Term Goals - 12/19/18 1231      PT LONG TERM GOAL #1   Title  Independent with a HEP.    Time  6    Period  Weeks    Status  New      PT LONG TERM GOAL #2   Title  Stand 20 minutes with pain not > 3-4/10.    Time  6    Period  Weeks    Status  New      PT LONG TERM GOAL #3   Title  Perform ADL's with pain not > 3-4/10.    Time  6    Period  Weeks    Status  New            Plan - 12/23/18 1201    Clinical Impression Statement   Patient tolerated treatment fair today due to increased pain in low back. Today focused on education for core actvation technique and posture awareness techniques with HEP provided following manual STW and modalities to reduce pain. Patient has tenderness and tightness bil low back area. Patient current goals ongoing.    Personal Factors and Comorbidities  Comorbidity 1;Comorbidity 2    Comorbidities  DM, COPD, stroke, hypothyroidism, right leg and back surgery.    Examination-Activity Limitations  Other;Locomotion Level;Stand    Stability/Clinical Decision Making  Evolving/Moderate complexity    Rehab Potential  Good    PT Frequency  2x / week    PT Treatment/Interventions  ADLs/Self Care Home Management;Cryotherapy;Electrical Stimulation;Ultrasound;Moist Heat;Therapeutic activities;Therapeutic exercise;Manual techniques;Patient/family education;Passive range of motion    PT Next Visit Plan  cont with Low-level core exercises.  Modalities and STW/M to L-S region. may try nustep low level    Consulted and Agree with Plan of Care  Patient       Patient will benefit from skilled therapeutic intervention in order to improve the following deficits and impairments:  Abnormal gait, Decreased activity tolerance, Decreased range of motion, Decreased strength, Pain  Visit Diagnosis: Chronic bilateral low back pain without sciatica  Abnormal posture     Problem List Patient Active Problem List   Diagnosis Date Noted  . Obesity (BMI 30.0-34.9) 03/12/2015  . Hypothyroidism 07/29/2014  . Osteoarthritis of right shoulder 01/21/2014  . Leg length discrepancy 11/04/2013  . Lumbar post-laminectomy syndrome 11/04/2013  . Diabetic peripheral neuropathy associated with type 2 diabetes mellitus (HCC) 11/04/2013  . COPD (chronic obstructive pulmonary disease) (HCC) 10/01/2012  . Chronic back pain 10/01/2012  . Hypertension associated with diabetes (HCC) 10/01/2012  . DM (diabetes mellitus) (HCC)  10/01/2012  . Vitamin D deficiency 10/01/2012  .  Hyperlipidemia associated with type 2 diabetes mellitus (HCC) 10/01/2012  . Tinea unguium 10/01/2012    Corderro Koloski P, PTA 12/23/2018, 12:13 PM  [facility] [address] [place], [place], [address] Phone: [phone]   Fax:  [phone]  Name: Keeghan Mcintire MRN: [id number] Date of Birth: 07-Nov-1950

## 2018-12-23 NOTE — Patient Instructions (Signed)
Pelvic Tilt: Posterior - Legs Bent (Supine)   Tighten stomach and flatten back by rolling pelvis down. Hold _10___ seconds. Relax. Repeat _10-30___ times per set. Do __2__ sets per session. Do _2___ sessions per day.Pelvic Tilt: Posterior - Legs Bent (Supine)  Brushing Teeth    Place one foot on ledge and one hand on counter. Bend other knee slightly to keep back straight.  Copyright  VHI. All rights reserved.  Refrigerator   Squat with knees apart to reach lower shelves and drawers.   Copyright  VHI. All rights reserved.  Laundry [employer] down and hold basket close to stand. Use leg muscles to do the work.   Copyright  VHI. All rights reserved.  Housework - Vacuuming   Hold the vacuum with arm held at side. Step back and forth to move it, keeping head up. Avoid twisting.   Copyright  VHI. All rights reserved.  Housework - Wiping   Position yourself as close as possible to reach work surface. Avoid straining your back.   Copyright  VHI. All rights reserved.  Gardening - Mowing   Keep arms close to sides and walk with lawn mower.   Copyright  VHI. All rights reserved.  Sleeping on Side   Place pillow between knees. Use cervical support under neck and a roll around waist as needed.   Copyright  VHI. All rights reserved.  Log Roll   Lying on back, bend left knee and place left arm across chest. Roll all in one movement to the right. Reverse to roll to the left. Always move as one unit.   Copyright  VHI. All rights reserved.  Stand to Sit / Sit to Stand   To sit: Bend knees to lower self onto front edge of chair, then scoot back on seat. To stand: Reverse sequence by placing one foot forward, and scoot to front of seat. Use rocking motion to stand up.  Copyright  VHI. All rights reserved.  Posture - Standing   Good posture is important. Avoid slouching and forward head thrust. Maintain curve in low back and align ears over shoul-  ders, hips over ankles.   Copyright  VHI. All rights reserved.  Posture - Sitting   Sit upright, head facing forward. Try using a roll to support lower back. Keep shoulders relaxed, and avoid rounded back. Keep hips level with knees. Avoid crossing legs for long periods.   Copyright  VHI. All rights reserved.  Computer Work   Position work to Programmer, multimedia. Use proper work and seat height. Keep shoulders back and down, wrists straight, and elbows at right angles. Use chair that provides full back support. Add footrest and lumbar roll as needed.   Copyright  VHI. All rights reserved.

## 2018-12-26 ENCOUNTER — Other Ambulatory Visit: Payer: Self-pay

## 2018-12-26 ENCOUNTER — Ambulatory Visit: Payer: PPO | Admitting: Physical Therapy

## 2018-12-26 ENCOUNTER — Encounter: Payer: Self-pay | Admitting: Physical Therapy

## 2018-12-26 DIAGNOSIS — R293 Abnormal posture: Secondary | ICD-10-CM

## 2018-12-26 DIAGNOSIS — G8929 Other chronic pain: Secondary | ICD-10-CM

## 2018-12-26 DIAGNOSIS — M545 Low back pain: Secondary | ICD-10-CM | POA: Diagnosis not present

## 2018-12-26 NOTE — Therapy (Signed)
[facility] [place], [place], [address] Phone: [phone]   Fax:  [phone]  Physical Therapy Treatment  Patient Details  Name: Laura Dalton MRN: [id number] Date of Birth: [birth year] Referring Provider (PT): Caryl Pina MD   Encounter Date: 12/26/2018  PT End of Session - 12/26/18 1150    Visit Number  3    Number of Visits  12    Date for PT Re-Evaluation  03/19/19    Authorization Type  PROGRESS NOTE AT 10TH VISIT.  KX MODIFIER AFTER 15 VISITS.    PT Start Time  1115    PT Stop Time  1203    PT Time Calculation (min)  48 min    Activity Tolerance  Patient tolerated treatment well;Patient limited by pain    Behavior During Therapy  [facility] for tasks assessed/performed       Past Medical History:  Diagnosis Date  . Bursitis of hip   . Diabetes mellitus without complication ([place])    type II  . Hyperlipidemia   . Hypertension   . Hypothyroidism   . Low back pain    benign turmor back  . Stroke ([place])   . Vitamin D deficiency disease     Past Surgical History:  Procedure Laterality Date  . APPENDECTOMY    . BACK SURGERY  1996   turmor on back   . CHOLECYSTECTOMY    . LEG SURGERY Right    "clot and had drain in leg  rt lower leg    There were no vitals filed for this visit.  Subjective Assessment - 12/26/18 1128    Subjective  COVID-19 screen performed prior to patient entering clinic. Patient did well last treatment with ongoing discomfort today    Pertinent History  DM, COPD, stroke, hypothyroidism, right leg and back surgery.    How long can you stand comfortably?  15 minutes.    How long can you walk comfortably?  Short distances in community.    Patient Stated Goals  Get out of pain.    Currently in Pain?  Yes    Pain Score  7     Pain Location  Back    Pain Orientation  Right;Left;Lower    Pain Descriptors / Indicators  Discomfort    Pain Type  Acute pain    Pain Onset  More than a  month ago    Aggravating Factors   prolong activity and certain movements    Pain Relieving Factors  at rest and meds                       [facility] Adult PT Treatment/Exercise - 12/26/18 0001      Lumbar Exercises: Aerobic   Nustep  L2 x35min core posture focus      Lumbar Exercises: Supine   Ab Set  5 reps;5 seconds    Glut Set  5 reps;5 seconds    Clam  3 seconds   2x10 w yellow t-band   Other Supine Lumbar Exercises  ball squeeze 3sec 2x10      Moist Heat Therapy   Number Minutes Moist Heat  15 Minutes    Moist Heat Location  Lumbar Spine      Electrical Stimulation   Electrical Stimulation Location  Lower lumbar.    Electrical Stimulation Action  IFC    Electrical Stimulation Parameters  80-150hz  x52min    Electrical Stimulation Goals  Pain  Manual Therapy   Manual Therapy  Soft tissue mobilization    Manual therapy comments  manual gentle STW to bil low back paraspinals to reduce pain and tone               PT Short Term Goals - 12/19/18 1231      PT SHORT TERM GOAL #1   Title  STG's=LTG's.        PT Long Term Goals - 12/26/18 1158      PT LONG TERM GOAL #1   Title  Independent with a HEP.    Time  6    Period  Weeks    Status  On-going      PT LONG TERM GOAL #2   Title  Stand 20 minutes with pain not > 3-4/10.    Time  6    Period  Weeks    Status  On-going   7/10 and up 12/26/18     PT LONG TERM GOAL #3   Title  Perform ADL's with pain not > 3-4/10.    Time  6    Period  Weeks    Status  On-going   7/10 and above 12/26/18           Plan - 12/26/18 1151    Clinical Impression Statement  Patient tolerated treatment fair today. Patient continues to have low back pain esp with movement and activity. Today focused on genlte core progression followed by very gentle manual STW. Patient unable to tolerate deep pressure due to nerve pain. Patient current goals ongoing due to pain deficts.    Personal Factors and  Comorbidities  Comorbidity 1;Comorbidity 2    Comorbidities  DM, COPD, stroke, hypothyroidism, right leg and back surgery.    Examination-Activity Limitations  Other;Locomotion Level;Stand    Examination-Participation Restrictions  Other    Stability/Clinical Decision Making  Evolving/Moderate complexity    Rehab Potential  Good    PT Frequency  2x / week    PT Duration  6 weeks    PT Treatment/Interventions  ADLs/Self Care Home Management;Cryotherapy;Electrical Stimulation;Ultrasound;Moist Heat;Therapeutic activities;Therapeutic exercise;Manual techniques;Patient/family education;Passive range of motion    PT Next Visit Plan  cont with Low-level core exercises.  Modalities and STW/M to L-S region. may try nustep low level    Consulted and Agree with Plan of Care  Patient       Patient will benefit from skilled therapeutic intervention in order to improve the following deficits and impairments:  Abnormal gait, Decreased activity tolerance, Decreased range of motion, Decreased strength, Pain  Visit Diagnosis: Abnormal posture  Chronic bilateral low back pain without sciatica     Problem List Patient Active Problem List   Diagnosis Date Noted  . Obesity (BMI 30.0-34.9) 03/12/2015  . Hypothyroidism 07/29/2014  . Osteoarthritis of right shoulder 01/21/2014  . Leg length discrepancy 11/04/2013  . Lumbar post-laminectomy syndrome 11/04/2013  . Diabetic peripheral neuropathy associated with type 2 diabetes mellitus (HCC) 11/04/2013  . COPD (chronic obstructive pulmonary disease) (HCC) 10/01/2012  . Chronic back pain 10/01/2012  . Hypertension associated with diabetes (HCC) 10/01/2012  . DM (diabetes mellitus) (HCC) 10/01/2012  . Vitamin D deficiency 10/01/2012  . Hyperlipidemia associated with type 2 diabetes mellitus (HCC) 10/01/2012  . Tinea unguium 10/01/2012    Nyemah Watton P, PTA 12/26/2018, 12:07 PM  [facility] [address] [place], [place], [address] Phone: [phone]   Fax:  [phone]  Name: Laura Dalton MRN: [id number] Date  of Birth: 10/05/1950

## 2018-12-31 ENCOUNTER — Other Ambulatory Visit: Payer: Self-pay

## 2018-12-31 ENCOUNTER — Ambulatory Visit: Payer: PPO | Admitting: *Deleted

## 2018-12-31 ENCOUNTER — Encounter: Payer: Self-pay | Admitting: *Deleted

## 2018-12-31 DIAGNOSIS — R293 Abnormal posture: Secondary | ICD-10-CM

## 2018-12-31 DIAGNOSIS — M545 Low back pain: Secondary | ICD-10-CM | POA: Diagnosis not present

## 2018-12-31 DIAGNOSIS — G8929 Other chronic pain: Secondary | ICD-10-CM

## 2018-12-31 NOTE — Therapy (Signed)
[facility] [address] [place], [place], [address] Phone: [phone]   Fax:  [phone]  Physical Therapy Treatment  Patient Details  Name: Laura Dalton MRN: [id number] Date of Birth: 08-25-1950 Referring Provider (PT): Arville Care MD   Encounter Date: 12/31/2018  PT End of Session - 12/31/18 1036    Visit Number  4    Number of Visits  12    Date for PT Re-Evaluation  03/19/19    Authorization Type  PROGRESS NOTE AT 10TH VISIT.  KX MODIFIER AFTER 15 VISITS.    PT Start Time  1030    PT Stop Time  1120    PT Time Calculation (min)  50 min       Past Medical History:  Diagnosis Date  . Bursitis of hip   . Diabetes mellitus without complication (HCC)    type II  . Hyperlipidemia   . Hypertension   . Hypothyroidism   . Low back pain    benign turmor back  . Stroke (HCC)   . Vitamin D deficiency disease     Past Surgical History:  Procedure Laterality Date  . APPENDECTOMY    . BACK SURGERY  1996   turmor on back   . CHOLECYSTECTOMY    . LEG SURGERY Right    "clot and had drain in leg  rt lower leg    There were no vitals filed for this visit.  Subjective Assessment - 12/31/18 1037    Subjective  COVID-19 screen performed prior to patient entering clinic. Patient did well last treatment with ongoing discomfort today    Pertinent History  DM, COPD, stroke, hypothyroidism, right leg and back surgery.    How long can you stand comfortably?  15 minutes.    How long can you walk comfortably?  Short distances in community.    Patient Stated Goals  Get out of pain.    Currently in Pain?  Yes    Pain Score  7     Pain Location  Back    Pain Orientation  Right;Left    Pain Type  Acute pain    Pain Onset  More than a month ago                       [facility] Adult PT Treatment/Exercise - 12/31/18 0001      Exercises   Exercises  Lumbar      Lumbar Exercises: Aerobic   Nustep  L4 x58min core  posture focus      Lumbar Exercises: Supine   Ab Set  5 seconds;10 reps    Glut Set  5 reps;5 seconds    Clam  3 seconds   2x10 w yellow t-band   Bent Knee Raise  10 reps;3 seconds    Other Supine Lumbar Exercises  ball squeeze 3sec 2x10      Moist Heat Therapy   Number Minutes Moist Heat  15 Minutes    Moist Heat Location  Lumbar Spine      Electrical Stimulation   Electrical Stimulation Location  Lower lumbar.    Electrical Stimulation Action  IFC    Electrical Stimulation Parameters  sitting 80-150hz  x 15 mins    Electrical Stimulation Goals  Pain      Manual Therapy   Manual Therapy  --    Manual therapy comments  --               PT  Short Term Goals - 12/19/18 1231      PT SHORT TERM GOAL #1   Title  STG's=LTG's.        PT Long Term Goals - 12/26/18 1158      PT LONG TERM GOAL #1   Title  Independent with a HEP.    Time  6    Period  Weeks    Status  On-going      PT LONG TERM GOAL #2   Title  Stand 20 minutes with pain not > 3-4/10.    Time  6    Period  Weeks    Status  On-going   7/10 and up 12/26/18     PT LONG TERM GOAL #3   Title  Perform ADL's with pain not > 3-4/10.    Time  6    Period  Weeks    Status  On-going   7/10 and above 12/26/18           Plan - 12/31/18 1259    Clinical Impression Statement  Pt arrived today doing fair with LBP. Today's Rx consisted of progression of core activation exs and did very well. She requested performing modalities in sitting and did well with normal response. Discussed performing some exs in the chair at home.    Comorbidities  DM, COPD, stroke, hypothyroidism, right leg and back surgery.    Examination-Activity Limitations  Other;Locomotion Level;Stand    Examination-Participation Restrictions  Other    Stability/Clinical Decision Making  Evolving/Moderate complexity    Rehab Potential  Good    PT Frequency  2x / week    PT Duration  6 weeks    PT Treatment/Interventions  ADLs/Self Care  Home Management;Cryotherapy;Electrical Stimulation;Ultrasound;Moist Heat;Therapeutic activities;Therapeutic exercise;Manual techniques;Patient/family education;Passive range of motion    PT Next Visit Plan  cont with Low-level core exercises.  Modalities and STW/M to L-S region. may try nustep low level    Consulted and Agree with Plan of Care  Patient       Patient will benefit from skilled therapeutic intervention in order to improve the following deficits and impairments:  Abnormal gait, Decreased activity tolerance, Decreased range of motion, Decreased strength, Pain  Visit Diagnosis: Abnormal posture  Chronic bilateral low back pain without sciatica     Problem List Patient Active Problem List   Diagnosis Date Noted  . Obesity (BMI 30.0-34.9) 03/12/2015  . Hypothyroidism 07/29/2014  . Osteoarthritis of right shoulder 01/21/2014  . Leg length discrepancy 11/04/2013  . Lumbar post-laminectomy syndrome 11/04/2013  . Diabetic peripheral neuropathy associated with type 2 diabetes mellitus ([place]) 11/04/2013  . COPD (chronic obstructive pulmonary disease) ([place]) 10/01/2012  . Chronic back pain 10/01/2012  . Hypertension associated with diabetes ([place]) 10/01/2012  . DM (diabetes mellitus) ([place]) 10/01/2012  . Vitamin D deficiency 10/01/2012  . Hyperlipidemia associated with type 2 diabetes mellitus ([place]) 10/01/2012  . Tinea unguium 10/01/2012    Milt Coye,CHRIS, PTA 12/31/2018, 2:01 PM  [facility] [place], [place], [address] Phone: [phone]   Fax:  [phone]  Name: Kameron Glazebrook MRN: [id number] Date of Birth: 1950/03/25

## 2019-01-02 ENCOUNTER — Other Ambulatory Visit: Payer: Self-pay

## 2019-01-02 ENCOUNTER — Ambulatory Visit: Payer: PPO | Admitting: Physical Therapy

## 2019-01-02 ENCOUNTER — Encounter: Payer: Self-pay | Admitting: Physical Therapy

## 2019-01-02 DIAGNOSIS — R293 Abnormal posture: Secondary | ICD-10-CM

## 2019-01-02 DIAGNOSIS — G8929 Other chronic pain: Secondary | ICD-10-CM

## 2019-01-02 DIAGNOSIS — M545 Low back pain, unspecified: Secondary | ICD-10-CM

## 2019-01-02 NOTE — Therapy (Signed)
[facility] [place], [place], [address] Phone: [phone]   Fax:  [phone]  Physical Therapy Treatment  Patient Details  Name: Laura Dalton MRN: [id number] Date of Birth: [birth year] Referring Provider (PT): Caryl Pina MD   Encounter Date: 01/02/2019  PT End of Session - 01/02/19 1011    Visit Number  5    Number of Visits  12    Date for PT Re-Evaluation  03/19/19    Authorization Type  PROGRESS NOTE AT 10TH VISIT.  KX MODIFIER AFTER 15 VISITS.    PT Start Time  [id number]    PT Stop Time  1031    PT Time Calculation (min)  45 min    Activity Tolerance  Patient tolerated treatment well;Patient limited by pain    Behavior During Therapy  [facility] for tasks assessed/performed       Past Medical History:  Diagnosis Date  . Bursitis of hip   . Diabetes mellitus without complication ([place])    type II  . Hyperlipidemia   . Hypertension   . Hypothyroidism   . Low back pain    benign turmor back  . Stroke ([place])   . Vitamin D deficiency disease     Past Surgical History:  Procedure Laterality Date  . APPENDECTOMY    . BACK SURGERY  1996   turmor on back   . CHOLECYSTECTOMY    . LEG SURGERY Right    "clot and had drain in leg  rt lower leg    There were no vitals filed for this visit.  Subjective Assessment - 01/02/19 0949    Subjective  COVID-19 screen performed prior to patient entering clinic. Patient did well last treatment no new complaints today    Pertinent History  DM, COPD, stroke, hypothyroidism, right leg and back surgery.    How long can you stand comfortably?  15 minutes.    How long can you walk comfortably?  Short distances in community.    Patient Stated Goals  Get out of pain.    Currently in Pain?  Yes    Pain Score  5     Pain Location  Back    Pain Orientation  Right;Left    Pain Descriptors / Indicators  Discomfort    Pain Type  Acute pain    Pain Onset  More than a month ago    Pain Frequency  Constant    Aggravating Factors   prolong activity    Pain Relieving Factors  meds and tens                       [facility] Adult PT Treatment/Exercise - 01/02/19 0001      Lumbar Exercises: Aerobic   Nustep  L4 x11 min core posture focus      Lumbar Exercises: Supine   Ab Set  5 seconds;10 reps    Glut Set  5 reps;5 seconds    Clam  3 seconds;20 reps   yellow t-band   Bent Knee Raise  3 seconds   2x15 each   Straight Leg Raise  2 seconds   2x10   Other Supine Lumbar Exercises  ball squeeze 3sec 2x10      Moist Heat Therapy   Number Minutes Moist Heat  15 Minutes    Moist Heat Location  Lumbar Spine      Electrical Stimulation   Electrical Stimulation Location  Lower lumbar.    Dealer  Stimulation Action  IFC    Electrical Stimulation Parameters  sitting 80-150hz  x50min    Electrical Stimulation Goals  Pain               PT Short Term Goals - 12/19/18 1231      PT SHORT TERM GOAL #1   Title  STG's=LTG's.        PT Long Term Goals - 01/02/19 1010      PT LONG TERM GOAL #1   Title  Independent with a HEP.    Time  6    Period  Weeks    Status  On-going      PT LONG TERM GOAL #2   Title  Stand 20 minutes with pain not > 3-4/10.    Time  6    Period  Weeks    Status  On-going      PT LONG TERM GOAL #3   Title  Perform ADL's with pain not > 3-4/10.    Time  6    Period  Weeks    Status  On-going            Plan - 01/02/19 1020    Clinical Impression Statement  Patient tolerated treatment fair due to ongoing pain. Patient has been doing a lot of standing to can food into jars. Patient able to progress with exercises today yet very painful transition from sit to supine. Patient has less pain upon arrival yet 8/10 pain last niht per reported. Patient current goals ongoing due to limitations.    Personal Factors and Comorbidities  Comorbidity 1;Comorbidity 2    Comorbidities  DM, COPD, stroke, hypothyroidism, right  leg and back surgery.    Examination-Activity Limitations  Other;Locomotion Level;Stand    Examination-Participation Restrictions  Other    Stability/Clinical Decision Making  Evolving/Moderate complexity    Rehab Potential  Good    PT Frequency  2x / week    PT Duration  6 weeks    PT Treatment/Interventions  ADLs/Self Care Home Management;Cryotherapy;Electrical Stimulation;Ultrasound;Moist Heat;Therapeutic activities;Therapeutic exercise;Manual techniques;Patient/family education;Passive range of motion    PT Next Visit Plan  cont with Low-level core exercises.  Modalities and STW/M to L-S region.consider standing core exercises and posture strengthening    Consulted and Agree with Plan of Care  Patient       Patient will benefit from skilled therapeutic intervention in order to improve the following deficits and impairments:  Abnormal gait, Decreased activity tolerance, Decreased range of motion, Decreased strength, Pain  Visit Diagnosis: Abnormal posture  Chronic bilateral low back pain without sciatica     Problem List Patient Active Problem List   Diagnosis Date Noted  . Obesity (BMI 30.0-34.9) 03/12/2015  . Hypothyroidism 07/29/2014  . Osteoarthritis of right shoulder 01/21/2014  . Leg length discrepancy 11/04/2013  . Lumbar post-laminectomy syndrome 11/04/2013  . Diabetic peripheral neuropathy associated with type 2 diabetes mellitus (HCC) 11/04/2013  . COPD (chronic obstructive pulmonary disease) (HCC) 10/01/2012  . Chronic back pain 10/01/2012  . Hypertension associated with diabetes (HCC) 10/01/2012  . DM (diabetes mellitus) (HCC) 10/01/2012  . Vitamin D deficiency 10/01/2012  . Hyperlipidemia associated with type 2 diabetes mellitus (HCC) 10/01/2012  . Tinea unguium 10/01/2012    Ponciano Shealy P, PTA 01/02/2019, 10:38 AM  [facility] [address] [place], [place], [address] Phone: [phone]   Fax:   [phone]  Name: Laura Dalton MRN: [id number] Date of Birth: 1950-04-10

## 2019-01-06 ENCOUNTER — Ambulatory Visit: Payer: PPO | Admitting: Physical Therapy

## 2019-01-07 ENCOUNTER — Ambulatory Visit: Payer: PPO | Admitting: *Deleted

## 2019-01-07 ENCOUNTER — Other Ambulatory Visit: Payer: Self-pay

## 2019-01-07 DIAGNOSIS — M545 Low back pain: Secondary | ICD-10-CM | POA: Diagnosis not present

## 2019-01-07 DIAGNOSIS — R293 Abnormal posture: Secondary | ICD-10-CM

## 2019-01-07 DIAGNOSIS — G8929 Other chronic pain: Secondary | ICD-10-CM

## 2019-01-07 NOTE — Therapy (Signed)
[facility] [address] [place], [place], [address] Phone: [phone]   Fax:  [phone]  Physical Therapy Treatment  Patient Details  Name: Laura Dalton MRN: [id number] Date of Birth: 12-29-1950 Referring Provider (PT): Arville Care MD   Encounter Date: 01/07/2019  PT End of Session - 01/07/19 1034    Visit Number  6    Number of Visits  12    Date for PT Re-Evaluation  03/19/19    Authorization Type  PROGRESS NOTE AT 10TH VISIT.  KX MODIFIER AFTER 15 VISITS.    PT Start Time  1030    PT Stop Time  1120    PT Time Calculation (min)  50 min       Past Medical History:  Diagnosis Date  . Bursitis of hip   . Diabetes mellitus without complication (HCC)    type II  . Hyperlipidemia   . Hypertension   . Hypothyroidism   . Low back pain    benign turmor back  . Stroke (HCC)   . Vitamin D deficiency disease     Past Surgical History:  Procedure Laterality Date  . APPENDECTOMY    . BACK SURGERY  1996   turmor on back   . CHOLECYSTECTOMY    . LEG SURGERY Right    "clot and had drain in leg  rt lower leg    There were no vitals filed for this visit.  Subjective Assessment - 01/07/19 1032    Subjective  COVID-19 screen performed prior to patient entering clinic. Very tired today. Have been " canning"  a lot at home    Pertinent History  DM, COPD, stroke, hypothyroidism, right leg and back surgery.    How long can you stand comfortably?  15 minutes.    How long can you walk comfortably?  Short distances in community.    Patient Stated Goals  Get out of pain.    Currently in Pain?  Yes    Pain Score  5     Pain Location  Back    Pain Orientation  Right;Left    Pain Descriptors / Indicators  Discomfort    Pain Type  Acute pain    Pain Onset  More than a month ago                       [facility] Adult PT Treatment/Exercise - 01/07/19 0001      Exercises   Exercises  Lumbar;Knee/Hip      Lumbar  Exercises: Aerobic   Nustep  L4 x13 min core posture focus      Lumbar Exercises: Seated   Other Seated Lumbar Exercises  sitting core actvaion 5sec x10, Red tband rows 2x10 , 2# ball reachouts 2x10      Knee/Hip Exercises: Seated   Ball Squeeze  2x10 hold 5 secs with drawin    Clamshell with TheraBand  Red    3x 10   with drawin   Marching  Both;3 sets;10 reps   with drawin     Moist Heat Therapy   Number Minutes Moist Heat  15 Minutes    Moist Heat Location  Lumbar Spine      Electrical Stimulation   Electrical Stimulation Location  Lower lumbar.    Electrical Stimulation Action  IFC    Electrical Stimulation Parameters  sitting 80-150hz  x 15 mins    Electrical Stimulation Goals  Pain  PT Short Term Goals - 12/19/18 1231      PT SHORT TERM GOAL #1   Title  STG's=LTG's.        PT Long Term Goals - 01/02/19 1010      PT LONG TERM GOAL #1   Title  Independent with a HEP.    Time  6    Period  Weeks    Status  On-going      PT LONG TERM GOAL #2   Title  Stand 20 minutes with pain not > 3-4/10.    Time  6    Period  Weeks    Status  On-going      PT LONG TERM GOAL #3   Title  Perform ADL's with pain not > 3-4/10.    Time  6    Period  Weeks    Status  On-going            Plan - 01/07/19 1252    Clinical Impression Statement  Pt arrived today feeling fairly tired due to doing a lot of standing and  "canning" at home. She was able to perform therex and core strengthing in sitting and did well. LBP was still lower today after therex 5/10. Normal modality response today    Personal Factors and Comorbidities  Comorbidity 1;Comorbidity 2    Comorbidities  DM, COPD, stroke, hypothyroidism, right leg and back surgery.    Examination-Activity Limitations  Other;Locomotion Level;Stand    Stability/Clinical Decision Making  Evolving/Moderate complexity    PT Frequency  2x / week    PT Duration  6 weeks    PT Treatment/Interventions  ADLs/Self  Care Home Management;Cryotherapy;Electrical Stimulation;Ultrasound;Moist Heat;Therapeutic activities;Therapeutic exercise;Manual techniques;Patient/family education;Passive range of motion    PT Next Visit Plan  cont with Low-level core exercises.  Modalities and STW/M to L-S region.consider standing core exercises and posture strengthening    Consulted and Agree with Plan of Care  Patient       Patient will benefit from skilled therapeutic intervention in order to improve the following deficits and impairments:  Abnormal gait, Decreased activity tolerance, Decreased range of motion, Decreased strength, Pain  Visit Diagnosis: Abnormal posture  Chronic bilateral low back pain without sciatica     Problem List Patient Active Problem List   Diagnosis Date Noted  . Obesity (BMI 30.0-34.9) 03/12/2015  . Hypothyroidism 07/29/2014  . Osteoarthritis of right shoulder 01/21/2014  . Leg length discrepancy 11/04/2013  . Lumbar post-laminectomy syndrome 11/04/2013  . Diabetic peripheral neuropathy associated with type 2 diabetes mellitus ([place]) 11/04/2013  . COPD (chronic obstructive pulmonary disease) ([place]) 10/01/2012  . Chronic back pain 10/01/2012  . Hypertension associated with diabetes ([place]) 10/01/2012  . DM (diabetes mellitus) ([place]) 10/01/2012  . Vitamin D deficiency 10/01/2012  . Hyperlipidemia associated with type 2 diabetes mellitus ([place]) 10/01/2012  . Tinea unguium 10/01/2012    ,CHRIS, PTA 01/07/2019, 1:11 PM  [facility] [place], [place], [address] Phone: [phone]   Fax:  [phone]  Name: Laura Dalton MRN: [id number] Date of Birth: December 12, 1950

## 2019-01-08 ENCOUNTER — Ambulatory Visit: Payer: PPO | Admitting: Physical Therapy

## 2019-01-08 ENCOUNTER — Other Ambulatory Visit: Payer: Self-pay

## 2019-01-08 ENCOUNTER — Encounter: Payer: Self-pay | Admitting: Physical Therapy

## 2019-01-08 DIAGNOSIS — R293 Abnormal posture: Secondary | ICD-10-CM

## 2019-01-08 DIAGNOSIS — G8929 Other chronic pain: Secondary | ICD-10-CM

## 2019-01-08 DIAGNOSIS — M545 Low back pain: Secondary | ICD-10-CM | POA: Diagnosis not present

## 2019-01-08 NOTE — Therapy (Signed)
[facility] [place], [place], [address] Phone: [phone]   Fax:  [phone]  Physical Therapy Treatment  Patient Details  Name: Laura Dalton MRN: [id number] Date of Birth: [birth year] Referring Provider (PT): Caryl Pina MD   Encounter Date: 01/08/2019  PT End of Session - 01/08/19 0951    Visit Number  7    Number of Visits  12    Date for PT Re-Evaluation  03/19/19    Authorization Type  PROGRESS NOTE AT 10TH VISIT.  KX MODIFIER AFTER 15 VISITS.    PT Start Time  0945    PT Stop Time  1033    PT Time Calculation (min)  48 min    Activity Tolerance  Patient tolerated treatment well;Patient limited by pain    Behavior During Therapy  [facility] for tasks assessed/performed       Past Medical History:  Diagnosis Date  . Bursitis of hip   . Diabetes mellitus without complication ([place])    type II  . Hyperlipidemia   . Hypertension   . Hypothyroidism   . Low back pain    benign turmor back  . Stroke ([place])   . Vitamin D deficiency disease     Past Surgical History:  Procedure Laterality Date  . APPENDECTOMY    . BACK SURGERY  1996   turmor on back   . CHOLECYSTECTOMY    . LEG SURGERY Right    "clot and had drain in leg  rt lower leg    There were no vitals filed for this visit.  Subjective Assessment - 01/08/19 0950    Subjective  COVID-19 screen performed prior to patient entering clinic.    Pertinent History  DM, COPD, stroke, hypothyroidism, right leg and back surgery.    How long can you stand comfortably?  15 minutes.    How long can you walk comfortably?  Short distances in community.    Patient Stated Goals  Get out of pain.    Currently in Pain?  No/denies         [facility] PT Assessment - 01/08/19 0001      Assessment   Medical Diagnosis  Spinal stenosis of lumbosacral region.    Referring Provider (PT)  Vonna Kotyk Dettinger MD      Precautions   Precautions  Fall      Restrictions   Weight Bearing Restrictions  No                   [facility] Adult PT Treatment/Exercise - 01/08/19 0001      Lumbar Exercises: Aerobic   Nustep  L5 x10 min      Lumbar Exercises: Seated   Long Arc Quad on Chair  Strengthening;Both;3 sets;10 reps;Weights    LAQ on Chair Weights (lbs)  3    Hip Flexion on Ball  AROM;Both;20 reps    Other Seated Lumbar Exercises  Ab set x10 reps 5 sec hold, Row red theraband x20 reps    Other Seated Lumbar Exercises  Seated reachouts with silver ball x15 reps   limited by R shoulder pain     Knee/Hip Exercises: Seated   Clamshell with TheraBand  Red   x20 reps     Modalities   Modalities  Electrical Stimulation;Moist Heat      Moist Heat Therapy   Number Minutes Moist Heat  15 Minutes    Moist Heat Location  Lumbar Spine      Electrical Stimulation  Electrical Stimulation Location  B low back    Electrical Stimulation Action  Pre-Mod    Electrical Stimulation Parameters  80-150 hz x15 min    Electrical Stimulation Goals  Pain               PT Short Term Goals - 12/19/18 1231      PT SHORT TERM GOAL #1   Title  STG's=LTG's.        PT Long Term Goals - 01/02/19 1010      PT LONG TERM GOAL #1   Title  Independent with a HEP.    Time  6    Period  Weeks    Status  On-going      PT LONG TERM GOAL #2   Title  Stand 20 minutes with pain not > 3-4/10.    Time  6    Period  Weeks    Status  On-going      PT LONG TERM GOAL #3   Title  Perform ADL's with pain not > 3-4/10.    Time  6    Period  Weeks    Status  On-going            Plan - 01/08/19 1038    Clinical Impression Statement  Patient presented in clinic with no complaints today other than feeling sick from sinus issues. Patient completed seated therex fairly well although limited by R shoulder pain. Core activation VCs provided throughout treatment. Normal modalities response noted following removal of the modalities.    Personal Factors and  Comorbidities  Comorbidity 1;Comorbidity 2    Comorbidities  DM, COPD, stroke, hypothyroidism, right leg and back surgery.    Examination-Activity Limitations  Other;Locomotion Level;Stand    Examination-Participation Restrictions  Other    Stability/Clinical Decision Making  Evolving/Moderate complexity    Rehab Potential  Good    PT Frequency  2x / week    PT Duration  6 weeks    PT Treatment/Interventions  ADLs/Self Care Home Management;Cryotherapy;Electrical Stimulation;Ultrasound;Moist Heat;Therapeutic activities;Therapeutic exercise;Manual techniques;Patient/family education;Passive range of motion    PT Next Visit Plan  cont with Low-level core exercises.  Modalities and STW/M to L-S region.consider standing core exercises and posture strengthening    Consulted and Agree with Plan of Care  Patient       Patient will benefit from skilled therapeutic intervention in order to improve the following deficits and impairments:  Abnormal gait, Decreased activity tolerance, Decreased range of motion, Decreased strength, Pain  Visit Diagnosis: Abnormal posture  Chronic bilateral low back pain without sciatica     Problem List Patient Active Problem List   Diagnosis Date Noted  . Obesity (BMI 30.0-34.9) 03/12/2015  . Hypothyroidism 07/29/2014  . Osteoarthritis of right shoulder 01/21/2014  . Leg length discrepancy 11/04/2013  . Lumbar post-laminectomy syndrome 11/04/2013  . Diabetic peripheral neuropathy associated with type 2 diabetes mellitus (HCC) 11/04/2013  . COPD (chronic obstructive pulmonary disease) (HCC) 10/01/2012  . Chronic back pain 10/01/2012  . Hypertension associated with diabetes (HCC) 10/01/2012  . DM (diabetes mellitus) (HCC) 10/01/2012  . Vitamin D deficiency 10/01/2012  . Hyperlipidemia associated with type 2 diabetes mellitus (HCC) 10/01/2012  . Tinea unguium 10/01/2012    Marvell Fuller, PTA 01/08/2019, 10:45 AM  [facility] [address] [place], [place], [address] Phone: [phone]   Fax:  [phone]  Name: Laura Dalton MRN: [id number] Date of Birth: [birth year]

## 2019-01-14 ENCOUNTER — Ambulatory Visit: Payer: PPO | Admitting: Physical Therapy

## 2019-01-16 ENCOUNTER — Ambulatory Visit: Payer: PPO | Attending: Family Medicine | Admitting: Physical Therapy

## 2019-01-16 ENCOUNTER — Encounter: Payer: Self-pay | Admitting: Physical Therapy

## 2019-01-16 ENCOUNTER — Other Ambulatory Visit: Payer: Self-pay

## 2019-01-16 DIAGNOSIS — R293 Abnormal posture: Secondary | ICD-10-CM

## 2019-01-16 DIAGNOSIS — M545 Low back pain, unspecified: Secondary | ICD-10-CM

## 2019-01-16 DIAGNOSIS — G8929 Other chronic pain: Secondary | ICD-10-CM | POA: Diagnosis not present

## 2019-01-16 NOTE — Therapy (Signed)
[facility] [address] [place], [place], [address] Phone: [phone]   Fax:  [phone]  Physical Therapy Treatment  Patient Details  Name: Laura Dalton MRN: [id number] Date of Birth: 11/22/1950 Referring Provider (PT): Arville CareJoshua Dettinger MD   Encounter Date: 01/16/2019  PT End of Session - 01/16/19 1104    Visit Number  8    Number of Visits  12    Date for PT Re-Evaluation  03/19/19    Authorization Type  PROGRESS NOTE AT 10TH VISIT.  KX MODIFIER AFTER 15 VISITS.    PT Start Time  1033    PT Stop Time  1122    PT Time Calculation (min)  49 min    Activity Tolerance  Patient tolerated treatment well;Patient limited by pain    Behavior During Therapy  [facility] tasks assessed/performed       Past Medical History:  Diagnosis Date  . Bursitis of hip   . Diabetes mellitus without complication (HCC)    type II  . Hyperlipidemia   . Hypertension   . Hypothyroidism   . Low back pain    benign turmor back  . Stroke (HCC)   . Vitamin D deficiency disease     Past Surgical History:  Procedure Laterality Date  . APPENDECTOMY    . BACK SURGERY  1996   turmor on back   . CHOLECYSTECTOMY    . LEG SURGERY Right    "clot and had drain in leg  rt lower leg    There were no vitals filed for this visit.  Subjective Assessment - 01/16/19 1054    Subjective  COVID-19 screen performed prior to patient entering clinic. Reports that she does good until she fatigues. Reports that she has to use the electric cart at places such as [employer].    Pertinent History  DM, COPD, stroke, hypothyroidism, right leg and back surgery.    How long can you stand comfortably?  15 minutes.    How long can you walk comfortably?  Short distances in community.    Patient Stated Goals  Get out of pain.    Currently in Pain?  No/denies         [facility] - 01/16/19 0001      Assessment   Medical Diagnosis  Spinal stenosis of lumbosacral  region.    Referring Provider (PT)  Ivin BootyJoshua Dettinger MD      Precautions   Precautions  Fall      Restrictions   Weight Bearing Restrictions  No                   [facility] Adult PT Treatment/Exercise - 01/16/19 0001      Lumbar Exercises: Aerobic   Nustep  L5 x18 min      Lumbar Exercises: Seated   Long Arc Quad on Chair  Strengthening;Both;3 sets;10 reps;Weights    LAQ on Chair Weights (lbs)  3    Hip Flexion on Ball  AROM;Both;20 reps    Other Seated Lumbar Exercises  Row red theraband x20 reps    Other Seated Lumbar Exercises  Core rotation with ball x10 reps      Knee/Hip Exercises: Seated   Clamshell with TheraBand  Red      Modalities   Modalities  Electrical Stimulation;Moist Heat      Moist Heat Therapy   Number Minutes Moist Heat  15 Minutes    Moist Heat Location  Lumbar Spine  Emergency planning/management officer  B low back    Electrical Stimulation Action  Pre-Mod    Electrical Stimulation Parameters  80-150 hz x15 min    Electrical Stimulation Goals  Pain               PT Short Term Goals - 12/19/18 1231      PT SHORT TERM GOAL #1   Title  STG's=LTG's.        PT Long Term Goals - 01/02/19 1010      PT LONG TERM GOAL #1   Title  Independent with a HEP.    Time  6    Period  Weeks    Status  On-going      PT LONG TERM GOAL #2   Title  Stand 20 minutes with pain not > 3-4/10.    Time  6    Period  Weeks    Status  On-going      PT LONG TERM GOAL #3   Title  Perform ADL's with pain not > 3-4/10.    Time  6    Period  Weeks    Status  On-going            Plan - 01/16/19 1158    Clinical Impression Statement  Patient presented in clinic with reports of leaning over as she fatigues with prolonged standing. Patient very SOB upon arrival but states she has been very busy. No complaints during any seated therex. Limited with UE exercises secondary to shoulder pain. Normal modalities response noted  following removal of the modalities.    Personal Factors and Comorbidities  Comorbidity 1;Comorbidity 2    Comorbidities  DM, COPD, stroke, hypothyroidism, right leg and back surgery.    Examination-Activity Limitations  Other;Locomotion Level;Stand    Examination-Participation Restrictions  Other    Stability/Clinical Decision Making  Evolving/Moderate complexity    Rehab Potential  Good    PT Frequency  2x / week    PT Duration  6 weeks    PT Treatment/Interventions  ADLs/Self Care Home Management;Cryotherapy;Electrical Stimulation;Ultrasound;Moist Heat;Therapeutic activities;Therapeutic exercise;Manual techniques;Patient/family education;Passive range of motion    PT Next Visit Plan  cont with Low-level core exercises.  Modalities and STW/M to L-S region.consider standing core exercises and posture strengthening    Consulted and Agree with Plan of Care  Patient       Patient will benefit from skilled therapeutic intervention in order to improve the following deficits and impairments:  Abnormal gait, Decreased activity tolerance, Decreased range of motion, Decreased strength, Pain  Visit Diagnosis: Abnormal posture  Chronic bilateral low back pain without sciatica     Problem List Patient Active Problem List   Diagnosis Date Noted  . Obesity (BMI 30.0-34.9) 03/12/2015  . Hypothyroidism 07/29/2014  . Osteoarthritis of right shoulder 01/21/2014  . Leg length discrepancy 11/04/2013  . Lumbar post-laminectomy syndrome 11/04/2013  . Diabetic peripheral neuropathy associated with type 2 diabetes mellitus (HCC) 11/04/2013  . COPD (chronic obstructive pulmonary disease) (HCC) 10/01/2012  . Chronic back pain 10/01/2012  . Hypertension associated with diabetes (HCC) 10/01/2012  . DM (diabetes mellitus) (HCC) 10/01/2012  . Vitamin D deficiency 10/01/2012  . Hyperlipidemia associated with type 2 diabetes mellitus (HCC) 10/01/2012  . Tinea unguium 10/01/2012    Laura Dalton,  PTA 01/16/2019, 12:08 PM  [facility] [address] [place], [place], [address] Phone: [phone]   Fax:  [phone]  Name: Nicle Connole MRN:  [id number] Date of Birth: August 23, 1950

## 2019-01-21 ENCOUNTER — Ambulatory Visit: Payer: PPO | Admitting: *Deleted

## 2019-01-21 ENCOUNTER — Encounter: Payer: Self-pay | Admitting: *Deleted

## 2019-01-21 ENCOUNTER — Other Ambulatory Visit: Payer: Self-pay

## 2019-01-21 DIAGNOSIS — R293 Abnormal posture: Secondary | ICD-10-CM | POA: Diagnosis not present

## 2019-01-21 DIAGNOSIS — G8929 Other chronic pain: Secondary | ICD-10-CM

## 2019-01-21 NOTE — Therapy (Signed)
[facility] [address] [place], [place], [address] Phone: [phone]   Fax:  [phone]  Physical Therapy Treatment  Patient Details  Name: Laura Dalton MRN: [id number] Date of Birth: Feb 02, 1951 Referring Provider (PT): Arville Care MD   Encounter Date: 01/21/2019  PT End of Session - 01/21/19 1042    Visit Number  9    Number of Visits  12    Date for PT Re-Evaluation  03/19/19    Authorization Type  PROGRESS NOTE AT 10TH VISIT.  KX MODIFIER AFTER 15 VISITS.    PT Start Time  1030    PT Stop Time  1120    PT Time Calculation (min)  50 min       Past Medical History:  Diagnosis Date  . Bursitis of hip   . Diabetes mellitus without complication (HCC)    type II  . Hyperlipidemia   . Hypertension   . Hypothyroidism   . Low back pain    benign turmor back  . Stroke (HCC)   . Vitamin D deficiency disease     Past Surgical History:  Procedure Laterality Date  . APPENDECTOMY    . BACK SURGERY  1996   turmor on back   . CHOLECYSTECTOMY    . LEG SURGERY Right    "clot and had drain in leg  rt lower leg    There were no vitals filed for this visit.                    [facility] Adult PT Treatment/Exercise - 01/21/19 0001      Lumbar Exercises: Aerobic   Nustep  L5 x12 min      Lumbar Exercises: Seated   Long Arc Quad on Chair  Strengthening;Both;3 sets;10 reps;Weights    LAQ on Chair Weights (lbs)  3    Hip Flexion on Ball  --    Other Seated Lumbar Exercises  Row red theraband x 30 reps    Other Seated Lumbar Exercises  --      Knee/Hip Exercises: Seated   Ball Squeeze  3x10 hold 5 secs with drawin    Clamshell with TheraBand  Red      Modalities   Modalities  Electrical Stimulation;Moist Heat      Moist Heat Therapy   Number Minutes Moist Heat  15 Minutes    Moist Heat Location  Lumbar Spine      Electrical Stimulation   Electrical Stimulation Location  B low back    Electrical  Stimulation Action   Seated premod    Electrical Stimulation Parameters  80-150hz  x    Electrical Stimulation Goals  Pain               PT Short Term Goals - 12/19/18 1231      PT SHORT TERM GOAL #1   Title  STG's=LTG's.        PT Long Term Goals - 01/02/19 1010      PT LONG TERM GOAL #1   Title  Independent with a HEP.    Time  6    Period  Weeks    Status  On-going      PT LONG TERM GOAL #2   Title  Stand 20 minutes with pain not > 3-4/10.    Time  6    Period  Weeks    Status  On-going      PT LONG TERM GOAL #3  Title  Perform ADL's with pain not > 3-4/10.    Time  6    Period  Weeks    Status  On-going            Plan - 01/21/19 1420    Clinical Impression Statement  Pt arrived today doing fair, but reports having problems still with her back when standing and reaching up trying to perform ADL's. She was able to perform sitting exs today with mainly complaints of hip soreness. Pt still with SOB at times during Rx and doesn't tolerate standing exs very well. Normal modality response today.    Personal Factors and Comorbidities  Comorbidity 1;Comorbidity 2    Comorbidities  DM, COPD, stroke, hypothyroidism, right leg and back surgery.    Examination-Participation Restrictions  Other    Stability/Clinical Decision Making  Evolving/Moderate complexity    PT Frequency  2x / week    PT Duration  6 weeks    PT Treatment/Interventions  ADLs/Self Care Home Management;Cryotherapy;Electrical Stimulation;Ultrasound;Moist Heat;Therapeutic activities;Therapeutic exercise;Manual techniques;Patient/family education;Passive range of motion    PT Next Visit Plan  cont with Low-level core exercises.  Modalities and STW/M to L-S region.consider standing core exercises and posture strengthening    Consulted and Agree with Plan of Care  Patient       Patient will benefit from skilled therapeutic intervention in order to improve the following deficits and  impairments:  Abnormal gait, Decreased activity tolerance, Decreased range of motion, Decreased strength, Pain  Visit Diagnosis: Chronic bilateral low back pain without sciatica  Abnormal posture     Problem List Patient Active Problem List   Diagnosis Date Noted  . Obesity (BMI 30.0-34.9) 03/12/2015  . Hypothyroidism 07/29/2014  . Osteoarthritis of right shoulder 01/21/2014  . Leg length discrepancy 11/04/2013  . Lumbar post-laminectomy syndrome 11/04/2013  . Diabetic peripheral neuropathy associated with type 2 diabetes mellitus ([place]) 11/04/2013  . COPD (chronic obstructive pulmonary disease) ([place]) 10/01/2012  . Chronic back pain 10/01/2012  . Hypertension associated with diabetes ([place]) 10/01/2012  . DM (diabetes mellitus) ([place]) 10/01/2012  . Vitamin D deficiency 10/01/2012  . Hyperlipidemia associated with type 2 diabetes mellitus ([place]) 10/01/2012  . Tinea unguium 10/01/2012    Laura Dalton,CHRIS, PTA 01/21/2019, 2:34 PM  [facility] [address] [place], [place], [address] Phone: [phone]   Fax:  [phone]  Name: Laura Dalton MRN: [id number] Date of Birth: 29-Mar-1950

## 2019-01-22 ENCOUNTER — Other Ambulatory Visit: Payer: Self-pay | Admitting: Family Medicine

## 2019-01-22 DIAGNOSIS — G8929 Other chronic pain: Secondary | ICD-10-CM

## 2019-01-23 ENCOUNTER — Ambulatory Visit: Payer: PPO | Admitting: Physical Therapy

## 2019-01-28 ENCOUNTER — Other Ambulatory Visit: Payer: Self-pay

## 2019-01-28 ENCOUNTER — Telehealth: Payer: Self-pay | Admitting: Family Medicine

## 2019-01-28 ENCOUNTER — Ambulatory Visit: Payer: PPO | Admitting: *Deleted

## 2019-01-28 DIAGNOSIS — R293 Abnormal posture: Secondary | ICD-10-CM | POA: Diagnosis not present

## 2019-01-28 DIAGNOSIS — M545 Low back pain, unspecified: Secondary | ICD-10-CM

## 2019-01-28 DIAGNOSIS — G8929 Other chronic pain: Secondary | ICD-10-CM

## 2019-01-28 NOTE — Therapy (Signed)
[facility] [place], [place], [address] Phone: [phone]   Fax:  [phone]  Physical Therapy Treatment  Patient Details  Name: Laura Dalton MRN: [id number] Date of Birth: 04-Nov-1950 Referring Provider (PT): Caryl Pina MD   Encounter Date: 01/28/2019  PT End of Session - 01/28/19 1048    Visit Number  10    Number of Visits  12    Date for PT Re-Evaluation  03/19/19    Authorization Type  PROGRESS NOTE AT 10TH VISIT.  KX MODIFIER AFTER 15 VISITS.    PT Start Time  1030    PT Stop Time  1118    PT Time Calculation (min)  48 min       Past Medical History:  Diagnosis Date  . Bursitis of hip   . Diabetes mellitus without complication ([place])    type II  . Hyperlipidemia   . Hypertension   . Hypothyroidism   . Low back pain    benign turmor back  . Stroke ([place])   . Vitamin D deficiency disease     Past Surgical History:  Procedure Laterality Date  . APPENDECTOMY    . BACK SURGERY  1996   turmor on back   . CHOLECYSTECTOMY    . LEG SURGERY Right    "clot and had drain in leg  rt lower leg    There were no vitals filed for this visit.  Subjective Assessment - 01/28/19 1045    Subjective  COVID-19 screen performed prior to patient entering clinic. Reports that she does good until she fatigues. Tired today    Pertinent History  DM, COPD, stroke, hypothyroidism, right leg and back surgery.    How long can you stand comfortably?  15 minutes.    How long can you walk comfortably?  Short distances in community.    Currently in Pain?  No/denies                       [facility] Adult PT Treatment/Exercise - 01/28/19 0001      Lumbar Exercises: Aerobic   Nustep  L5 x18 min      Knee/Hip Exercises: Seated   Ball Squeeze  3x10 hold 5 secs with drawin    Clamshell with TheraBand  Yellow   x30     Modalities   Modalities  Electrical Stimulation;Moist Heat      Moist Heat Therapy   Number Minutes Moist Heat  20 Minutes    Moist Heat Location  Lumbar Spine      Electrical Stimulation   Electrical Stimulation Location  B low back    Electrical Stimulation Action  seated IFC x20 mins 80-150hz     Electrical Stimulation Goals  Pain               PT Short Term Goals - 12/19/18 1231      PT SHORT TERM GOAL #1   Title  STG's=LTG's.        PT Long Term Goals - 01/02/19 1010      PT LONG TERM GOAL #1   Title  Independent with a HEP.    Time  6    Period  Weeks    Status  On-going      PT LONG TERM GOAL #2   Title  Stand 20 minutes with pain not > 3-4/10.    Time  6    Period  Weeks    Status  On-going      PT LONG TERM GOAL #3   Title  Perform ADL's with pain not > 3-4/10.    Time  6    Period  Weeks    Status  On-going            Plan - 01/28/19 1111    Clinical Impression Statement  Pt arrived today doing fair, but reports having shooting pains in her upper back LT side. She feels that most of her pain is nerve pain and PT isn't really helping that. She feels the best right after Rx, but then pain returns. LTGs are ongoing due  to pain. Normal modality response today.    Personal Factors and Comorbidities  Comorbidity 1;Comorbidity 2    Comorbidities  DM, COPD, stroke, hypothyroidism, right leg and back surgery.    Examination-Activity Limitations  Other;Locomotion Level;Stand    Stability/Clinical Decision Making  Evolving/Moderate complexity    Rehab Potential  Good    PT Frequency  2x / week    PT Duration  6 weeks    PT Treatment/Interventions  ADLs/Self Care Home Management;Cryotherapy;Electrical Stimulation;Ultrasound;Moist Heat;Therapeutic activities;Therapeutic exercise;Manual techniques;Patient/family education;Passive range of motion    PT Next Visit Plan  cont with Low-level core exercises.  Modalities and STW/M to L-S region.consider standing core exercises and posture strengthening    Consulted and Agree with Plan of Care   Patient       Patient will benefit from skilled therapeutic intervention in order to improve the following deficits and impairments:  Abnormal gait, Decreased activity tolerance, Decreased range of motion, Decreased strength, Pain  Visit Diagnosis: Chronic bilateral low back pain without sciatica  Abnormal posture     Problem List Patient Active Problem List   Diagnosis Date Noted  . Obesity (BMI 30.0-34.9) 03/12/2015  . Hypothyroidism 07/29/2014  . Osteoarthritis of right shoulder 01/21/2014  . Leg length discrepancy 11/04/2013  . Lumbar post-laminectomy syndrome 11/04/2013  . Diabetic peripheral neuropathy associated with type 2 diabetes mellitus ([place]) 11/04/2013  . COPD (chronic obstructive pulmonary disease) ([place]) 10/01/2012  . Chronic back pain 10/01/2012  . Hypertension associated with diabetes ([place]) 10/01/2012  . DM (diabetes mellitus) ([place]) 10/01/2012  . Vitamin D deficiency 10/01/2012  . Hyperlipidemia associated with type 2 diabetes mellitus ([place]) 10/01/2012  . Tinea unguium 10/01/2012    Gaylin Osoria,CHRIS, PTA 01/28/2019, 11:22 AM  [facility] [place], [place], [address] Phone: [phone]   Fax:  [phone]  Name: Haydon Kalmar MRN: [id number] Date of Birth: October 12, 1950 Progress Note Reporting Period 12/19/18 to 01/28/19  See note below for Objective Data and Assessment of Progress/Goals. Temporary pain relief with no goals met thus far.    Mali Applegate MPT

## 2019-01-28 NOTE — Chronic Care Management (AMB) (Signed)
°  Chronic Care Management   Outreach Note  01/28/2019 Name: Laura Dalton MRN: [id number] DOB: 04-02-1950  Referred by: Dettinger, Fransisca Kaufmann, MD Reason for referral : Chronic Care Management (Initial CCM outreach was )   An unsuccessful telephone outreach was attempted today. The patient was referred to the case management team by for assistance with care management and care coordination.   Follow Up Plan: A HIPPA compliant phone message was left for the patient providing contact information and requesting a return call.  The care management team will reach out to the patient again over the next 7 days.  If patient returns call to provider office, please advise to call [place] at [place], [facility] [employer]  [place], [place] [address] Direct Dial: [email or web address]  Website: [email or web address]

## 2019-01-29 NOTE — Chronic Care Management (AMB) (Signed)
Chronic Care Management   Note  01/29/2019 Name: Samyra Limb MRN: [id number] DOB: Jan 29, 1951  Ky Moskowitz is a 68 y.o. year old female who is a primary care patient of Dettinger, Fransisca Kaufmann, MD. I reached out to [facility] by phone today in response to a referral sent by Ms. Sharin Mons Clontz's health plan.     Ms. Begay was given information about Chronic Care Management services today including:  1. CCM service includes personalized support from designated clinical staff supervised by her physician, including individualized plan of care and coordination with other care providers 2. 24/7 contact phone numbers for assistance for urgent and routine care needs. 3. Service will only be billed when office clinical staff spend 20 minutes or more in a month to coordinate care. 4. Only one practitioner may furnish and bill the service in a calendar month. 5. The patient may stop CCM services at any time (effective at the end of the month) by phone call to the office staff. 6. The patient will be responsible for cost sharing (co-pay) of up to 20% of the service fee (after annual deductible is met).  Patient agreed to services and verbal consent obtained.   Follow up plan: Telephone appointment with CCM team member scheduled for: 02/13/2019  [place], [facility]  [place], [place] [address] Direct Dial: [email or web address]  Website: [email or web address]

## 2019-01-30 ENCOUNTER — Encounter: Payer: Self-pay | Admitting: *Deleted

## 2019-01-30 ENCOUNTER — Other Ambulatory Visit: Payer: Self-pay

## 2019-01-30 ENCOUNTER — Ambulatory Visit: Payer: PPO | Admitting: *Deleted

## 2019-01-30 DIAGNOSIS — M545 Low back pain, unspecified: Secondary | ICD-10-CM

## 2019-01-30 DIAGNOSIS — G8929 Other chronic pain: Secondary | ICD-10-CM

## 2019-01-30 DIAGNOSIS — R293 Abnormal posture: Secondary | ICD-10-CM | POA: Diagnosis not present

## 2019-01-30 NOTE — Therapy (Signed)
[facility] [place], [place], [address] Phone: [phone]   Fax:  [phone]  Physical Therapy Treatment  Patient Details  Name: Laura Dalton MRN: [id number] Date of Birth: March 08, 1951 Referring Provider (PT): Caryl Pina MD   Encounter Date: 01/30/2019  PT End of Session - 01/30/19 1057    Visit Number  12    Number of Visits  12    Date for PT Re-Evaluation  03/19/19    Authorization Type  PROGRESS NOTE AT 10TH VISIT.  KX MODIFIER AFTER 15 VISITS.    PT Start Time  1030    PT Stop Time  1120    PT Time Calculation (min)  50 min       Past Medical History:  Diagnosis Date  . Bursitis of hip   . Diabetes mellitus without complication ([place])    type II  . Hyperlipidemia   . Hypertension   . Hypothyroidism   . Low back pain    benign turmor back  . Stroke ([place])   . Vitamin D deficiency disease     Past Surgical History:  Procedure Laterality Date  . APPENDECTOMY    . BACK SURGERY  1996   turmor on back   . CHOLECYSTECTOMY    . LEG SURGERY Right    "clot and had drain in leg  rt lower leg    There were no vitals filed for this visit.  Subjective Assessment - 01/30/19 1038    Subjective  COVID-19 screen performed prior to patient entering clinic. Reports that she does good until she fatigues. 6/10 when active    Pertinent History  DM, COPD, stroke, hypothyroidism, right leg and back surgery.    How long can you stand comfortably?  15 minutes.    How long can you walk comfortably?  Short distances in community.    Currently in Pain?  Yes    Pain Score  6    when standing   Pain Location  Back    Pain Orientation  Right;Left    Pain Descriptors / Indicators  Discomfort    Pain Type  Acute pain    Pain Onset  More than a month ago                       [facility] Adult PT Treatment/Exercise - 01/30/19 0001      Lumbar Exercises: Seated   Long Arc Quad on Chair   Strengthening;Both;3 sets;10 reps;Weights    LAQ on Chair Weights (lbs)  3    Other Seated Lumbar Exercises  Row red theraband x 30 reps       Knee/Hip Exercises: Seated   Ball Squeeze  3x10 hold 5 secs with drawin    Clamshell with TheraBand  Red   3x10   Sit to Sand  5 reps   discussed for HEP     Modalities   Modalities  Electrical Stimulation;Moist Heat      Moist Heat Therapy   Number Minutes Moist Heat  20 Minutes    Moist Heat Location  Lumbar Spine      Electrical Stimulation   Electrical Stimulation Location  B low back    Electrical Stimulation Action  seated 80-150hz  x 20 mins    Electrical Stimulation Goals  Pain               PT Short Term Goals - 12/19/18 1231      PT SHORT  TERM GOAL #1   Title  STG's=LTG's.        PT Long Term Goals - 01/30/19 1042      PT LONG TERM GOAL #1   Title  Independent with a HEP.    Period  Weeks    Status  Achieved      PT LONG TERM GOAL #2   Title  Stand 20 minutes with pain not > 3-4/10.    Time  6    Period  Weeks    Status  Not Met   NM 6/10 with standing activity     PT LONG TERM GOAL #3   Title  Perform ADL's with pain not > 3-4/10.    Time  6    Period  Weeks    Status  Not Met   6/10 with activity           Plan - 01/30/19 1058    Clinical Impression Statement  Pt arrived today doing fairly well with LBP mainly with standing activity . She was able to meet LTG #1, but not others due to pain 6/10 with ADL's and other act.'s. She reports that her back feels the best right after Rxs, but the pain returns with activity. She did well with Rx today and will be DC to HEP for LE and core strengthening    Comorbidities  DM, COPD, stroke, hypothyroidism, right leg and back surgery.    Examination-Activity Limitations  Other;Locomotion Level;Stand    Examination-Participation Restrictions  Other    Stability/Clinical Decision Making  Evolving/Moderate complexity    Rehab Potential  Good    PT Frequency   2x / week    PT Duration  6 weeks    PT Treatment/Interventions  ADLs/Self Care Home Management;Cryotherapy;Electrical Stimulation;Ultrasound;Moist Heat;Therapeutic activities;Therapeutic exercise;Manual techniques;Patient/family education;Passive range of motion    PT Next Visit Plan  DC to HEP       Patient will benefit from skilled therapeutic intervention in order to improve the following deficits and impairments:  Abnormal gait, Decreased activity tolerance, Decreased range of motion, Decreased strength, Pain  Visit Diagnosis: Chronic bilateral low back pain without sciatica  Abnormal posture     Problem List Patient Active Problem List   Diagnosis Date Noted  . Obesity (BMI 30.0-34.9) 03/12/2015  . Hypothyroidism 07/29/2014  . Osteoarthritis of right shoulder 01/21/2014  . Leg length discrepancy 11/04/2013  . Lumbar post-laminectomy syndrome 11/04/2013  . Diabetic peripheral neuropathy associated with type 2 diabetes mellitus ([place]) 11/04/2013  . COPD (chronic obstructive pulmonary disease) ([place]) 10/01/2012  . Chronic back pain 10/01/2012  . Hypertension associated with diabetes ([place]) 10/01/2012  . DM (diabetes mellitus) ([place]) 10/01/2012  . Vitamin D deficiency 10/01/2012  . Hyperlipidemia associated with type 2 diabetes mellitus ([place]) 10/01/2012  . Tinea unguium 10/01/2012    Laura Dalton,Laura Dalton , Laura Dalton 01/30/2019, 6:22 PM  [facility] [place], [place], [address] Phone: [phone]   Fax:  [phone]  Name: Laura Dalton MRN: [id number] Date of Birth: 13-Oct-1950  PHYSICAL THERAPY DISCHARGE SUMMARY  Visits from Start of Care: 12.  Current functional level related to goals / functional outcomes: See above.   Remaining deficits: See goal section.   Education / Equipment: HEP. Plan: Patient agrees to discharge.  Patient goals were partially met. Patient is being discharged due to lack of progress.   ?????         Mali Applegate MPT

## 2019-01-31 DIAGNOSIS — H40012 Open angle with borderline findings, low risk, left eye: Secondary | ICD-10-CM | POA: Diagnosis not present

## 2019-01-31 DIAGNOSIS — H40011 Open angle with borderline findings, low risk, right eye: Secondary | ICD-10-CM | POA: Diagnosis not present

## 2019-01-31 DIAGNOSIS — H2513 Age-related nuclear cataract, bilateral: Secondary | ICD-10-CM | POA: Diagnosis not present

## 2019-01-31 DIAGNOSIS — H524 Presbyopia: Secondary | ICD-10-CM | POA: Diagnosis not present

## 2019-01-31 DIAGNOSIS — H52223 Regular astigmatism, bilateral: Secondary | ICD-10-CM | POA: Diagnosis not present

## 2019-01-31 DIAGNOSIS — H25813 Combined forms of age-related cataract, bilateral: Secondary | ICD-10-CM | POA: Diagnosis not present

## 2019-01-31 DIAGNOSIS — E119 Type 2 diabetes mellitus without complications: Secondary | ICD-10-CM | POA: Diagnosis not present

## 2019-01-31 DIAGNOSIS — Z7984 Long term (current) use of oral hypoglycemic drugs: Secondary | ICD-10-CM | POA: Diagnosis not present

## 2019-01-31 DIAGNOSIS — H25013 Cortical age-related cataract, bilateral: Secondary | ICD-10-CM | POA: Diagnosis not present

## 2019-01-31 DIAGNOSIS — H5213 Myopia, bilateral: Secondary | ICD-10-CM | POA: Diagnosis not present

## 2019-01-31 LAB — HM DIABETES EYE EXAM

## 2019-02-13 ENCOUNTER — Ambulatory Visit: Payer: PPO | Admitting: *Deleted

## 2019-02-13 DIAGNOSIS — E1142 Type 2 diabetes mellitus with diabetic polyneuropathy: Secondary | ICD-10-CM

## 2019-02-13 DIAGNOSIS — I10 Essential (primary) hypertension: Secondary | ICD-10-CM

## 2019-02-13 DIAGNOSIS — J449 Chronic obstructive pulmonary disease, unspecified: Secondary | ICD-10-CM

## 2019-02-13 DIAGNOSIS — E1159 Type 2 diabetes mellitus with other circulatory complications: Secondary | ICD-10-CM

## 2019-02-13 NOTE — Chronic Care Management (AMB) (Signed)
  Chronic Care Management   Outreach Note  02/13/2019 Name: Laura Dalton MRN: [id number] DOB: December 03, 1950  Referred by: Dettinger, Fransisca Kaufmann, MD Reason for referral : Chronic Care Management (RN Initial visit)   An unsuccessful telephone outreach was attempted today. The patient was referred to the case management team by for assistance with care management and care coordination.   Follow Up Plan: The care management team will reach out to the patient again over the next 30 days.    Chong Sicilian, BSN, RN-BC Embedded Chronic Care Manager [facility] / [place] Management Direct Dial: [phone]

## 2019-02-13 NOTE — Patient Instructions (Signed)
  An unsuccessful telephone outreach was attempted today. The patient was referred to the case management team by for assistance with care management and care coordination.   Follow Up Plan: The care management team will reach out to the patient again over the next 30 days.    Chong Sicilian, BSN, RN-BC Embedded Chronic Care Manager [facility] / [place] Management Direct Dial: [phone]

## 2019-04-03 ENCOUNTER — Other Ambulatory Visit: Payer: Self-pay

## 2019-04-03 DIAGNOSIS — Z1231 Encounter for screening mammogram for malignant neoplasm of breast: Secondary | ICD-10-CM

## 2019-05-12 ENCOUNTER — Other Ambulatory Visit: Payer: Self-pay | Admitting: Family Medicine

## 2019-05-12 DIAGNOSIS — R600 Localized edema: Secondary | ICD-10-CM

## 2019-05-21 ENCOUNTER — Ambulatory Visit: Payer: PPO | Admitting: *Deleted

## 2019-05-22 DIAGNOSIS — Z1231 Encounter for screening mammogram for malignant neoplasm of breast: Secondary | ICD-10-CM | POA: Diagnosis not present

## 2019-05-22 NOTE — Chronic Care Management (AMB) (Signed)
  Chronic Care Management   Outreach Note  05/21/2019 Name: Laura Dalton MRN: [id number] DOB: [birth year]  Referred by: Dettinger, Elige Radon, MD Reason for referral : Chronic Care Management (RN Initial Visit (2nd attempt))   A second unsuccessful telephone outreach was attempted today. The patient was referred to the case management team for assistance with care management and care coordination.   Follow Up Plan: A HIPPA compliant phone message was left for the patient providing contact information and requesting a return call.  The care management team will reach out to the patient again over the next 45 days.   Demetrios Loll, BSN, RN-BC Embedded Chronic Care Manager [facility] / [facility] Care Management Direct Dial: [phone]   g

## 2019-06-02 ENCOUNTER — Ambulatory Visit (INDEPENDENT_AMBULATORY_CARE_PROVIDER_SITE_OTHER): Payer: PPO | Admitting: Family Medicine

## 2019-06-02 DIAGNOSIS — L600 Ingrowing nail: Secondary | ICD-10-CM | POA: Diagnosis not present

## 2019-06-02 NOTE — Progress Notes (Signed)
No answer, called and left a message, called twice Arville Care, MD [facility] 06/02/2019, 4:16 PM

## 2019-06-07 ENCOUNTER — Other Ambulatory Visit: Payer: Self-pay | Admitting: Family Medicine

## 2019-06-07 DIAGNOSIS — R600 Localized edema: Secondary | ICD-10-CM

## 2019-06-09 MED ORDER — HYDROCHLOROTHIAZIDE 25 MG PO TABS: 25.0000 mg | ORAL_TABLET | Freq: Every day | ORAL | 0 refills | Status: DC

## 2019-06-10 ENCOUNTER — Ambulatory Visit: Payer: PPO | Admitting: *Deleted

## 2019-06-10 NOTE — Chronic Care Management (AMB) (Signed)
  Chronic Care Management   Outreach Note  06/10/2019 Name: Laura Dalton MRN: [id number] DOB: [birth year]  Referred by: Dettinger, Elige Radon, MD Reason for referral : Chronic Care Management (Initial Visit)   A 3rd unsuccessful Initial Telephone Visit was attempted today. The patient was referred to the case management team for assistance with care management and care coordination.   Follow Up Plan: A HIPPA compliant phone message was left for the patient providing contact information and requesting a return call.  Telephone follow up appointment with care management team member scheduled for:05/30/2019 at 10:00. Date and time left on voicemail.   Demetrios Loll, BSN, RN-BC Embedded Chronic Care Manager [facility] / [facility] Care Management Direct Dial: [phone]

## 2019-06-19 ENCOUNTER — Telehealth: Payer: Self-pay | Admitting: Cardiology

## 2019-06-19 NOTE — Telephone Encounter (Signed)
Pt c/o loss of balance and fell 5 times over the last few months - denies dizziness/fainting/SOB/chest pain/swelling - no recent med changes - doesn't have a way to check HR/BP - pt aware to get office appt with pcp first since she has not been seen in office since pandemic and would forward to Dr Wyline Mood for any further recs

## 2019-06-19 NOTE — Telephone Encounter (Signed)
Patient called stating that lisinopril (ZESTRIL) 20 MG tablet seems to be causing her to fall down.  [phone]

## 2019-06-20 NOTE — Telephone Encounter (Signed)
Yes, needs pcp evaluation, only after pcp eval and if they think its anything cardiac would we evaluate her   Dominga Ferry MD

## 2019-06-27 ENCOUNTER — Ambulatory Visit (INDEPENDENT_AMBULATORY_CARE_PROVIDER_SITE_OTHER): Payer: PPO | Admitting: Family Medicine

## 2019-06-27 ENCOUNTER — Encounter: Payer: Self-pay | Admitting: Family Medicine

## 2019-06-27 ENCOUNTER — Other Ambulatory Visit: Payer: Self-pay

## 2019-06-27 VITALS — BP 125/62 | HR 83 | Temp 98.0°F | Ht 67.0 in | Wt 198.0 lb

## 2019-06-27 DIAGNOSIS — E785 Hyperlipidemia, unspecified: Secondary | ICD-10-CM | POA: Diagnosis not present

## 2019-06-27 DIAGNOSIS — E1169 Type 2 diabetes mellitus with other specified complication: Secondary | ICD-10-CM | POA: Diagnosis not present

## 2019-06-27 DIAGNOSIS — E1142 Type 2 diabetes mellitus with diabetic polyneuropathy: Secondary | ICD-10-CM

## 2019-06-27 DIAGNOSIS — R5381 Other malaise: Secondary | ICD-10-CM

## 2019-06-27 DIAGNOSIS — E1159 Type 2 diabetes mellitus with other circulatory complications: Secondary | ICD-10-CM

## 2019-06-27 DIAGNOSIS — I10 Essential (primary) hypertension: Secondary | ICD-10-CM | POA: Diagnosis not present

## 2019-06-27 DIAGNOSIS — E039 Hypothyroidism, unspecified: Secondary | ICD-10-CM

## 2019-06-27 DIAGNOSIS — R296 Repeated falls: Secondary | ICD-10-CM | POA: Diagnosis not present

## 2019-06-27 DIAGNOSIS — Z789 Other specified health status: Secondary | ICD-10-CM | POA: Diagnosis not present

## 2019-06-27 LAB — BAYER DCA HB A1C WAIVED: HB A1C (BAYER DCA - WAIVED): 6.6 % (ref <7.0)

## 2019-06-27 MED ORDER — BACLOFEN 10 MG PO TABS: 10.0000 mg | ORAL_TABLET | Freq: Three times a day (TID) | ORAL | 2 refills | Status: DC | PRN

## 2019-06-27 NOTE — Progress Notes (Signed)
BP 125/62   Pulse 83   Temp 98 F (36.7 C) (Temporal)   Ht 5' 7"  (1.702 m)   Wt 198 lb (89.8 kg)   SpO2 94%   BMI 31.01 kg/m    Subjective:   Patient ID: Laura Dalton, female    DOB: [birth year], 69 y.o.   MRN: [id number]  HPI: Laura Dalton is a 69 y.o. female presenting on 06/27/2019 for Medical Management of Chronic Issues and Fall (pt also c/o weakness and falling a lot recently- 11x in the last 4 months)   HPI Type 2 diabetes mellitus Patient comes in today for recheck of his diabetes. Patient has been currently taking Metformin and A1c is 6.6. Patient is currently on an ACE inhibitor/ARB. Patient has not seen an ophthalmologist this year. Patient denies any new issues with their feet.  Patient has neuropathy that is known but not changed.  Patient has been having recurrent falls and weakness and instability, some of it from lightheadedness but some of it just from being shaky and weak and not feeling she like she has strength.  This is been increasing over the past year.  She walks with a cane here to the office but it is very unstable with a cane.  Hypertension Patient is currently on lisinopril and hydrochlorothiazide, and their blood pressure today is 125/62.  Patient is having some lightheadedness and falls. Patient denies headaches, blurred vision, chest pains, shortness of breath, or weakness. Denies any side effects from medication and is content with current medication.   Hyperlipidemia Patient is coming in for recheck of his hyperlipidemia. The patient is currently taking atorvastatin. They deny any issues with myalgias or history of liver damage from it. They deny any focal numbness or weakness or chest pain.   Hypothyroidism recheck Patient is coming in for thyroid recheck today as well. They deny any issues with hair changes or heat or cold problems or diarrhea or constipation. They deny any chest pain or palpitations. They are currently on levothyroxine 88  micrograms   Relevant past medical, surgical, family and social history reviewed and updated as indicated. Interim medical history since our last visit reviewed. Allergies and medications reviewed and updated.  Review of Systems  Constitutional: Negative for chills and fever.  Eyes: Negative for visual disturbance.  Respiratory: Negative for chest tightness and shortness of breath.   Cardiovascular: Negative for chest pain and leg swelling.  Musculoskeletal: Positive for gait problem. Negative for back pain.  Skin: Negative for rash.  Neurological: Positive for weakness (4-5 strength in all 4 extremities). Negative for light-headedness and headaches.  Psychiatric/Behavioral: Negative for agitation and behavioral problems.  All other systems reviewed and are negative.   Per HPI unless specifically indicated above   Allergies as of 06/27/2019      Reactions   Aspirin Hives      Medication List       Accurate as of June 27, 2019 10:55 AM. If you have any questions, ask your nurse or doctor.        STOP taking these medications   hydrochlorothiazide 25 MG tablet Commonly known as: HYDRODIURIL Stopped by: Fransisca Kaufmann Yordin Rhoda, MD   TRAMADOL HCL PO Stopped by: Fransisca Kaufmann Shoni Quijas, MD     TAKE these medications   amitriptyline 100 MG tablet Commonly known as: ELAVIL Take 1 tablet (100 mg total) by mouth at bedtime.   atorvastatin 40 MG tablet Commonly known as: LIPITOR Take 1 tablet (40 mg total)  by mouth daily.   baclofen 10 MG tablet Commonly known as: LIORESAL Take 1 tablet (10 mg total) by mouth 3 (three) times daily as needed for muscle spasms.   calcium carbonate 1500 (600 Ca) MG Tabs tablet Commonly known as: OSCAL Take by mouth 2 (two) times daily with a meal.   clopidogrel 75 MG tablet Commonly known as: PLAVIX Take 1 tablet (75 mg total) by mouth daily.   gabapentin 600 MG tablet Commonly known as: NEURONTIN Take 1 tablet (600 mg total) by mouth 4  (four) times daily.   levothyroxine 88 MCG tablet Commonly known as: SYNTHROID Take 1 tablet (88 mcg total) by mouth daily before breakfast.   lidocaine 5 % ointment Commonly known as: XYLOCAINE Apply 1 application topically 3 (three) times daily as needed.   lisinopril 20 MG tablet Commonly known as: ZESTRIL Take 1 tablet (20 mg total) by mouth daily.   meloxicam 15 MG tablet Commonly known as: MOBIC Take 1 tablet by mouth once daily   metFORMIN 500 MG tablet Commonly known as: GLUCOPHAGE Take 1 tablet (500 mg total) by mouth daily.   triamcinolone cream 0.1 % Commonly known as: KENALOG Apply 1 application topically 2 (two) times daily.   vitamin C 500 MG tablet Commonly known as: ASCORBIC ACID Take 500 mg by mouth daily.        Objective:   BP 125/62   Pulse 83   Temp 98 F (36.7 C) (Temporal)   Ht 5' 7"  (1.702 m)   Wt 198 lb (89.8 kg)   SpO2 94%   BMI 31.01 kg/m   Wt Readings from Last 3 Encounters:  06/27/19 198 lb (89.8 kg)  12/12/18 193 lb 9.6 oz (87.8 kg)  09/04/18 193 lb (87.5 kg)    Physical Exam Vitals and nursing note reviewed.  Constitutional:      General: She is not in acute distress.    Appearance: She is well-developed. She is not diaphoretic.  Eyes:     Conjunctiva/sclera: Conjunctivae normal.  Cardiovascular:     Rate and Rhythm: Normal rate and regular rhythm.     Heart sounds: Normal heart sounds. No murmur.  Pulmonary:     Effort: Pulmonary effort is normal. No respiratory distress.     Breath sounds: Normal breath sounds. No wheezing.  Musculoskeletal:        General: Normal range of motion.  Skin:    General: Skin is warm and dry.     Findings: No rash.  Neurological:     Mental Status: She is alert and oriented to person, place, and time.     Coordination: Coordination normal.     Gait: Gait abnormal (Weak and very unstable gait even with a cane).  Psychiatric:        Behavior: Behavior normal.       Assessment &  Plan:   Problem List Items Addressed This Visit      Cardiovascular and Mediastinum   Hypertension associated with diabetes ([place])   Relevant Orders   CMP14+EGFR     Endocrine   DM (diabetes mellitus) ([place]) - Primary   Relevant Orders   Bayer DCA Hb A1c Waived   CBC with Differential/Platelet   CMP14+EGFR   Hyperlipidemia associated with type 2 diabetes mellitus ([facility])   Relevant Orders   Lipid panel   Diabetic peripheral neuropathy associated with type 2 diabetes mellitus ([place])   Relevant Medications   baclofen (LIORESAL) 10 MG tablet   Other Relevant Orders  CMP14+EGFR   Hypothyroidism   Relevant Orders   CBC with Differential/Platelet   TSH    Other Visit Diagnoses    Physical deconditioning       Relevant Orders   Ambulatory referral to [place]   Recurrent falls       Relevant Orders   Ambulatory referral to [place]   Decreased activities of daily living (ADL)       Relevant Orders   Ambulatory referral to [place] current medication will do home health referral. Follow up plan: Return in about 3 months (around 09/26/2019), or if symptoms worsen or fail to improve, for Recheck diabetes and thyroid.  Counseling provided for all of the vaccine components Orders Placed This Encounter  Procedures  . Bayer DCA Hb A1c Waived  . CBC with Differential/Platelet  . CMP14+EGFR  . Lipid panel  . TSH  . Ambulatory referral to [place], MD [facility] 06/27/2019, 10:55 AM

## 2019-06-28 LAB — CBC WITH DIFFERENTIAL/PLATELET
Basophils Absolute: 0 10*3/uL (ref 0.0–0.2)
Basos: 0 %
EOS (ABSOLUTE): 0.4 10*3/uL (ref 0.0–0.4)
Eos: 5 %
Hematocrit: 36.2 % (ref 34.0–46.6)
Hemoglobin: 12 g/dL (ref 11.1–15.9)
Immature Grans (Abs): 0 10*3/uL (ref 0.0–0.1)
Immature Granulocytes: 1 %
Lymphocytes Absolute: 1 10*3/uL (ref 0.7–3.1)
Lymphs: 13 %
MCH: 27.9 pg (ref 26.6–33.0)
MCHC: 33.1 g/dL (ref 31.5–35.7)
MCV: 84 fL (ref 79–97)
Monocytes Absolute: 0.8 10*3/uL (ref 0.1–0.9)
Monocytes: 10 %
Neutrophils Absolute: 5.6 10*3/uL (ref 1.4–7.0)
Neutrophils: 71 %
Platelets: 259 10*3/uL (ref 150–450)
RBC: 4.3 x10E6/uL (ref 3.77–5.28)
RDW: 12.8 % (ref 11.7–15.4)
WBC: 7.8 10*3/uL (ref 3.4–10.8)

## 2019-06-28 LAB — CMP14+EGFR
ALT: 5 IU/L (ref 0–32)
AST: 15 IU/L (ref 0–40)
Albumin/Globulin Ratio: 1.9 (ref 1.2–2.2)
Albumin: 4.4 g/dL (ref 3.8–4.8)
Alkaline Phosphatase: 83 IU/L (ref 39–117)
BUN/Creatinine Ratio: 10 — ABNORMAL LOW (ref 12–28)
BUN: 8 mg/dL (ref 8–27)
Bilirubin Total: 0.3 mg/dL (ref 0.0–1.2)
CO2: 30 mmol/L — ABNORMAL HIGH (ref 20–29)
Calcium: 10.1 mg/dL (ref 8.7–10.3)
Chloride: 94 mmol/L — ABNORMAL LOW (ref 96–106)
Creatinine, Ser: 0.82 mg/dL (ref 0.57–1.00)
GFR calc Af Amer: 84 mL/min/{1.73_m2} (ref >59)
GFR calc non Af Amer: 73 mL/min/{1.73_m2} (ref >59)
Globulin, Total: 2.3 g/dL (ref 1.5–4.5)
Glucose: 112 mg/dL — ABNORMAL HIGH (ref 65–99)
Potassium: 3.8 mmol/L (ref 3.5–5.2)
Sodium: 137 mmol/L (ref 134–144)
Total Protein: 6.7 g/dL (ref 6.0–8.5)

## 2019-06-28 LAB — LIPID PANEL
Chol/HDL Ratio: 2.9 ratio (ref 0.0–4.4)
Cholesterol, Total: 123 mg/dL (ref 100–199)
HDL: 42 mg/dL (ref >39)
LDL Chol Calc (NIH): 60 mg/dL (ref 0–99)
Triglycerides: 114 mg/dL (ref 0–149)
VLDL Cholesterol Cal: 21 mg/dL (ref 5–40)

## 2019-06-28 LAB — TSH: TSH: 2.42 u[IU]/mL (ref 0.450–4.500)

## 2019-06-30 ENCOUNTER — Ambulatory Visit (INDEPENDENT_AMBULATORY_CARE_PROVIDER_SITE_OTHER): Payer: PPO | Admitting: *Deleted

## 2019-06-30 DIAGNOSIS — M2170 Unequal limb length (acquired), unspecified site: Secondary | ICD-10-CM

## 2019-06-30 DIAGNOSIS — E1142 Type 2 diabetes mellitus with diabetic polyneuropathy: Secondary | ICD-10-CM

## 2019-06-30 DIAGNOSIS — R296 Repeated falls: Secondary | ICD-10-CM

## 2019-06-30 DIAGNOSIS — R5381 Other malaise: Secondary | ICD-10-CM

## 2019-06-30 DIAGNOSIS — J449 Chronic obstructive pulmonary disease, unspecified: Secondary | ICD-10-CM

## 2019-06-30 DIAGNOSIS — E1169 Type 2 diabetes mellitus with other specified complication: Secondary | ICD-10-CM

## 2019-06-30 DIAGNOSIS — E785 Hyperlipidemia, unspecified: Secondary | ICD-10-CM

## 2019-06-30 NOTE — Chronic Care Management (AMB) (Signed)
Chronic Care Management   Initial Visit Note  06/30/2019 Name: Laura Dalton MRN: [id number] DOB: [birth year]  Referred by: Dettinger, Fransisca Kaufmann, MD Reason for referral : Chronic Care Management (Initial Visit)   Laura Dalton is a 69 y.o. year old female who is a primary care patient of Dettinger, Fransisca Kaufmann, MD. The CCM team was consulted for assistance with chronic disease management and care coordination needs related to HTN, COPD, DM, OA.  Review of patient status, including review of consultants reports, relevant laboratory and other test results, and collaboration with appropriate care team members and the patient's provider was performed as part of comprehensive patient evaluation and provision of chronic care management services.    Subjective: I spoke with Laura Dalton by telephone today regarding her management of her chronic medical conditions. She lives at home alone but interacts with her children, mostly by telephone, during the week. Her daughter-in-law lives down the street and provides a lot of assistance.   SDOH (Social Determinants of Health) assessments performed: Yes See Care Plan activities for detailed interventions related to SDOH  SDOH Interventions     Most Recent Value  SDOH Interventions  SDOH Interventions for the Following Domains  Physical Activity  Physical Activity Interventions  Other (Comments) [Discussed ways to increase activity level. Will mail chair exercise handout]       Objective: Outpatient Encounter Medications as of 06/30/2019  Medication Sig  . amitriptyline (ELAVIL) 100 MG tablet Take 1 tablet (100 mg total) by mouth at bedtime.  Marland Kitchen atorvastatin (LIPITOR) 40 MG tablet Take 1 tablet (40 mg total) by mouth daily.  . baclofen (LIORESAL) 10 MG tablet Take 1 tablet (10 mg total) by mouth 3 (three) times daily as needed for muscle spasms.  . calcium carbonate (OSCAL) 1500 (600 Ca) MG TABS tablet Take by mouth 2 (two) times daily with a meal.  .  clopidogrel (PLAVIX) 75 MG tablet Take 1 tablet (75 mg total) by mouth daily.  Marland Kitchen gabapentin (NEURONTIN) 600 MG tablet Take 1 tablet (600 mg total) by mouth 4 (four) times daily.  Marland Kitchen levothyroxine (SYNTHROID) 88 MCG tablet Take 1 tablet (88 mcg total) by mouth daily before breakfast.  . lidocaine (XYLOCAINE) 5 % ointment Apply 1 application topically 3 (three) times daily as needed.  Marland Kitchen lisinopril (ZESTRIL) 20 MG tablet Take 1 tablet (20 mg total) by mouth daily.  . meloxicam (MOBIC) 15 MG tablet Take 1 tablet by mouth once daily  . metFORMIN (GLUCOPHAGE) 500 MG tablet Take 1 tablet (500 mg total) by mouth daily.  Marland Kitchen triamcinolone cream (KENALOG) 0.1 % Apply 1 application topically 2 (two) times daily.  . vitamin C (ASCORBIC ACID) 500 MG tablet Take 500 mg by mouth daily.   No facility-administered encounter medications on file as of 06/30/2019.     Lab Results  Component Value Date   HGBA1C 6.6 06/27/2019   HGBA1C 6.1 12/12/2018   HGBA1C 6.4 08/01/2018   Lab Results  Component Value Date   MICROALBUR negative 04/30/2014   LDLCALC 60 06/27/2019   CREATININE 0.82 06/27/2019   BP Readings from Last 3 Encounters:  06/27/19 125/62  12/12/18 134/77  09/04/18 (!) 167/63   Wt Readings from Last 3 Encounters:  06/27/19 198 lb (89.8 kg)  12/12/18 193 lb 9.6 oz (87.8 kg)  09/04/18 193 lb (87.5 kg)     RN Care Plan   . Chronic Disease Management Needs       CARE PLAN ENTRY (see longtitudinal  plan of care for additional care plan information)  Current Barriers:  . Chronic Disease Management support, education, and care coordination needs related to HTN, COPD, DM, OA  Clinical Goal(s) related to HTN, COPD, DM, OA:  Over the next 30 days, patient will:  . Work with the care management team to address educational, disease management, and care coordination needs  . Call provider office for new or worsened signs and symptoms  . Call care management team with questions or  concerns . Verbalize basic understanding of patient centered plan of care established today  Interventions related to HTN, COPD, DM, OA:  . Evaluation of current treatment plans and patient's adherence to plan as established by provider . Assessed patient understanding of disease states . Assessed patient's education and care coordination needs . Provided disease specific education to patient  . Collaborated with appropriate clinical care team members regarding patient needs . Provided with RN CM contact information and encouraged to reach out as needed . Scheduled 1 month telephone f/u with RN CM  Patient Self Care Activities related to HTN, COPD, DM, OA:  . Patient is unable to independently self-manage chronic health conditions  Initial goal documentation     . Fall Prevention       CARE PLAN ENTRY (see longitudinal plan of care for additional care plan information)  Current Barriers:  Marland Kitchen Knowledge Deficits related to fall prevention . History of falls . Problems with balance  Nurse Case Manager Clinical Goal(s):  Marland Kitchen Over the next 30 days, patient will meet with RN Care Manager to address fall prevention  Interventions:  . Inter-disciplinary care team collaboration (see longitudinal plan of care) . Chart reviewed . Fall risk assessment performed . Discussed fall prevention strategies o Encouraged patient to move purposefully and change positions slowly o Encouraged use of walker at all times o Discussed incontinence as a fall risk and management of incontinence o Encourage self and pet awareness at all times o Encouraged patent to keep a clear pathway through her home o Recommended keeping a light on at night . Discussed medical alert system . Care Guide referral for medical alert system  Patient Self Care Activities:  . Performs ADL's independently . Performs IADL's independently  Initial goal documentation     . Increase Physical Activity Level       CARE PLAN  ENTRY (see longitudinal plan of care for additional care plan information)  Current Barriers:  Marland Kitchen Knowledge Deficits related to exercise . Chronic back pain  Nurse Case Manager Clinical Goal(s):  Marland Kitchen Over the next 30 days, patient will meet with RN Care Manager to address increasing physical activity level  Interventions:  . Inter-disciplinary care team collaboration (see longitudinal plan of care) . Chart reviewed . Discussed current activity level . 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 Recommended chair exercises. Will prepare and mail educational resources.  . Discussed ambulation and use of walker  Patient Self Care Activities:  . Performs ADL's independently . Performs IADL's independently   Initial goal documentation     . Meal Planning       CARE PLAN ENTRY (see longitudinal plan of care for additional care plan information)  Current Barriers:  Marland Kitchen Knowledge Deficits related to meal planning for 1  Nurse Case Manager Clinical Goal(s):  Marland Kitchen Over the next 30 days, patient will work with Medical illustrator to address needs related to meal planning  Interventions:  . Inter-disciplinary care team collaboration (  see longitudinal plan of care) . Chart reviewed . Discussed current eating habits o Doesn't plan meals. Mainly eats junk food and sandwiches o Likes peanut butter o No nutritional supplements o Daughter-in-law will bring a fixed plate for her a couple times a week o Eats alone o Prepared meals in the past for her now deceased husband and her grandson that no longer lives with her . Encouraged routine meals with lean protein, frutis/vegetables . Recommended nutritional supplement like Boost . Will prepare handout on meal planning and mail to patient . Suggested she talk with LCSW regarding psychosocial concerns  Patient Self Care Activities:  . Performs ADL's independently . Performs IADL's independently   Initial goal  documentation        Follow-up Plan:  The care management team will reach out to the patient again over the next 30 days.    Demetrios Loll, BSN, RN-BC Embedded Chronic Care Manager [facility] / [facility] Care Management Direct Dial: [phone]

## 2019-06-30 NOTE — Patient Instructions (Signed)
Visit Information  Goals Addressed            This Visit's Progress   . Chronic Disease Management Needs       CARE PLAN ENTRY (see longtitudinal plan of care for additional care plan information)  Current Barriers:  . Chronic Disease Management support, education, and care coordination needs related to HTN, COPD, DM, OA  Clinical Goal(s) related to HTN, COPD, DM, OA:  Over the next 30 days, patient will:  . Work with the care management team to address educational, disease management, and care coordination needs  . Call provider office for new or worsened signs and symptoms  . Call care management team with questions or concerns . Verbalize basic understanding of patient centered plan of care established today  Interventions related to HTN, COPD, DM, OA:  . Evaluation of current treatment plans and patient's adherence to plan as established by provider . Assessed patient understanding of disease states . Assessed patient's education and care coordination needs . Provided disease specific education to patient  . Collaborated with appropriate clinical care team members regarding patient needs . Provided with RN CM contact information and encouraged to reach out as needed . Scheduled 1 month telephone f/u with RN CM  Patient Self Care Activities related to HTN, COPD, DM, OA:  . Patient is unable to independently self-manage chronic health conditions  Initial goal documentation     . Fall Prevention       CARE PLAN ENTRY (see longitudinal plan of care for additional care plan information)  Current Barriers:  Marland Kitchen Knowledge Deficits related to fall prevention . History of falls . Problems with balance  Nurse Case Manager Clinical Goal(s):  Marland Kitchen Over the next 30 days, patient will meet with RN Care Manager to address fall prevention  Interventions:  . Inter-disciplinary care team collaboration (see longitudinal plan of care) . Chart reviewed . Fall risk assessment  performed . Discussed fall prevention strategies o Encouraged patient to move purposefully and change positions slowly o Encouraged use of walker at all times o Discussed incontinence as a fall risk and management of incontinence o Encourage self and pet awareness at all times o Encouraged patent to keep a clear pathway through her home o Recommended keeping a light on at night . Discussed medical alert system . Care Guide referral for medical alert system  Patient Self Care Activities:  . Performs ADL's independently . Performs IADL's independently  Initial goal documentation     . Increase Physical Activity Level       CARE PLAN ENTRY (see longitudinal plan of care for additional care plan information)  Current Barriers:  Marland Kitchen Knowledge Deficits related to exercise . Chronic back pain  Nurse Case Manager Clinical Goal(s):  Marland Kitchen Over the next 30 days, patient will meet with RN Care Manager to address increasing physical activity level  Interventions:  . Inter-disciplinary care team collaboration (see longitudinal plan of care) . Chart reviewed . Discussed current activity level . 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 Recommended chair exercises. Will prepare and mail educational resources.  . Discussed ambulation and use of walker  Patient Self Care Activities:  . Performs ADL's independently . Performs IADL's independently   Initial goal documentation     . Meal Planning       CARE PLAN ENTRY (see longitudinal plan of care for additional care plan information)  Current Barriers:  Marland Kitchen Knowledge Deficits related to meal planning for  1  Nurse Case Manager Clinical Goal(s):  Marland Kitchen Over the next 30 days, patient will work with Consulting civil engineer to address needs related to meal planning  Interventions:  . Inter-disciplinary care team collaboration (see longitudinal plan of care) . Chart reviewed . Discussed current eating habits o Doesn't plan  meals. Mainly eats junk food and sandwiches o Likes peanut butter o No nutritional supplements o Daughter-in-law will bring a fixed plate for her a couple times a week o Eats alone o Prepared meals in the past for her now deceased husband and her grandson that no longer lives with her . Encouraged routine meals with lean protein, frutis/vegetables . Recommended nutritional supplement like Boost . Will prepare handout on meal planning and mail to patient . Suggested she talk with LCSW regarding psychosocial concerns  Patient Self Care Activities:  . Performs ADL's independently . Performs IADL's independently   Initial goal documentation        Ms. Mier was given information about Chronic Care Management services today including:  1. CCM service includes personalized support from designated clinical staff supervised by her physician, including individualized plan of care and coordination with other care providers 2. 24/7 contact phone numbers for assistance for urgent and routine care needs. 3. Service will only be billed when office clinical staff spend 20 minutes or more in a month to coordinate care. 4. Only one practitioner may furnish and bill the service in a calendar month. 5. The patient may stop CCM services at any time (effective at the end of the month) by phone call to the office staff. 6. The patient will be responsible for cost sharing (co-pay) of up to 20% of the service fee (after annual deductible is met).  Patient agreed to services and verbal consent obtained.   The patient verbalized understanding of instructions provided today and declined a print copy of patient instruction materials.   The care management team will reach out to the patient again over the next 30 days.   Chong Sicilian, BSN, RN-BC Embedded Chronic Care Manager [facility] Direct Dial: [phone]

## 2019-07-03 ENCOUNTER — Ambulatory Visit: Payer: PPO | Admitting: *Deleted

## 2019-07-03 DIAGNOSIS — E1142 Type 2 diabetes mellitus with diabetic polyneuropathy: Secondary | ICD-10-CM

## 2019-07-03 DIAGNOSIS — E1169 Type 2 diabetes mellitus with other specified complication: Secondary | ICD-10-CM | POA: Diagnosis not present

## 2019-07-03 DIAGNOSIS — J449 Chronic obstructive pulmonary disease, unspecified: Secondary | ICD-10-CM

## 2019-07-03 DIAGNOSIS — I10 Essential (primary) hypertension: Secondary | ICD-10-CM | POA: Diagnosis not present

## 2019-07-03 DIAGNOSIS — E1159 Type 2 diabetes mellitus with other circulatory complications: Secondary | ICD-10-CM | POA: Diagnosis not present

## 2019-07-03 DIAGNOSIS — R5381 Other malaise: Secondary | ICD-10-CM

## 2019-07-03 DIAGNOSIS — R296 Repeated falls: Secondary | ICD-10-CM

## 2019-07-03 DIAGNOSIS — E785 Hyperlipidemia, unspecified: Secondary | ICD-10-CM | POA: Diagnosis not present

## 2019-07-03 NOTE — Patient Instructions (Signed)
Visit Information  Goals Addressed            This Visit's Progress   . Coordinate Home Health Services       CARE PLAN ENTRY (see longitudinal plan of care for additional care plan information)  Home health services need in a patient with HTN, COPD, DM, OA and recent physical deconditioning and increased falls.   Current Barriers:  . Local home health agencies staffing issues and insurance issues . Corporate treasurer.  . Transportation barriers  Nurse Case Manager Clinical Goal(s):  Marland Kitchen Over the next 7 days, patient will have home health services initiated.   Interventions:  . Inter-disciplinary care team collaboration (see longitudinal plan of care) . Chart reviewed including recent office notes and referral . Collaborated with Fawn Kirk, LPN who manages home health referrals for [facility] . Collaborated with Livia Snellen with [facility] regarding our inability to find services for this patient . Collaborated with LCSW . Reached out to [facility] [phone] to discuss their denial of patient referral . Asked Lynden Ang to fax referral to [facility] again at [phone] . Awaiting response from [facility] to see if they can recommend another agency to refer to  Patient Self Care Activities:  . Performs ADL's independently . Performs IADL's independently . Unable to independently drive  Initial goal documentation        Follow-up Plan The care management team will reach out to the patient again over the next 7 days.   Demetrios Loll, BSN, RN-BC Embedded Chronic Care Manager [facility] Care Management Direct Dial: [phone]

## 2019-07-03 NOTE — Chronic Care Management (AMB) (Signed)
  Chronic Care Management   Care Coordination Note   07/03/2019 Name: Laura Dalton MRN: [id number] DOB: 07/16/1950  Referred by: Dettinger, Elige Radon, MD Reason for referral : Chronic Care Management (Care Coordination)   Laura Dalton is a 69 y.o. year old female who is a primary care patient of Dettinger, Elige Radon, MD. The CCM team was consulted for assistance with chronic disease management and care coordination needs.    Review of patient status, including review of consultants reports, relevant laboratory and other test results, and collaboration with appropriate care team members and the patient's provider was performed as part of comprehensive patient evaluation and provision of chronic care management services.    SDOH (Social Determinants of Health) assessments performed: No See Care Plan activities for detailed interventions related to [facility])     RN Care Plan   . Coordinate Home Health Services       CARE PLAN ENTRY (see longitudinal plan of care for additional care plan information)  Home health services need in a patient with HTN, COPD, DM, OA and recent physical deconditioning and increased falls.   Current Barriers:  . Local home health agencies staffing issues and insurance issues . Corporate treasurer.  . Transportation barriers  Nurse Case Manager Clinical Goal(s):  Marland Kitchen Over the next 7 days, patient will have home health services initiated.   Interventions:  . Inter-disciplinary care team collaboration (see longitudinal plan of care) . Chart reviewed including recent office notes and referral . Collaborated with Fawn Kirk, LPN who manages home health referrals for [facility] . Collaborated with Livia Snellen with [facility] regarding our inability to find services for this patient . Collaborated with LCSW . Reached out to [facility] [phone] to discuss their denial of patient referral . Asked Lynden Ang to fax referral to [facility] again at  [phone] . Awaiting response from [facility] to see if they can recommend another agency to refer to  Patient Self Care Activities:  . Performs ADL's independently . Performs IADL's independently . Unable to independently drive  Initial goal documentation         Follow-up Plan:   The care management team will reach out to the patient again over the next 7 days.    Demetrios Loll, BSN, RN-BC Embedded Chronic Care Manager [facility] Care Management Direct Dial: [phone]

## 2019-07-09 ENCOUNTER — Ambulatory Visit: Payer: PPO | Admitting: *Deleted

## 2019-07-09 DIAGNOSIS — E1159 Type 2 diabetes mellitus with other circulatory complications: Secondary | ICD-10-CM

## 2019-07-09 DIAGNOSIS — I10 Essential (primary) hypertension: Secondary | ICD-10-CM

## 2019-07-09 DIAGNOSIS — E785 Hyperlipidemia, unspecified: Secondary | ICD-10-CM

## 2019-07-09 DIAGNOSIS — E1142 Type 2 diabetes mellitus with diabetic polyneuropathy: Secondary | ICD-10-CM

## 2019-07-09 DIAGNOSIS — J449 Chronic obstructive pulmonary disease, unspecified: Secondary | ICD-10-CM

## 2019-07-09 DIAGNOSIS — E1169 Type 2 diabetes mellitus with other specified complication: Secondary | ICD-10-CM

## 2019-07-09 NOTE — Chronic Care Management (AMB) (Signed)
  Chronic Care Management   Care Coordination Note   07/09/2019 Name: Laura Dalton MRN: [id number] DOB: 12-Nov-1950  Referred by: Dettinger, Elige Radon, MD Reason for referral : Chronic Care Management (Care Coordination)   Laura Dalton is a 69 y.o. year old female who is a primary care patient of Dettinger, Elige Radon, MD. The CCM team was consulted for assistance with chronic disease management and care coordination needs.    RN Care Plan   . Coordinate Home Health Services       CARE PLAN ENTRY (see longitudinal plan of care for additional care plan information)  Home health services need in a patient with HTN, COPD, DM, OA and recent physical deconditioning and increased falls.   Current Barriers:  . Local home health agencies staffing issues and insurance issues . Corporate treasurer.  . Transportation barriers  Nurse Case Manager Clinical Goal(s):  Marland Kitchen Over the next 7 days, patient will have home health services initiated.   Interventions:  . Inter-disciplinary care team collaboration (see longitudinal plan of care) . Chart reviewed including recent office notes and referral . Collaborated with Fawn Kirk, LPN who manages home health referrals for [facility] o She has sent a referral to all local home health agencies that are in network with [facility] and one that is not in network, and no one is able to take Ms [place] on at this time . Previously communicated with Livia Snellen, Assistant Clinical Director of Care Management with [facility] and Lynden Ang, Vice President of Utilization Management with [facility] regarding our inability to access home health care for this patient . RN will reach back out to [place] to see if they have any more recommendations  Patient Self Care Activities:  . Performs ADL's independently . Performs IADL's independently . Unable to independently drive  Please see past updates related to this goal by clicking on the "Past Updates" button in the  selected goal          Plan:   The care management team will reach out to the patient again over the next 7 days.   Demetrios Loll, BSN, RN-BC Embedded Chronic Care Manager [facility] / [facility] Care Management Direct Dial: [phone]

## 2019-07-09 NOTE — Patient Instructions (Signed)
Visit Information  Goals Addressed            This Visit's Progress   . Coordinate Home Health Services       CARE PLAN ENTRY (see longitudinal plan of care for additional care plan information)  Home health services need in a patient with HTN, COPD, DM, OA and recent physical deconditioning and increased falls.   Current Barriers:  . Local home health agencies staffing issues and insurance issues . Corporate treasurer.  . Transportation barriers  Nurse Case Manager Clinical Goal(s):  Marland Kitchen Over the next 7 days, patient will have home health services initiated.   Interventions:  . Inter-disciplinary care team collaboration (see longitudinal plan of care) . Chart reviewed including recent office notes and referral . Collaborated with Fawn Kirk, LPN who manages home health referrals for [facility] o She has sent a referral to all local home health agencies that are in network with [facility] and one that is not in network, and no one is able to take Ms [place] on at this time . Previously communicated with Livia Snellen, Assistant Clinical Director of Care Management with [facility] and Lynden Ang, Vice President of Utilization Management with [facility] regarding our inability to access home health care for this patient . RN will reach back out to [place] to see if they have any more recommendations  Patient Self Care Activities:  . Performs ADL's independently . Performs IADL's independently . Unable to independently drive  Please see past updates related to this goal by clicking on the "Past Updates" button in the selected goal         Follow-up Plan The care management team will reach out to the patient again over the next 7 days.   Demetrios Loll, BSN, RN-BC Embedded Chronic Care Manager [facility] / [facility] Care Management Direct Dial: [phone]

## 2019-07-11 DIAGNOSIS — E785 Hyperlipidemia, unspecified: Secondary | ICD-10-CM | POA: Diagnosis not present

## 2019-07-11 DIAGNOSIS — Z7902 Long term (current) use of antithrombotics/antiplatelets: Secondary | ICD-10-CM | POA: Diagnosis not present

## 2019-07-11 DIAGNOSIS — Z7984 Long term (current) use of oral hypoglycemic drugs: Secondary | ICD-10-CM | POA: Diagnosis not present

## 2019-07-11 DIAGNOSIS — R2681 Unsteadiness on feet: Secondary | ICD-10-CM | POA: Diagnosis not present

## 2019-07-11 DIAGNOSIS — E039 Hypothyroidism, unspecified: Secondary | ICD-10-CM | POA: Diagnosis not present

## 2019-07-11 DIAGNOSIS — M6281 Muscle weakness (generalized): Secondary | ICD-10-CM | POA: Diagnosis not present

## 2019-07-11 DIAGNOSIS — E1169 Type 2 diabetes mellitus with other specified complication: Secondary | ICD-10-CM | POA: Diagnosis not present

## 2019-07-11 DIAGNOSIS — I10 Essential (primary) hypertension: Secondary | ICD-10-CM | POA: Diagnosis not present

## 2019-07-11 DIAGNOSIS — E1142 Type 2 diabetes mellitus with diabetic polyneuropathy: Secondary | ICD-10-CM | POA: Diagnosis not present

## 2019-07-15 DIAGNOSIS — E1142 Type 2 diabetes mellitus with diabetic polyneuropathy: Secondary | ICD-10-CM | POA: Diagnosis not present

## 2019-07-15 DIAGNOSIS — Z7984 Long term (current) use of oral hypoglycemic drugs: Secondary | ICD-10-CM | POA: Diagnosis not present

## 2019-07-15 DIAGNOSIS — E785 Hyperlipidemia, unspecified: Secondary | ICD-10-CM | POA: Diagnosis not present

## 2019-07-15 DIAGNOSIS — Z7902 Long term (current) use of antithrombotics/antiplatelets: Secondary | ICD-10-CM | POA: Diagnosis not present

## 2019-07-15 DIAGNOSIS — E1169 Type 2 diabetes mellitus with other specified complication: Secondary | ICD-10-CM | POA: Diagnosis not present

## 2019-07-15 DIAGNOSIS — R2681 Unsteadiness on feet: Secondary | ICD-10-CM | POA: Diagnosis not present

## 2019-07-15 DIAGNOSIS — E039 Hypothyroidism, unspecified: Secondary | ICD-10-CM | POA: Diagnosis not present

## 2019-07-15 DIAGNOSIS — I10 Essential (primary) hypertension: Secondary | ICD-10-CM | POA: Diagnosis not present

## 2019-07-15 DIAGNOSIS — M6281 Muscle weakness (generalized): Secondary | ICD-10-CM | POA: Diagnosis not present

## 2019-07-17 ENCOUNTER — Other Ambulatory Visit: Payer: Self-pay

## 2019-07-17 ENCOUNTER — Ambulatory Visit (INDEPENDENT_AMBULATORY_CARE_PROVIDER_SITE_OTHER): Payer: PPO

## 2019-07-17 DIAGNOSIS — E1169 Type 2 diabetes mellitus with other specified complication: Secondary | ICD-10-CM

## 2019-07-17 DIAGNOSIS — Z7984 Long term (current) use of oral hypoglycemic drugs: Secondary | ICD-10-CM | POA: Diagnosis not present

## 2019-07-17 DIAGNOSIS — M6281 Muscle weakness (generalized): Secondary | ICD-10-CM | POA: Diagnosis not present

## 2019-07-17 DIAGNOSIS — E785 Hyperlipidemia, unspecified: Secondary | ICD-10-CM

## 2019-07-17 DIAGNOSIS — R2681 Unsteadiness on feet: Secondary | ICD-10-CM

## 2019-07-17 DIAGNOSIS — E039 Hypothyroidism, unspecified: Secondary | ICD-10-CM | POA: Diagnosis not present

## 2019-07-17 DIAGNOSIS — I10 Essential (primary) hypertension: Secondary | ICD-10-CM

## 2019-07-17 DIAGNOSIS — Z7902 Long term (current) use of antithrombotics/antiplatelets: Secondary | ICD-10-CM

## 2019-07-17 DIAGNOSIS — E1142 Type 2 diabetes mellitus with diabetic polyneuropathy: Secondary | ICD-10-CM | POA: Diagnosis not present

## 2019-07-23 ENCOUNTER — Telehealth: Payer: Self-pay | Admitting: *Deleted

## 2019-07-23 NOTE — Telephone Encounter (Signed)
VM from [facility] w/ [facility] FYI: pt had fall this morning Has scrape on forearm and left knee pain, no swelling, will continue to monitor

## 2019-07-25 ENCOUNTER — Telehealth: Payer: Self-pay | Admitting: *Deleted

## 2019-07-25 NOTE — Telephone Encounter (Signed)
VM from [facility] PT w/ [facility] Pt had another fall this morning with no injuries, pt is wondering if her lisinopril is giving her problems

## 2019-07-25 NOTE — Telephone Encounter (Signed)
Spoke with Cindee Lame from home health and he states that her BPs are running pretty good. Readings this week have been 152/78, 150/82, and 156/72. Advised that falls are probably coming from weakness and home health agrees. Advised to keep [place] posted on BPs and also any falls. Pete verbalized understanding.

## 2019-07-25 NOTE — Telephone Encounter (Signed)
What is her blood pressure running, if her blood pressures running down or she is feeling lightheaded or dizzy and that is the reason she is following with that could be the case, if her blood pressures not running down then she is likely falling because she is weak.

## 2019-07-29 ENCOUNTER — Ambulatory Visit (INDEPENDENT_AMBULATORY_CARE_PROVIDER_SITE_OTHER): Payer: PPO | Admitting: *Deleted

## 2019-07-29 DIAGNOSIS — E1142 Type 2 diabetes mellitus with diabetic polyneuropathy: Secondary | ICD-10-CM

## 2019-07-29 DIAGNOSIS — M19011 Primary osteoarthritis, right shoulder: Secondary | ICD-10-CM

## 2019-07-29 DIAGNOSIS — R296 Repeated falls: Secondary | ICD-10-CM

## 2019-07-29 NOTE — Chronic Care Management (AMB) (Signed)
Chronic Care Management   Follow Up Note   07/29/2019 Name: Laura Dalton MRN: [id number] DOB: January 16, 1951  Referred by: Dettinger, Fransisca Kaufmann, MD Reason for referral : Chronic Care Management (RN follow up)   Laura Dalton is a 69 y.o. year old female who is a primary care patient of Dettinger, Fransisca Kaufmann, MD. The CCM team was consulted for assistance with chronic disease management and care coordination needs.    Review of patient status, including review of consultants reports, relevant laboratory and other test results, and collaboration with appropriate care team members and the patient's provider was performed as part of comprehensive patient evaluation and provision of chronic care management services.    SDOH (Social Determinants of Health) assessments performed: No See Care Plan activities for detailed interventions related to [facility])     Outpatient Encounter Medications as of 07/29/2019  Medication Sig  . amitriptyline (ELAVIL) 100 MG tablet Take 1 tablet (100 mg total) by mouth at bedtime.  Marland Kitchen atorvastatin (LIPITOR) 40 MG tablet Take 1 tablet (40 mg total) by mouth daily.  . baclofen (LIORESAL) 10 MG tablet Take 1 tablet (10 mg total) by mouth 3 (three) times daily as needed for muscle spasms.  . calcium carbonate (OSCAL) 1500 (600 Ca) MG TABS tablet Take by mouth 2 (two) times daily with a meal.  . clopidogrel (PLAVIX) 75 MG tablet Take 1 tablet (75 mg total) by mouth daily.  Marland Kitchen gabapentin (NEURONTIN) 600 MG tablet Take 1 tablet (600 mg total) by mouth 4 (four) times daily.  Marland Kitchen levothyroxine (SYNTHROID) 88 MCG tablet Take 1 tablet (88 mcg total) by mouth daily before breakfast.  . lidocaine (XYLOCAINE) 5 % ointment Apply 1 application topically 3 (three) times daily as needed.  Marland Kitchen lisinopril (ZESTRIL) 20 MG tablet Take 1 tablet (20 mg total) by mouth daily.  . meloxicam (MOBIC) 15 MG tablet Take 1 tablet by mouth once daily  . metFORMIN (GLUCOPHAGE) 500 MG tablet Take 1 tablet  (500 mg total) by mouth daily.  Marland Kitchen triamcinolone cream (KENALOG) 0.1 % Apply 1 application topically 2 (two) times daily.  . vitamin C (ASCORBIC ACID) 500 MG tablet Take 500 mg by mouth daily.   No facility-administered encounter medications on file as of 07/29/2019.      RN Care Plan   . "I don't want to fall"       CARE PLAN ENTRY (see longitudinal plan of care for additional care plan information)  Current Barriers:  Marland Kitchen Knowledge Deficits related to fall prevention . History of falls . Problems with balance  Nurse Case Manager Clinical Goal(s):  Marland Kitchen Over the next 30 days, patient will meet with RN Care Manager to address fall prevention  Interventions:  . Inter-disciplinary care team collaboration (see longitudinal plan of care) . Chart reviewed . Assessed for falls since last visit o Uses walker but it doesn't fit in the bathroom o Had one episode where she woke up shaking all over and she fell going into the bathroom o No major injury o No episodes of shaking since then . Discussed fall prevention strategies o Encouraged patient to move purposefully and change positions slowly o Encouraged use of walker at all times o Discussed incontinence as a fall risk and management of incontinence o Encourage self and pet awareness at all times o Encouraged patent to keep a clear pathway through her home o Recommended keeping a light on at night . Encouraged patient to use walker at all times .  Discussed home health physical therapy strength and gait training. Currently receiving services twice a week.  . Discussed blood sugar and symptoms of hypoglycemia . Encouraged patient to check blood sugar daily and PRN . Previously referred patient to [facility] for medical alert system . Provided with CCM contact information and encouraged to reach out as needed  Patient Self Care Activities:  . Performs ADL's independently . Performs IADL's independently  Initial goal documentation       . "I want to be able to get around more easily"       CARE PLAN ENTRY (see longitudinal plan of care for additional care plan information)  Current Barriers:  Marland Kitchen Knowledge Deficits related to exercise . Chronic back pain  Nurse Case Manager Clinical Goal(s):  Marland Kitchen Over the next 30 days, patient will meet with RN Care Manager to address increasing physical activity level  Interventions:  . Inter-disciplinary care team collaboration (see longitudinal plan of care) . Chart reviewed . Discussed current activity level . Discussed home health physical therapy services . 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 Previously recommended chair exercises and mailed resources  . Discussed ambulation and use of walker  Patient Self Care Activities:  . Performs ADL's independently . Performs IADL's independently   Please see past updates related to this goal by clicking on the "Past Updates" button in the selected goal      . COMPLETED: Coordinate Home Health Services       CARE PLAN ENTRY (see longitudinal plan of care for additional care plan information)  Home health services need in a patient with HTN, COPD, DM, OA and recent physical deconditioning and increased falls.   Current Barriers:  . Local home health agencies staffing issues and insurance issues . Corporate treasurer.  . Transportation barriers  Nurse Case Manager Clinical Goal(s):  Marland Kitchen Over the next 7 days, patient will have home health services initiated.   Interventions:  . Inter-disciplinary care team collaboration (see longitudinal plan of care) . Chart reviewed including recent office notes and referral . Collaborated with Fawn Kirk, LPN who manages home health referrals for [facility] o She has sent a referral to all local home health agencies that are in network with [facility] and one that is not in network, and no one is able to take Ms [place] on at this time . Previously communicated with Livia Snellen, Assistant Clinical Director of Care Management with [facility] and Lynden Ang, Vice President of Utilization Management with [facility] regarding our inability to access home health care for this patient . RN will reach back out to [place] to see if they have any more recommendations  Patient Self Care Activities:  . Performs ADL's independently . Performs IADL's independently . Unable to independently drive  Please see past updates related to this goal by clicking on the "Past Updates" button in the selected goal   [facility] was able to take patient     . Meal Planning       CARE PLAN ENTRY (see longitudinal plan of care for additional care plan information)  Current Barriers:  Marland Kitchen Knowledge Deficits related to meal planning for 1  Nurse Case Manager Clinical Goal(s):  Marland Kitchen Over the next 30 days, patient will work with Medical illustrator to address needs related to meal planning  Interventions:  . Inter-disciplinary care team collaboration (see longitudinal plan of care) . Chart reviewed . Previously discussed current eating habits  o Doesn't plan meals. Mainly eats junk food and sandwiches o Likes peanut butter o No nutritional supplements o Daughter-in-law will bring a fixed plate for her a couple times a week o Eats alone o Prepared meals in the past for her now deceased husband and her grandson that no longer lives with her . Encouraged routine meals with lean protein, frutis/vegetables o States that she felt good this morning and ate a biscuit with country ham and tomato . Recommended nutritional supplement like Boost . Previously prepared handout on meal planning and mailed to patient . Suggested she talk with LCSW regarding psychosocial concerns  Patient Self Care Activities:  . Performs ADL's independently . Performs IADL's independently   Please see past updates related to this goal by clicking on the "Past Updates" button in the selected goal            Plan:   The care management team will reach out to the patient again over the next 15 days.    Demetrios Loll, BSN, RN-BC Embedded Chronic Care Manager [facility] / [facility] Care Management Direct Dial: [phone]

## 2019-07-29 NOTE — Patient Instructions (Signed)
Visit Information  Goals Addressed            This Visit's Progress   . "I don't want to fall"       CARE PLAN ENTRY (see longitudinal plan of care for additional care plan information)  Current Barriers:  Marland Kitchen Knowledge Deficits related to fall prevention . History of falls . Problems with balance  Nurse Case Manager Clinical Goal(s):  Marland Kitchen Over the next 30 days, patient will meet with RN Care Manager to address fall prevention  Interventions:  . Inter-disciplinary care team collaboration (see longitudinal plan of care) . Chart reviewed . Assessed for falls since last visit o Uses walker but it doesn't fit in the bathroom o Had one episode where she woke up shaking all over and she fell going into the bathroom o No major injury o No episodes of shaking since then . Discussed fall prevention strategies o Encouraged patient to move purposefully and change positions slowly o Encouraged use of walker at all times o Discussed incontinence as a fall risk and management of incontinence o Encourage self and pet awareness at all times o Encouraged patent to keep a clear pathway through her home o Recommended keeping a light on at night . Encouraged patient to use walker at all times . Discussed home health physical therapy strength and gait training. Currently receiving services twice a week.  . Discussed blood sugar and symptoms of hypoglycemia . Encouraged patient to check blood sugar daily and PRN . Previously referred patient to [facility] for medical alert system . Provided with CCM contact information and encouraged to reach out as needed  Patient Self Care Activities:  . Performs ADL's independently . Performs IADL's independently  Initial goal documentation     . "I want to be able to get around more easily"       [place] (see longitudinal plan of care for additional care plan information)  Current Barriers:  Marland Kitchen Knowledge Deficits related to exercise . Chronic  back pain  Nurse Case Manager Clinical Goal(s):  Marland Kitchen Over the next 30 days, patient will meet with RN Care Manager to address increasing physical activity level  Interventions:  . Inter-disciplinary care team collaboration (see longitudinal plan of care) . Chart reviewed . Discussed current activity level . Discussed home health physical therapy services . 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 Previously recommended chair exercises and mailed resources  . Discussed ambulation and use of walker  Patient Self Care Activities:  . Performs ADL's independently . Performs IADL's independently   Please see past updates related to this goal by clicking on the "Past Updates" button in the selected goal      . COMPLETED: [place] (see longitudinal plan of care for additional care plan information)  Home health services need in a patient with HTN, COPD, DM, OA and recent physical deconditioning and increased falls.   Current Barriers:  . Local home health agencies staffing issues and insurance issues . Film/video editor.  . Transportation barriers  Nurse Case Manager Clinical Goal(s):  Marland Kitchen Over the next 7 days, patient will have home health services initiated.   Interventions:  . Inter-disciplinary care team collaboration (see longitudinal plan of care) . Chart reviewed including recent office notes and referral . Collaborated with Jamelle Haring, LPN who manages home health referrals for [facility] o She has sent a referral to  all local home health agencies that are in network with [facility] and one that is not in network, and no one is able to take Ms [place] on at this time . Previously communicated with Livia Snellen, Assistant Clinical Director of Care Management with [facility] and Lynden Ang, Vice President of Utilization Management with [facility] regarding our inability to access home health care for this patient . RN will  reach back out to [place] to see if they have any more recommendations  Patient Self Care Activities:  . Performs ADL's independently . Performs IADL's independently . Unable to independently drive  Please see past updates related to this goal by clicking on the "Past Updates" button in the selected goal   [facility] was able to take patient     . Meal Planning       CARE PLAN ENTRY (see longitudinal plan of care for additional care plan information)  Current Barriers:  Marland Kitchen Knowledge Deficits related to meal planning for 1  Nurse Case Manager Clinical Goal(s):  Marland Kitchen Over the next 30 days, patient will work with Medical illustrator to address needs related to meal planning  Interventions:  . Inter-disciplinary care team collaboration (see longitudinal plan of care) . Chart reviewed . Previously discussed current eating habits o Doesn't plan meals. Mainly eats junk food and sandwiches o Likes peanut butter o No nutritional supplements o Daughter-in-law will bring a fixed plate for her a couple times a week o Eats alone o Prepared meals in the past for her now deceased husband and her grandson that no longer lives with her . Encouraged routine meals with lean protein, frutis/vegetables o States that she felt good this morning and ate a biscuit with country ham and tomato . Recommended nutritional supplement like Boost . Previously prepared handout on meal planning and mailed to patient . Suggested she talk with LCSW regarding psychosocial concerns  Patient Self Care Activities:  . Performs ADL's independently . Performs IADL's independently   Please see past updates related to this goal by clicking on the "Past Updates" button in the selected goal         The patient verbalized understanding of instructions provided today and declined a print copy of patient instruction materials.   Follow-up Plan The care management team will reach out to the patient again  over the next 15 days.   Demetrios Loll, BSN, RN-BC Embedded Chronic Care Manager [facility] / [facility] Care Management Direct Dial: [phone]

## 2019-07-30 ENCOUNTER — Telehealth: Payer: Self-pay | Admitting: Family Medicine

## 2019-07-30 NOTE — Telephone Encounter (Signed)
   SF 07/30/2019   Name: Laura Dalton   MRN: [id number]   DOB: 1951-01-16   AGE: 69 y.o.   GENDER: female   PCP Dettinger, Elige Radon, MD.   Called pt regarding Community Resource Referral for Med Alert System. Patient did not answer, could not leave voicemail due to no voicemail set-up. Care Guide will try to contact patient in a couple of days.    [facility] Care Guide, Embedded Care Coordination [facility], Care Management Phone: [phone] Email: [email or web address]

## 2019-07-30 NOTE — Telephone Encounter (Signed)
Spoke with Rolland Porter from [facility]. She stated that they have several diffent types of services for Med Alert Systems.  They will maintain and install the unit at no charge. Will charge monthly fee while device is in the home. Claris Che stated she will need to know what type of unit the patient is interested in. They have two units available depending on the type of service you have. For devices connected to land lines the cost will be $21 a month and independent devices without land line is $36-40. There is no contract, if an individual decides that they no longer want the unit, they must turn in the device back in. Claris Che also stated that the patient can call her directly at [phone].

## 2019-07-31 DIAGNOSIS — H5213 Myopia, bilateral: Secondary | ICD-10-CM | POA: Diagnosis not present

## 2019-07-31 DIAGNOSIS — H40011 Open angle with borderline findings, low risk, right eye: Secondary | ICD-10-CM | POA: Diagnosis not present

## 2019-07-31 DIAGNOSIS — H40012 Open angle with borderline findings, low risk, left eye: Secondary | ICD-10-CM | POA: Diagnosis not present

## 2019-07-31 DIAGNOSIS — H11153 Pinguecula, bilateral: Secondary | ICD-10-CM | POA: Diagnosis not present

## 2019-07-31 DIAGNOSIS — H25013 Cortical age-related cataract, bilateral: Secondary | ICD-10-CM | POA: Diagnosis not present

## 2019-07-31 DIAGNOSIS — H2513 Age-related nuclear cataract, bilateral: Secondary | ICD-10-CM | POA: Diagnosis not present

## 2019-07-31 DIAGNOSIS — H02403 Unspecified ptosis of bilateral eyelids: Secondary | ICD-10-CM | POA: Diagnosis not present

## 2019-07-31 DIAGNOSIS — H353132 Nonexudative age-related macular degeneration, bilateral, intermediate dry stage: Secondary | ICD-10-CM | POA: Diagnosis not present

## 2019-07-31 DIAGNOSIS — H16223 Keratoconjunctivitis sicca, not specified as Sjogren's, bilateral: Secondary | ICD-10-CM | POA: Diagnosis not present

## 2019-07-31 DIAGNOSIS — H18513 Endothelial corneal dystrophy, bilateral: Secondary | ICD-10-CM | POA: Diagnosis not present

## 2019-07-31 DIAGNOSIS — H25813 Combined forms of age-related cataract, bilateral: Secondary | ICD-10-CM | POA: Diagnosis not present

## 2019-07-31 DIAGNOSIS — H04123 Dry eye syndrome of bilateral lacrimal glands: Secondary | ICD-10-CM | POA: Diagnosis not present

## 2019-08-02 ENCOUNTER — Other Ambulatory Visit: Payer: Self-pay | Admitting: Family Medicine

## 2019-08-02 DIAGNOSIS — E1142 Type 2 diabetes mellitus with diabetic polyneuropathy: Secondary | ICD-10-CM

## 2019-08-02 DIAGNOSIS — E039 Hypothyroidism, unspecified: Secondary | ICD-10-CM

## 2019-08-06 NOTE — Telephone Encounter (Signed)
   SF 08/06/2019   Name: Laura Dalton   MRN: [id number]   DOB: [birth year]   AGE: 69 y.o.   GENDER: female   PCP Dettinger, Elige Radon, MD.   Spoke with Mrs. Gildner regarding referral for Med Alert Systems. Gave patient information for [facility] and the contact information for Rolland Porter to give them a call to learn more about the service and if she may be interested in setting up the service at her home. Patient stated that she will give Claris Che a call. Patient has not additional needs at this time.   Closing referral pending any other needs of patient.    [facility] Care Guide, Embedded Care Coordination [facility], Care Management Phone: [phone] Email: [email or web address]

## 2019-08-13 ENCOUNTER — Ambulatory Visit: Payer: PPO | Admitting: *Deleted

## 2019-08-13 DIAGNOSIS — E1142 Type 2 diabetes mellitus with diabetic polyneuropathy: Secondary | ICD-10-CM

## 2019-08-13 DIAGNOSIS — J449 Chronic obstructive pulmonary disease, unspecified: Secondary | ICD-10-CM

## 2019-08-13 NOTE — Chronic Care Management (AMB) (Signed)
°  Chronic Care Management   Follow-up Note  08/13/2019 Name: Laura Dalton MRN: [id number] DOB: April 28, 1950  Referred by: Dettinger, Elige Radon, MD Reason for referral : Chronic Care Management (RN Follow up)   An unsuccessful telephone follow-up was attempted today. The patient was referred to the case management team for assistance with care management and care coordination.   Follow Up Plan: A HIPPA compliant phone message was left for the patient providing contact information and requesting a return call.  The care management team will reach out to the patient again over the next 30 days.   Demetrios Loll, BSN, RN-BC Embedded Chronic Care Manager [facility] Care Management Direct Dial: [phone]

## 2019-08-15 ENCOUNTER — Telehealth: Payer: Self-pay | Admitting: Family Medicine

## 2019-08-15 DIAGNOSIS — I10 Essential (primary) hypertension: Secondary | ICD-10-CM | POA: Diagnosis not present

## 2019-08-15 DIAGNOSIS — E039 Hypothyroidism, unspecified: Secondary | ICD-10-CM | POA: Diagnosis not present

## 2019-08-15 DIAGNOSIS — R2681 Unsteadiness on feet: Secondary | ICD-10-CM | POA: Diagnosis not present

## 2019-08-15 DIAGNOSIS — M6281 Muscle weakness (generalized): Secondary | ICD-10-CM | POA: Diagnosis not present

## 2019-08-15 DIAGNOSIS — Z7902 Long term (current) use of antithrombotics/antiplatelets: Secondary | ICD-10-CM | POA: Diagnosis not present

## 2019-08-15 DIAGNOSIS — E1142 Type 2 diabetes mellitus with diabetic polyneuropathy: Secondary | ICD-10-CM | POA: Diagnosis not present

## 2019-08-15 DIAGNOSIS — E1169 Type 2 diabetes mellitus with other specified complication: Secondary | ICD-10-CM | POA: Diagnosis not present

## 2019-08-15 DIAGNOSIS — Z7984 Long term (current) use of oral hypoglycemic drugs: Secondary | ICD-10-CM | POA: Diagnosis not present

## 2019-08-15 DIAGNOSIS — E785 Hyperlipidemia, unspecified: Secondary | ICD-10-CM | POA: Diagnosis not present

## 2019-08-15 NOTE — Telephone Encounter (Signed)
Has this been addressed?

## 2019-08-15 NOTE — Telephone Encounter (Signed)
I do not have the form on my desk anymore so I must of signed it and sent it off, this was allowed ago so I do not recall specifically but I think this was already addressed.

## 2019-08-19 NOTE — Telephone Encounter (Signed)
Printed letter written on 06/27/19 and emailed to reqested email address.

## 2019-08-20 ENCOUNTER — Telehealth: Payer: Self-pay | Admitting: Family Medicine

## 2019-08-20 NOTE — Telephone Encounter (Signed)
Form on provider's desk.

## 2019-08-22 NOTE — Telephone Encounter (Signed)
Forms faxed Pt aware Fax confirmations put at front desk for pt

## 2019-09-02 ENCOUNTER — Ambulatory Visit: Payer: PPO | Admitting: *Deleted

## 2019-09-02 DIAGNOSIS — E1142 Type 2 diabetes mellitus with diabetic polyneuropathy: Secondary | ICD-10-CM

## 2019-09-02 DIAGNOSIS — J449 Chronic obstructive pulmonary disease, unspecified: Secondary | ICD-10-CM

## 2019-09-02 NOTE — Chronic Care Management (AMB) (Signed)
  Chronic Care Management   Follow-up Outreach  09/02/2019 Name: Laura Dalton MRN: [id number] DOB: [birth year]  Referred by: Dettinger, Elige Radon, MD Reason for referral : Chronic Care Management (RN follow up)   An unsuccessful telephone follow-up was attempted today. The patient was referred to the case management team for assistance with care management and care coordination.   Follow Up Plan: A HIPPA compliant phone message was left for the patient providing contact information and requesting a return call.  The care management team will reach out to the patient again over the next 30 days.   Demetrios Loll, BSN, RN-BC Embedded Chronic Care Manager [facility] / [facility] Care Management Direct Dial: [phone]

## 2019-09-16 ENCOUNTER — Ambulatory Visit: Payer: PPO | Admitting: Cardiology

## 2019-09-16 ENCOUNTER — Encounter: Payer: Self-pay | Admitting: Cardiology

## 2019-09-16 ENCOUNTER — Other Ambulatory Visit: Payer: Self-pay

## 2019-09-16 VITALS — BP 140/72 | HR 88 | Ht 66.0 in | Wt 186.8 lb

## 2019-09-16 DIAGNOSIS — I10 Essential (primary) hypertension: Secondary | ICD-10-CM | POA: Diagnosis not present

## 2019-09-16 DIAGNOSIS — E782 Mixed hyperlipidemia: Secondary | ICD-10-CM | POA: Diagnosis not present

## 2019-09-16 DIAGNOSIS — R0789 Other chest pain: Secondary | ICD-10-CM

## 2019-09-16 MED ORDER — LISINOPRIL 40 MG PO TABS: 40.0000 mg | ORAL_TABLET | Freq: Every day | ORAL | 1 refills | Status: DC

## 2019-09-16 NOTE — Patient Instructions (Signed)
Your physician wants you to follow-up in: 1 YEAR WITH DR [facility] You will receive a reminder letter in the mail two months in advance. If you don't receive a letter, please call our office to schedule the follow-up appointment.  Your physician has recommended you make the following change in your medication:   INCREASE LISINOPRIL 40 MG DAILY   Your physician recommends that you return for lab work in: 2 WEEKS BMP/MG   Thank you for choosing [facility]!!

## 2019-09-16 NOTE — Progress Notes (Signed)
Clinical Summary Laura Dalton is a 69 y.o.female seen today for follow up of the following medical problems.   1. Chest pain - patient seen previously for episodes of chest pain. She has an abnormal EKG at baseline, and completed an echo and stress echo that were overall unremarkable at that time.   - no recent chest pain.    2. HTN -compliant with meds    3. HL - 06/2019 TC 123 TG 114 HDL 42 LDL 60 - compliant with statin  4. CVA - admit 07/2015 with CVA. Has no long term residual deficits.  - ASA allergy, on plavix for secondary prevention  5. Carotid stenosis - carotid US 07/2015 mild bilateral <50% ICA disease  6. CAD - noted on 06/2018 CT A/P, as well as moderate atherosclerosis - no recent chest pains.      Has not been vaccinated.  Past Medical History:  Diagnosis Date  . Bursitis of hip   . Diabetes mellitus without complication (HCC)    type II  . Hyperlipidemia   . Hypertension   . Hypothyroidism   . Low back pain    benign turmor back  . Stroke (HCC)   . Vitamin D deficiency disease      Allergies  Allergen Reactions  . Aspirin Hives     Current Outpatient Medications  Medication Sig Dispense Refill  . amitriptyline (ELAVIL) 100 MG tablet Take 1 tablet (100 mg total) by mouth at bedtime. 90 tablet 3  . atorvastatin (LIPITOR) 40 MG tablet Take 1 tablet (40 mg total) by mouth daily. 90 tablet 3  . baclofen (LIORESAL) 10 MG tablet Take 1 tablet (10 mg total) by mouth 3 (three) times daily as needed for muscle spasms. 60 each 2  . calcium carbonate (OSCAL) 1500 (600 Ca) MG TABS tablet Take by mouth 2 (two) times daily with a meal.    . clopidogrel (PLAVIX) 75 MG tablet Take 1 tablet (75 mg total) by mouth daily. 90 tablet 3  . EUTHYROX 88 MCG tablet TAKE 1 TABLET BY MOUTH ONCE DAILY BEFORE BREAKFAST 90 tablet 3  . gabapentin (NEURONTIN) 600 MG tablet Take 1 tablet by mouth 4 times daily 360 tablet 0  . lidocaine (XYLOCAINE) 5 % ointment  Apply 1 application topically 3 (three) times daily as needed. 35.44 g 0  . lisinopril (ZESTRIL) 20 MG tablet Take 1 tablet (20 mg total) by mouth daily. 90 tablet 3  . meloxicam (MOBIC) 15 MG tablet Take 1 tablet by mouth once daily 90 tablet 0  . metFORMIN (GLUCOPHAGE) 500 MG tablet Take 1 tablet (500 mg total) by mouth daily. 90 tablet 3  . triamcinolone cream (KENALOG) 0.1 % Apply 1 application topically 2 (two) times daily. 80 g 0  . vitamin C (ASCORBIC ACID) 500 MG tablet Take 500 mg by mouth daily.     No current facility-administered medications for this visit.     Past Surgical History:  Procedure Laterality Date  . APPENDECTOMY    . BACK SURGERY  1996   turmor on back   . CHOLECYSTECTOMY    . LEG SURGERY Right    "clot and had drain in leg  rt lower leg     Allergies  Allergen Reactions  . Aspirin Hives      Family History  Problem Relation Age of Onset  . Diabetes Mother   . Stroke Mother   . Seizures Mother   . Cancer Father  prostate  . Thyroid disease Son   . Hashimoto's thyroiditis Son   . COPD Sister   . Cancer Brother        lung  . COPD Sister   . COPD Sister   . Cancer Brother        prostate  . COPD Brother   . COPD Brother      Social History Ms. Ryles reports that she quit smoking about 11 years ago. Her smoking use included cigarettes. She started smoking about 57 years ago. She has a 22.50 pack-year smoking history. She has never used smokeless tobacco. Ms. Cockerham reports no history of alcohol use.   Review of Systems CONSTITUTIONAL: No weight loss, fever, chills, weakness or fatigue.  HEENT: Eyes: No visual loss, blurred vision, double vision or yellow sclerae.No hearing loss, sneezing, congestion, runny nose or sore throat.  SKIN: No rash or itching.  CARDIOVASCULAR: per hpi RESPIRATORY: No shortness of breath, cough or sputum.  GASTROINTESTINAL: No anorexia, nausea, vomiting or diarrhea. No abdominal pain or blood.    GENITOURINARY: No burning on urination, no polyuria NEUROLOGICAL: No headache, dizziness, syncope, paralysis, ataxia, numbness or tingling in the extremities. No change in bowel or bladder control.  MUSCULOSKELETAL: No muscle, back pain, joint pain or stiffness.  LYMPHATICS: No enlarged nodes. No history of splenectomy.  PSYCHIATRIC: No history of depression or anxiety.  ENDOCRINOLOGIC: No reports of sweating, cold or heat intolerance. No polyuria or polydipsia.  Marland Kitchen   Physical Examination Today's Vitals   09/16/19 1420  BP: 140/72  Pulse: 88  SpO2: 92%  Weight: 186 lb 12.8 oz (84.7 kg)  Height: 5\' 6"  (1.676 m)   Body mass index is 30.15 kg/m.  Gen: resting comfortably, no acute distress HEENT: no scleral icterus, pupils equal round and reactive, no palptable cervical adenopathy,  CV: RRR, no m/r/g, no jvd Resp: Clear to auscultation bilaterally GI: abdomen is soft, non-tender, non-distended, normal bowel sounds, no hepatosplenomegaly MSK: extremities are warm, no edema.  Skin: warm, no rash Neuro:  no focal deficits Psych: appropriate affect   Diagnostic Studies 01/06/13 EKG: sinus rhythm, poor R-wave progression, non-specific ST/T changes  01/2013 echo Study Conclusions  - Left ventricle: The cavity size was normal. Wall thickness was increased in a pattern of mild LVH. Systolic function was normal. The estimated ejection fraction was in the range of 60% to 65%. Wall motion was normal; there were no regional wall motion abnormalities. Doppler parameters are consistent with abnormal left ventricular relaxation (grade 1 diastolic dysfunction). - Aortic valve: Trileaflet; mildly thickened leaflets. No significant regurgitation. Mean gradient: 26mm Hg (S). - Mitral valve: Mildly thickened leaflets . Trivial regurgitation. - Right atrium: Central venous pressure: 41mm Hg (est). - Atrial septum: No defect or patent foramen ovale was identified. -  Tricuspid valve: Physiologic regurgitation. - Pulmonary arteries: PA peak pressure: 75mm Hg (S). - Pericardium, extracardiac: There was no pericardial effusion. Impressions:  - No prior study for comparison. Mild LVH with LVEF 60-65%, grade 1 diastolic dysfunction. Mildly thickened aortic and mitral valves, nosignificant regurgitant lesions. PASP 30 mm mercury. No pericardial effusion.  01/2013 Stress echo Study Conclusions  - Stress ECG conclusions: The stress ECG was negative for ischemia. - Staged echo: There was no echocardiographic evidence for stress-induced ischemia.    Assessment and Plan  1. Chest pain -no recent symptoms, negative stress testing few years back - has had some CAD noted on prior imaging - continue risk factor modification - EKG today shows SR,  no ischemic changes  2. HTN  - bp above goal of <130/80, increase lisinopril to 40mg  daily. Check BMET in 2 weeks  3. Hyperlipidemia  -she is at goal, continue statin   F/u 1 year   , M.D.,

## 2019-09-18 ENCOUNTER — Other Ambulatory Visit: Payer: Self-pay | Admitting: Family Medicine

## 2019-09-26 ENCOUNTER — Other Ambulatory Visit: Payer: Self-pay

## 2019-09-26 ENCOUNTER — Encounter: Payer: Self-pay | Admitting: Family Medicine

## 2019-09-26 ENCOUNTER — Ambulatory Visit (INDEPENDENT_AMBULATORY_CARE_PROVIDER_SITE_OTHER): Payer: PPO | Admitting: Family Medicine

## 2019-09-26 VITALS — BP 121/65 | HR 68 | Temp 97.6°F | Ht 66.0 in | Wt 185.0 lb

## 2019-09-26 DIAGNOSIS — E785 Hyperlipidemia, unspecified: Secondary | ICD-10-CM

## 2019-09-26 DIAGNOSIS — E1142 Type 2 diabetes mellitus with diabetic polyneuropathy: Secondary | ICD-10-CM | POA: Diagnosis not present

## 2019-09-26 DIAGNOSIS — E039 Hypothyroidism, unspecified: Secondary | ICD-10-CM

## 2019-09-26 DIAGNOSIS — E1169 Type 2 diabetes mellitus with other specified complication: Secondary | ICD-10-CM | POA: Diagnosis not present

## 2019-09-26 DIAGNOSIS — I10 Essential (primary) hypertension: Secondary | ICD-10-CM | POA: Diagnosis not present

## 2019-09-26 DIAGNOSIS — E1159 Type 2 diabetes mellitus with other circulatory complications: Secondary | ICD-10-CM

## 2019-09-26 LAB — BAYER DCA HB A1C WAIVED: HB A1C (BAYER DCA - WAIVED): 6.1 % (ref <7.0)

## 2019-09-26 NOTE — Progress Notes (Signed)
BP 121/65   Pulse 68   Temp 97.6 F (36.4 C)   Ht 5\' 6"  (1.676 m)   Wt 185 lb (83.9 kg)   SpO2 98%   BMI 29.86 kg/m    Subjective:   Patient ID: , female    DOB: 1950/11/12, 69 y.o.   MRN: [id number]  HPI: Laura Dalton is a 69 y.o. female presenting on 09/26/2019 for Medical Management of Chronic Issues and Diabetes   HPI Type 2 diabetes mellitus Patient comes in today for recheck of his diabetes. Patient has been currently taking Metformin 500, A1c is 6.1 today. Patient is currently on an ACE inhibitor/ARB. Patient has not seen an ophthalmologist this year. Patient denies any issues with their feet. The symptom started onset as an adult hypothyroidism hypertension and hyperlipidemia and peripheral neuropathy ARE RELATED TO DM   Hypothyroidism recheck Patient is coming in for thyroid recheck today as well. They deny any issues with hair changes or heat or cold problems or diarrhea or constipation. They deny any chest pain or palpitations. They are currently on levothyroxine 88 micrograms   Hypertension Patient is currently on lisinopril, and their blood pressure today is 121/65. Patient denies any lightheadedness or dizziness. Patient denies headaches, blurred vision, chest pains, shortness of breath, or weakness. Denies any side effects from medication and is content with current medication.   Hyperlipidemia Patient is coming in for recheck of his hyperlipidemia. The patient is currently taking atorvastatin. They deny any issues with myalgias or history of liver damage from it. They deny any focal numbness or weakness or chest pain.   Relevant past medical, surgical, family and social history reviewed and updated as indicated. Interim medical history since our last visit reviewed. Allergies and medications reviewed and updated.  Review of Systems  Constitutional: Negative for chills and fever.  Eyes: Negative for visual disturbance.  Respiratory: Negative  for chest tightness and shortness of breath.   Cardiovascular: Negative for chest pain and leg swelling.  Musculoskeletal: Positive for arthralgias and gait problem (Uses a cane). Negative for back pain.  Skin: Negative for rash.  Neurological: Positive for numbness. Negative for light-headedness and headaches.  Psychiatric/Behavioral: Negative for agitation and behavioral problems.  All other systems reviewed and are negative.   Per HPI unless specifically indicated above   Allergies as of 09/26/2019      Reactions   Aspirin Hives      Medication List       Accurate as of September 26, 2019 11:48 AM. If you have any questions, ask your nurse or doctor.        STOP taking these medications   meloxicam 15 MG tablet Commonly known as: MOBIC Stopped by: September 28, 2019 Brooklynne Pereida, MD     TAKE these medications   amitriptyline 100 MG tablet Commonly known as: ELAVIL Take 1 tablet (100 mg total) by mouth at bedtime.   atorvastatin 40 MG tablet Commonly known as: LIPITOR Take 1 tablet (40 mg total) by mouth daily.   baclofen 10 MG tablet Commonly known as: LIORESAL Take 1 tablet (10 mg total) by mouth 3 (three) times daily as needed for muscle spasms.   calcium carbonate 1500 (600 Ca) MG Tabs tablet Commonly known as: OSCAL Take by mouth 2 (two) times daily with a meal.   clopidogrel 75 MG tablet Commonly known as: PLAVIX Take 1 tablet by mouth once daily   Euthyrox 88 MCG tablet Generic drug: levothyroxine TAKE 1 TABLET BY  MOUTH ONCE DAILY BEFORE BREAKFAST   gabapentin 600 MG tablet Commonly known as: NEURONTIN Take 1 tablet by mouth 4 times daily   lidocaine 5 % ointment Commonly known as: XYLOCAINE Apply 1 application topically 3 (three) times daily as needed.   lisinopril 40 MG tablet Commonly known as: ZESTRIL Take 1 tablet (40 mg total) by mouth daily.   metFORMIN 500 MG tablet Commonly known as: GLUCOPHAGE Take 1 tablet (500 mg total) by mouth daily.     triamcinolone cream 0.1 % Commonly known as: KENALOG Apply 1 application topically 2 (two) times daily.   vitamin C 500 MG tablet Commonly known as: ASCORBIC ACID Take 500 mg by mouth daily.        Objective:   BP 121/65   Pulse 68   Temp 97.6 F (36.4 C)   Ht 5\' 6"  (1.676 m)   Wt 185 lb (83.9 kg)   SpO2 98%   BMI 29.86 kg/m   Wt Readings from Last 3 Encounters:  09/26/19 185 lb (83.9 kg)  09/16/19 186 lb 12.8 oz (84.7 kg)  06/27/19 198 lb (89.8 kg)    Physical Exam Vitals and nursing note reviewed.  Constitutional:      General: She is not in acute distress.    Appearance: She is well-developed. She is not diaphoretic.     Comments: Walks with a cane due to joint issues in her hips and knees  Eyes:     Conjunctiva/sclera: Conjunctivae normal.  Cardiovascular:     Rate and Rhythm: Normal rate and regular rhythm.     Heart sounds: Normal heart sounds. No murmur heard.   Pulmonary:     Effort: Pulmonary effort is normal. No respiratory distress.     Breath sounds: Normal breath sounds. No wheezing.  Musculoskeletal:        General: No tenderness. Normal range of motion.  Skin:    General: Skin is warm and dry.     Findings: No rash.  Neurological:     Mental Status: She is alert and oriented to person, place, and time.     Coordination: Coordination normal.  Psychiatric:        Behavior: Behavior normal.       Assessment & Plan:   Problem List Items Addressed This Visit      Cardiovascular and Mediastinum   Hypertension associated with diabetes (HCC)     Endocrine   DM (diabetes mellitus) (HCC) - Primary   Relevant Orders   Bayer DCA Hb A1c Waived   Hyperlipidemia associated with type 2 diabetes mellitus (HCC)   Diabetic peripheral neuropathy associated with type 2 diabetes mellitus (HCC)   Hypothyroidism   Relevant Orders   TSH      A1c 6.1, doing well, we will recheck her thyroid today, the rest of her blood work seems to be doing  good. Follow up plan: Return in about 3 months (around 12/27/2019), or if symptoms worsen or fail to improve, for Diabetes and hypertension recheck.  Counseling provided for all of the vaccine components Orders Placed This Encounter  Procedures  . Bayer DCA Hb A1c Waived  . TSH    12/29/2019, MD [facility] 09/26/2019, 11:48 AM

## 2019-09-27 LAB — TSH: TSH: 2.15 u[IU]/mL (ref 0.450–4.500)

## 2019-10-16 DIAGNOSIS — S90852A Superficial foreign body, left foot, initial encounter: Secondary | ICD-10-CM | POA: Diagnosis not present

## 2019-10-16 DIAGNOSIS — Z029 Encounter for administrative examinations, unspecified: Secondary | ICD-10-CM

## 2019-10-16 DIAGNOSIS — M79672 Pain in left foot: Secondary | ICD-10-CM | POA: Diagnosis not present

## 2019-11-09 ENCOUNTER — Other Ambulatory Visit: Payer: Self-pay | Admitting: Family Medicine

## 2019-11-09 DIAGNOSIS — E1142 Type 2 diabetes mellitus with diabetic polyneuropathy: Secondary | ICD-10-CM

## 2019-11-11 DIAGNOSIS — B353 Tinea pedis: Secondary | ICD-10-CM | POA: Diagnosis not present

## 2019-11-15 ENCOUNTER — Other Ambulatory Visit: Payer: Self-pay | Admitting: Family Medicine

## 2019-11-15 DIAGNOSIS — E1169 Type 2 diabetes mellitus with other specified complication: Secondary | ICD-10-CM

## 2019-11-24 ENCOUNTER — Other Ambulatory Visit: Payer: Self-pay | Admitting: Family Medicine

## 2019-11-24 DIAGNOSIS — E1142 Type 2 diabetes mellitus with diabetic polyneuropathy: Secondary | ICD-10-CM

## 2019-11-26 ENCOUNTER — Other Ambulatory Visit: Payer: Self-pay | Admitting: Family Medicine

## 2019-11-26 DIAGNOSIS — E1142 Type 2 diabetes mellitus with diabetic polyneuropathy: Secondary | ICD-10-CM

## 2019-12-31 ENCOUNTER — Other Ambulatory Visit: Payer: Self-pay

## 2019-12-31 ENCOUNTER — Ambulatory Visit (INDEPENDENT_AMBULATORY_CARE_PROVIDER_SITE_OTHER): Payer: PPO | Admitting: Family Medicine

## 2019-12-31 ENCOUNTER — Encounter: Payer: Self-pay | Admitting: Family Medicine

## 2019-12-31 VITALS — BP 142/69 | HR 60 | Temp 97.0°F | Ht 66.0 in | Wt 177.0 lb

## 2019-12-31 DIAGNOSIS — E1169 Type 2 diabetes mellitus with other specified complication: Secondary | ICD-10-CM | POA: Diagnosis not present

## 2019-12-31 DIAGNOSIS — Z1211 Encounter for screening for malignant neoplasm of colon: Secondary | ICD-10-CM

## 2019-12-31 DIAGNOSIS — E039 Hypothyroidism, unspecified: Secondary | ICD-10-CM | POA: Diagnosis not present

## 2019-12-31 DIAGNOSIS — E1159 Type 2 diabetes mellitus with other circulatory complications: Secondary | ICD-10-CM | POA: Diagnosis not present

## 2019-12-31 DIAGNOSIS — E1142 Type 2 diabetes mellitus with diabetic polyneuropathy: Secondary | ICD-10-CM

## 2019-12-31 DIAGNOSIS — I152 Hypertension secondary to endocrine disorders: Secondary | ICD-10-CM | POA: Diagnosis not present

## 2019-12-31 DIAGNOSIS — E785 Hyperlipidemia, unspecified: Secondary | ICD-10-CM | POA: Diagnosis not present

## 2019-12-31 LAB — BAYER DCA HB A1C WAIVED: HB A1C (BAYER DCA - WAIVED): 5.8 % (ref <7.0)

## 2019-12-31 MED ORDER — CLOPIDOGREL BISULFATE 75 MG PO TABS
75.0000 mg | ORAL_TABLET | Freq: Every day | ORAL | 3 refills | Status: DC
Start: 2019-12-31 — End: 2021-01-24

## 2019-12-31 MED ORDER — METFORMIN HCL 500 MG PO TABS: 500.0000 mg | ORAL_TABLET | Freq: Every day | ORAL | 3 refills | Status: DC

## 2019-12-31 MED ORDER — ATORVASTATIN CALCIUM 40 MG PO TABS: 40.0000 mg | ORAL_TABLET | Freq: Every day | ORAL | 3 refills | Status: DC

## 2019-12-31 MED ORDER — AMITRIPTYLINE HCL 100 MG PO TABS: 100.0000 mg | ORAL_TABLET | Freq: Every day | ORAL | 3 refills | Status: DC

## 2019-12-31 NOTE — Progress Notes (Signed)
BP (!) 142/69   Pulse 60   Temp (!) 97 F (36.1 C)   Ht 5' 6"  (1.676 m)   Wt 177 lb (80.3 kg)   SpO2 100%   BMI 28.57 kg/m    Subjective:   Patient ID: Laura Dalton, female    DOB: 01/12/1951, 69 y.o.   MRN: [id number]  HPI: Laura Dalton is a 69 y.o. female presenting on 12/31/2019 for Medical Management of Chronic Issues, Diabetes, Hypertension, and Hypothyroidism   HPI Type 2 diabetes mellitus Patient comes in today for recheck of his diabetes. Patient has been currently taking Metformin, A1c 5.8. Patient is currently on an ACE inhibitor/ARB. Patient has seen an ophthalmologist this year. Patient denies any new issues with their feet, she does have neuropathy especially on the right leg.. The symptom started onset as an adult hypothyroidism hyperlipidemia and hypertension ARE RELATED TO DM   Hypothyroidism recheck Patient is coming in for thyroid recheck today as well. They deny any issues with hair changes or heat or cold problems or diarrhea or constipation. They deny any chest pain or palpitations. They are currently on levothyroxine 88 micrograms   Hyperlipidemia Patient is coming in for recheck of his hyperlipidemia. The patient is currently taking atorvastatin. They deny any issues with myalgias or history of liver damage from it. They deny any focal numbness or weakness or chest pain.   Hypertension Patient is currently on lisinopril, and their blood pressure today is 142/69. Patient denies any lightheadedness or dizziness. Patient denies headaches, blurred vision, chest pains, shortness of breath, or weakness. Denies any side effects from medication and is content with current medication.   Relevant past medical, surgical, family and social history reviewed and updated as indicated. Interim medical history since our last visit reviewed. Allergies and medications reviewed and updated.  Review of Systems  Constitutional: Negative for chills and fever.  Eyes:  Negative for redness and visual disturbance.  Respiratory: Negative for chest tightness and shortness of breath.   Cardiovascular: Negative for chest pain and leg swelling.  Genitourinary: Negative for difficulty urinating and dysuria.  Musculoskeletal: Negative for back pain and gait problem.  Skin: Negative for rash.  Neurological: Positive for numbness. Negative for weakness, light-headedness and headaches.  Psychiatric/Behavioral: Negative for agitation and behavioral problems.  All other systems reviewed and are negative.   Per HPI unless specifically indicated above   Allergies as of 12/31/2019      Reactions   Aspirin Hives   Gabapentin Other (See Comments)   Dizzy,falls      Medication List       Accurate as of December 31, 2019 11:19 AM. If you have any questions, ask your nurse or doctor.        STOP taking these medications   gabapentin 600 MG tablet Commonly known as: NEURONTIN Stopped by: Fransisca Kaufmann Raphael Espe, MD     TAKE these medications   amitriptyline 100 MG tablet Commonly known as: ELAVIL Take 1 tablet (100 mg total) by mouth at bedtime.   atorvastatin 40 MG tablet Commonly known as: LIPITOR Take 1 tablet (40 mg total) by mouth daily.   baclofen 10 MG tablet Commonly known as: LIORESAL Take 1 tablet (10 mg total) by mouth 3 (three) times daily as needed for muscle spasms.   calcium carbonate 1500 (600 Ca) MG Tabs tablet Commonly known as: OSCAL Take by mouth 2 (two) times daily with a meal.   clopidogrel 75 MG tablet Commonly known as:  PLAVIX Take 1 tablet (75 mg total) by mouth daily.   Euthyrox 88 MCG tablet Generic drug: levothyroxine TAKE 1 TABLET BY MOUTH ONCE DAILY BEFORE BREAKFAST   lidocaine 5 % ointment Commonly known as: XYLOCAINE Apply 1 application topically 3 (three) times daily as needed.   lisinopril 40 MG tablet Commonly known as: ZESTRIL Take 1 tablet (40 mg total) by mouth daily.   metFORMIN 500 MG tablet Commonly  known as: GLUCOPHAGE Take 1 tablet (500 mg total) by mouth daily.   triamcinolone cream 0.1 % Commonly known as: KENALOG Apply 1 application topically 2 (two) times daily.   vitamin C 500 MG tablet Commonly known as: ASCORBIC ACID Take 500 mg by mouth daily.        Objective:   BP (!) 142/69   Pulse 60   Temp (!) 97 F (36.1 C)   Ht 5' 6"  (1.676 m)   Wt 177 lb (80.3 kg)   SpO2 100%   BMI 28.57 kg/m   Wt Readings from Last 3 Encounters:  12/31/19 177 lb (80.3 kg)  09/26/19 185 lb (83.9 kg)  09/16/19 186 lb 12.8 oz (84.7 kg)    Physical Exam Vitals and nursing note reviewed.  Constitutional:      General: She is not in acute distress.    Appearance: She is well-developed. She is not diaphoretic.  Eyes:     Conjunctiva/sclera: Conjunctivae normal.  Cardiovascular:     Rate and Rhythm: Normal rate and regular rhythm.     Heart sounds: Normal heart sounds. No murmur heard.   Pulmonary:     Effort: Pulmonary effort is normal. No respiratory distress.     Breath sounds: Normal breath sounds. No wheezing.  Musculoskeletal:        General: No tenderness. Normal range of motion.  Skin:    General: Skin is warm and dry.     Findings: No rash.  Neurological:     Mental Status: She is alert and oriented to person, place, and time.     Coordination: Coordination normal.  Psychiatric:        Behavior: Behavior normal.    Diabetic Foot Exam - Simple   Simple Foot Form Diabetic Foot exam was performed with the following findings: Yes 12/31/2019 11:17 AM  Visual Inspection No deformities, no ulcerations, no other skin breakdown bilaterally: Yes Sensation Testing Intact to touch and monofilament testing bilaterally: Yes Pulse Check Posterior Tibialis and Dorsalis pulse intact bilaterally: Yes Comments     A1c 5.9  Assessment & Plan:   Problem List Items Addressed This Visit      Cardiovascular and Mediastinum   Hypertension associated with diabetes ([place])    Relevant Medications   atorvastatin (LIPITOR) 40 MG tablet   metFORMIN (GLUCOPHAGE) 500 MG tablet   Other Relevant Orders   CMP14+EGFR     Endocrine   DM (diabetes mellitus) ([place]) - Primary   Relevant Medications   atorvastatin (LIPITOR) 40 MG tablet   metFORMIN (GLUCOPHAGE) 500 MG tablet   Other Relevant Orders   Microalbumin / creatinine urine ratio   Bayer DCA Hb A1c Waived   CBC with Differential/Platelet   Hyperlipidemia associated with type 2 diabetes mellitus (HCC)   Relevant Medications   atorvastatin (LIPITOR) 40 MG tablet   metFORMIN (GLUCOPHAGE) 500 MG tablet   Other Relevant Orders   Lipid panel   Diabetic peripheral neuropathy associated with type 2 diabetes mellitus (HCC)   Relevant Medications   amitriptyline (ELAVIL) 100 MG  tablet   atorvastatin (LIPITOR) 40 MG tablet   metFORMIN (GLUCOPHAGE) 500 MG tablet   Hypothyroidism   Relevant Orders   TSH    Other Visit Diagnoses    Colon cancer screening       Relevant Orders   Cologuard      Patient's hemoglobin A1c is 5.8 which looks good today.  Discussed coming off the Metformin and she would like to continue forward with that for now.  Denies hypoglycemic episodes.  Continue current medication. Follow up plan: Return in about 3 months (around 04/01/2020), or if symptoms worsen or fail to improve, for Diabetes and thyroid recheck.  Counseling provided for all of the vaccine components Orders Placed This Encounter  Procedures  . Microalbumin / creatinine urine ratio  . Bayer DCA Hb A1c Waived  . Cologuard  . CBC with Differential/Platelet  . CMP14+EGFR  . Lipid panel  . TSH    Caryl Pina, MD [facility] 12/31/2019, 11:19 AM

## 2020-01-01 LAB — CMP14+EGFR
ALT: 7 IU/L (ref 0–32)
AST: 13 IU/L (ref 0–40)
Albumin/Globulin Ratio: 2.2 (ref 1.2–2.2)
Albumin: 4.7 g/dL (ref 3.8–4.8)
Alkaline Phosphatase: 92 IU/L (ref 44–121)
BUN/Creatinine Ratio: 7 — ABNORMAL LOW (ref 12–28)
BUN: 6 mg/dL — ABNORMAL LOW (ref 8–27)
Bilirubin Total: 0.4 mg/dL (ref 0.0–1.2)
CO2: 26 mmol/L (ref 20–29)
Calcium: 9.5 mg/dL (ref 8.7–10.3)
Chloride: 101 mmol/L (ref 96–106)
Creatinine, Ser: 0.89 mg/dL (ref 0.57–1.00)
GFR calc Af Amer: 76 mL/min/{1.73_m2} (ref >59)
GFR calc non Af Amer: 66 mL/min/{1.73_m2} (ref >59)
Globulin, Total: 2.1 g/dL (ref 1.5–4.5)
Glucose: 105 mg/dL — ABNORMAL HIGH (ref 65–99)
Potassium: 4.2 mmol/L (ref 3.5–5.2)
Sodium: 142 mmol/L (ref 134–144)
Total Protein: 6.8 g/dL (ref 6.0–8.5)

## 2020-01-01 LAB — CBC WITH DIFFERENTIAL/PLATELET
Basophils Absolute: 0 10*3/uL (ref 0.0–0.2)
Basos: 0 %
EOS (ABSOLUTE): 0.4 10*3/uL (ref 0.0–0.4)
Eos: 5 %
Hematocrit: 38 % (ref 34.0–46.6)
Hemoglobin: 12.6 g/dL (ref 11.1–15.9)
Immature Grans (Abs): 0 10*3/uL (ref 0.0–0.1)
Immature Granulocytes: 0 %
Lymphocytes Absolute: 1.5 10*3/uL (ref 0.7–3.1)
Lymphs: 18 %
MCH: 27 pg (ref 26.6–33.0)
MCHC: 33.2 g/dL (ref 31.5–35.7)
MCV: 82 fL (ref 79–97)
Monocytes Absolute: 0.8 10*3/uL (ref 0.1–0.9)
Monocytes: 10 %
Neutrophils Absolute: 5.2 10*3/uL (ref 1.4–7.0)
Neutrophils: 67 %
Platelets: 284 10*3/uL (ref 150–450)
RBC: 4.66 x10E6/uL (ref 3.77–5.28)
RDW: 12.8 % (ref 11.7–15.4)
WBC: 7.9 10*3/uL (ref 3.4–10.8)

## 2020-01-01 LAB — MICROALBUMIN / CREATININE URINE RATIO
Creatinine, Urine: 28.8 mg/dL
Microalb/Creat Ratio: 20 mg/g creat (ref 0–29)
Microalbumin, Urine: 5.8 ug/mL

## 2020-01-01 LAB — LIPID PANEL
Chol/HDL Ratio: 3.4 ratio (ref 0.0–4.4)
Cholesterol, Total: 136 mg/dL (ref 100–199)
HDL: 40 mg/dL (ref >39)
LDL Chol Calc (NIH): 76 mg/dL (ref 0–99)
Triglycerides: 109 mg/dL (ref 0–149)
VLDL Cholesterol Cal: 20 mg/dL (ref 5–40)

## 2020-01-01 LAB — TSH: TSH: 2.44 u[IU]/mL (ref 0.450–4.500)

## 2020-01-05 ENCOUNTER — Other Ambulatory Visit: Payer: Self-pay | Admitting: Family Medicine

## 2020-01-08 ENCOUNTER — Other Ambulatory Visit: Payer: Self-pay | Admitting: Family Medicine

## 2020-01-26 DIAGNOSIS — Z1211 Encounter for screening for malignant neoplasm of colon: Secondary | ICD-10-CM | POA: Diagnosis not present

## 2020-01-27 DIAGNOSIS — H524 Presbyopia: Secondary | ICD-10-CM | POA: Diagnosis not present

## 2020-01-27 DIAGNOSIS — H353132 Nonexudative age-related macular degeneration, bilateral, intermediate dry stage: Secondary | ICD-10-CM | POA: Diagnosis not present

## 2020-02-04 LAB — COLOGUARD: COLOGUARD: NEGATIVE

## 2020-02-17 DIAGNOSIS — M79676 Pain in unspecified toe(s): Secondary | ICD-10-CM | POA: Diagnosis not present

## 2020-02-17 DIAGNOSIS — B353 Tinea pedis: Secondary | ICD-10-CM | POA: Diagnosis not present

## 2020-02-19 LAB — COLOGUARD: Cologuard: NEGATIVE

## 2020-02-23 DIAGNOSIS — H524 Presbyopia: Secondary | ICD-10-CM | POA: Diagnosis not present

## 2020-02-23 DIAGNOSIS — H25013 Cortical age-related cataract, bilateral: Secondary | ICD-10-CM | POA: Diagnosis not present

## 2020-02-23 DIAGNOSIS — H2511 Age-related nuclear cataract, right eye: Secondary | ICD-10-CM | POA: Diagnosis not present

## 2020-02-23 DIAGNOSIS — H353132 Nonexudative age-related macular degeneration, bilateral, intermediate dry stage: Secondary | ICD-10-CM | POA: Diagnosis not present

## 2020-02-23 DIAGNOSIS — H2513 Age-related nuclear cataract, bilateral: Secondary | ICD-10-CM | POA: Diagnosis not present

## 2020-02-23 DIAGNOSIS — E119 Type 2 diabetes mellitus without complications: Secondary | ICD-10-CM | POA: Diagnosis not present

## 2020-02-23 LAB — HM DIABETES EYE EXAM

## 2020-03-07 ENCOUNTER — Other Ambulatory Visit: Payer: Self-pay | Admitting: Cardiology

## 2020-03-09 DIAGNOSIS — H2511 Age-related nuclear cataract, right eye: Secondary | ICD-10-CM | POA: Diagnosis not present

## 2020-03-09 DIAGNOSIS — H25811 Combined forms of age-related cataract, right eye: Secondary | ICD-10-CM | POA: Diagnosis not present

## 2020-03-16 DIAGNOSIS — H2511 Age-related nuclear cataract, right eye: Secondary | ICD-10-CM | POA: Diagnosis not present

## 2020-03-31 DIAGNOSIS — H25012 Cortical age-related cataract, left eye: Secondary | ICD-10-CM | POA: Diagnosis not present

## 2020-03-31 DIAGNOSIS — H2512 Age-related nuclear cataract, left eye: Secondary | ICD-10-CM | POA: Diagnosis not present

## 2020-04-06 DIAGNOSIS — H25812 Combined forms of age-related cataract, left eye: Secondary | ICD-10-CM | POA: Diagnosis not present

## 2020-04-06 DIAGNOSIS — H25012 Cortical age-related cataract, left eye: Secondary | ICD-10-CM | POA: Diagnosis not present

## 2020-04-06 DIAGNOSIS — H2512 Age-related nuclear cataract, left eye: Secondary | ICD-10-CM | POA: Diagnosis not present

## 2020-04-14 DIAGNOSIS — H2512 Age-related nuclear cataract, left eye: Secondary | ICD-10-CM | POA: Diagnosis not present

## 2020-05-24 ENCOUNTER — Ambulatory Visit (INDEPENDENT_AMBULATORY_CARE_PROVIDER_SITE_OTHER): Payer: PPO | Admitting: Nurse Practitioner

## 2020-05-24 ENCOUNTER — Encounter: Payer: Self-pay | Admitting: Nurse Practitioner

## 2020-05-24 DIAGNOSIS — J0110 Acute frontal sinusitis, unspecified: Secondary | ICD-10-CM | POA: Insufficient documentation

## 2020-05-24 MED ORDER — FLUTICASONE PROPIONATE 50 MCG/ACT NA SUSP: 2.0000 | Freq: Every day | NASAL | 6 refills | Status: DC

## 2020-05-24 MED ORDER — AMOXICILLIN-POT CLAVULANATE 875-125 MG PO TABS
1.0000 | ORAL_TABLET | Freq: Two times a day (BID) | ORAL | 0 refills | Status: DC
Start: 2020-05-24 — End: 2020-07-16

## 2020-05-24 MED ORDER — DM-GUAIFENESIN ER 30-600 MG PO TB12: 1.0000 | ORAL_TABLET | Freq: Two times a day (BID) | ORAL | 0 refills | Status: DC

## 2020-05-24 NOTE — Assessment & Plan Note (Signed)
Subacute frontal sinusitis symptoms not well controlled.  Patient reports headache, cough, mucus, fever and chills, congestion in the last 7 days. Advised patient to increase hydration, Augmentin 875-125 mg tablet by mouth for 7 days, guaifenesin for cough and congestion, and Flonase.   Education provided to patient with printed handouts given.  Rx sent to pharmacy.

## 2020-05-24 NOTE — Patient Instructions (Signed)

## 2020-05-24 NOTE — Progress Notes (Signed)
   Virtual Visit via telephone Note Due to COVID-19 pandemic this visit was conducted virtually. This visit type was conducted due to national recommendations for restrictions regarding the COVID-19 Pandemic (e.g. social distancing, sheltering in place) in an effort to limit this patient's exposure and mitigate transmission in our community. All issues noted in this document were discussed and addressed.  A physical exam was not performed with this format.  I connected with Laura Dalton on 05/24/20 at 11:30 am by telephone and verified that I am speaking with the correct person using two identifiers. Laura Dalton is currently located at home during visit. The provider, Daryll Drown, NP is located in their office at time of visit.  I discussed the limitations, risks, security and privacy concerns of performing an evaluation and management service by telephone and the availability of in person appointments. I also discussed with the patient that there may be a patient responsible charge related to this service. The patient expressed understanding and agreed to proceed.   History and Present Illness:  Sinusitis This is a new problem. The current episode started in the past 7 days. The problem has been gradually worsening since onset. The maximum temperature recorded prior to her arrival was 100.4 - 100.9 F. The fever has been present for less than 1 day. The pain is moderate. Associated symptoms include chills, congestion, coughing and headaches. Past treatments include spray decongestants.      Review of Systems  Constitutional: Positive for chills.  HENT: Positive for congestion.   Respiratory: Positive for cough.   Musculoskeletal: Negative.   Neurological: Positive for headaches.  All other systems reviewed and are negative.    Observations/Objective: Televisit-patient did not show signs of distress  Assessment and Plan:  Subacute frontal sinusitis Subacute frontal  sinusitis symptoms not well controlled.  Patient reports headache, cough, mucus, fever and chills, congestion in the last 7 days. Advised patient to increase hydration, Augmentin 875-125 mg tablet by mouth for 7 days, guaifenesin for cough and congestion, and Flonase.   Education provided to patient with printed handouts given.  Rx sent to pharmacy.  Follow Up Instructions:  Follow-up with worsening unresolved symptoms.    I discussed the assessment and treatment plan with the patient. The patient was provided an opportunity to ask questions and all were answered. The patient agreed with the plan and demonstrated an understanding of the instructions.   The patient was advised to call back or seek an in-person evaluation if the symptoms worsen or if the condition fails to improve as anticipated.  The above assessment and management plan was discussed with the patient. The patient verbalized understanding of and has agreed to the management plan. Patient is aware to call the clinic if symptoms persist or worsen. Patient is aware when to return to the clinic for a follow-up visit. Patient educated on when it is appropriate to go to the emergency department.   Time call ended:  9  I provided 11:38 AM.  Minutes of non-face-to-face time during this encounter.    Daryll Drown, NP

## 2020-07-15 ENCOUNTER — Other Ambulatory Visit: Payer: Self-pay | Admitting: Family Medicine

## 2020-07-16 ENCOUNTER — Other Ambulatory Visit: Payer: Self-pay

## 2020-07-16 ENCOUNTER — Ambulatory Visit (INDEPENDENT_AMBULATORY_CARE_PROVIDER_SITE_OTHER): Payer: PPO | Admitting: Nurse Practitioner

## 2020-07-16 ENCOUNTER — Encounter: Payer: Self-pay | Admitting: Nurse Practitioner

## 2020-07-16 VITALS — BP 130/76 | HR 87 | Temp 98.1°F | Ht 66.0 in | Wt 180.0 lb

## 2020-07-16 DIAGNOSIS — M549 Dorsalgia, unspecified: Secondary | ICD-10-CM

## 2020-07-16 DIAGNOSIS — R300 Dysuria: Secondary | ICD-10-CM | POA: Insufficient documentation

## 2020-07-16 DIAGNOSIS — G8929 Other chronic pain: Secondary | ICD-10-CM | POA: Diagnosis not present

## 2020-07-16 LAB — URINALYSIS, ROUTINE W REFLEX MICROSCOPIC
Bilirubin, UA: NEGATIVE
Glucose, UA: NEGATIVE
Ketones, UA: NEGATIVE
Nitrite, UA: NEGATIVE
Protein,UA: NEGATIVE
Specific Gravity, UA: 1.01 (ref 1.005–1.030)
Urobilinogen, Ur: 0.2 mg/dL (ref 0.2–1.0)
pH, UA: 7 (ref 5.0–7.5)

## 2020-07-16 LAB — MICROSCOPIC EXAMINATION

## 2020-07-16 MED ORDER — SULFAMETHOXAZOLE-TRIMETHOPRIM 800-160 MG PO TABS: 1.0000 | ORAL_TABLET | Freq: Two times a day (BID) | ORAL | 0 refills | Status: DC

## 2020-07-16 NOTE — Assessment & Plan Note (Signed)
Dysuria symptoms not well controlled.  Patient is reporting urinary frequency, urine odor.  Advised patient to increase hydration, Bactrim DS order to pharmacy.  Completed urinalysis, results showing few bacteria leukocytes present, and trace blood.  Cultures ordered results pending  Rx sent to pharmacy, education provided to patient with printed handouts given.  Follow-up with worsening unresolved symptoms.

## 2020-07-16 NOTE — Assessment & Plan Note (Signed)
Continue lidocaine 5% 3 times daily as needed

## 2020-07-16 NOTE — Patient Instructions (Signed)

## 2020-07-16 NOTE — Progress Notes (Signed)
Acute Office Visit  Subjective:    Patient ID: Laura Dalton, female    DOB: 1951-03-02, 70 y.o.   MRN: [id number]  Chief Complaint  Patient presents with  . urine odor  . Urinary Frequency    Urinary Frequency  This is a new problem. The current episode started in the past 7 days. The problem has been gradually worsening. The quality of the pain is described as burning. The pain is moderate. There has been no fever. Associated symptoms include frequency and urgency. Pertinent negatives include no flank pain, nausea or vomiting. She has tried nothing for the symptoms.     Past Medical History:  Diagnosis Date  . Bursitis of hip   . Diabetes mellitus without complication (HCC)    type II  . Hyperlipidemia   . Hypertension   . Hypothyroidism   . Low back pain    benign turmor back  . Stroke (HCC)   . Vitamin D deficiency disease     Past Surgical History:  Procedure Laterality Date  . APPENDECTOMY    . BACK SURGERY  1996   turmor on back   . CHOLECYSTECTOMY    . LEG SURGERY Right    "clot and had drain in leg  rt lower leg    Family History  Problem Relation Age of Onset  . Diabetes Mother   . Stroke Mother   . Seizures Mother   . Cancer Father        prostate  . Thyroid disease Son   . Hashimoto's thyroiditis Son   . COPD Sister   . Cancer Brother        lung  . COPD Sister   . COPD Sister   . Cancer Brother        prostate  . COPD Brother   . COPD Brother     Social History   Socioeconomic History  . Marital status: Widowed    Spouse name: Not on file  . Number of children: 3  . Years of education: 35  . Highest education level: Associate degree: academic program  Occupational History  . 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 Smoking status: Former Smoker    Packs/day: 0.50    Years: 45.00    Pack years: 22.50    Types: Cigarettes    Start date: 08/15/1962    Quit date: 12/01/2007    Years since quitting: 12.6  .  Smokeless tobacco: Never Used  Vaping Use  . Vaping Use: Never used  Substance and Sexual Activity  . Alcohol use: No    Alcohol/week: 0.0 standard drinks  . Drug use: No  . Sexual activity: Not Currently  Other Topics Concern  . Not on file  Social History Narrative  . Not on file   Social Determinants of Health   Financial Resource Strain: Not on file  Food Insecurity: Not on file  Transportation Needs: Not on file  Physical Activity: Not on file  Stress: Not on file  Social Connections: Not on file  Intimate Partner Violence: Not on file    Outpatient Medications Prior to Visit  Medication Sig Dispense Refill  . amitriptyline (ELAVIL) 100 MG tablet Take 1 tablet (100 mg total) by mouth at bedtime. 90 tablet 3  . atorvastatin (LIPITOR) 40 MG tablet Take 1 tablet (40 mg total) by mouth daily. 90 tablet 3  . baclofen (LIORESAL) 10 MG tablet Take 1 tablet (10 mg  total) by mouth 3 (three) times daily as needed for muscle spasms. 60 each 2  . calcium carbonate (OSCAL) 1500 (600 Ca) MG TABS tablet Take by mouth 2 (two) times daily with a meal.    . clopidogrel (PLAVIX) 75 MG tablet Take 1 tablet (75 mg total) by mouth daily. 90 tablet 3  . dextromethorphan-guaiFENesin (MUCINEX DM) 30-600 MG 12hr tablet Take 1 tablet by mouth 2 (two) times daily. 30 tablet 0  . EUTHYROX 88 MCG tablet TAKE 1 TABLET BY MOUTH ONCE DAILY BEFORE BREAKFAST 90 tablet 3  . fluticasone (FLONASE) 50 MCG/ACT nasal spray Place 2 sprays into both nostrils daily. 16 g 6  . lisinopril (ZESTRIL) 40 MG tablet Take 1 tablet by mouth once daily 90 tablet 3  . triamcinolone cream (KENALOG) 0.1 % Apply 1 application topically 2 (two) times daily. 80 g 0  . vitamin C (ASCORBIC ACID) 500 MG tablet Take 500 mg by mouth daily.    Marland Kitchen amoxicillin-clavulanate (AUGMENTIN) 875-125 MG tablet Take 1 tablet by mouth 2 (two) times daily. 14 tablet 0  . lidocaine (XYLOCAINE) 5 % ointment Apply 1 application topically 3 (three) times  daily as needed. 35.44 g 0  . metFORMIN (GLUCOPHAGE) 500 MG tablet Take 1 tablet (500 mg total) by mouth daily. 90 tablet 3   No facility-administered medications prior to visit.    Allergies  Allergen Reactions  . Aspirin Hives  . Gabapentin Other (See Comments)    Dizzy,falls    Review of Systems  HENT: Negative.   Gastrointestinal: Negative for nausea and vomiting.  Genitourinary: Positive for frequency and urgency. Negative for flank pain.  Musculoskeletal: Positive for arthralgias and back pain.  All other systems reviewed and are negative.      Objective:    Physical Exam Vitals and nursing note reviewed.  Constitutional:      Appearance: Normal appearance.  HENT:     Head: Normocephalic.     Nose: Nose normal.  Eyes:     Conjunctiva/sclera: Conjunctivae normal.  Cardiovascular:     Rate and Rhythm: Normal rate and regular rhythm.     Pulses: Normal pulses.     Heart sounds: Normal heart sounds.  Pulmonary:     Effort: Pulmonary effort is normal.     Breath sounds: Normal breath sounds.  Abdominal:     Tenderness: There is no right CVA tenderness or left CVA tenderness.  Musculoskeletal:     Lumbar back: Tenderness present. Decreased range of motion.  Skin:    General: Skin is warm.     Findings: No rash.  Neurological:     Mental Status: She is alert and oriented to person, place, and time.     BP 130/76   Pulse 87   Temp 98.1 F (36.7 C) (Temporal)   Ht 5\' 6"  (1.676 m)   Wt 180 lb (81.6 kg)   SpO2 95%   BMI 29.05 kg/m  Wt Readings from Last 3 Encounters:  07/16/20 180 lb (81.6 kg)  12/31/19 177 lb (80.3 kg)  09/26/19 185 lb (83.9 kg)    Health Maintenance Due  Topic Date Due  . TETANUS/TDAP  03/13/2020  . HEMOGLOBIN A1C  06/30/2020    There are no preventive care reminders to display for this patient.   Lab Results  Component Value Date   TSH 2.440 12/31/2019   Lab Results  Component Value Date   WBC 7.9 12/31/2019   HGB 12.6  12/31/2019   HCT 38.0  12/31/2019   MCV 82 12/31/2019   PLT 284 12/31/2019   Lab Results  Component Value Date   NA 142 12/31/2019   K 4.2 12/31/2019   CO2 26 12/31/2019   GLUCOSE 105 (H) 12/31/2019   BUN 6 (L) 12/31/2019   CREATININE 0.89 12/31/2019   BILITOT 0.4 12/31/2019   ALKPHOS 92 12/31/2019   AST 13 12/31/2019   ALT 7 12/31/2019   PROT 6.8 12/31/2019   ALBUMIN 4.7 12/31/2019   CALCIUM 9.5 12/31/2019   ANIONGAP 10 06/20/2018   Lab Results  Component Value Date   CHOL 136 12/31/2019   Lab Results  Component Value Date   HDL 40 12/31/2019   Lab Results  Component Value Date   LDLCALC 76 12/31/2019   Lab Results  Component Value Date   TRIG 109 12/31/2019   Lab Results  Component Value Date   CHOLHDL 3.4 12/31/2019   Lab Results  Component Value Date   HGBA1C 5.8 12/31/2019       Assessment & Plan:   Problem List Items Addressed This Visit      Other   Chronic back pain    Continue lidocaine 5% 3 times daily as needed      Dysuria - Primary    Dysuria symptoms not well controlled.  Patient is reporting urinary frequency, urine odor.  Advised patient to increase hydration, Bactrim DS order to pharmacy.  Completed urinalysis, results showing few bacteria leukocytes present, and trace blood.  Cultures ordered results pending  Rx sent to pharmacy, education provided to patient with printed handouts given.  Follow-up with worsening unresolved symptoms.      Relevant Orders   Urinalysis, Routine w reflex microscopic   Urine Culture       Meds ordered this encounter  Medications  . sulfamethoxazole-trimethoprim (BACTRIM DS) 800-160 MG tablet    Sig: Take 1 tablet by mouth 2 (two) times daily.    Dispense:  10 tablet    Refill:  0    Order Specific Question:   Supervising Provider    Answer:   Raliegh Ip [[id number]]     Daryll Drown, NP

## 2020-07-20 ENCOUNTER — Other Ambulatory Visit: Payer: Self-pay | Admitting: Nurse Practitioner

## 2020-07-20 LAB — URINE CULTURE

## 2020-07-20 MED ORDER — AMOXICILLIN 500 MG PO CAPS: 500.0000 mg | ORAL_CAPSULE | Freq: Three times a day (TID) | ORAL | 0 refills | Status: AC

## 2020-07-21 ENCOUNTER — Other Ambulatory Visit: Payer: Self-pay | Admitting: *Deleted

## 2020-07-21 MED ORDER — BACLOFEN 10 MG PO TABS: 10.0000 mg | ORAL_TABLET | Freq: Three times a day (TID) | ORAL | 2 refills | Status: DC | PRN

## 2020-07-23 ENCOUNTER — Encounter (INDEPENDENT_AMBULATORY_CARE_PROVIDER_SITE_OTHER): Payer: Self-pay

## 2020-08-22 ENCOUNTER — Other Ambulatory Visit: Payer: Self-pay | Admitting: Family Medicine

## 2020-08-22 DIAGNOSIS — E039 Hypothyroidism, unspecified: Secondary | ICD-10-CM

## 2020-08-30 DIAGNOSIS — H353132 Nonexudative age-related macular degeneration, bilateral, intermediate dry stage: Secondary | ICD-10-CM | POA: Diagnosis not present

## 2020-10-05 DIAGNOSIS — H353122 Nonexudative age-related macular degeneration, left eye, intermediate dry stage: Secondary | ICD-10-CM | POA: Diagnosis not present

## 2020-10-09 DIAGNOSIS — U071 COVID-19: Secondary | ICD-10-CM | POA: Diagnosis not present

## 2020-10-12 ENCOUNTER — Encounter: Payer: Self-pay | Admitting: Family Medicine

## 2020-10-18 ENCOUNTER — Encounter: Payer: Self-pay | Admitting: Family Medicine

## 2020-11-02 DIAGNOSIS — Z6827 Body mass index (BMI) 27.0-27.9, adult: Secondary | ICD-10-CM | POA: Diagnosis not present

## 2020-11-02 DIAGNOSIS — R269 Unspecified abnormalities of gait and mobility: Secondary | ICD-10-CM | POA: Diagnosis not present

## 2020-11-02 DIAGNOSIS — G4700 Insomnia, unspecified: Secondary | ICD-10-CM | POA: Diagnosis not present

## 2020-11-02 DIAGNOSIS — E663 Overweight: Secondary | ICD-10-CM | POA: Diagnosis not present

## 2020-11-02 DIAGNOSIS — R69 Illness, unspecified: Secondary | ICD-10-CM | POA: Diagnosis not present

## 2020-11-02 DIAGNOSIS — G8929 Other chronic pain: Secondary | ICD-10-CM | POA: Diagnosis not present

## 2020-11-02 DIAGNOSIS — M1990 Unspecified osteoarthritis, unspecified site: Secondary | ICD-10-CM | POA: Diagnosis not present

## 2020-11-02 DIAGNOSIS — E119 Type 2 diabetes mellitus without complications: Secondary | ICD-10-CM | POA: Diagnosis not present

## 2020-11-02 DIAGNOSIS — I10 Essential (primary) hypertension: Secondary | ICD-10-CM | POA: Diagnosis not present

## 2020-11-02 DIAGNOSIS — E785 Hyperlipidemia, unspecified: Secondary | ICD-10-CM | POA: Diagnosis not present

## 2020-11-16 ENCOUNTER — Ambulatory Visit (INDEPENDENT_AMBULATORY_CARE_PROVIDER_SITE_OTHER): Payer: Medicare HMO | Admitting: Nurse Practitioner

## 2020-11-16 ENCOUNTER — Ambulatory Visit (INDEPENDENT_AMBULATORY_CARE_PROVIDER_SITE_OTHER): Payer: Medicare HMO

## 2020-11-16 ENCOUNTER — Encounter: Payer: Self-pay | Admitting: Nurse Practitioner

## 2020-11-16 DIAGNOSIS — R300 Dysuria: Secondary | ICD-10-CM | POA: Diagnosis not present

## 2020-11-16 DIAGNOSIS — Z0001 Encounter for general adult medical examination with abnormal findings: Secondary | ICD-10-CM

## 2020-11-16 DIAGNOSIS — Z0000 Encounter for general adult medical examination without abnormal findings: Secondary | ICD-10-CM

## 2020-11-16 DIAGNOSIS — M545 Low back pain, unspecified: Secondary | ICD-10-CM

## 2020-11-16 LAB — URINALYSIS
Bilirubin, UA: NEGATIVE
Glucose, UA: NEGATIVE
Ketones, UA: NEGATIVE
Nitrite, UA: NEGATIVE
Protein,UA: NEGATIVE
RBC, UA: NEGATIVE
Specific Gravity, UA: <1.005 — ABNORMAL LOW (ref 1.005–1.030)
Urobilinogen, Ur: 0.2 mg/dL (ref 0.2–1.0)
pH, UA: 5.5 (ref 5.0–7.5)

## 2020-11-16 MED ORDER — CEPHALEXIN 500 MG PO CAPS
500.0000 mg | ORAL_CAPSULE | Freq: Two times a day (BID) | ORAL | 0 refills | Status: DC
Start: 2020-11-16 — End: 2020-12-06

## 2020-11-16 NOTE — Progress Notes (Signed)
   Virtual Visit  Note Due to COVID-19 pandemic this visit was conducted virtually. This visit type was conducted due to national recommendations for restrictions regarding the COVID-19 Pandemic (e.g. social distancing, sheltering in place) in an effort to limit this patient's exposure and mitigate transmission in our community. All issues noted in this document were discussed and addressed.  A physical exam was not performed with this format.  I connected with Laura Dalton on 11/16/20 at 9:30 AM by telephone and verified that I am speaking with the correct person using two identifiers. Laura Dalton is currently located at home during visit. The provider, Daryll Drown, NP is located in their office at time of visit.  I discussed the limitations, risks, security and privacy concerns of performing an evaluation and management service by telephone and the availability of in person appointments. I also discussed with the patient that there may be a patient responsible charge related to this service. The patient expressed understanding and agreed to proceed.   History and Present Illness:  Dysuria  This is a new problem. Episode onset: 3 to 4 days. The problem occurs intermittently. The problem has been unchanged. The patient is experiencing no pain. There has been no fever. There is No history of pyelonephritis. Associated symptoms include frequency and urgency. Pertinent negatives include no chills, discharge or flank pain. She has tried nothing for the symptoms. Her past medical history is significant for recurrent UTIs.     Review of Systems  Constitutional:  Negative for chills.  HENT: Negative.    Respiratory: Negative.    Genitourinary:  Positive for dysuria, frequency and urgency. Negative for flank pain.  Skin:  Negative for rash.    Observations/Objective: Televisit patient not in distress.   Assessment and Plan: Increase hydration, Tylenol for any type of pain,  urinalysis completed results pending.  Follow Up Instructions: Follow-up with worsening unresolved symptoms.    I discussed the assessment and treatment plan with the patient. The patient was provided an opportunity to ask questions and all were answered. The patient agreed with the plan and demonstrated an understanding of the instructions.   The patient was advised to call back or seek an in-person evaluation if the symptoms worsen or if the condition fails to improve as anticipated.  The above assessment and management plan was discussed with the patient. The patient verbalized understanding of and has agreed to the management plan. Patient is aware to call the clinic if symptoms persist or worsen. Patient is aware when to return to the clinic for a follow-up visit. Patient educated on when it is appropriate to go to the emergency department.   Time call ended: 9:40 AM  I provided 10 minutes of  non face-to-face time during this encounter.    Daryll Drown, NP

## 2020-11-16 NOTE — Progress Notes (Addendum)
MEDICARE ANNUAL WELLNESS VISIT  11/16/2020  Telephone Visit Disclaimer This Medicare AWV was conducted by telephone due to national recommendations for restrictions regarding the COVID-19 Pandemic (e.g. social distancing).  I verified, using two identifiers, that I am speaking with Laura Dalton or their authorized healthcare agent. I discussed the limitations, risks, security, and privacy concerns of performing an evaluation and management service by telephone and the potential availability of an in-person appointment in the future. The patient expressed understanding and agreed to proceed.  Location of Patient: Home Location of Provider (nurse):  [facility]  Subjective:    Laura Dalton is a 70 y.o. female patient of Dettinger, Elige Radon, MD who had a Medicare Annual Wellness Visit today via telephone. Manika is Retired and lives alone. she has three children. she reports that she is socially active and does interact with friends/family regularly. she is not physically active and enjoys sewing, painting, woodworking.  Patient Care Team: Dettinger, Elige Radon, MD as PCP - General (Family Medicine) Wyline Mood, Dorothe Pea, MD as PCP - Cardiology (Cardiology) Donnetta Hail, MD as Consulting Physician (Rheumatology) Ranee Gosselin, MD as Consulting Physician (Orthopedic Surgery) Valetta Fuller, Brand Males, NP (Inactive) as Nurse Practitioner (Internal Medicine) Gwenith Daily, RN as Registered Nurse  Advanced Directives 11/16/2020 12/16/2018 06/20/2018 09/03/2017 08/31/2016 08/01/2015 04/15/2015  Does Patient Have a Medical Advance Directive? No No No Yes No Yes Yes  Type of Advance Directive - - Web designer;Living will - Living will Living will  Does patient want to make changes to medical advance directive? - - - No - Patient declined - - No - Patient declined  Copy of Healthcare Power of Attorney in Chart? - - - No - copy requested - No - copy requested No -  copy requested  Would patient like information on creating a medical advance directive? No - Patient declined No - Patient declined - - Yes (MAU/Ambulatory/Procedural Areas - Information given) - No - patient declined information    Hospital Utilization Over the Past 12 Months: # of hospitalizations or ER visits: 0 # of surgeries: 0  Review of Systems    Patient reports that her overall health is unchanged compared to last year.  Patient Reported Readings (BP, Pulse, CBG, Weight, etc) none  Pain Assessment Pain : 0-10 Pain Score: 8  Pain Type: Acute pain Pain Location: Back Pain Descriptors / Indicators: Discomfort Pain Onset: Yesterday Effect of Pain on Daily Activities: Fatigue     Current Medications & Allergies (verified) Allergies as of 11/16/2020       Reactions   Aspirin Hives   Gabapentin Other (See Comments)   Dizzy,falls        Medication List        Accurate as of November 16, 2020  9:55 AM. If you have any questions, ask your nurse or doctor.          amitriptyline 100 MG tablet Commonly known as: ELAVIL Take 1 tablet (100 mg total) by mouth at bedtime.   atorvastatin 40 MG tablet Commonly known as: LIPITOR Take 1 tablet (40 mg total) by mouth daily.   baclofen 10 MG tablet Commonly known as: LIORESAL Take 1 tablet (10 mg total) by mouth 3 (three) times daily as needed for muscle spasms.   calcium carbonate 1500 (600 Ca) MG Tabs tablet Commonly known as: OSCAL Take by mouth 2 (two) times daily with a meal.   clopidogrel 75 MG tablet  Commonly known as: PLAVIX Take 1 tablet (75 mg total) by mouth daily.   dextromethorphan-guaiFENesin 30-600 MG 12hr tablet Commonly known as: MUCINEX DM Take 1 tablet by mouth 2 (two) times daily.   Euthyrox 88 MCG tablet Generic drug: levothyroxine TAKE 1 TABLET BY MOUTH ONCE DAILY BEFORE BREAKFAST   fluticasone 50 MCG/ACT nasal spray Commonly known as: FLONASE Place 2 sprays into both nostrils  daily.   lisinopril 40 MG tablet Commonly known as: ZESTRIL Take 1 tablet by mouth once daily   sulfamethoxazole-trimethoprim 800-160 MG tablet Commonly known as: Bactrim DS Take 1 tablet by mouth 2 (two) times daily.   triamcinolone cream 0.1 % Commonly known as: KENALOG Apply 1 application topically 2 (two) times daily.   vitamin C 500 MG tablet Commonly known as: ASCORBIC ACID Take 500 mg by mouth daily.        History (reviewed): Past Medical History:  Diagnosis Date   Bursitis of hip    Diabetes mellitus without complication (HCC)    type II   Hyperlipidemia    Hypertension    Hypothyroidism    Low back pain    benign turmor back   Stroke (HCC)    Vitamin D deficiency disease    Past Surgical History:  Procedure Laterality Date   APPENDECTOMY     BACK SURGERY  1996   turmor on back    CHOLECYSTECTOMY     LEG SURGERY Right    "clot and had drain in leg  rt lower leg   Family History  Problem Relation Age of Onset   Diabetes Mother    Stroke Mother    Seizures Mother    Cancer Father        prostate   Thyroid disease Son    Hashimoto's thyroiditis Son    COPD Sister    Cancer Brother        lung   COPD Sister    COPD Sister    Cancer Brother        prostate   COPD Brother    COPD Brother    Social History   Socioeconomic History   Marital status: Widowed    Spouse name: Not on file   Number of children: 3   Years of education: 14   Highest education level: Associate degree: academic program  Occupational History   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  Tobacco Use   Smoking status: Former    Packs/day: 0.50    Years: 45.00    Pack years: 22.50    Types: Cigarettes    Start date: 08/15/1962    Quit date: 12/01/2007    Years since quitting: 12.9   Smokeless tobacco: Never  Vaping Use   Vaping Use: Never used  Substance and Sexual Activity   Alcohol use: No    Alcohol/week: 0.0 standard drinks   Drug use: No   Sexual  activity: Not Currently  Other Topics Concern   Not on file  Social History Narrative   Not on file   Social Determinants of Health   Financial Resource Strain: Not on file  Food Insecurity: Not on file  Transportation Needs: Not on file  Physical Activity: Not on file  Stress: Not on file  Social Connections: Not on file    Activities of Daily Living In your present state of health, do you have any difficulty performing the following activities: 11/16/2020  Hearing? N  Vision? N  Difficulty concentrating or making  decisions? N  Walking or climbing stairs? Y  Dressing or bathing? N  Doing errands, shopping? N  Preparing Food and eating ? N  Using the Toilet? N  In the past six months, have you accidently leaked urine? Y  Do you have problems with loss of bowel control? Y  Managing your Medications? N  Managing your Finances? N  Housekeeping or managing your Housekeeping? N  Some recent data might be hidden    Patient Education/ Literacy How often do you need to have someone help you when you read instructions, pamphlets, or other written materials from your doctor or pharmacy?: 1 - Never What is the last grade level you completed in school?: Associate degree  Exercise Current Exercise Habits: The patient does not participate in regular exercise at present, Exercise limited by: orthopedic condition(s)  Diet Patient reports consuming 2 meals a day and 3 snack(s) a day Patient reports that her primary diet is: Regular Patient reports that she does have regular access to food.   Depression Screen PHQ 2/9 Scores 09/26/2019 06/27/2019 12/16/2018 12/12/2018 11/07/2017 10/23/2017 09/03/2017  PHQ - 2 Score 0 0 0 0 0 0 0     Fall Risk Fall Risk  11/16/2020 12/31/2019 09/26/2019 06/30/2019 06/27/2019  Falls in the past year? 1 1 1 1 1   Number falls in past yr: 1 0 1 1 1   Injury with Fall? 0 0 1 0 0  Risk for fall due to : Impaired balance/gait Medication side effect Impaired  balance/gait Impaired balance/gait Impaired balance/gait  Risk for fall due to: Comment - - - - -  Follow up Falls evaluation completed Falls evaluation completed Falls evaluation completed Falls evaluation completed;Falls prevention discussed Falls evaluation completed  Comment - - - - -     Objective:  Laura AlarVerna Dianne Marhefka seemed alert and oriented and she participated appropriately during our telephone visit.  Blood Pressure Weight BMI  BP Readings from Last 3 Encounters:  07/16/20 130/76  12/31/19 (!) 142/69  09/26/19 121/65   Wt Readings from Last 3 Encounters:  07/16/20 180 lb (81.6 kg)  12/31/19 177 lb (80.3 kg)  09/26/19 185 lb (83.9 kg)   BMI Readings from Last 1 Encounters:  07/16/20 29.05 kg/m    *Unable to obtain current vital signs, weight, and BMI due to telephone visit type  Hearing/Vision  Scheryl MartenVerna did not seem to have difficulty with hearing/understanding during the telephone conversation Reports that she has not had a formal eye exam by an eye care professional within the past year Reports that she has not had a formal hearing evaluation within the past year *Unable to fully assess hearing and vision during telephone visit type  Cognitive Function: 6CIT Screen 11/16/2020 12/16/2018  What Year? 0 points 0 points  What month? 0 points 0 points  What time? 0 points 0 points  Count back from 20 0 points 0 points  Months in reverse 0 points 2 points  Repeat phrase 4 points 2 points  Total Score 4 4   (Normal:0-7, Significant for Dysfunction: >8)  Normal Cognitive Function Screening: Yes   Immunization & Health Maintenance Record Immunization History  Administered Date(s) Administered   Influenza Whole 03/13/2010   Tdap 03/13/2010    Health Maintenance  Topic Date Due   Zoster Vaccines- Shingrix (1 of 2) Never done   COLON CANCER SCREENING ANNUAL FOBT  11/08/2018   TETANUS/TDAP  03/13/2020   HEMOGLOBIN A1C  06/30/2020   INFLUENZA VACCINE  10/11/2020  COLONOSCOPY (Pts 45-60yrs Insurance coverage will need to be confirmed)  12/30/2020 (Originally 08/15/1994)   PNA vac Low Risk Adult (1 of 2 - PCV13) 12/30/2020 (Originally 08/15/2014)   FOOT EXAM  12/30/2020   OPHTHALMOLOGY EXAM  02/22/2021   MAMMOGRAM  05/21/2021   DEXA SCAN  Completed   Hepatitis C Screening  Completed   HPV VACCINES  Aged Out   COVID-19 Vaccine  Discontinued       Assessment  This is a routine wellness examination for [employer].  Health Maintenance: Due or Overdue Health Maintenance Due  Topic Date Due   Zoster Vaccines- Shingrix (1 of 2) Never done   COLON CANCER SCREENING ANNUAL FOBT  11/08/2018   TETANUS/TDAP  03/13/2020   HEMOGLOBIN A1C  06/30/2020   INFLUENZA VACCINE  10/11/2020    Laura Dalton does not need a referral for Community Assistance: Care Management:   no Social Work:    no Prescription Assistance:  no Nutrition/Diabetes Education:  no   Plan:  Personalized Goals  Goals Addressed             This Visit's Progress    DIET - INCREASE WATER INTAKE   Not on track    Try to drink 6-8 glasses of water daily.     Exercise 3x per week (30 min per time)   Not on track    Have 3 meals a day   No change    Meal Planning   On track    CARE PLAN ENTRY (see longitudinal plan of care for additional care plan information)  Current Barriers:  Knowledge Deficits related to meal planning for 1  Nurse Case Manager Clinical Goal(s):  Over the next 30 days, patient will work with RN Care Manager to address needs related to meal planning  Interventions:  Inter-disciplinary care team collaboration (see longitudinal plan of care) Chart reviewed Previously discussed current eating habits Doesn't plan meals. Mainly eats junk food and sandwiches Likes peanut butter No nutritional supplements Daughter-in-law will bring a fixed plate for her a couple times a week Eats alone Prepared meals in the past for her now deceased husband and her  grandson that no longer lives with her Encouraged routine meals with lean protein, frutis/vegetables States that she felt good this morning and ate a biscuit with country ham and tomato Recommended nutritional supplement like Boost Previously prepared handout on meal planning and mailed to patient Suggested she talk with LCSW regarding psychosocial concerns  Patient Self Care Activities:  Performs ADL's independently Performs IADL's independently   Please see past updates related to this goal by clicking on the "Past Updates" button in the selected goal         Personalized Health Maintenance & Screening Recommendations  Td vaccine, shingrix, flu  Lung Cancer Screening Recommended: no (Low Dose CT Chest recommended if Age 30-80 years, 30 pack-year currently smoking OR have quit w/in past 15 years) Hepatitis C Screening recommended: no HIV Screening recommended: no  Advanced Directives: Written information was not prepared per patient's request.  Referrals & Orders Orders Placed This Encounter  Procedures   Urine Culture   Urinalysis    Follow-up Plan Follow-up with Dettinger, Elige Radon, MD as planned Schedule 12/06/20   I have personally reviewed and noted the following in the patient's chart:   Medical and social history Use of alcohol, tobacco or illicit drugs  Current medications and supplements Functional ability and status Nutritional status Physical activity Advanced directives List  of other physicians Hospitalizations, surgeries, and ER visits in previous 12 months Vitals Screenings to include cognitive, depression, and falls Referrals and appointments  In addition, I have reviewed and discussed with Laura Dalton certain preventive protocols, quality metrics, and best practice recommendations. A written personalized care plan for preventive services as well as general preventive health recommendations is available and can be mailed to the patient at her  request.      AIRABELLA BARLEY, LPN  04/21/7679  I have reviewed and agree with the above  documentation.   Jannifer Rodney, FNP

## 2020-11-16 NOTE — Patient Instructions (Signed)
MEDICARE ANNUAL WELLNESS VISIT Health Maintenance Summary and Written Plan of Care  Ms. Warmack ,  Thank you for allowing me to perform your Medicare Annual Wellness Visit and for your ongoing commitment to your health.   Health Maintenance & Immunization History Health Maintenance  Topic Date Due   Zoster Vaccines- Shingrix (1 of 2) Never done   COLON CANCER SCREENING ANNUAL FOBT  11/08/2018   TETANUS/TDAP  03/13/2020   HEMOGLOBIN A1C  06/30/2020   INFLUENZA VACCINE  10/11/2020   COLONOSCOPY (Pts 45-22yrs Insurance coverage will need to be confirmed)  12/30/2020 (Originally 08/15/1994)   PNA vac Low Risk Adult (1 of 2 - PCV13) 12/30/2020 (Originally 08/15/2014)   FOOT EXAM  12/30/2020   OPHTHALMOLOGY EXAM  02/22/2021   MAMMOGRAM  05/21/2021   DEXA SCAN  Completed   Hepatitis C Screening  Completed   HPV VACCINES  Aged Out   COVID-19 Vaccine  Discontinued   Immunization History  Administered Date(s) Administered   Influenza Whole 03/13/2010   Tdap 03/13/2010    These are the patient goals that we discussed:  Goals Addressed             This Visit's Progress    DIET - INCREASE WATER INTAKE   Not on track    Try to drink 6-8 glasses of water daily.     Exercise 3x per week (30 min per time)   Not on track    Have 3 meals a day   No change    Meal Planning   On track    CARE PLAN ENTRY (see longitudinal plan of care for additional care plan information)  Current Barriers:  Knowledge Deficits related to meal planning for 1  Nurse Case Manager Clinical Goal(s):  Over the next 30 days, patient will work with RN Care Manager to address needs related to meal planning  Interventions:  Inter-disciplinary care team collaboration (see longitudinal plan of care) Chart reviewed Previously discussed current eating habits Doesn't plan meals. Mainly eats junk food and sandwiches Likes peanut butter No nutritional supplements Daughter-in-law will bring a fixed plate for her a  couple times a week Eats alone Prepared meals in the past for her now deceased husband and her grandson that no longer lives with her Encouraged routine meals with lean protein, frutis/vegetables States that she felt good this morning and ate a biscuit with country ham and tomato Recommended nutritional supplement like Boost Previously prepared handout on meal planning and mailed to patient Suggested she talk with LCSW regarding psychosocial concerns  Patient Self Care Activities:  Performs ADL's independently Performs IADL's independently   Please see past updates related to this goal by clicking on the "Past Updates" button in the selected goal           This is a list of Health Maintenance Items that are overdue or due now: Health Maintenance Due  Topic Date Due   Zoster Vaccines- Shingrix (1 of 2) Never done   COLON CANCER SCREENING ANNUAL FOBT  11/08/2018   TETANUS/TDAP  03/13/2020   HEMOGLOBIN A1C  06/30/2020   INFLUENZA VACCINE  10/11/2020     Orders/Referrals Placed Today: Orders Placed This Encounter  Procedures   Urine Culture   Urinalysis   (Contact our referral department at [phone] if you have not spoken with someone about your referral appointment within the next 5 days)    Follow-up Plan  Scheduled with Arville Care, MD on 12/06/20 at 12:55pm

## 2020-11-16 NOTE — Assessment & Plan Note (Signed)
Increase hydration, Tylenol for any type of pain, urinalysis completed results pending.

## 2020-11-17 LAB — URINE CULTURE

## 2020-11-18 LAB — URINE CULTURE

## 2020-11-18 NOTE — Progress Notes (Signed)
Pt aware of note  

## 2020-11-29 ENCOUNTER — Other Ambulatory Visit: Payer: Self-pay | Admitting: Family Medicine

## 2020-11-29 DIAGNOSIS — E039 Hypothyroidism, unspecified: Secondary | ICD-10-CM

## 2020-12-06 ENCOUNTER — Encounter: Payer: Self-pay | Admitting: Family Medicine

## 2020-12-06 ENCOUNTER — Ambulatory Visit (INDEPENDENT_AMBULATORY_CARE_PROVIDER_SITE_OTHER): Payer: Medicare HMO | Admitting: Family Medicine

## 2020-12-06 ENCOUNTER — Other Ambulatory Visit: Payer: Self-pay

## 2020-12-06 VITALS — BP 135/69 | HR 90 | Ht 66.0 in | Wt 178.0 lb

## 2020-12-06 DIAGNOSIS — E785 Hyperlipidemia, unspecified: Secondary | ICD-10-CM | POA: Diagnosis not present

## 2020-12-06 DIAGNOSIS — Z1211 Encounter for screening for malignant neoplasm of colon: Secondary | ICD-10-CM | POA: Diagnosis not present

## 2020-12-06 DIAGNOSIS — I152 Hypertension secondary to endocrine disorders: Secondary | ICD-10-CM

## 2020-12-06 DIAGNOSIS — E039 Hypothyroidism, unspecified: Secondary | ICD-10-CM | POA: Diagnosis not present

## 2020-12-06 DIAGNOSIS — E1159 Type 2 diabetes mellitus with other circulatory complications: Secondary | ICD-10-CM | POA: Diagnosis not present

## 2020-12-06 DIAGNOSIS — E1142 Type 2 diabetes mellitus with diabetic polyneuropathy: Secondary | ICD-10-CM | POA: Diagnosis not present

## 2020-12-06 DIAGNOSIS — E1169 Type 2 diabetes mellitus with other specified complication: Secondary | ICD-10-CM

## 2020-12-06 LAB — BAYER DCA HB A1C WAIVED: HB A1C (BAYER DCA - WAIVED): 5.3 % (ref 4.8–5.6)

## 2020-12-06 MED ORDER — TIZANIDINE HCL 4 MG PO TABS: 4.0000 mg | ORAL_TABLET | Freq: Three times a day (TID) | ORAL | 5 refills | Status: DC | PRN

## 2020-12-06 NOTE — Progress Notes (Signed)
BP 135/69   Pulse 90   Ht _0  (1.676 m)   Wt 178 lb (80.7 kg)   SpO2 94%   BMI 28.73 kg/m    Subjective:   Patient ID: Laura Dalton, female    DOB: 08/15/1950, 70 y.o.   MRN: [id number]  HPI: Laura Dalton is a 70 y.o. female presenting on 12/06/2020 for Medical Management of Chronic Issues, Hyperlipidemia, Hypertension, Diabetes, and Hypothyroidism   HPI Type 2 diabetes mellitus Patient comes in today for recheck of his diabetes. Patient has been currently taking no medication currently we are monitoring and is doing diet control. Patient is currently on an ACE inhibitor/ARB. Patient has not seen an ophthalmologist this year. Patient denies any issues with their feet. The symptom started onset as an adult hypertension and hyperlipidemia ARE RELATED TO DM   Hypertension Patient is currently on lisinopril, and their blood pressure today is 135/69. Patient denies any lightheadedness or dizziness. Patient denies headaches, blurred vision, chest pains, shortness of breath, or weakness. Denies any side effects from medication and is content with current medication.   Hyperlipidemia Patient is coming in for recheck of his hyperlipidemia. The patient is currently taking atorvastatin. They deny any issues with myalgias or history of liver damage from it. They deny any focal numbness or weakness or chest pain.   Hypothyroidism recheck Patient is coming in for thyroid recheck today as well. They deny any issues with hair changes or heat or cold problems or diarrhea or constipation. They deny any chest pain or palpitations. They are currently on levothyroxine 88 micrograms   Patient has chronic back pain and takes muscle laxer and amitriptyline to help her sleep and manage her back issues.  She feels like the amitriptyline does very well for her but they are starting to charge her more and she wonders how to help with that.  Relevant past medical, surgical, family and social history  reviewed and updated as indicated. Interim medical history since our last visit reviewed. Allergies and medications reviewed and updated.  Review of Systems  Constitutional:  Negative for chills and fever.  Eyes:  Negative for visual disturbance.  Respiratory:  Negative for chest tightness and shortness of breath.   Cardiovascular:  Negative for chest pain and leg swelling.  Musculoskeletal:  Positive for arthralgias and back pain. Negative for gait problem.  Skin:  Negative for rash.  Neurological:  Negative for dizziness, light-headedness and headaches.  Psychiatric/Behavioral:  Negative for agitation and behavioral problems.   All other systems reviewed and are negative.  Per HPI unless specifically indicated above   Allergies as of 12/06/2020       Reactions   Aspirin Hives   Gabapentin Other (See Comments)   Dizzy,falls        Medication List        Accurate as of December 06, 2020  1:47 PM. If you have any questions, ask your nurse or doctor.          STOP taking these medications    baclofen 10 MG tablet Commonly known as: LIORESAL Stopped by: Fransisca Kaufmann Bevin Das, MD   cephALEXin 500 MG capsule Commonly known as: Keflex Stopped by: Worthy Rancher, MD   dextromethorphan-guaiFENesin 30-600 MG 12hr tablet Commonly known as: [place] DM Stopped by: Fransisca Kaufmann Wylie Russon, MD   sulfamethoxazole-trimethoprim 800-160 MG tablet Commonly known as: Bactrim DS Stopped by: Fransisca Kaufmann Kristy Catoe, MD   vitamin C 500 MG tablet Commonly known as: ASCORBIC  ACID Stopped by: Fransisca Kaufmann Holiday Mcmenamin, MD       TAKE these medications    amitriptyline 100 MG tablet Commonly known as: ELAVIL Take 1 tablet (100 mg total) by mouth at bedtime.   atorvastatin 40 MG tablet Commonly known as: LIPITOR Take 1 tablet (40 mg total) by mouth daily.   calcium carbonate 1500 (600 Ca) MG Tabs tablet Commonly known as: OSCAL Take by mouth 2 (two) times daily with a meal.    clopidogrel 75 MG tablet Commonly known as: PLAVIX Take 1 tablet (75 mg total) by mouth daily.   Euthyrox 88 MCG tablet Generic drug: levothyroxine TAKE 1 TABLET BY MOUTH ONCE DAILY BEFORE BREAKFAST   fluticasone 50 MCG/ACT nasal spray Commonly known as: FLONASE Place 2 sprays into both nostrils daily.   lisinopril 40 MG tablet Commonly known as: ZESTRIL Take 1 tablet by mouth once daily   tiZANidine 4 MG tablet Commonly known as: Zanaflex Take 1 tablet (4 mg total) by mouth every 8 (eight) hours as needed for muscle spasms. Started by: Fransisca Kaufmann Marymargaret Kirker, MD   triamcinolone cream 0.1 % Commonly known as: KENALOG Apply 1 application topically 2 (two) times daily.         Objective:   BP 135/69   Pulse 90   Ht _0  (1.676 m)   Wt 178 lb (80.7 kg)   SpO2 94%   BMI 28.73 kg/m   Wt Readings from Last 3 Encounters:  12/06/20 178 lb (80.7 kg)  07/16/20 180 lb (81.6 kg)  12/31/19 177 lb (80.3 kg)    Physical Exam Vitals and nursing note reviewed.  Constitutional:      General: She is not in acute distress.    Appearance: She is well-developed. She is not diaphoretic.  Eyes:     Conjunctiva/sclera: Conjunctivae normal.  Cardiovascular:     Rate and Rhythm: Normal rate and regular rhythm.     Heart sounds: Normal heart sounds. No murmur heard. Pulmonary:     Effort: Pulmonary effort is normal. No respiratory distress.     Breath sounds: Normal breath sounds. No wheezing.  Musculoskeletal:        General: No swelling or tenderness. Normal range of motion.  Skin:    General: Skin is warm and dry.     Findings: No rash.  Neurological:     Mental Status: She is alert and oriented to person, place, and time.     Coordination: Coordination normal.  Psychiatric:        Behavior: Behavior normal.    Assessment & Plan:   Problem List Items Addressed This Visit       Cardiovascular and Mediastinum   Hypertension associated with diabetes ([place])   Relevant  Orders   CBC with Differential/Platelet   CMP14+EGFR   Lipid panel   TSH   Bayer DCA Hb A1c Waived     Endocrine   DM (diabetes mellitus) ([place]) - Primary   Relevant Orders   CBC with Differential/Platelet   CMP14+EGFR   Lipid panel   TSH   Bayer DCA Hb A1c Waived   Hyperlipidemia associated with type 2 diabetes mellitus (HCC)   Relevant Orders   CBC with Differential/Platelet   CMP14+EGFR   Lipid panel   TSH   Bayer DCA Hb A1c Waived   Diabetic peripheral neuropathy associated with type 2 diabetes mellitus (HCC)   Relevant Medications   tiZANidine (ZANAFLEX) 4 MG tablet   Hypothyroidism   Relevant Orders  CBC with Differential/Platelet   CMP14+EGFR   Lipid panel   TSH   Bayer DCA Hb A1c Waived   Other Visit Diagnoses     Colon cancer screening       Relevant Orders   Fecal occult blood, imunochemical       Continue current medicine.  We will check blood work. Follow up plan: Return in about 3 months (around 03/07/2021), or if symptoms worsen or fail to improve, for Diabetes and hypertension and thyroid.  Counseling provided for all of the vaccine components Orders Placed This Encounter  Procedures   Fecal occult blood, imunochemical   CBC with Differential/Platelet   CMP14+EGFR   Lipid panel   TSH   Bayer DCA Hb A1c Waived    Caryl Pina, MD [facility] 12/06/2020, 1:47 PM

## 2020-12-07 LAB — CMP14+EGFR
ALT: 6 IU/L (ref 0–32)
AST: 17 IU/L (ref 0–40)
Albumin/Globulin Ratio: 2.1 (ref 1.2–2.2)
Albumin: 4.9 g/dL — ABNORMAL HIGH (ref 3.7–4.7)
Alkaline Phosphatase: 87 IU/L (ref 44–121)
BUN/Creatinine Ratio: 11 — ABNORMAL LOW (ref 12–28)
BUN: 7 mg/dL — ABNORMAL LOW (ref 8–27)
Bilirubin Total: 0.3 mg/dL (ref 0.0–1.2)
CO2: 19 mmol/L — ABNORMAL LOW (ref 20–29)
Calcium: 9.6 mg/dL (ref 8.7–10.3)
Chloride: 101 mmol/L (ref 96–106)
Creatinine, Ser: 0.64 mg/dL (ref 0.57–1.00)
Globulin, Total: 2.3 g/dL (ref 1.5–4.5)
Glucose: 93 mg/dL (ref 70–99)
Potassium: 4.4 mmol/L (ref 3.5–5.2)
Sodium: 143 mmol/L (ref 134–144)
Total Protein: 7.2 g/dL (ref 6.0–8.5)
eGFR: 94 mL/min/{1.73_m2} (ref >59)

## 2020-12-07 LAB — CBC WITH DIFFERENTIAL/PLATELET
Basophils Absolute: 0 10*3/uL (ref 0.0–0.2)
Basos: 0 %
EOS (ABSOLUTE): 0.3 10*3/uL (ref 0.0–0.4)
Eos: 4 %
Hematocrit: 38.4 % (ref 34.0–46.6)
Hemoglobin: 12.6 g/dL (ref 11.1–15.9)
Immature Grans (Abs): 0 10*3/uL (ref 0.0–0.1)
Immature Granulocytes: 0 %
Lymphocytes Absolute: 1.6 10*3/uL (ref 0.7–3.1)
Lymphs: 21 %
MCH: 27 pg (ref 26.6–33.0)
MCHC: 32.8 g/dL (ref 31.5–35.7)
MCV: 82 fL (ref 79–97)
Monocytes Absolute: 0.8 10*3/uL (ref 0.1–0.9)
Monocytes: 11 %
Neutrophils Absolute: 5 10*3/uL (ref 1.4–7.0)
Neutrophils: 64 %
Platelets: 268 10*3/uL (ref 150–450)
RBC: 4.66 x10E6/uL (ref 3.77–5.28)
RDW: 13.1 % (ref 11.7–15.4)
WBC: 7.7 10*3/uL (ref 3.4–10.8)

## 2020-12-07 LAB — LIPID PANEL
Chol/HDL Ratio: 3.1 ratio (ref 0.0–4.4)
Cholesterol, Total: 151 mg/dL (ref 100–199)
HDL: 48 mg/dL (ref >39)
LDL Chol Calc (NIH): 82 mg/dL (ref 0–99)
Triglycerides: 115 mg/dL (ref 0–149)
VLDL Cholesterol Cal: 21 mg/dL (ref 5–40)

## 2020-12-07 LAB — TSH: TSH: 2.49 u[IU]/mL (ref 0.450–4.500)

## 2021-01-05 ENCOUNTER — Other Ambulatory Visit: Payer: Self-pay | Admitting: Family Medicine

## 2021-01-05 DIAGNOSIS — Z1231 Encounter for screening mammogram for malignant neoplasm of breast: Secondary | ICD-10-CM

## 2021-01-11 ENCOUNTER — Ambulatory Visit (INDEPENDENT_AMBULATORY_CARE_PROVIDER_SITE_OTHER): Payer: Medicare Other

## 2021-01-11 VITALS — Ht 66.0 in | Wt 182.0 lb

## 2021-01-11 DIAGNOSIS — R296 Repeated falls: Secondary | ICD-10-CM | POA: Diagnosis not present

## 2021-01-11 DIAGNOSIS — Z599 Problem related to housing and economic circumstances, unspecified: Secondary | ICD-10-CM

## 2021-01-11 DIAGNOSIS — Z0001 Encounter for general adult medical examination with abnormal findings: Secondary | ICD-10-CM

## 2021-01-11 DIAGNOSIS — Z0000 Encounter for general adult medical examination without abnormal findings: Secondary | ICD-10-CM

## 2021-01-11 NOTE — Patient Instructions (Signed)
[name] , Thank you for taking time to come for your Medicare Wellness Visit. I appreciate your ongoing commitment to your health goals. Please review the following plan we discussed and let me know if I can assist you in the future.   Screening recommendations/referrals: Colonoscopy: Declined Mammogram: Done 05/27/2019 - Repeat annually *appointment 01/17/21 Bone Density: Done 09/21/2016 - Repeat every 2 years *due at next visit Recommended yearly ophthalmology/optometry visit for glaucoma screening and checkup Recommended yearly dental visit for hygiene and checkup  Vaccinations: Influenza vaccine: Declined Pneumococcal vaccine: Declined Tdap vaccine: Declined Shingles vaccine: Declined   Covid-19:Declined  Advanced directives: Please bring a copy of your health care power of attorney and living will to the office to be added to your chart at your convenience.   Conditions/risks identified: Aim for 30 minutes of exercise or brisk walking each day, drink 6-8 glasses of water and eat lots of fruits and vegetables.  Next appointment: Follow up in one year for your annual wellness visit    Preventive Care 65 Years and Older, Female Preventive care refers to lifestyle choices and visits with your health care provider that can promote health and wellness. What does preventive care include? A yearly physical exam. This is also called an annual well check. Dental exams once or twice a year. Routine eye exams. Ask your health care provider how often you should have your eyes checked. Personal lifestyle choices, including: Daily care of your teeth and gums. Regular physical activity. Eating a healthy diet. Avoiding tobacco and drug use. Limiting alcohol use. Practicing safe sex. Taking low-dose aspirin every day. Taking vitamin and mineral supplements as recommended by your health care provider. What happens during an annual well check? The services and screenings done by your health  care provider during your annual well check will depend on your age, overall health, lifestyle risk factors, and family history of disease. Counseling  Your health care provider may ask you questions about your: Alcohol use. Tobacco use. Drug use. Emotional well-being. Home and relationship well-being. Sexual activity. Eating habits. History of falls. Memory and ability to understand (cognition). Work and work Astronomer. Reproductive health. Screening  You may have the following tests or measurements: Height, weight, and BMI. Blood pressure. Lipid and cholesterol levels. These may be checked every 5 years, or more frequently if you are over 31 years old. Skin check. Lung cancer screening. You may have this screening every year starting at age 69 if you have a 30-pack-year history of smoking and currently smoke or have quit within the past 15 years. Fecal occult blood test (FOBT) of the stool. You may have this test every year starting at age 63. Flexible sigmoidoscopy or colonoscopy. You may have a sigmoidoscopy every 5 years or a colonoscopy every 10 years starting at age 7. Hepatitis C blood test. Hepatitis B blood test. Sexually transmitted disease (STD) testing. Diabetes screening. This is done by checking your blood sugar (glucose) after you have not eaten for a while (fasting). You may have this done every 1-3 years. Bone density scan. This is done to screen for osteoporosis. You may have this done starting at age 49. Mammogram. This may be done every 1-2 years. Talk to your health care provider about how often you should have regular mammograms. Talk with your health care provider about your test results, treatment options, and if necessary, the need for more tests. Vaccines  Your health care provider may recommend certain vaccines, such as: Influenza vaccine. This is  recommended every year. Tetanus, diphtheria, and acellular pertussis (Tdap, Td) vaccine. You may need a Td  booster every 10 years. Zoster vaccine. You may need this after age 38. Pneumococcal 13-valent conjugate (PCV13) vaccine. One dose is recommended after age 74. Pneumococcal polysaccharide (PPSV23) vaccine. One dose is recommended after age 39. Talk to your health care provider about which screenings and vaccines you need and how often you need them. This information is not intended to replace advice given to you by your health care provider. Make sure you discuss any questions you have with your health care provider. Document Released: 03/26/2015 Document Revised: 11/17/2015 Document Reviewed: 12/29/2014 Elsevier Interactive Patient Education  2017 [place] Prevention in the Home Falls can cause injuries. They can happen to people of all ages. There are many things you can do to make your home safe and to help prevent falls. What can I do on the outside of my home? Regularly fix the edges of walkways and driveways and fix any cracks. Remove anything that might make you trip as you walk through a door, such as a raised step or threshold. Trim any bushes or trees on the path to your home. Use bright outdoor lighting. Clear any walking paths of anything that might make someone trip, such as rocks or tools. Regularly check to see if handrails are loose or broken. Make sure that both sides of any steps have handrails. Any raised decks and porches should have guardrails on the edges. Have any leaves, snow, or ice cleared regularly. Use sand or salt on walking paths during winter. Clean up any spills in your garage right away. This includes oil or grease spills. What can I do in the bathroom? Use night lights. Install grab bars by the toilet and in the tub and shower. Do not use towel bars as grab bars. Use non-skid mats or decals in the tub or shower. If you need to sit down in the shower, use a plastic, non-slip stool. Keep the floor dry. Clean up any water that spills on the floor  as soon as it happens. Remove soap buildup in the tub or shower regularly. Attach bath mats securely with double-sided non-slip rug tape. Do not have throw rugs and other things on the floor that can make you trip. What can I do in the bedroom? Use night lights. Make sure that you have a light by your bed that is easy to reach. Do not use any sheets or blankets that are too big for your bed. They should not hang down onto the floor. Have a firm chair that has side arms. You can use this for support while you get dressed. Do not have throw rugs and other things on the floor that can make you trip. What can I do in the kitchen? Clean up any spills right away. Avoid walking on wet floors. Keep items that you use a lot in easy-to-reach places. If you need to reach something above you, use a strong step stool that has a grab bar. Keep electrical cords out of the way. Do not use floor polish or wax that makes floors slippery. If you must use wax, use non-skid floor wax. Do not have throw rugs and other things on the floor that can make you trip. What can I do with my stairs? Do not leave any items on the stairs. Make sure that there are handrails on both sides of the stairs and use them. Fix handrails that are  broken or loose. Make sure that handrails are as long as the stairways. Check any carpeting to make sure that it is firmly attached to the stairs. Fix any carpet that is loose or worn. Avoid having throw rugs at the top or bottom of the stairs. If you do have throw rugs, attach them to the floor with carpet tape. Make sure that you have a light switch at the top of the stairs and the bottom of the stairs. If you do not have them, ask someone to add them for you. What else can I do to help prevent falls? Wear shoes that: Do not have high heels. Have rubber bottoms. Are comfortable and fit you well. Are closed at the toe. Do not wear sandals. If you use a stepladder: Make sure that it is  fully opened. Do not climb a closed stepladder. Make sure that both sides of the stepladder are locked into place. Ask someone to hold it for you, if possible. Clearly mark and make sure that you can see: Any grab bars or handrails. First and last steps. Where the edge of each step is. Use tools that help you move around (mobility aids) if they are needed. These include: Canes. Walkers. Scooters. Crutches. Turn on the lights when you go into a dark area. Replace any light bulbs as soon as they burn out. Set up your furniture so you have a clear path. Avoid moving your furniture around. If any of your floors are uneven, fix them. If there are any pets around you, be aware of where they are. Review your medicines with your doctor. Some medicines can make you feel dizzy. This can increase your chance of falling. Ask your doctor what other things that you can do to help prevent falls. This information is not intended to replace advice given to you by your health care provider. Make sure you discuss any questions you have with your health care provider. Document Released: 12/24/2008 Document Revised: 08/05/2015 Document Reviewed: 04/03/2014 Elsevier Interactive Patient Education  2017 [employer].

## 2021-01-11 NOTE — Progress Notes (Signed)
Subjective:   Laura Dalton is a 70 y.o. female who presents for Medicare Annual (Subsequent) preventive examination.  Virtual Visit via Telephone Note  I connected with  Laura Dalton on 01/11/21 at 10:30 AM EDT by telephone and verified that I am speaking with the correct person using two identifiers.  Location: Patient: Home Provider: [facility] Persons participating in the virtual visit: patient/Nurse Health Advisor   I discussed the limitations, risks, security and privacy concerns of performing an evaluation and management service by telephone and the availability of in person appointments. The patient expressed understanding and agreed to proceed.  Interactive audio and video telecommunications were attempted between this nurse and patient, however failed, due to patient having technical difficulties OR patient did not have access to video capability.  We continued and completed visit with audio only.  Some vital signs may be absent or patient reported.   Maciej Schweitzer E Eriel Dunckel, LPN   Review of Systems     Cardiac Risk Factors include: advanced age (>71men, >68 women);dyslipidemia;hypertension;obesity (BMI >30kg/m2);sedentary lifestyle     Objective:    Today's Vitals   01/11/21 1036 01/11/21 1037  Weight: 182 lb (82.6 kg)   Height: 5\' 6"  (1.676 m)   PainSc:  8    Body mass index is 29.38 kg/m.  Advanced Directives 01/11/2021 11/16/2020 12/16/2018 06/20/2018 09/03/2017 08/31/2016 08/01/2015  Does Patient Have a Medical Advance Directive? No No No No Yes No Yes  Type of Advance Directive - - - - Midwife;Living will - Living will  Does patient want to make changes to medical advance directive? - - - - No - Patient declined - -  Copy of Healthcare Power of Attorney in Chart? - - - - No - copy requested - No - copy requested  Would patient like information on creating a medical advance directive? No - Patient declined No - Patient declined No - Patient declined -  - Yes (MAU/Ambulatory/Procedural Areas - Information given) -    Current Medications (verified) Outpatient Encounter Medications as of 01/11/2021  Medication Sig   amitriptyline (ELAVIL) 100 MG tablet Take 1 tablet (100 mg total) by mouth at bedtime.   atorvastatin (LIPITOR) 40 MG tablet Take 1 tablet (40 mg total) by mouth daily.   calcium carbonate (OSCAL) 1500 (600 Ca) MG TABS tablet Take by mouth 2 (two) times daily with a meal.   clopidogrel (PLAVIX) 75 MG tablet Take 1 tablet (75 mg total) by mouth daily.   EUTHYROX 88 MCG tablet TAKE 1 TABLET BY MOUTH ONCE DAILY BEFORE BREAKFAST   fluticasone (FLONASE) 50 MCG/ACT nasal spray Place 2 sprays into both nostrils daily.   lisinopril (ZESTRIL) 40 MG tablet Take 1 tablet by mouth once daily   tiZANidine (ZANAFLEX) 4 MG tablet Take 1 tablet (4 mg total) by mouth every 8 (eight) hours as needed for muscle spasms.   triamcinolone cream (KENALOG) 0.1 % Apply 1 application topically 2 (two) times daily.   albuterol (VENTOLIN HFA) 108 (90 Base) MCG/ACT inhaler SMARTSIG:2 Puff(s) By Mouth Every 6 Hours PRN (Patient not taking: Reported on 01/11/2021)   No facility-administered encounter medications on file as of 01/11/2021.    Allergies (verified) Aspirin and Gabapentin   History: Past Medical History:  Diagnosis Date   Bursitis of hip    Diabetes mellitus without complication (HCC)    type II   Hyperlipidemia    Hypertension    Hypothyroidism    Low back pain  benign turmor back   Stroke [facility])    Vitamin D deficiency disease    Past Surgical History:  Procedure Laterality Date   APPENDECTOMY     BACK SURGERY  1996   turmor on back    CHOLECYSTECTOMY     LEG SURGERY Right    "clot and had drain in leg  rt lower leg   Family History  Problem Relation Age of Onset   Diabetes Mother    Stroke Mother    Seizures Mother    Cancer Father        prostate   Thyroid disease Son    Hashimoto's thyroiditis Son    COPD Sister     Cancer Brother        lung   COPD Sister    COPD Sister    Cancer Brother        prostate   COPD Brother    COPD Brother    Social History   Socioeconomic History   Marital status: Widowed    Spouse name: Not on file   Number of children: 3   Years of education: 14   Highest education level: Associate degree: academic program  Occupational History   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   Children out of town   She depends on her neighbor a lot for yard work and to check on her daily   Social Determinants of Corporate investment banker Strain: Low Risk    Difficulty of Paying Living Expenses: Not hard at all  Food Insecurity: No Food Insecurity   Worried About Programme researcher, broadcasting/film/video in the Last Year: Never true   Barista in the Last Year: Never true  Transportation Needs: No Transportation Needs   Lack of Transportation (Medical): No   Lack of Transportation (Non-Medical): No  Physical Activity: Inactive   Days of Exercise per Week: 0 days   Minutes of Exercise per Session: 0 min  Stress: No Stress Concern Present   Feeling of Stress : Not at all  Social Connections: Moderately Integrated   Frequency of Communication with Friends and Family: More than three times a week   Frequency of Social Gatherings with Friends and Family: More than three times a week   Attends Religious Services: More than 4 times per year   Active Member of [employer] or Organizations: Yes   Attends Tax inspector Meetings: More than 4 times per year   Marital Status: Widowed    Tobacco Counseling Counseling given: Not Answered   Clinical Intake:  Pre-visit preparation completed: Yes  Pain : 0-10 Pain Score: 8  Pain Type: Chronic pain Pain Location: Back Pain Descriptors / Indicators: Aching, Discomfort Pain Onset: More than a month ago Pain Frequency: Intermittent     BMI - recorded: 29.38 Nutritional Status: BMI 25 -29  Overweight Nutritional Risks: None Diabetes: No  How often do you need to have someone help you when you read instructions, pamphlets, or other written materials from your doctor or pharmacy?: 1 - Never  Diabetic? no  Interpreter Needed?: No  Information entered by :: Gumaro Brightbill, LPN   Activities of Daily Living In your present state of health, do you have any difficulty performing the following activities: 01/11/2021 11/16/2020  Hearing? N N  Vision? N N  Difficulty concentrating or making decisions? Y N  Walking or climbing stairs? Y Y  Dressing or bathing? N N  Doing errands, shopping? N N  Preparing Food and eating ? N N  Using the Toilet? N N  In the past six months, have you accidently leaked urine? Y Y  Do you have problems with loss of bowel control? Y Y  Comment wears pads at all times - needs depends, but cannot afford them -  Managing your Medications? N N  Managing your Finances? N N  Housekeeping or managing your Housekeeping? N N  Some recent data might be hidden    Patient Care Team: Dettinger, Elige Radon, MD as PCP - General (Family Medicine) Branch, Dorothe Pea, MD as PCP - Cardiology (Cardiology) Donnetta Hail, MD as Consulting Physician (Rheumatology) Ranee Gosselin, MD as Consulting Physician (Orthopedic Surgery) Valetta Fuller, Brand Males, NP (Inactive) as Nurse Practitioner (Internal Medicine) Gwenith Daily, RN as Registered Nurse  Indicate any recent Medical Services you may have received from other than [facility] providers in  the past year (date may be approximate).     Assessment:   This is a routine wellness examination for Laura Dalton.  Hearing/Vision screen Hearing Screening - Comments:: Denies hearing difficulties  Vision Screening - Comments:: Wears rx glasses - up to date with annual eye exams with Dr Earlene Plater in [place]  Dietary issues and exercise activities discussed: Current Exercise Habits: The patient does not participate in regular exercise at present, Exercise limited by: orthopedic condition(s);neurologic condition(s)   Goals Addressed             This Visit's Progress    Exercise 3x per week (30 min per time)   Not on track    Have 3 meals a day   On track      Depression Screen PHQ 2/9 Scores 01/11/2021 12/06/2020 09/26/2019 06/27/2019 12/16/2018 12/12/2018 11/07/2017  PHQ - 2 Score 1 2 0 0 0 0 0  PHQ- 9 Score 5 7 - - - - -    Fall Risk Fall Risk  01/11/2021 12/06/2020 11/16/2020 12/31/2019 09/26/2019  Falls in the past year? 1 1 1 1 1   Number falls in past yr: 1 1 1  0 1  Injury with Fall? 0 0 0 0 1  Risk for fall due to : History of fall(s);Impaired balance/gait;Medication side effect;Orthopedic patient;Impaired vision Impaired balance/gait Impaired balance/gait Medication side effect Impaired balance/gait  Risk for fall due to: Comment - - - - -  Follow up Education provided;Falls prevention discussed Falls evaluation completed Falls evaluation completed Falls evaluation completed Falls evaluation completed  Comment - - - - -    FALL RISK PREVENTION PERTAINING TO THE HOME:  Any stairs in or around the home? Yes  If so, are there any without handrails? No  Home free of loose throw rugs in walkways, pet beds, electrical cords, etc? Yes  Adequate lighting in your home to reduce risk of falls? Yes   ASSISTIVE DEVICES UTILIZED TO PREVENT FALLS:  Life  alert? No  Use of a cane, walker or w/c? Yes  Grab bars in the bathroom? Yes  Shower chair or bench in shower? No  Elevated toilet seat or a  handicapped toilet? Yes   TIMED UP AND GO:  Was the test performed? No . Telephonic visit  Cognitive Function: Normal cognitive status assessed by direct observation by this Nurse Health Advisor. No abnormalities found.  She just did 6CIT 2 months ago - normal  MMSE - Mini Mental State Exam 09/03/2017 08/31/2016  Orientation to time 5 5  Orientation to Place 5 5  Registration 3 3  Attention/ Calculation 5 5  Recall 2 3  Language- name 2 objects 2 2  Language- repeat 1 1  Language- follow 3 step command 3 3  Language- read & follow direction 1 1  Write a sentence 1 1  Copy design 1 1  Total score 29 30     6CIT Screen 11/16/2020 12/16/2018  What Year? 0 points 0 points  What month? 0 points 0 points  What time? 0 points 0 points  Count back from 20 0 points 0 points  Months in reverse 0 points 2 points  Repeat phrase 4 points 2 points  Total Score 4 4    Immunizations Immunization History  Administered Date(s) Administered   Influenza Whole 03/13/2010   Tdap 03/13/2010    TDAP status: Due, Education has been provided regarding the importance of this vaccine. Advised may receive this vaccine at local pharmacy or Health Dept. Aware to provide a copy of the vaccination record if obtained from local pharmacy or Health Dept. Verbalized acceptance and understanding.  Flu Vaccine status: Declined, Education has been provided regarding the importance of this vaccine but patient still declined. Advised may receive this vaccine at local pharmacy or Health Dept. Aware to provide a copy of the vaccination record if obtained from local pharmacy or Health Dept. Verbalized acceptance and understanding.  Pneumococcal vaccine status: Declined,  Education has been provided regarding the importance of this vaccine but patient still declined. Advised may receive this vaccine at local pharmacy or Health Dept. Aware to provide a copy of the vaccination record if obtained from local pharmacy or  Health Dept. Verbalized acceptance and understanding.   Covid-19 vaccine status: Declined, Education has been provided regarding the importance of this vaccine but patient still declined. Advised may receive this vaccine at local pharmacy or Health Dept.or vaccine clinic. Aware to provide a copy of the vaccination record if obtained from local pharmacy or Health Dept. Verbalized acceptance and understanding.  Qualifies for Shingles Vaccine? Yes   Zostavax completed No   Shingrix Completed?: No.    Education has been provided regarding the importance of this vaccine. Patient has been advised to call insurance company to determine out of pocket expense if they have not yet received this vaccine. Advised may also receive vaccine at local pharmacy or Health Dept. Verbalized acceptance and understanding.  Screening Tests Health Maintenance  Topic Date Due   Pneumonia Vaccine 38+ Years old (1 - PCV) Never done   COLONOSCOPY (Pts 45-37yrs Insurance coverage will need to be confirmed)  Never done   FOOT EXAM  12/30/2020   Zoster Vaccines- Shingrix (1 of 2) 03/07/2021 (Originally 08/15/1999)   COLON CANCER SCREENING ANNUAL FOBT  03/14/2021 (Originally 11/08/2018)   INFLUENZA VACCINE  06/10/2021 (Originally 10/11/2020)   TETANUS/TDAP  12/06/2021 (Originally 03/13/2020)   OPHTHALMOLOGY EXAM  02/22/2021   MAMMOGRAM  05/21/2021   HEMOGLOBIN A1C  06/05/2021   DEXA SCAN  Completed   Hepatitis C Screening  Completed   HPV VACCINES  Aged Out   COVID-19 Vaccine  Discontinued    Health Maintenance  Health Maintenance Due  Topic Date Due   Pneumonia Vaccine 51+ Years old (1 - PCV) Never done   COLONOSCOPY (Pts 45-70yrs Insurance coverage will need to be confirmed)  Never done   FOOT EXAM  12/30/2020    Colorectal cancer screening: No longer required.  She declines  Mammogram status: Completed 05/27/2019. Repeat every year has appt 01/17/21  Bone Density status: Completed 09/21/2016. Results reflect: Bone  density results: OSTEOPENIA. Repeat every 2 years.  Lung Cancer Screening: (Low Dose CT Chest recommended if Age 57-80 years, 30 pack-year currently smoking OR have quit w/in 15years.) does not qualify.   Additional Screening:  Hepatitis C Screening: does qualify; Completed 03/12/2015  Vision Screening: Recommended annual ophthalmology exams for early detection of glaucoma and other disorders of the eye. Is the patient up to date with their annual eye exam?  Yes  Who is the provider or what is the name of the office in which the patient attends annual eye exams? [facility] in [place] If pt is not established with a provider, would they like to be referred to a provider to establish care? No .   Dental Screening: Recommended annual dental exams for proper oral hygiene  Community Resource Referral / Chronic Care Management: CRR required this visit?  No   CCM required this visit?  No      Plan:     I have personally reviewed and noted the following in the patient's chart:   Medical and social history Use of alcohol, tobacco or illicit drugs  Current medications and supplements including opioid prescriptions.  Functional ability and status Nutritional status Physical activity Advanced directives List of other physicians Hospitalizations, surgeries, and ER visits in previous 12 months Vitals Screenings to include cognitive, depression, and falls Referrals and appointments  In addition, I have reviewed and discussed with patient certain preventive protocols, quality metrics, and best practice recommendations. A written personalized care plan for preventive services as well as general preventive health recommendations were provided to patient.     [place] Constable, LPN   16/03/958   Nurse Notes: Would like rx for shower chair and for depends (she has very little control over her bladder or bowels) Sent message to [facility] to see if they can help her get a life alert.

## 2021-01-17 ENCOUNTER — Other Ambulatory Visit: Payer: Self-pay

## 2021-01-17 ENCOUNTER — Ambulatory Visit
Admission: RE | Admit: 2021-01-17 | Discharge: 2021-01-17 | Disposition: A | Discharge status: Home / Self Care | Payer: Medicare Other | Source: Ambulatory Visit | Attending: Family Medicine | Admitting: Family Medicine

## 2021-01-17 DIAGNOSIS — Z1231 Encounter for screening mammogram for malignant neoplasm of breast: Secondary | ICD-10-CM

## 2021-01-18 ENCOUNTER — Telehealth: Payer: Self-pay

## 2021-01-18 NOTE — Telephone Encounter (Signed)
   Telephone encounter was:  Successful.  01/18/2021 Name: Laura Dalton MRN: [id number] DOB: 1951/01/21  Laura Dalton is a 70 y.o. year old female who is a primary care patient of Dettinger, Elige Radon, MD . The community resource team was consulted for assistance with  MedAlert.  Care guide performed the following interventions: Spoke with patient about [facility].  Patient is not interested now but will have her daughter call about it later.  Follow Up Plan:  No further follow up planned at this time. The patient has been provided with needed resources.  Laura Dalton, AAS Paralegal, [facility] Care Guide  Embedded Care Coordination [place]  Care Management  [address] [place], [place] [address] ??[email or web address]  ?? [phone]   [email or web address]

## 2021-01-19 NOTE — Progress Notes (Signed)
Cardiology Office Note  Date: 01/20/2021   ID: Sehar, Sedano [birth year], MRN [id number]  PCP:  Dettinger, Elige Radon, MD  Cardiologist:  Dina Rich, MD Electrophysiologist:  None   Chief Complaint: 1 year follow-up  History of Present Illness: Laura Dalton is a 70 y.o. female with a history of HTN, COPD, DM 2, HLD, hypothyroidism, CVA 2017.  She was last seen by Dr. Wyline Mood on 09/16/2019 for follow-up of previous history of chest pains.  Abnormal EKG at baseline.  She had completed an echo and stress echo that were overall unremarkable.  She had no recent chest pain.  She was compliant with her antihypertensive medication.  Her lipids in April 2021 showed an LDL of 60.  She was compliant with statin medication.  History of CVA in 2017 no long-term residual focal neurologic deficit.  She was continuing Plavix for secondary prevention due to aspirin allergy.  She had mild bilateral ICA disease less than 50% on carotid ultrasound 2017.  Evidence of CAD on CT April 2020 with moderate atherosclerosis.  No recent chest pains.  Plan for CAD was continue risk factor modification.  EKG on that visit showed no ischemic changes.  Previous evidence of CAD on prior CT imaging.  Prior negative stress test.  Lisinopril was increased to 40 mg daily.  Plan was to check basic metabolic panel in 2 weeks.  Her lipids were at goal.  She was continuing statin medication.   She is here today for 1 year follow-up.  She states about 2 months ago she had COVID infection.  She states she is fully recovered from the COVID virus.  Her blood pressure is elevated today on arrival at 140/68.  Blood pressure at PCP visit and September was 135/69.  She states she does not check her blood pressures at home.  She states she recently had some lab work at PCP office during September visit and she was taken off of metformin due to improved hemoglobin A1c of 5.3%.  She denies any anginal or exertional symptoms,  orthostatic symptoms, CVA or TIA-like symptoms, palpitations or arrhythmias, PND, orthopnea, claudication-like symptoms, bleeding, DVT or PE-like symptoms, or lower extremity edema.  EKG today shows sinus rhythm with first-degree AV block rate of 99.  Anterolateral infarct age undetermined.  Her last carotid study was back in 2017.  At that time it was recommended she undergo continued surveillance.  Current cardiac regimen includes Plavix 75 mg daily, atorvastatin 40 mg daily, lisinopril 40 mg daily.   Past Medical History:  Diagnosis Date   Bursitis of hip    Diabetes mellitus without complication (HCC)    type II   Hyperlipidemia    Hypertension    Hypothyroidism    Low back pain    benign turmor back   Stroke (HCC)    Vitamin D deficiency disease     Past Surgical History:  Procedure Laterality Date   APPENDECTOMY     BACK SURGERY  1996   turmor on back    CHOLECYSTECTOMY     LEG SURGERY Right    "clot and had drain in leg  rt lower leg    Current Outpatient Medications  Medication Sig Dispense Refill   amitriptyline (ELAVIL) 100 MG tablet Take 1 tablet (100 mg total) by mouth at bedtime. 90 tablet 3   atorvastatin (LIPITOR) 40 MG tablet Take 1 tablet (40 mg total) by mouth daily. 90 tablet 3   calcium carbonate (OSCAL) 1500 (600  Ca) MG TABS tablet Take by mouth 2 (two) times daily with a meal.     clopidogrel (PLAVIX) 75 MG tablet Take 1 tablet (75 mg total) by mouth daily. 90 tablet 3   EUTHYROX 88 MCG tablet TAKE 1 TABLET BY MOUTH ONCE DAILY BEFORE BREAKFAST 90 tablet 0   fluticasone (FLONASE) 50 MCG/ACT nasal spray Place 2 sprays into both nostrils daily. 16 g 6   lisinopril (ZESTRIL) 40 MG tablet Take 1 tablet by mouth once daily 90 tablet 3   tiZANidine (ZANAFLEX) 4 MG tablet Take 1 tablet (4 mg total) by mouth every 8 (eight) hours as needed for muscle spasms. 30 tablet 5   triamcinolone cream (KENALOG) 0.1 % Apply 1 application topically 2 (two) times daily. 80 g 0    albuterol (VENTOLIN HFA) 108 (90 Base) MCG/ACT inhaler SMARTSIG:2 Puff(s) By Mouth Every 6 Hours PRN (Patient not taking: No sig reported)     No current facility-administered medications for this visit.   Allergies:  Aspirin and Gabapentin   Social History: The patient  reports that she quit smoking about 13 years ago. Her smoking use included cigarettes. She started smoking about 58 years ago. She has a 22.50 pack-year smoking history. She has never used smokeless tobacco. She reports that she does not drink alcohol and does not use drugs.   Family History: The patient's family history includes COPD in her brother, brother, sister, sister, and sister; Cancer in her brother, brother, and father; Diabetes in her mother; Hashimoto's thyroiditis in her son; Seizures in her mother; Stroke in her mother; Thyroid disease in her son.   ROS:  Please see the history of present illness. Otherwise, complete review of systems is positive for none.  All other systems are reviewed and negative.   Physical Exam: VS:  BP 140/68   Pulse (!) 104   Ht 5\' 6"  (1.676 m)   Wt 185 lb (83.9 kg)   SpO2 96%   BMI 29.86 kg/m , BMI Body mass index is 29.86 kg/m.  Wt Readings from Last 3 Encounters:  01/20/21 185 lb (83.9 kg)  01/11/21 182 lb (82.6 kg)  12/06/20 178 lb (80.7 kg)    General: Patient appears comfortable at rest. Neck: Supple, no elevated JVP or carotid bruits, no thyromegaly. Lungs: Clear to auscultation, nonlabored breathing at rest. Cardiac: Regular rate and rhythm, no S3 or significant systolic murmur, no pericardial rub. Extremities: No pitting edema, distal pulses 2+. Skin: Warm and dry. Musculoskeletal: No kyphosis. Neuropsychiatric: Alert and oriented x3, affect grossly appropriate.  ECG: 01/20/2021 sinus rhythm first-degree AV block rate of 99, low voltage QRS, anterolateral infarct, age undetermined.  Recent Labwork: 12/06/2020: ALT 6; AST 17; BUN 7; Creatinine, Ser 0.64; Hemoglobin  12.6; Platelets 268; Potassium 4.4; Sodium 143; TSH 2.490     Component Value Date/Time   CHOL 151 12/06/2020 1302   CHOL 142 10/01/2012 1308   TRIG 115 12/06/2020 1302   TRIG 293 (H) 01/06/2013 1316   TRIG 156 (H) 10/01/2012 1308   HDL 48 12/06/2020 1302   HDL 45 01/06/2013 1316   HDL 38 (L) 10/01/2012 1308   CHOLHDL 3.1 12/06/2020 1302   CHOLHDL 2.5 08/02/2015 0455   VLDL 15 08/02/2015 0455   LDLCALC 82 12/06/2020 1302   LDLCALC 111 (H) 01/06/2013 1316   LDLCALC 73 10/01/2012 1308    Other Studies Reviewed Today:  01/06/13 EKG: sinus rhythm, poor R-wave progression, non-specific ST/T changes   01/2013 echo Study Conclusions  -  Left ventricle: The cavity size was normal. Wall thickness   was increased in a pattern of mild LVH. Systolic function   was normal. The estimated ejection fraction was in the   range of 60% to 65%. Wall motion was normal; there were no   regional wall motion abnormalities. Doppler parameters are   consistent with abnormal left ventricular relaxation   (grade 1 diastolic dysfunction). - Aortic valve: Trileaflet; mildly thickened leaflets. No   significant regurgitation. Mean gradient: 33mm Hg (S). - Mitral valve: Mildly thickened leaflets . Trivial   regurgitation. - Right atrium: Central venous pressure: 56mm Hg (est). - Atrial septum: No defect or patent foramen ovale was   identified. - Tricuspid valve: Physiologic regurgitation. - Pulmonary arteries: PA peak pressure: 66mm Hg (S). - Pericardium, extracardiac: There was no pericardial   effusion. Impressions:  - No prior study for comparison. Mild LVH with LVEF 60-65%,   grade 1 diastolic dysfunction. Mildly thickened aortic and   mitral valves, nosignificant regurgitant lesions. PASP 30   mm mercury. No pericardial effusion.   01/2013 Stress echo Study Conclusions  - Stress ECG conclusions: The stress ECG was negative for   ischemia. - Staged echo: There was no echocardiographic  evidence for   stress-induced ischemia.     Assessment and Plan:  1. Other chest pain   2. Essential hypertension   3. Mixed hyperlipidemia   4. History of CVA (cerebrovascular accident)   5. Stenosis of carotid artery, unspecified laterality   6. CAD in native artery    1. Other chest pain Previous history of chest pain.  Currently denies any anginal or exertional symptoms.    2. Essential hypertension Blood pressure was 135/69 at recent PCP visit in September.  She was continuing lisinopril 40 mg p.o. daily.  3. Mixed hyperlipidemia Lipid panel at PCP office on 12/06/2020: TC 151, TG 115, HDL 48, LDL 82.  Continue atorvastatin 40 mg p.o. daily.  4. History of CVA (cerebrovascular accident) CVA 2017 at last visit with Dr. Wyline Mood she had no focal neurological deficits.  Continue Plavix 75 mg p.o. daily.  5. Stenosis of carotid artery, unspecified laterality Prior carotid artery duplex study 08/02/2015 bilateral carotid bifurcation and proximal ICA plaque.  Resulting in less than 50% diameter stenosis.  Antegrade bilateral vertebral blood flow.  Continued surveillance recommended.  Please get a follow-up carotid artery duplex study for continued surveillance of carotid artery stenosis.  6. CAD in native artery / coronary artery calcifications on CT scan  CT of chest 06/20/2018 with evidence of moderate atherosclerotic changes noted throughout the intrathoracic aorta.  Scattered three-vessel coronary artery calcifications.  Denies any anginal or exertional symptoms.  Medication Adjustments/Labs and Tests Ordered: Current medicines are reviewed at length with the patient today.  Concerns regarding medicines are outlined above.   Disposition: Follow-up with Dr. Wyline Mood or APP 1 year  Signed, Rennis Harding, NP 01/20/2021 3:11 PM    [facility] at [facility] [address] [place], [place], [place] [address] Phone: [phone]; Fax: [phone]

## 2021-01-20 ENCOUNTER — Encounter: Payer: Self-pay | Admitting: Family Medicine

## 2021-01-20 ENCOUNTER — Ambulatory Visit: Payer: Medicare Other | Admitting: Family Medicine

## 2021-01-20 ENCOUNTER — Other Ambulatory Visit: Payer: Self-pay

## 2021-01-20 VITALS — BP 140/68 | HR 104 | Ht 66.0 in | Wt 185.0 lb

## 2021-01-20 DIAGNOSIS — I2510 Atherosclerotic heart disease of native coronary artery without angina pectoris: Secondary | ICD-10-CM | POA: Diagnosis not present

## 2021-01-20 DIAGNOSIS — I10 Essential (primary) hypertension: Secondary | ICD-10-CM

## 2021-01-20 DIAGNOSIS — Z8673 Personal history of transient ischemic attack (TIA), and cerebral infarction without residual deficits: Secondary | ICD-10-CM

## 2021-01-20 DIAGNOSIS — R0789 Other chest pain: Secondary | ICD-10-CM

## 2021-01-20 DIAGNOSIS — I6529 Occlusion and stenosis of unspecified carotid artery: Secondary | ICD-10-CM | POA: Diagnosis not present

## 2021-01-20 DIAGNOSIS — E782 Mixed hyperlipidemia: Secondary | ICD-10-CM

## 2021-01-20 NOTE — Patient Instructions (Signed)
Medication Instructions:  Your physician recommends that you continue on your current medications as directed. Please refer to the Current Medication list given to you today.  *If you need a refill on your cardiac medications before your next appointment, please call your pharmacy*   Lab Work: NONE   If you have labs (blood work) drawn today and your tests are completely normal, you will receive your results only by: MyChart Message (if you have MyChart) OR A paper copy in the mail If you have any lab test that is abnormal or we need to change your treatment, we will call you to review the results.   Testing/Procedures: Your physician has requested that you have a carotid duplex. This test is an ultrasound of the carotid arteries in your neck. It looks at blood flow through these arteries that supply the brain with blood. Allow one hour for this exam. There are no restrictions or special instructions.    Follow-Up: At [facility], you and your health needs are our priority.  As part of our continuing mission to provide you with exceptional heart care, we have created designated Provider Care Teams.  These Care Teams include your primary Cardiologist (physician) and Advanced Practice Providers (APPs -  Physician Assistants and Nurse Practitioners) who all work together to provide you with the care you need, when you need it.  We recommend signing up for the patient portal called "MyChart".  Sign up information is provided on this After Visit Summary.  MyChart is used to connect with patients for Virtual Visits (Telemedicine).  Patients are able to view lab/test results, encounter notes, upcoming appointments, etc.  Non-urgent messages can be sent to your provider as well.   To learn more about what you can do with MyChart, go to [email or web address].    Your next appointment:   1 year(s)  The format for your next appointment:   In Person  Provider:   Jonathan Branch, MD     Other Instructions Thank you for choosing [facility]!    

## 2021-01-22 ENCOUNTER — Other Ambulatory Visit: Payer: Self-pay | Admitting: Family Medicine

## 2021-02-02 ENCOUNTER — Ambulatory Visit: Payer: Medicare Other | Admitting: Family Medicine

## 2021-02-16 ENCOUNTER — Other Ambulatory Visit: Payer: Self-pay | Admitting: Family Medicine

## 2021-02-16 DIAGNOSIS — E1142 Type 2 diabetes mellitus with diabetic polyneuropathy: Secondary | ICD-10-CM

## 2021-03-04 ENCOUNTER — Ambulatory Visit: Payer: Medicare HMO | Admitting: Family Medicine

## 2021-03-11 ENCOUNTER — Other Ambulatory Visit: Payer: Self-pay | Admitting: Family Medicine

## 2021-03-11 DIAGNOSIS — E785 Hyperlipidemia, unspecified: Secondary | ICD-10-CM

## 2021-03-11 DIAGNOSIS — E039 Hypothyroidism, unspecified: Secondary | ICD-10-CM

## 2021-03-15 ENCOUNTER — Encounter: Payer: Self-pay | Admitting: Nurse Practitioner

## 2021-03-15 ENCOUNTER — Ambulatory Visit (INDEPENDENT_AMBULATORY_CARE_PROVIDER_SITE_OTHER): Payer: Medicare Other | Admitting: Nurse Practitioner

## 2021-03-15 VITALS — BP 140/76 | HR 94 | Temp 97.3°F | Ht 66.0 in | Wt 180.5 lb

## 2021-03-15 DIAGNOSIS — J069 Acute upper respiratory infection, unspecified: Secondary | ICD-10-CM | POA: Insufficient documentation

## 2021-03-15 MED ORDER — AMOXICILLIN-POT CLAVULANATE 875-125 MG PO TABS: 1.0000 | ORAL_TABLET | Freq: Two times a day (BID) | ORAL | 0 refills | Status: DC

## 2021-03-15 MED ORDER — DM-GUAIFENESIN ER 30-600 MG PO TB12: 1.0000 | ORAL_TABLET | Freq: Two times a day (BID) | ORAL | 0 refills | Status: DC

## 2021-03-15 MED ORDER — BENZONATATE 100 MG PO CAPS
100.0000 mg | ORAL_CAPSULE | Freq: Three times a day (TID) | ORAL | 0 refills | Status: DC | PRN
Start: 2021-03-15 — End: 2021-04-11

## 2021-03-15 NOTE — Patient Instructions (Signed)

## 2021-03-15 NOTE — Progress Notes (Signed)
Acute Office Visit  Subjective:    Patient ID: Laura Dalton, female    DOB: 30-Oct-1950, 71 y.o.   MRN: [id number]  Chief Complaint  Patient presents with   Cough   Nasal Congestion    URI  This is a recurrent problem. The current episode started 1 to 4 weeks ago. The problem has been gradually worsening. There has been no fever. Associated symptoms include congestion, coughing and sinus pain. Pertinent negatives include no headaches, sneezing, sore throat or swollen glands. She has tried decongestant and increased fluids for the symptoms. The treatment provided no relief.    Past Medical History:  Diagnosis Date   Bursitis of hip    Diabetes mellitus without complication ([place])    type II   Hyperlipidemia    Hypertension    Hypothyroidism    Low back pain    benign turmor back   Stroke ([place])    Vitamin D deficiency disease     Past Surgical History:  Procedure Laterality Date   APPENDECTOMY     BACK SURGERY  1996   turmor on back    CHOLECYSTECTOMY     LEG SURGERY Right    "clot and had drain in leg  rt lower leg    Family History  Problem Relation Age of Onset   Diabetes Mother    Stroke Mother    Seizures Mother    Cancer Father        prostate   COPD Sister    COPD Sister    COPD Sister    Cancer Brother        lung   Cancer Brother        prostate   COPD Brother    COPD Brother    Thyroid disease Son    Hashimoto's thyroiditis Son    Breast cancer Neg Hx     Social History   Socioeconomic History   Marital status: Widowed    Spouse name: Not on file   Number of children: 3   Years of education: 14   Highest education level: Associate degree: academic program  Occupational History   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   Children out of town   She depends on her neighbor a lot for yard work and to check on her daily   Social Determinants of Radio broadcast assistant Strain: Low Risk    Difficulty of Paying Living Expenses: Not hard at all  Food Insecurity: No Food Insecurity   Worried About Charity fundraiser in the Last Year: Never true   Arboriculturist in the Last Year: Never true  Transportation Needs: No Transportation Needs   Lack of Transportation (Medical): No   Lack of Transportation (Non-Medical): No  Physical Activity: Inactive   Days of Exercise per Week: 0 days  Minutes of Exercise per Session: 0 min  Stress: No Stress Concern Present   Feeling of Stress : Not at all  Social Connections: Moderately Integrated   Frequency of Communication with Friends and Family: More than three times a week   Frequency of Social Gatherings with Friends and Family: More than three times a week   Attends Religious Services: More than 4 times per year   Active Member of [employer] or Organizations: Yes   Attends Archivist Meetings: More than 4 times per year   Marital Status: Widowed  Human resources officer Violence: Not At Risk   Fear of Current or Ex-Partner: No   Emotionally Abused: No   Physically Abused: No   Sexually Abused: No    Outpatient Medications Prior to Visit  Medication Sig Dispense Refill   amitriptyline (ELAVIL) 100 MG tablet TAKE 1 TABLET BY MOUTH AT BEDTIME 90 tablet 0   atorvastatin (LIPITOR) 40 MG tablet Take 1 tablet by mouth once daily 90 tablet 0   clopidogrel (PLAVIX) 75 MG tablet Take 1 tablet by mouth once daily 90 tablet 0   fluticasone (FLONASE) 50 MCG/ACT nasal spray Place 2 sprays into both  nostrils daily. 16 g 6   levothyroxine (SYNTHROID) 88 MCG tablet TAKE 1 TABLET BY MOUTH ONCE DAILY BEFORE BREAKFAST 90 tablet 2   lisinopril (ZESTRIL) 40 MG tablet Take 1 tablet by mouth once daily 90 tablet 3   tiZANidine (ZANAFLEX) 4 MG tablet Take 1 tablet (4 mg total) by mouth every 8 (eight) hours as needed for muscle spasms. 30 tablet 5   triamcinolone cream (KENALOG) 0.1 % Apply 1 application topically 2 (two) times daily. 80 g 0   albuterol (VENTOLIN HFA) 108 (90 Base) MCG/ACT inhaler SMARTSIG:2 Puff(s) By Mouth Every 6 Hours PRN (Patient not taking: Reported on 01/11/2021)     calcium carbonate (OSCAL) 1500 (600 Ca) MG TABS tablet Take by mouth 2 (two) times daily with a meal. (Patient not taking: Reported on 03/15/2021)     No facility-administered medications prior to visit.    Allergies  Allergen Reactions   Aspirin Hives   Gabapentin Other (See Comments)    Dizzy,falls    Review of Systems  HENT:  Positive for congestion and sinus pain. Negative for sneezing and sore throat.   Respiratory:  Positive for cough.   Neurological:  Negative for headaches.  All other systems reviewed and are negative.     Objective:    Physical Exam Vitals and nursing note reviewed.  Constitutional:      Appearance: Normal appearance.  HENT:     Head: Normocephalic.     Right Ear: Ear canal and external ear normal.     Left Ear: Ear canal and external ear normal.     Nose: Congestion present.  Eyes:     Conjunctiva/sclera: Conjunctivae normal.  Cardiovascular:     Rate and Rhythm: Normal rate and regular rhythm.     Pulses: Normal pulses.     Heart sounds: Normal heart sounds.  Pulmonary:     Effort: Pulmonary effort is normal.     Breath sounds: Normal breath sounds.  Abdominal:     General: Bowel sounds are normal.  Musculoskeletal:        General: Normal range of motion.  Skin:    General: Skin is warm.     Findings: No rash.  Neurological:     Mental Status: She is alert  and oriented to person, place, and time.  Psychiatric:        Behavior: Behavior normal.    BP 140/76    Pulse 94    Temp (!) 97.3 F (36.3 C) (Temporal)    Ht 5' 6"  (1.676 m)    Wt 180 lb 8 oz (81.9 kg)    SpO2 97%    BMI 29.13 kg/m  Wt Readings from Last 3 Encounters:  03/15/21 180 lb 8 oz (81.9 kg)  01/20/21 185 lb (83.9 kg)  01/11/21 182 lb (82.6 kg)    Health Maintenance Due  Topic Date Due   Pneumonia Vaccine 41+ Years old (1 - PCV) Never done   COLONOSCOPY (Pts 45-34yr Insurance coverage will need to be confirmed)  Never done   Zoster Vaccines- Shingrix (1 of 2) Never done   DEXA SCAN  09/22/2018   COLON CANCER SCREENING ANNUAL FOBT  11/08/2018   FOOT EXAM  12/30/2020   OPHTHALMOLOGY EXAM  02/22/2021    There are no preventive care reminders to display for this patient.   Lab Results  Component Value Date   TSH 2.490 12/06/2020   Lab Results  Component Value Date   WBC 7.7 12/06/2020   HGB 12.6 12/06/2020   HCT 38.4 12/06/2020   MCV 82 12/06/2020   PLT 268 12/06/2020   Lab Results  Component Value Date   NA 143 12/06/2020   K 4.4 12/06/2020   CO2 19 (L) 12/06/2020   GLUCOSE 93 12/06/2020   BUN 7 (L) 12/06/2020   CREATININE 0.64 12/06/2020   BILITOT 0.3 12/06/2020   ALKPHOS 87 12/06/2020   AST 17 12/06/2020   ALT 6 12/06/2020   PROT 7.2 12/06/2020   ALBUMIN 4.9 (H) 12/06/2020   CALCIUM 9.6 12/06/2020   ANIONGAP 10 06/20/2018   EGFR 94 12/06/2020   Lab Results  Component Value Date   CHOL 151 12/06/2020   Lab Results  Component Value Date   HDL 48 12/06/2020   Lab Results  Component Value Date   LDLCALC 82 12/06/2020   Lab Results  Component Value Date   TRIG 115 12/06/2020   Lab Results  Component Value Date   CHOLHDL 3.1 12/06/2020   Lab Results  Component Value Date   HGBA1C 5.3 12/06/2020       Assessment & Plan:   Problem List Items Addressed This Visit       Respiratory   Upper respiratory tract infection - Primary    Relevant Medications   dextromethorphan-guaiFENesin (MUCINEX DM) 30-600 MG 12hr tablet   benzonatate (TESSALON PERLES) 100 MG capsule   amoxicillin-clavulanate (AUGMENTIN) 875-125 MG tablet  Take meds as prescribed - Use a cool mist humidifier  -Use saline nose sprays frequently -Force fluids -For fever or aches or pains- take Tylenol or ibuprofen. -Guaifenesin for cough and congestion -Benzonate for cough - Augmentin 875-125 mg tablet by mouth. -If symptoms do not improve, she may need to be COVID tested to rule this out Follow up with worsening unresolved symptoms    Meds ordered this encounter  Medications   dextromethorphan-guaiFENesin (MUCINEX DM) 30-600 MG 12hr tablet    Sig: Take 1 tablet by mouth 2 (two) times daily.    Dispense:  30 tablet    Refill:  0    Order Specific Question:   Supervising Provider    Answer:   SJeneen Rinks  benzonatate (TESSALON PERLES) 100 MG capsule    Sig: Take 1 capsule (100 mg total) by mouth 3 (three) times daily as  needed for cough.    Dispense:  20 capsule    Refill:  0    Order Specific Question:   Supervising Provider    Answer:   Claretta Fraise [[id number]]   amoxicillin-clavulanate (AUGMENTIN) 875-125 MG tablet    Sig: Take 1 tablet by mouth 2 (two) times daily.    Dispense:  14 tablet    Refill:  0    Order Specific Question:   Supervising Provider    Answer:   Claretta Fraise [[id number]]     Ivy Lynn, NP

## 2021-03-24 ENCOUNTER — Ambulatory Visit: Payer: Medicare Other

## 2021-03-24 ENCOUNTER — Other Ambulatory Visit: Payer: Self-pay

## 2021-03-24 DIAGNOSIS — I6529 Occlusion and stenosis of unspecified carotid artery: Secondary | ICD-10-CM

## 2021-04-11 ENCOUNTER — Ambulatory Visit (INDEPENDENT_AMBULATORY_CARE_PROVIDER_SITE_OTHER): Payer: Medicare Other

## 2021-04-11 ENCOUNTER — Ambulatory Visit (INDEPENDENT_AMBULATORY_CARE_PROVIDER_SITE_OTHER): Payer: Medicare Other | Admitting: Family Medicine

## 2021-04-11 ENCOUNTER — Encounter: Payer: Self-pay | Admitting: Family Medicine

## 2021-04-11 VITALS — BP 141/62 | HR 75 | Ht 66.0 in | Wt 184.0 lb

## 2021-04-11 DIAGNOSIS — Z1211 Encounter for screening for malignant neoplasm of colon: Secondary | ICD-10-CM | POA: Diagnosis not present

## 2021-04-11 DIAGNOSIS — M5441 Lumbago with sciatica, right side: Secondary | ICD-10-CM

## 2021-04-11 DIAGNOSIS — E1169 Type 2 diabetes mellitus with other specified complication: Secondary | ICD-10-CM | POA: Diagnosis not present

## 2021-04-11 DIAGNOSIS — Z780 Asymptomatic menopausal state: Secondary | ICD-10-CM | POA: Diagnosis not present

## 2021-04-11 DIAGNOSIS — E1159 Type 2 diabetes mellitus with other circulatory complications: Secondary | ICD-10-CM | POA: Diagnosis not present

## 2021-04-11 DIAGNOSIS — E039 Hypothyroidism, unspecified: Secondary | ICD-10-CM | POA: Diagnosis not present

## 2021-04-11 DIAGNOSIS — Z23 Encounter for immunization: Secondary | ICD-10-CM

## 2021-04-11 DIAGNOSIS — I152 Hypertension secondary to endocrine disorders: Secondary | ICD-10-CM

## 2021-04-11 DIAGNOSIS — G8929 Other chronic pain: Secondary | ICD-10-CM | POA: Diagnosis not present

## 2021-04-11 DIAGNOSIS — E1142 Type 2 diabetes mellitus with diabetic polyneuropathy: Secondary | ICD-10-CM | POA: Diagnosis not present

## 2021-04-11 DIAGNOSIS — E785 Hyperlipidemia, unspecified: Secondary | ICD-10-CM

## 2021-04-11 LAB — BAYER DCA HB A1C WAIVED: HB A1C (BAYER DCA - WAIVED): 5.8 % — ABNORMAL HIGH (ref 4.8–5.6)

## 2021-04-11 NOTE — Progress Notes (Signed)
BP (!) 157/63    Pulse 75    Ht 5' 6"  (1.676 m)    Wt 184 lb (83.5 kg)    SpO2 95%    BMI 29.70 kg/m    Subjective:   Patient ID: Laura Dalton, female    DOB: November 16, 1950, 71 y.o.   MRN: [id number]  HPI: Laura Dalton is a 71 y.o. female presenting on 04/11/2021 for Medical Management of Chronic Issues, Hypothyroidism, and Hypertension   HPI Type 2 diabetes mellitus Patient comes in today for recheck of his diabetes. Patient has been currently taking no medication and has been diet controlled, will check blood work today. Patient is currently on an ACE inhibitor/ARB. Patient has not seen an ophthalmologist this year. Patient denies any new issues with their feet. The symptom started onset as an adult neuropathy and hyperlipidemia and hypothyroidism ARE RELATED TO DM   Hyperlipidemia Patient is coming in for recheck of his hyperlipidemia. The patient is currently taking atorvastatin. They deny any issues with myalgias or history of liver damage from it. They deny any focal numbness or weakness or chest pain.   Hypothyroidism recheck Patient is coming in for thyroid recheck today as well. They deny any issues with hair changes or heat or cold problems or diarrhea or constipation. They deny any chest pain or palpitations. They are currently on levothyroxine 88 micrograms   Hypertension Patient is currently on lisinopril, and their blood pressure today is 157/63. Patient denies any lightheadedness or dizziness. Patient denies headaches, blurred vision, chest pains, shortness of breath, or weakness. Denies any side effects from medication and is content with current medication.   COPD Patient is coming in for COPD recheck today.  He is currently on albuterol as needed.  He has a mild chronic cough but denies any major coughing spells or wheezing spells.  He has 0nighttime symptoms per week and 0daytime symptoms per week currently.  She says mainly she gets exertional shortness of breath  but she says it is more because of her back pain and not because she is actually short of breath.  She denies any major issues today with the breathing.  Back pain Patient has continued back pain that has been chronic.  She has had a previous laminectomy there and she says it does hurt and she feels like she has a pinched nerve which shoots down her right leg.  Relevant past medical, surgical, family and social history reviewed and updated as indicated. Interim medical history since our last visit reviewed. Allergies and medications reviewed and updated.  Review of Systems  Constitutional:  Negative for chills and fever.  Eyes:  Negative for visual disturbance.  Respiratory:  Negative for chest tightness and shortness of breath.   Cardiovascular:  Negative for chest pain and leg swelling.  Musculoskeletal:  Positive for arthralgias, back pain and gait problem (Uses a cane).  Skin:  Negative for rash.  Neurological:  Negative for dizziness, light-headedness and headaches.  Psychiatric/Behavioral:  Negative for agitation and behavioral problems.   All other systems reviewed and are negative.  Per HPI unless specifically indicated above   Allergies as of 04/11/2021       Reactions   Aspirin Hives   Gabapentin Other (See Comments)   Dizzy,falls        Medication List        Accurate as of April 11, 2021  1:25 PM. If you have any questions, ask your nurse or doctor.  STOP taking these medications    amoxicillin-clavulanate 875-125 MG tablet Commonly known as: AUGMENTIN Stopped by: Fransisca Kaufmann Maddyson Keil, MD   benzonatate 100 MG capsule Commonly known as: Best boy Stopped by: Worthy Rancher, MD   dextromethorphan-guaiFENesin 30-600 MG 12hr tablet Commonly known as: [place] DM Stopped by: Fransisca Kaufmann Lesslie Mckeehan, MD   fluticasone 50 MCG/ACT nasal spray Commonly known as: FLONASE Stopped by: Worthy Rancher, MD       TAKE these medications     albuterol 108 (90 Base) MCG/ACT inhaler Commonly known as: VENTOLIN HFA   amitriptyline 100 MG tablet Commonly known as: ELAVIL TAKE 1 TABLET BY MOUTH AT BEDTIME   atorvastatin 40 MG tablet Commonly known as: LIPITOR Take 1 tablet by mouth once daily   calcium carbonate 1500 (600 Ca) MG Tabs tablet Commonly known as: OSCAL Take by mouth 2 (two) times daily with a meal.   clopidogrel 75 MG tablet Commonly known as: PLAVIX Take 1 tablet by mouth once daily   levothyroxine 88 MCG tablet Commonly known as: SYNTHROID TAKE 1 TABLET BY MOUTH ONCE DAILY BEFORE BREAKFAST   lisinopril 40 MG tablet Commonly known as: ZESTRIL Take 1 tablet by mouth once daily   tiZANidine 4 MG tablet Commonly known as: Zanaflex Take 1 tablet (4 mg total) by mouth every 8 (eight) hours as needed for muscle spasms.   triamcinolone cream 0.1 % Commonly known as: KENALOG Apply 1 application topically 2 (two) times daily.         Objective:   BP (!) 157/63    Pulse 75    Ht 5' 6"  (1.676 m)    Wt 184 lb (83.5 kg)    SpO2 95%    BMI 29.70 kg/m   Wt Readings from Last 3 Encounters:  04/11/21 184 lb (83.5 kg)  03/15/21 180 lb 8 oz (81.9 kg)  01/20/21 185 lb (83.9 kg)    Physical Exam Vitals and nursing note reviewed.  Constitutional:      General: She is not in acute distress.    Appearance: She is well-developed. She is not diaphoretic.  Eyes:     Conjunctiva/sclera: Conjunctivae normal.     Pupils: Pupils are equal, round, and reactive to light.  Cardiovascular:     Rate and Rhythm: Normal rate and regular rhythm.     Heart sounds: Normal heart sounds. No murmur heard. Pulmonary:     Effort: Pulmonary effort is normal. No respiratory distress.     Breath sounds: Normal breath sounds. No wheezing.  Musculoskeletal:        General: Tenderness present.  Skin:    General: Skin is warm and dry.     Findings: No rash.  Neurological:     Mental Status: She is alert and oriented to  person, place, and time.     Coordination: Coordination normal.  Psychiatric:        Behavior: Behavior normal.      Assessment & Plan:   Problem List Items Addressed This Visit       Cardiovascular and Mediastinum   Hypertension associated with diabetes ([place]) - Primary   Relevant Orders   Lipid panel     Endocrine   DM (diabetes mellitus) ([place])   Relevant Orders   CBC with Differential/Platelet   CMP14+EGFR   Bayer DCA Hb A1c Waived   Lipid panel   Hyperlipidemia associated with type 2 diabetes mellitus ([place])   Relevant Orders   Lipid panel   Diabetic peripheral  neuropathy associated with type 2 diabetes mellitus ([place])   Hypothyroidism   Relevant Orders   TSH     Other   Chronic back pain   Relevant Orders   Ambulatory referral to Orthopedic Surgery   Other Visit Diagnoses     Colon cancer screening       Relevant Orders   Cologuard   Postmenopausal       Relevant Orders   DG [facility] DEXA   Need for shingles vaccine       Relevant Orders   Varicella-zoster vaccine IM (Shingrix)       We will do blood work today.  Seems to be doing Well except for the back pain.  Blood pressure initially elevated, will do recheck, recheck was 141/62 looks good, continue current medicine  Patient is due for bone density scan Follow up plan: Return in about 3 months (around 07/10/2021), or if symptoms worsen or fail to improve, for Diabetes hypertension high cholesterol.  Counseling provided for all of the vaccine components Orders Placed This Encounter  Procedures   DG [facility] DEXA   Varicella-zoster vaccine IM (Shingrix)   Cologuard   CBC with Differential/Platelet   CMP14+EGFR   Bayer DCA Hb A1c Waived   Lipid panel   TSH   Ambulatory referral to [place] Juwuan Sedita, MD [facility] 04/11/2021, 1:25 PM

## 2021-04-12 DIAGNOSIS — M85851 Other specified disorders of bone density and structure, right thigh: Secondary | ICD-10-CM | POA: Diagnosis not present

## 2021-04-12 LAB — LIPID PANEL
Chol/HDL Ratio: 3 ratio (ref 0.0–4.4)
Cholesterol, Total: 128 mg/dL (ref 100–199)
HDL: 43 mg/dL (ref >39)
LDL Chol Calc (NIH): 65 mg/dL (ref 0–99)
Triglycerides: 106 mg/dL (ref 0–149)
VLDL Cholesterol Cal: 20 mg/dL (ref 5–40)

## 2021-04-12 LAB — CBC WITH DIFFERENTIAL/PLATELET
Basophils Absolute: 0 10*3/uL (ref 0.0–0.2)
Basos: 0 %
EOS (ABSOLUTE): 0.4 10*3/uL (ref 0.0–0.4)
Eos: 6 %
Hematocrit: 37.7 % (ref 34.0–46.6)
Hemoglobin: 12.4 g/dL (ref 11.1–15.9)
Immature Grans (Abs): 0 10*3/uL (ref 0.0–0.1)
Immature Granulocytes: 0 %
Lymphocytes Absolute: 1.5 10*3/uL (ref 0.7–3.1)
Lymphs: 22 %
MCH: 27.3 pg (ref 26.6–33.0)
MCHC: 32.9 g/dL (ref 31.5–35.7)
MCV: 83 fL (ref 79–97)
Monocytes Absolute: 0.8 10*3/uL (ref 0.1–0.9)
Monocytes: 11 %
Neutrophils Absolute: 4.2 10*3/uL (ref 1.4–7.0)
Neutrophils: 61 %
Platelets: 224 10*3/uL (ref 150–450)
RBC: 4.55 x10E6/uL (ref 3.77–5.28)
RDW: 13.1 % (ref 11.7–15.4)
WBC: 6.9 10*3/uL (ref 3.4–10.8)

## 2021-04-12 LAB — CMP14+EGFR
ALT: 7 IU/L (ref 0–32)
AST: 14 IU/L (ref 0–40)
Albumin/Globulin Ratio: 2.1 (ref 1.2–2.2)
Albumin: 4.6 g/dL (ref 3.7–4.7)
Alkaline Phosphatase: 79 IU/L (ref 44–121)
BUN/Creatinine Ratio: 9 — ABNORMAL LOW (ref 12–28)
BUN: 7 mg/dL — ABNORMAL LOW (ref 8–27)
Bilirubin Total: 0.2 mg/dL (ref 0.0–1.2)
CO2: 26 mmol/L (ref 20–29)
Calcium: 9.4 mg/dL (ref 8.7–10.3)
Chloride: 101 mmol/L (ref 96–106)
Creatinine, Ser: 0.82 mg/dL (ref 0.57–1.00)
Globulin, Total: 2.2 g/dL (ref 1.5–4.5)
Glucose: 100 mg/dL — ABNORMAL HIGH (ref 70–99)
Potassium: 4.1 mmol/L (ref 3.5–5.2)
Sodium: 140 mmol/L (ref 134–144)
Total Protein: 6.8 g/dL (ref 6.0–8.5)
eGFR: 76 mL/min/{1.73_m2} (ref >59)

## 2021-04-12 LAB — TSH: TSH: 4.59 u[IU]/mL — ABNORMAL HIGH (ref 0.450–4.500)

## 2021-04-14 ENCOUNTER — Other Ambulatory Visit: Payer: Self-pay | Admitting: Family Medicine

## 2021-04-14 MED ORDER — LEVOTHYROXINE SODIUM 100 MCG PO TABS: 100.0000 ug | ORAL_TABLET | Freq: Every day | ORAL | 3 refills | Status: DC

## 2021-04-14 MED ORDER — TIZANIDINE HCL 4 MG PO TABS: 4.0000 mg | ORAL_TABLET | Freq: Three times a day (TID) | ORAL | 5 refills | Status: DC | PRN

## 2021-05-02 DIAGNOSIS — H524 Presbyopia: Secondary | ICD-10-CM | POA: Diagnosis not present

## 2021-05-02 LAB — HM DIABETES EYE EXAM

## 2021-05-05 ENCOUNTER — Other Ambulatory Visit: Payer: Self-pay | Admitting: Cardiology

## 2021-05-05 ENCOUNTER — Other Ambulatory Visit: Payer: Self-pay | Admitting: Family Medicine

## 2021-05-27 DIAGNOSIS — H353132 Nonexudative age-related macular degeneration, bilateral, intermediate dry stage: Secondary | ICD-10-CM | POA: Diagnosis not present

## 2021-05-30 ENCOUNTER — Other Ambulatory Visit: Payer: Self-pay | Admitting: Family Medicine

## 2021-05-30 DIAGNOSIS — E1142 Type 2 diabetes mellitus with diabetic polyneuropathy: Secondary | ICD-10-CM

## 2021-06-02 ENCOUNTER — Other Ambulatory Visit: Payer: Self-pay | Admitting: Family Medicine

## 2021-06-02 DIAGNOSIS — E1142 Type 2 diabetes mellitus with diabetic polyneuropathy: Secondary | ICD-10-CM

## 2021-06-21 ENCOUNTER — Other Ambulatory Visit: Payer: Self-pay | Admitting: Family Medicine

## 2021-06-21 DIAGNOSIS — E1169 Type 2 diabetes mellitus with other specified complication: Secondary | ICD-10-CM

## 2021-07-13 ENCOUNTER — Ambulatory Visit (INDEPENDENT_AMBULATORY_CARE_PROVIDER_SITE_OTHER): Payer: Medicare Other | Admitting: Family Medicine

## 2021-07-13 ENCOUNTER — Encounter: Payer: Self-pay | Admitting: Family Medicine

## 2021-07-13 VITALS — BP 132/78 | HR 89 | Ht 66.0 in | Wt 186.0 lb

## 2021-07-13 DIAGNOSIS — Z23 Encounter for immunization: Secondary | ICD-10-CM | POA: Diagnosis not present

## 2021-07-13 DIAGNOSIS — E1142 Type 2 diabetes mellitus with diabetic polyneuropathy: Secondary | ICD-10-CM

## 2021-07-13 DIAGNOSIS — E785 Hyperlipidemia, unspecified: Secondary | ICD-10-CM

## 2021-07-13 DIAGNOSIS — E1169 Type 2 diabetes mellitus with other specified complication: Secondary | ICD-10-CM

## 2021-07-13 DIAGNOSIS — E1159 Type 2 diabetes mellitus with other circulatory complications: Secondary | ICD-10-CM

## 2021-07-13 DIAGNOSIS — E039 Hypothyroidism, unspecified: Secondary | ICD-10-CM | POA: Diagnosis not present

## 2021-07-13 DIAGNOSIS — I152 Hypertension secondary to endocrine disorders: Secondary | ICD-10-CM

## 2021-07-13 LAB — BAYER DCA HB A1C WAIVED: HB A1C (BAYER DCA - WAIVED): 5.9 % — ABNORMAL HIGH (ref 4.8–5.6)

## 2021-07-13 MED ORDER — ATORVASTATIN CALCIUM 40 MG PO TABS: 40.0000 mg | ORAL_TABLET | Freq: Every day | ORAL | 3 refills | Status: DC

## 2021-07-13 MED ORDER — CLOPIDOGREL BISULFATE 75 MG PO TABS
75.0000 mg | ORAL_TABLET | Freq: Every day | ORAL | 3 refills | Status: DC
Start: 2021-07-13 — End: 2022-07-03

## 2021-07-13 MED ORDER — AMITRIPTYLINE HCL 100 MG PO TABS: 100.0000 mg | ORAL_TABLET | Freq: Every day | ORAL | 3 refills | Status: DC

## 2021-07-13 NOTE — Progress Notes (Signed)
? ?BP 132/78   Pulse 89   Ht 5\' 6"  (1.676 m)   Wt 186 lb (84.4 kg)   SpO2 95%   BMI 30.02 kg/m?   ? ?Subjective:  ? ?Patient ID: Laura Dalton, female    DOB: Oct 01, 1950, 71 y.o.   MRN: [id number] ? ?HPI: ?Laura Dalton is a 71 y.o. female presenting on 07/13/2021 for Medical Management of Chronic Issues, Hypertension, Hypothyroidism, and Diabetes ? ? ?HPI ?Type 2 diabetes mellitus ?Patient comes in today for recheck of his diabetes. Patient has been currently taking no medicine, diet controlled. Patient is currently on an ACE inhibitor/ARB. Patient has not seen an ophthalmologist this year. Patient denies any issues with their feet. The symptom started onset as an adult htn and hld ARE RELATED TO DM  ? ?Hyperlipidemia ?Patient is coming in for recheck of his hyperlipidemia. The patient is currently taking atorvastatin. They deny any issues with myalgias or history of liver damage from it. They deny any focal numbness or weakness or chest pain.  ? ?Hypothyroidism recheck ?Patient is coming in for thyroid recheck today as well. They deny any issues with hair changes or heat or cold problems or diarrhea or constipation. They deny any chest pain or palpitations. They are currently on levothyroxine 100 micrograms  ? ?Hypertension ?Patient is currently on lisinopril, and their blood pressure today is 132/78. Patient denies any lightheadedness or dizziness. Patient denies headaches, blurred vision, chest pains, shortness of breath, or weakness. Denies any side effects from medication and is content with current medication.  ? ?Patient takes amitriptyline for her neuropathy pain associated with her back.  She says it is still helping and she was actually much better for a few weeks but recently has flared up a little bit again. ? ?Relevant past medical, surgical, family and social history reviewed and updated as indicated. Interim medical history since our last visit reviewed. ?Allergies and medications reviewed  and updated. ? ?Review of Systems  ?Constitutional:  Negative for chills and fever.  ?Eyes:  Negative for visual disturbance.  ?Respiratory:  Negative for chest tightness and shortness of breath.   ?Cardiovascular:  Negative for chest pain and leg swelling.  ?Musculoskeletal:  Positive for arthralgias and back pain. Negative for gait problem.  ?Skin:  Negative for rash.  ?Neurological:  Negative for dizziness, light-headedness and headaches.  ?Psychiatric/Behavioral:  Negative for agitation and behavioral problems.   ?All other systems reviewed and are negative. ? ?Per HPI unless specifically indicated above ? ? ?Allergies as of 07/13/2021   ? ?   Reactions  ? Aspirin Hives  ? Gabapentin Other (See Comments)  ? Dizzy,falls  ? ?  ? ?  ?Medication List  ?  ? ?  ? Accurate as of Jul 13, 2021 11:50 AM. If you have any questions, ask your nurse or doctor.  ?  ?  ? ?  ? ?albuterol 108 (90 Base) MCG/ACT inhaler ?Commonly known as: VENTOLIN HFA ?  ?amitriptyline 100 MG tablet ?Commonly known as: ELAVIL ?Take 1 tablet (100 mg total) by mouth at bedtime. ?  ?atorvastatin 40 MG tablet ?Commonly known as: LIPITOR ?Take 1 tablet (40 mg total) by mouth daily. ?  ?calcium carbonate 1500 (600 Ca) MG Tabs tablet ?Commonly known as: OSCAL ?Take by mouth 2 (two) times daily with a meal. ?  ?clopidogrel 75 MG tablet ?Commonly known as: PLAVIX ?Take 1 tablet (75 mg total) by mouth daily. ?  ?levothyroxine 100 MCG tablet ?Commonly known as:  SYNTHROID ?Take 1 tablet (100 mcg total) by mouth daily. ?  ?lisinopril 40 MG tablet ?Commonly known as: ZESTRIL ?Take 1 tablet by mouth once daily ?  ?tiZANidine 4 MG tablet ?Commonly known as: Zanaflex ?Take 1 tablet (4 mg total) by mouth every 8 (eight) hours as needed for muscle spasms. ?  ?triamcinolone cream 0.1 % ?Commonly known as: KENALOG ?Apply 1 application topically 2 (two) times daily. ?  ? ?  ? ? ? ?Objective:  ? ?BP 132/78   Pulse 89   Ht 5\' 6"  (1.676 m)   Wt 186 lb (84.4 kg)   SpO2  95%   BMI 30.02 kg/m?   ?Wt Readings from Last 3 Encounters:  ?07/13/21 186 lb (84.4 kg)  ?04/11/21 184 lb (83.5 kg)  ?03/15/21 180 lb 8 oz (81.9 kg)  ?  ?Physical Exam ?Vitals and nursing note reviewed.  ?Constitutional:   ?   General: She is not in acute distress. ?   Appearance: She is well-developed. She is not diaphoretic.  ?Eyes:  ?   Conjunctiva/sclera: Conjunctivae normal.  ?Cardiovascular:  ?   Rate and Rhythm: Normal rate and regular rhythm.  ?   Heart sounds: Normal heart sounds. No murmur heard. ?Pulmonary:  ?   Effort: Pulmonary effort is normal. No respiratory distress.  ?   Breath sounds: Normal breath sounds. No wheezing.  ?Musculoskeletal:     ?   General: No tenderness. Normal range of motion.  ?Skin: ?   General: Skin is warm and dry.  ?   Findings: No rash.  ?Neurological:  ?   Mental Status: She is alert and oriented to person, place, and time.  ?   Coordination: Coordination normal.  ?Psychiatric:     ?   Behavior: Behavior normal.  ? ? ? ? ?Assessment & Plan:  ? ?Problem List Items Addressed This Visit   ? ?  ? Cardiovascular and Mediastinum  ? Hypertension associated with diabetes ([facility])  ? Relevant Medications  ? atorvastatin (LIPITOR) 40 MG tablet  ?  ? Endocrine  ? DM (diabetes mellitus) ([facility])  ? Relevant Medications  ? atorvastatin (LIPITOR) 40 MG tablet  ? Other Relevant Orders  ? Bayer DCA Hb A1c Waived  ? Hyperlipidemia associated with type 2 diabetes mellitus ([facility])  ? Relevant Medications  ? atorvastatin (LIPITOR) 40 MG tablet  ? Diabetic peripheral neuropathy associated with type 2 diabetes mellitus ([facility])  ? Relevant Medications  ? amitriptyline (ELAVIL) 100 MG tablet  ? atorvastatin (LIPITOR) 40 MG tablet  ? Hypothyroidism - Primary  ? Relevant Orders  ? TSH  ? Dyslipidemia due to type 2 diabetes mellitus (HCC)  ? Relevant Medications  ? atorvastatin (LIPITOR) 40 MG tablet  ? ?Other Visit Diagnoses   ? ? Need for shingles vaccine      ? Relevant Orders  ? Varicella-zoster vaccine IM  (Shingrix) (Completed)  ? ?  ?  ?Continue current medicine, seems to be doing well.  A1c looks good at 5.9.   ? ?Follow up plan: ?Return in about 3 months (around 10/13/2021), or if symptoms worsen or fail to improve, for Diabetes and hypertension and cholesterol and thyroid. ? ?Counseling provided for all of the vaccine components ?Orders Placed This Encounter  ?Procedures  ? Varicella-zoster vaccine IM (Shingrix)  ? TSH  ? Bayer DCA Hb A1c Waived  ? ? ?12/13/2021, MD ?[facility] ?07/13/2021, 11:50 AM ? ? ? ? ?

## 2021-07-14 ENCOUNTER — Emergency Department (HOSPITAL_COMMUNITY): Payer: Medicare Other

## 2021-07-14 ENCOUNTER — Telehealth: Payer: Self-pay | Admitting: Family Medicine

## 2021-07-14 ENCOUNTER — Encounter (HOSPITAL_COMMUNITY): Payer: Self-pay

## 2021-07-14 ENCOUNTER — Other Ambulatory Visit: Payer: Self-pay

## 2021-07-14 ENCOUNTER — Emergency Department (HOSPITAL_COMMUNITY)
Admission: EM | Admit: 2021-07-14 | Discharge: 2021-07-14 | Disposition: A | Discharge status: Home / Self Care | Payer: Medicare Other | Attending: Emergency Medicine | Admitting: Emergency Medicine

## 2021-07-14 DIAGNOSIS — S32511A Fracture of superior rim of right pubis, initial encounter for closed fracture: Secondary | ICD-10-CM | POA: Diagnosis not present

## 2021-07-14 DIAGNOSIS — S32591A Other specified fracture of right pubis, initial encounter for closed fracture: Secondary | ICD-10-CM | POA: Diagnosis not present

## 2021-07-14 DIAGNOSIS — S32501A Unspecified fracture of right pubis, initial encounter for closed fracture: Secondary | ICD-10-CM | POA: Insufficient documentation

## 2021-07-14 DIAGNOSIS — W19XXXA Unspecified fall, initial encounter: Secondary | ICD-10-CM | POA: Diagnosis not present

## 2021-07-14 DIAGNOSIS — M2578 Osteophyte, vertebrae: Secondary | ICD-10-CM | POA: Diagnosis not present

## 2021-07-14 DIAGNOSIS — Y92481 Parking lot as the place of occurrence of the external cause: Secondary | ICD-10-CM | POA: Insufficient documentation

## 2021-07-14 DIAGNOSIS — S20211A Contusion of right front wall of thorax, initial encounter: Secondary | ICD-10-CM | POA: Diagnosis not present

## 2021-07-14 DIAGNOSIS — S79911A Unspecified injury of right hip, initial encounter: Secondary | ICD-10-CM | POA: Diagnosis present

## 2021-07-14 DIAGNOSIS — R0781 Pleurodynia: Secondary | ICD-10-CM | POA: Diagnosis not present

## 2021-07-14 DIAGNOSIS — M533 Sacrococcygeal disorders, not elsewhere classified: Secondary | ICD-10-CM | POA: Diagnosis not present

## 2021-07-14 DIAGNOSIS — M25551 Pain in right hip: Secondary | ICD-10-CM | POA: Diagnosis not present

## 2021-07-14 LAB — TSH: TSH: 3.95 u[IU]/mL (ref 0.450–4.500)

## 2021-07-14 MED ORDER — HYDROCODONE-ACETAMINOPHEN 5-325 MG PO TABS
1.0000 | ORAL_TABLET | Freq: Once | ORAL | Status: AC
  Administered 2021-07-14: 1 via ORAL
  Filled 2021-07-14: qty 1

## 2021-07-14 MED ORDER — OXYCODONE-ACETAMINOPHEN 5-325 MG PO TABS: 1.0000 | ORAL_TABLET | Freq: Four times a day (QID) | ORAL | 0 refills | Status: DC | PRN

## 2021-07-14 MED ORDER — OXYCODONE-ACETAMINOPHEN 5-325 MG PO TABS
1.0000 | ORAL_TABLET | Freq: Once | ORAL | Status: AC
  Administered 2021-07-14: 1 via ORAL
  Filled 2021-07-14: qty 1

## 2021-07-14 MED ORDER — OXYCODONE-ACETAMINOPHEN 5-325 MG PO TABS: 1.0000 | ORAL_TABLET | ORAL | 0 refills | Status: DC | PRN

## 2021-07-14 NOTE — ED Provider Notes (Signed)
?  Physical Exam  ?BP (!) 137/58 (BP Location: Left Arm)   Pulse 92   Temp 98.7 ?F (37.1 ?C) (Oral)   Resp 19   Ht 5\' 6"  (1.676 m)   Wt 83.9 kg   SpO2 99%   BMI 29.86 kg/m?  ? ?Physical Exam ? ?Procedures  ?Procedures ? ?ED Course / MDM  ?  ?Medical Decision Making ?Amount and/or Complexity of Data Reviewed ?Radiology: ordered. ? ?Risk ?Prescription drug management. ? ? ?Received patient in signout.  Fall.  No rib fractures seen but potentially could have occult rib fractures.  Will give pain medicine.  Also does have pubic rami fractures.  Pain does limit movement somewhat.  Plan with previous provider was to follow-up with PCP.  Family is discussed with PCP and they want home health set up through [place].  We will now consult transitions of care ? ? ? ? ?  ?Davonna Belling, MD ?07/14/21 1733 ? ?

## 2021-07-14 NOTE — ED Notes (Signed)
Patient transported to X-ray 

## 2021-07-14 NOTE — Discharge Instructions (Signed)
You have a fracture of the right pubic ramus.  Use a walker to help you ambulate.  Rest is much as possible.  Call your PCP for follow-up appointment as soon as possible.  We sent a prescription for pain reliever to use to help the discomfort.  Be careful when walking, and use the walker always.  Return here, if needed for problems ?

## 2021-07-14 NOTE — ED Notes (Signed)
Pt fell while trying to close the car door which pt has trouble with opening and closing the car door.  Pt states she landed on her right side.   Denies hitting her head. C/o right groin and under right breast pain since fall.  ?

## 2021-07-14 NOTE — Telephone Encounter (Signed)
Pt is currently at the hospital. She would like to stay with her daughter who lives in [place] during rehab/home health. Explained that the hospital will set up home health/rehab. Then pt will follow up with [place]. ?

## 2021-07-14 NOTE — Telephone Encounter (Signed)
Okay thanks for letting us know, tell her that we will be following up with her after but she would likely have to go to rehab before she comes to [place].  Just have her follow-up with [place] after she gets out of rehab ?

## 2021-07-14 NOTE — ED Notes (Signed)
Pt. returned from XR. 

## 2021-07-14 NOTE — Progress Notes (Signed)
Transition of Care [facility]) - Emergency Department Mini Assessment ? ? ?Patient Details  ?Name: Laura Dalton ?MRN: [id number] ?Date of Birth: 06/02/1950 ? ?Transition of Care (TOC) CM/SW Contact:    ?Laura Atlas, RN ?Phone Number: ?07/14/2021, 6:48 PM ? ? ?Clinical Narrative: ?Patient presented to [facility] ED due to a fall. RNCM received TOC consult for HHC: PT, OT, HHA.  ? ? ?ED Mini Assessment: ? RNCM spoke with patient's daughter Laura Dalton who states patient will be residing with her at [address] [place] [address]. Laura Dalton's address will be where [facility] services need to be set up. Due to after hours patient RNCM will follow up with [facility] agencies for acceptance. Patient had HHC services previously and unsure which agency. Patient does not have a preference of HHC agency, wants who will accept insurance. HHC orders are in for PT, OT, HHA.  ?RNCM advised patient's daughter Laura Dalton that if a [facility] agency accepts they will call within 24/48 hours. If they have not heard from any [facility] agencies within that timeframe she can contact PCP to have them set up [facility] services. Laura Dalton agreed with plan.   ?RNCM referred to multiple [facility] agencies awaiting a response.  ? ?TOC will continue to follow. ?Patient Contact and Communications ?  ? Evans,Laura Dalton (Daughter)  ?[phone] (Mobile) ?  ?Admission diagnosis:  Fall ?Patient Active Problem List  ? Diagnosis Date Noted  ? Obesity (BMI 30.0-34.9) 03/12/2015  ? Hypothyroidism 07/29/2014  ? Osteoarthritis of right shoulder 01/21/2014  ? Leg length discrepancy 11/04/2013  ? Lumbar post-laminectomy syndrome 11/04/2013  ? Diabetic peripheral neuropathy associated with type 2 diabetes mellitus (HCC) 11/04/2013  ? COPD (chronic obstructive pulmonary disease) (HCC) 10/01/2012  ? Chronic back pain 10/01/2012  ? Hypertension associated with diabetes (HCC) 10/01/2012  ? DM (diabetes mellitus) (HCC) 10/01/2012  ? Vitamin D deficiency 10/01/2012  ? Hyperlipidemia associated with type 2 diabetes mellitus  (HCC) 10/01/2012  ? Dyslipidemia due to type 2 diabetes mellitus (HCC) 10/01/2012  ? ?PCP:  Dalton, Laura Radon, MD ?Pharmacy:   ?[facility] [address] [place] - [address] ?[address] ?[place] [address] ?Phone: [phone] Fax: [phone] ?  ?

## 2021-07-14 NOTE — ED Provider Notes (Signed)
?[facility]PART[facility] ?Provider Note ? ? ?CSN: [id number] ?Arrival date & time: 07/14/21  1207 ? ?  ? ?History ? ?Chief Complaint  ?Patient presents with  ? Fall  ? ? ?Laura Dalton is a 71 y.o. female. ? ?HPI ?Patient presents for injury to right side after fell while using a car door.  She reports pain in her right ribs and right leg.  She was able to get up and ambulate, with help of friend.  She came here by private vehicle.  She denies headache or neck pain. ?  ? ?Home Medications ?Prior to Admission medications   ?Medication Sig Start Date End Date Taking? Authorizing Provider  ?oxyCODONE-acetaminophen (PERCOCET) 5-325 MG tablet Take 1 tablet by mouth every 4 (four) hours as needed for severe pain. 07/14/21  Yes Mancel Bale, MD  ?albuterol (VENTOLIN HFA) 108 (90 Base) MCG/ACT inhaler  10/09/20   [provider]  ?amitriptyline (ELAVIL) 100 MG tablet Take 1 tablet (100 mg total) by mouth at bedtime. 07/13/21   Dettinger, Elige Radon, MD  ?atorvastatin (LIPITOR) 40 MG tablet Take 1 tablet (40 mg total) by mouth daily. 07/13/21   Dettinger, Elige Radon, MD  ?calcium carbonate (OSCAL) 1500 (600 Ca) MG TABS tablet Take by mouth 2 (two) times daily with a meal.    [provider]  ?clopidogrel (PLAVIX) 75 MG tablet Take 1 tablet (75 mg total) by mouth daily. 07/13/21   Dettinger, Elige Radon, MD  ?levothyroxine (SYNTHROID) 100 MCG tablet Take 1 tablet (100 mcg total) by mouth daily. 04/14/21   Dettinger, Elige Radon, MD  ?lisinopril (ZESTRIL) 40 MG tablet Take 1 tablet by mouth once daily 05/05/21   Antoine Poche, MD  ?tiZANidine (ZANAFLEX) 4 MG tablet Take 1 tablet (4 mg total) by mouth every 8 (eight) hours as needed for muscle spasms. 04/14/21   Dettinger, Elige Radon, MD  ?triamcinolone cream (KENALOG) 0.1 % Apply 1 application topically 2 (two) times daily. 08/01/18   Dettinger, Elige Radon, MD  ?   ? ?Allergies    ?Aspirin and Gabapentin   ? ?Review of Systems   ?Review of Systems ? ?Physical  Exam ?Updated Vital Signs ?BP (!) 141/54 (BP Location: Left Arm)   Pulse 98   Temp 98.7 ?F (37.1 ?C) (Oral)   Resp 18   Ht 5\' 6"  (1.676 m)   Wt 83.9 kg   SpO2 98%   BMI 29.86 kg/m?  ?Physical Exam ?Vitals and nursing note reviewed.  ?Constitutional:   ?   General: She is not in acute distress. ?   Appearance: She is well-developed. She is not ill-appearing or diaphoretic.  ?HENT:  ?   Head: Normocephalic and atraumatic.  ?   Right Ear: External ear normal.  ?   Left Ear: External ear normal.  ?   Mouth/Throat:  ?   Mouth: Mucous membranes are moist.  ?Eyes:  ?   Conjunctiva/sclera: Conjunctivae normal.  ?   Pupils: Pupils are equal, round, and reactive to light.  ?Neck:  ?   Trachea: Phonation normal.  ?Cardiovascular:  ?   Rate and Rhythm: Normal rate.  ?Pulmonary:  ?   Effort: Pulmonary effort is normal.  ?Chest:  ?   Chest wall: Tenderness (Tender right lower chest wall without crepitation or deformity) present.  ?Abdominal:  ?   General: There is no distension.  ?   Palpations: Abdomen is soft.  ?   Tenderness: There is no abdominal tenderness.  ?Musculoskeletal:  ?  Cervical back: Normal range of motion and neck supple.  ?   Comments: While seated on table, I can passively mobilize the right hip without significant pain.  She guards against flexion of the right hip, actively.  ?Skin: ?   General: Skin is warm and dry.  ?Neurological:  ?   Mental Status: She is alert and oriented to person, place, and time.  ?   Cranial Nerves: No cranial nerve deficit.  ?   Sensory: No sensory deficit.  ?   Motor: No abnormal muscle tone.  ?   Coordination: Coordination normal.  ?Psychiatric:     ?   Mood and Affect: Mood normal.     ?   Behavior: Behavior normal.     ?   Thought Content: Thought content normal.     ?   Judgment: Judgment normal.  ? ? ?ED Results / Procedures / Treatments   ?Labs ?(all labs ordered are listed, but only abnormal results are displayed) ?Labs Reviewed - No data to  display ? ?EKG ?None ? ?Radiology ?DG Ribs Unilateral W/Chest Right ? ?Result Date: 07/14/2021 ?CLINICAL DATA:  Right rib pain after fall today. EXAM: RIGHT RIBS AND CHEST - 3+ VIEW COMPARISON:  October 23, 2017. FINDINGS: No fracture or other bone lesions are seen involving the ribs. There is no evidence of pneumothorax or pleural effusion. Both lungs are clear. Heart size and mediastinal contours are within normal limits. IMPRESSION: Negative. Electronically Signed   By: Lupita Raider M.D.   On: 07/14/2021 14:25  ? ?DG Hip Unilat With Pelvis 2-3 Views Right ? ?Result Date: 07/14/2021 ?CLINICAL DATA:  Pain.  Fell today.  Right groin pain. EXAM: DG HIP (WITH OR WITHOUT PELVIS) 2-3V RIGHT COMPARISON:  Pelvis and right hip radiographs 08/15/2013; CT abdomen and pelvis 06/20/2018 FINDINGS: Mild bilateral sacroiliac joint subchondral sclerosis degenerative change. Severe L3-4 through L5-S1 disc space narrowing and endplate osteophytes with L3-4 and L5-S1 disc vacuum phenomenon as on 06/20/2018 CT. Mild bilateral superior femoroacetabular joint space narrowing. There is new mild approximate 2 mm cortical step-off of the superior right pubic ramus suspicious for an acute fracture. No definitive additional acute fracture is identified however there is very subtle linear lucency seen within the junction of the right inferior pubic ramus in the right pubic body. IMPRESSION: Minimally displaced acute fracture of the superior right pubic ramus. Although no definitive additional acute fracture is seen, there would be high suspicion for a second fracture within the same bony ring, and there is very subtle linear lucency within the medial aspect of the right inferior pubic ramus/pubic body that could represent an additional nondisplaced acute fracture. Electronically Signed   By: Neita Garnet M.D.   On: 07/14/2021 14:27   ? ?Procedures ?Procedures  ? ? ?Medications Ordered in ED ?Medications  ?HYDROcodone-acetaminophen  (NORCO/VICODIN) 5-325 MG per tablet 1 tablet (1 tablet Oral Given 07/14/21 1348)  ?oxyCODONE-acetaminophen (PERCOCET/ROXICET) 5-325 MG per tablet 1 tablet (1 tablet Oral Given 07/14/21 1533)  ? ? ?ED Course/ Medical Decision Making/ A&P ?  ?                        ?Medical Decision Making ?Patient presenting for injuries from mechanical fall when she was attempting to exit her car.  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 ? ?Problems Addressed: ?Closed fracture of right  pubis, unspecified portion of pubis, initial encounter [facility](HCC): acute illness or injury ?Contusion of right chest wall, initial encounter: acute illness or injury ? ?Amount and/or Complexity of Data Reviewed ?Independent Historian:  ?   Details: She is a cogent historian ?Radiology: ordered and independent interpretation performed. ?   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 ? ?Risk ?Prescription drug management. ?Decision regarding hospitalization. ?Risk Details: Patient presenting with injury from fall, which was mechanical.  She has a nonoperative pelvic fracture.  Suspect right rib contusion, without fracture.  Patient has a walker at home.  She prefers to go home and does not want PT ordered at home.  She will discuss ongoing management with her PCP.  Prescription given to patient for discharge use for of narcotic.  Patient's daughter is coming to pick her up and will take her to her home for assistance with care and treatment. ? ? ? ? ? ? ? ? ? ? ?Final Clinical Impression(s) / ED Diagnoses ?Final diagnoses:  ?Closed fracture of right pubis, unspecified portion of pubis, initial encounter (HCC)  ?Contusion of right chest wall, initial encounter  ? ? ?Rx / DC Orders ?ED Discharge Orders   ? ?      Ordered  ?  oxyCODONE-acetaminophen  (PERCOCET) 5-325 MG tablet  Every 4 hours PRN       ? 07/14/21 1530  ?  Home Health       ? 07/14/21 1735  ?  Face-to-face encounter (required for Medicare/Medicaid patients)       ?Comments: I Benjiman Coreathan Pickering certify that this patient is under my care and

## 2021-07-14 NOTE — ED Triage Notes (Signed)
Pt reports fall in parking lot earlier while trying to get out of the car.  Reports pain to right rib area and R leg.  No deformities noted.  Resp even and unlabored.  WC to triage.  ?

## 2021-07-15 ENCOUNTER — Encounter (INDEPENDENT_AMBULATORY_CARE_PROVIDER_SITE_OTHER): Payer: Self-pay

## 2021-07-15 ENCOUNTER — Telehealth: Payer: Self-pay

## 2021-07-15 ENCOUNTER — Encounter: Payer: Self-pay | Admitting: Family Medicine

## 2021-07-15 MED FILL — Oxycodone w/ Acetaminophen Tab 5-325 MG: ORAL | Qty: 6 | Status: AC

## 2021-07-15 NOTE — Telephone Encounter (Signed)
RNCM received notification from [place] with [facility] that they will accept patient for [facility] services. Patient's daughter was notified that [facility] agency will contact them within 24-48 hours.  ?

## 2021-07-19 ENCOUNTER — Encounter: Payer: Self-pay | Admitting: Family Medicine

## 2021-07-19 ENCOUNTER — Telehealth: Payer: Self-pay

## 2021-07-19 DIAGNOSIS — G8929 Other chronic pain: Secondary | ICD-10-CM

## 2021-07-19 NOTE — ED Notes (Signed)
Family reached out stating that [facility] company has not been in contact with the family to set up appointment. CSW reached out to [facility] rep Bonita Quin to check in on case. Bonita Quin states she will reach out to the [place] office as [place] is within their coverage area.  ?

## 2021-07-19 NOTE — Telephone Encounter (Signed)
RNCM spoke with patient's daughter Theadora Rama who confirmed she spoke with [facility] and they will see patient on Sunday or sooner if earlier date becomes available.  ? ?No additional TOC needs at this time.  ?

## 2021-07-19 NOTE — Telephone Encounter (Signed)
RNCM received inbound call from patient's daughter Theadora Rama [phone] stating noone from [facility] agency has contacted her. Patient's daughter states PCP will not see patient for ED follow up until she has [facility] services. Patient's PCP was scheduled on 07/21/21 however has now been rescheduled to 07/28/21.  ? ?RNCM contacted [place] to provide daughter's phone and get follow up. RNCM awaiting a response from [facility]. RNCM advised [place] will follow up. ? ?TOC will continue to follow.  ?

## 2021-07-24 DIAGNOSIS — S32501A Unspecified fracture of right pubis, initial encounter for closed fracture: Secondary | ICD-10-CM | POA: Diagnosis not present

## 2021-07-25 DIAGNOSIS — S32501A Unspecified fracture of right pubis, initial encounter for closed fracture: Secondary | ICD-10-CM | POA: Diagnosis not present

## 2021-07-27 DIAGNOSIS — S32501A Unspecified fracture of right pubis, initial encounter for closed fracture: Secondary | ICD-10-CM | POA: Diagnosis not present

## 2021-07-28 ENCOUNTER — Telehealth: Payer: Self-pay | Admitting: Family Medicine

## 2021-07-28 ENCOUNTER — Encounter: Payer: Self-pay | Admitting: Family Medicine

## 2021-07-28 ENCOUNTER — Ambulatory Visit (INDEPENDENT_AMBULATORY_CARE_PROVIDER_SITE_OTHER): Payer: Medicare Other

## 2021-07-28 ENCOUNTER — Ambulatory Visit (INDEPENDENT_AMBULATORY_CARE_PROVIDER_SITE_OTHER): Payer: Medicare Other | Admitting: Family Medicine

## 2021-07-28 VITALS — BP 122/62 | HR 78 | Ht 66.0 in | Wt 184.0 lb

## 2021-07-28 DIAGNOSIS — S20211S Contusion of right front wall of thorax, sequela: Secondary | ICD-10-CM | POA: Diagnosis not present

## 2021-07-28 DIAGNOSIS — S32511A Fracture of superior rim of right pubis, initial encounter for closed fracture: Secondary | ICD-10-CM | POA: Diagnosis not present

## 2021-07-28 DIAGNOSIS — R8290 Unspecified abnormal findings in urine: Secondary | ICD-10-CM | POA: Diagnosis not present

## 2021-07-28 DIAGNOSIS — Z029 Encounter for administrative examinations, unspecified: Secondary | ICD-10-CM

## 2021-07-28 DIAGNOSIS — S32501A Unspecified fracture of right pubis, initial encounter for closed fracture: Secondary | ICD-10-CM

## 2021-07-28 NOTE — Telephone Encounter (Signed)
Yes lets go ahead and do an note for her, I forgot to do this during the visit but we can send her a note saying that she can be off for a week and that we are doing the FMLA to write her out for 6 weeks.

## 2021-07-28 NOTE — Progress Notes (Signed)
BP 122/62   Pulse 78   Ht 5\' 6"  (1.676 m)   Wt 184 lb (83.5 kg)   SpO2 97%   BMI 29.70 kg/m    Subjective:   Patient ID: , female    DOB: 25-Jan-1951, 71 y.o.   MRN: [id number]  HPI: Laura Dalton is a 71 y.o. female presenting on 07/28/2021 for Fracture Right hip (Has PT/OT weekly- daughter needs FMLA completed)   HPI Right hip fracture Patient is coming in for right hip fracture.  Patient had a fall on 07/14/2021 where her legs just gave out because of balance or weakness.  She cannot give a specific reason.  She says she was not lightheaded or dizzy but just felt weak and her legs gave out on her. She just started rehab yesterday at home and Occupational Therapy.  She did not go to a rehab facility has a plan to do this at home and she is staying with her daughter currently.  She says she has some pain from the rehab itself but the oxycodone made her too loopy and her daughter thinks is better to stick with the Tylenol and ibuprofen which they are using.  Dysuria and urine odor Patient is having abnormal urinary odor.  Daughter noticed that she has been having some urinary frequency and urinary odor and wanted to get it checked.  She denies of having any fevers or chills or abdominal pain.  Relevant past medical, surgical, family and social history reviewed and updated as indicated. Interim medical history since our last visit reviewed. Allergies and medications reviewed and updated.  Review of Systems  Constitutional:  Negative for chills and fever.  HENT:  Negative for congestion, ear discharge and ear pain.   Eyes:  Negative for redness and visual disturbance.  Respiratory:  Negative for chest tightness and shortness of breath.   Cardiovascular:  Negative for chest pain and leg swelling.  Genitourinary:  Positive for dysuria and urgency. Negative for difficulty urinating, dyspareunia, vaginal bleeding, vaginal discharge and vaginal pain.  Musculoskeletal:   Positive for arthralgias. Negative for back pain and gait problem.  Skin:  Negative for rash.  Neurological:  Negative for light-headedness and headaches.  Psychiatric/Behavioral:  Negative for agitation and behavioral problems.   All other systems reviewed and are negative.  Per HPI unless specifically indicated above   Allergies as of 07/28/2021       Reactions   Aspirin Hives   Gabapentin Other (See Comments)   Dizzy,falls        Medication List        Accurate as of Jul 28, 2021 10:53 AM. If you have any questions, ask your nurse or doctor.          albuterol 108 (90 Base) MCG/ACT inhaler Commonly known as: VENTOLIN HFA   amitriptyline 100 MG tablet Commonly known as: ELAVIL Take 1 tablet (100 mg total) by mouth at bedtime.   atorvastatin 40 MG tablet Commonly known as: LIPITOR Take 1 tablet (40 mg total) by mouth daily.   calcium carbonate 1500 (600 Ca) MG Tabs tablet Commonly known as: OSCAL Take by mouth 2 (two) times daily with a meal.   clopidogrel 75 MG tablet Commonly known as: PLAVIX Take 1 tablet (75 mg total) by mouth daily.   levothyroxine 100 MCG tablet Commonly known as: SYNTHROID Take 1 tablet (100 mcg total) by mouth daily.   lisinopril 40 MG tablet Commonly known as: ZESTRIL Take 1 tablet by  mouth once daily   oxyCODONE-acetaminophen 5-325 MG tablet Commonly known as: PERCOCET/ROXICET Take 1 tablet by mouth every 6 (six) hours as needed for severe pain.   oxyCODONE-acetaminophen 5-325 MG tablet Commonly known as: Percocet Take 1 tablet by mouth every 4 (four) hours as needed for severe pain.   tiZANidine 4 MG tablet Commonly known as: Zanaflex Take 1 tablet (4 mg total) by mouth every 8 (eight) hours as needed for muscle spasms.   triamcinolone cream 0.1 % Commonly known as: KENALOG Apply 1 application topically 2 (two) times daily.         Objective:   BP 122/62   Pulse 78   Ht 5\' 6"  (1.676 m)   Wt 184 lb (83.5 kg)    SpO2 97%   BMI 29.70 kg/m   Wt Readings from Last 3 Encounters:  07/28/21 184 lb (83.5 kg)  07/14/21 185 lb (83.9 kg)  07/13/21 186 lb (84.4 kg)    Physical Exam Vitals and nursing note reviewed.  Constitutional:      General: She is not in acute distress.    Appearance: She is well-developed. She is not diaphoretic.  Eyes:     Conjunctiva/sclera: Conjunctivae normal.  Cardiovascular:     Rate and Rhythm: Normal rate and regular rhythm.     Heart sounds: Normal heart sounds. No murmur heard. Pulmonary:     Effort: Pulmonary effort is normal. No respiratory distress.     Breath sounds: Normal breath sounds. No wheezing.  Musculoskeletal:        General: No swelling or tenderness. Normal range of motion.     Right hip: No tenderness or bony tenderness. Normal range of motion.     Left hip: No tenderness or bony tenderness. Normal range of motion.  Skin:    General: Skin is warm and dry.     Findings: No rash.  Neurological:     Mental Status: She is alert and oriented to person, place, and time.     Coordination: Coordination normal.  Psychiatric:        Behavior: Behavior normal.    Hip x-ray: Await final read from radiology   Assessment & Plan:   Problem List Items Addressed This Visit   None Visit Diagnoses     Closed fracture of right pubis, unspecified portion of pubis, initial encounter (HCC)    -  Primary   Relevant Orders   DG Pelvis 1-2 Views   Ambulatory referral to Orthopedic Surgery   Chest wall contusion, right, sequela       Abnormal urine odor       Relevant Orders   Urinalysis, Complete   Urine Culture       Patient has physical therapy and Occupational Therapy at least once per week.  The daughter is staying with her who is from [place] and is staying with her to help take care of her until she can get on her feet again.  Will await urine test and call patient back with results. Follow up plan: Return if symptoms worsen or fail to  improve.  Counseling provided for all of the vaccine components Orders Placed This Encounter  Procedures   Urine Culture   DG Pelvis 1-2 Views   Urinalysis, Complete   Ambulatory referral to Orthopedic Surgery    [place], MD [facility] 07/28/2021, 10:53 AM

## 2021-07-29 LAB — URINALYSIS, COMPLETE
Bilirubin, UA: NEGATIVE
Glucose, UA: NEGATIVE
Ketones, UA: NEGATIVE
Nitrite, UA: NEGATIVE
Protein,UA: NEGATIVE
RBC, UA: NEGATIVE
Specific Gravity, UA: 1.015 (ref 1.005–1.030)
Urobilinogen, Ur: 0.2 mg/dL (ref 0.2–1.0)
pH, UA: 5.5 (ref 5.0–7.5)

## 2021-07-29 LAB — URINE CULTURE

## 2021-07-29 NOTE — Telephone Encounter (Signed)
Yes go ahead and do a note for her, we are working on the [facility] but go ahead and do a note for her writing her out and saying that we are working on her FMLA, you can write her out for at least a week while we are working on her [employer]

## 2021-07-29 NOTE — Telephone Encounter (Signed)
Letter has been sent through Mychart. Advised daughter to give the office a call back to let us know if she would like to come by and pick up as well.

## 2021-07-30 ENCOUNTER — Encounter: Payer: Self-pay | Admitting: Family Medicine

## 2021-08-01 ENCOUNTER — Ambulatory Visit (INDEPENDENT_AMBULATORY_CARE_PROVIDER_SITE_OTHER): Payer: Medicare Other | Admitting: Family Medicine

## 2021-08-01 ENCOUNTER — Encounter: Payer: Self-pay | Admitting: Family Medicine

## 2021-08-01 DIAGNOSIS — J208 Acute bronchitis due to other specified organisms: Secondary | ICD-10-CM

## 2021-08-01 DIAGNOSIS — B9689 Other specified bacterial agents as the cause of diseases classified elsewhere: Secondary | ICD-10-CM

## 2021-08-01 DIAGNOSIS — S32501A Unspecified fracture of right pubis, initial encounter for closed fracture: Secondary | ICD-10-CM | POA: Diagnosis not present

## 2021-08-01 MED ORDER — CEFDINIR 300 MG PO CAPS: 300.0000 mg | ORAL_CAPSULE | Freq: Two times a day (BID) | ORAL | 0 refills | Status: AC

## 2021-08-01 MED ORDER — ALBUTEROL SULFATE HFA 108 (90 BASE) MCG/ACT IN AERS: INHALATION_SPRAY | RESPIRATORY_TRACT | 0 refills | Status: DC

## 2021-08-01 NOTE — Progress Notes (Signed)
Telephone visit  Subjective: [id number] PCP: Dettinger, Elige Radon, MD CHY:IFOYD Laura Dalton is a 71 y.o. female calls for telephone consult today. Patient provides verbal consent for consult held via phone.  Due to COVID-19 pandemic this visit was conducted virtually. This visit type was conducted due to national recommendations for restrictions regarding the COVID-19 Pandemic (e.g. social distancing, sheltering in place) in an effort to limit this patient's exposure and mitigate transmission in our community. All issues noted in this document were discussed and addressed.  A physical exam was not performed with this format.   Location of patient: home Location of provider: [facility] Others present for call: [name]  1. Congestion Daughter reports chest congestion. Cough is worse with eating.  Giving robitussin with minimal improvement.  She is wheezing.  No COPD, asthma.  Nonsmoker.  No known sick contacts. Symptoms onset 5/9 after she started residing with her daughter out in [place] after her injury.  She has a fractured pelvis and had plain films of the chest looking at unilateral right-sided ribs because she had had some trauma to that area as well.  There was no reported fracture on that side.  It sounds like she has a contusion but she is been having some splinted breathing ever since then.  They have not completed a COVID-19 test but her daughter will do so right now.    ROS: Per HPI  Allergies  Allergen Reactions   Aspirin Hives   Gabapentin Other (See Comments)    Dizzy,falls   Past Medical History:  Diagnosis Date   Bursitis of hip    Diabetes mellitus without complication (HCC)    type II   Hyperlipidemia    Hypertension    Hypothyroidism    Low back pain    benign turmor back   Stroke (HCC)    Vitamin D deficiency disease     Current Outpatient Medications:    albuterol (VENTOLIN HFA) 108 (90 Base) MCG/ACT inhaler, , Disp: , Rfl:    amitriptyline (ELAVIL) 100 MG  tablet, Take 1 tablet (100 mg total) by mouth at bedtime., Disp: 90 tablet, Rfl: 3   atorvastatin (LIPITOR) 40 MG tablet, Take 1 tablet (40 mg total) by mouth daily., Disp: 90 tablet, Rfl: 3   calcium carbonate (OSCAL) 1500 (600 Ca) MG TABS tablet, Take by mouth 2 (two) times daily with a meal., Disp: , Rfl:    clopidogrel (PLAVIX) 75 MG tablet, Take 1 tablet (75 mg total) by mouth daily., Disp: 90 tablet, Rfl: 3   levothyroxine (SYNTHROID) 100 MCG tablet, Take 1 tablet (100 mcg total) by mouth daily., Disp: 90 tablet, Rfl: 3   lisinopril (ZESTRIL) 40 MG tablet, Take 1 tablet by mouth once daily, Disp: 90 tablet, Rfl: 2   oxyCODONE-acetaminophen (PERCOCET) 5-325 MG tablet, Take 1 tablet by mouth every 4 (four) hours as needed for severe pain., Disp: 20 tablet, Rfl: 0   oxyCODONE-acetaminophen (PERCOCET/ROXICET) 5-325 MG tablet, Take 1 tablet by mouth every 6 (six) hours as needed for severe pain., Disp: 6 tablet, Rfl: 0   tiZANidine (ZANAFLEX) 4 MG tablet, Take 1 tablet (4 mg total) by mouth every 8 (eight) hours as needed for muscle spasms., Disp: 30 tablet, Rfl: 5   triamcinolone cream (KENALOG) 0.1 %, Apply 1 application topically 2 (two) times daily., Disp: 80 g, Rfl: 0  Assessment/ Plan: 71 y.o. female   Acute bacterial bronchitis - Plan: cefdinir (OMNICEF) 300 MG capsule, albuterol (VENTOLIN HFA) 108 (90 Base) MCG/ACT inhaler  COVID-negative by home test.  Given her sedentary state, cough and wheeze I am going to empirically treat her for presumed bacterial bronchitis.  I think that given her immobility in the setting of recent pelvic fracture she certainly has enough risk to develop atelectasis and possibly secondary infection.  Omnicef sent to pharmacy.  Albuterol inhaler sent.  I discussed red flag signs and symptoms warranting further evaluation and her daughter voiced good understanding and will follow-up as needed  Start time: 2:24pm; 2:47pm (called back for results and A/P) End time:  2:28pm; 2:50pm (call completed)  Total time spent on patient care (including telephone call/ virtual visit): 7 minutes  Chinenye Katzenberger Hulen Skains, DO [facility] [phone]

## 2021-08-03 ENCOUNTER — Encounter: Payer: Self-pay | Admitting: Family Medicine

## 2021-08-03 DIAGNOSIS — S32501A Unspecified fracture of right pubis, initial encounter for closed fracture: Secondary | ICD-10-CM | POA: Diagnosis not present

## 2021-08-03 MED ORDER — IBUPROFEN 600 MG PO TABS: 600.0000 mg | ORAL_TABLET | Freq: Two times a day (BID) | ORAL | 3 refills | Status: DC | PRN

## 2021-08-05 DIAGNOSIS — M2550 Pain in unspecified joint: Secondary | ICD-10-CM | POA: Diagnosis not present

## 2021-08-05 DIAGNOSIS — Z993 Dependence on wheelchair: Secondary | ICD-10-CM | POA: Diagnosis not present

## 2021-08-05 DIAGNOSIS — Z7902 Long term (current) use of antithrombotics/antiplatelets: Secondary | ICD-10-CM | POA: Diagnosis not present

## 2021-08-05 DIAGNOSIS — Z79891 Long term (current) use of opiate analgesic: Secondary | ICD-10-CM | POA: Diagnosis not present

## 2021-08-05 DIAGNOSIS — W19XXXD Unspecified fall, subsequent encounter: Secondary | ICD-10-CM | POA: Diagnosis not present

## 2021-08-05 DIAGNOSIS — S32501D Unspecified fracture of right pubis, subsequent encounter for fracture with routine healing: Secondary | ICD-10-CM | POA: Diagnosis not present

## 2021-08-10 DIAGNOSIS — S32501D Unspecified fracture of right pubis, subsequent encounter for fracture with routine healing: Secondary | ICD-10-CM | POA: Diagnosis not present

## 2021-08-10 DIAGNOSIS — Z79891 Long term (current) use of opiate analgesic: Secondary | ICD-10-CM | POA: Diagnosis not present

## 2021-08-10 DIAGNOSIS — Z993 Dependence on wheelchair: Secondary | ICD-10-CM | POA: Diagnosis not present

## 2021-08-10 DIAGNOSIS — Z7902 Long term (current) use of antithrombotics/antiplatelets: Secondary | ICD-10-CM | POA: Diagnosis not present

## 2021-08-10 DIAGNOSIS — M2550 Pain in unspecified joint: Secondary | ICD-10-CM | POA: Diagnosis not present

## 2021-08-10 DIAGNOSIS — W19XXXD Unspecified fall, subsequent encounter: Secondary | ICD-10-CM | POA: Diagnosis not present

## 2021-08-11 ENCOUNTER — Ambulatory Visit (INDEPENDENT_AMBULATORY_CARE_PROVIDER_SITE_OTHER): Payer: Medicare Other

## 2021-08-11 ENCOUNTER — Encounter: Payer: Self-pay | Admitting: Family Medicine

## 2021-08-11 DIAGNOSIS — Z7902 Long term (current) use of antithrombotics/antiplatelets: Secondary | ICD-10-CM

## 2021-08-11 DIAGNOSIS — W19XXXD Unspecified fall, subsequent encounter: Secondary | ICD-10-CM

## 2021-08-11 DIAGNOSIS — S32501D Unspecified fracture of right pubis, subsequent encounter for fracture with routine healing: Secondary | ICD-10-CM | POA: Diagnosis not present

## 2021-08-11 DIAGNOSIS — M2550 Pain in unspecified joint: Secondary | ICD-10-CM

## 2021-08-11 DIAGNOSIS — Z79891 Long term (current) use of opiate analgesic: Secondary | ICD-10-CM

## 2021-08-11 DIAGNOSIS — Z993 Dependence on wheelchair: Secondary | ICD-10-CM | POA: Diagnosis not present

## 2021-08-15 DIAGNOSIS — Z993 Dependence on wheelchair: Secondary | ICD-10-CM | POA: Diagnosis not present

## 2021-08-15 DIAGNOSIS — S32501D Unspecified fracture of right pubis, subsequent encounter for fracture with routine healing: Secondary | ICD-10-CM | POA: Diagnosis not present

## 2021-08-15 DIAGNOSIS — W19XXXD Unspecified fall, subsequent encounter: Secondary | ICD-10-CM | POA: Diagnosis not present

## 2021-08-15 DIAGNOSIS — Z79891 Long term (current) use of opiate analgesic: Secondary | ICD-10-CM | POA: Diagnosis not present

## 2021-08-15 DIAGNOSIS — M2550 Pain in unspecified joint: Secondary | ICD-10-CM | POA: Diagnosis not present

## 2021-08-15 DIAGNOSIS — Z7902 Long term (current) use of antithrombotics/antiplatelets: Secondary | ICD-10-CM | POA: Diagnosis not present

## 2021-08-18 ENCOUNTER — Encounter: Payer: Self-pay | Admitting: Family Medicine

## 2021-08-23 ENCOUNTER — Encounter: Payer: Self-pay | Admitting: Family Medicine

## 2021-08-23 ENCOUNTER — Ambulatory Visit (INDEPENDENT_AMBULATORY_CARE_PROVIDER_SITE_OTHER): Payer: Medicare Other | Admitting: Family Medicine

## 2021-08-23 DIAGNOSIS — R413 Other amnesia: Secondary | ICD-10-CM | POA: Insufficient documentation

## 2021-08-23 NOTE — Progress Notes (Signed)
BP 129/75   Pulse 80   Temp 97.6 F (36.4 C)   Ht 5' 6"  (1.676 m)   Wt 182 lb (82.6 kg)   SpO2 92%   BMI 29.38 kg/m    Subjective:   Patient ID: Laura Dalton, female    DOB: April 08, 1950, 71 y.o.   MRN: [id number]  HPI: Laura Dalton is a 71 y.o. female presenting on 08/23/2021 for Memory Loss (Requesting evaluation. Daughter would like to have b12 checked. Pt is also back in her home, living alone. )   HPI Memory Patient is coming in today with her daughter to be evaluated for memory.  Her daughter is concerned that her memory has been worsening.  She is also back living at home and no longer living with her daughter and that partially what she has been concerned about.  MMSE was 27 out of 30.  They just noticed that sometimes not all the time she is forgetting more things.  They just want to get ahead of it and get tested just to be sure.  Relevant past medical, surgical, family and social history reviewed and updated as indicated. Interim medical history since our last visit reviewed. Allergies and medications reviewed and updated.  Review of Systems  Constitutional:  Negative for chills and fever.  Eyes:  Negative for visual disturbance.  Respiratory:  Negative for chest tightness and shortness of breath.   Cardiovascular:  Negative for chest pain and leg swelling.  Musculoskeletal:  Negative for back pain and gait problem.  Skin:  Negative for rash.  Neurological:  Negative for dizziness, light-headedness and headaches.  Psychiatric/Behavioral:  Positive for confusion and decreased concentration. Negative for agitation and behavioral problems.   All other systems reviewed and are negative.   Per HPI unless specifically indicated above   Allergies as of 08/23/2021       Reactions   Aspirin Hives   Gabapentin Other (See Comments)   Dizzy,falls        Medication List        Accurate as of August 23, 2021  4:36 PM. If you have any questions, ask your nurse  or doctor.          STOP taking these medications    oxyCODONE-acetaminophen 5-325 MG tablet Commonly known as: Percocet Stopped by: Fransisca Kaufmann Jennylee Uehara, MD       TAKE these medications    albuterol 108 (90 Base) MCG/ACT inhaler Commonly known as: VENTOLIN HFA 2 puffs every 6 hours as needed for wheezing or shortness of breath   amitriptyline 100 MG tablet Commonly known as: ELAVIL Take 1 tablet (100 mg total) by mouth at bedtime.   atorvastatin 40 MG tablet Commonly known as: LIPITOR Take 1 tablet (40 mg total) by mouth daily.   calcium carbonate 1500 (600 Ca) MG Tabs tablet Commonly known as: OSCAL Take by mouth 2 (two) times daily with a meal.   clopidogrel 75 MG tablet Commonly known as: PLAVIX Take 1 tablet (75 mg total) by mouth daily.   ibuprofen 600 MG tablet Commonly known as: ADVIL Take 1 tablet (600 mg total) by mouth 2 (two) times daily as needed.   levothyroxine 100 MCG tablet Commonly known as: SYNTHROID Take 1 tablet (100 mcg total) by mouth daily.   lisinopril 40 MG tablet Commonly known as: ZESTRIL Take 1 tablet by mouth once daily   tiZANidine 4 MG tablet Commonly known as: Zanaflex Take 1 tablet (4 mg total) by mouth every  8 (eight) hours as needed for muscle spasms.   triamcinolone cream 0.1 % Commonly known as: KENALOG Apply 1 application topically 2 (two) times daily.         Objective:   BP 129/75   Pulse 80   Temp 97.6 F (36.4 C)   Ht 5' 6"  (1.676 m)   Wt 182 lb (82.6 kg)   SpO2 92%   BMI 29.38 kg/m   Wt Readings from Last 3 Encounters:  08/23/21 182 lb (82.6 kg)  07/28/21 184 lb (83.5 kg)  07/14/21 185 lb (83.9 kg)    Physical Exam Vitals and nursing note reviewed.  Constitutional:      General: She is not in acute distress.    Appearance: She is well-developed. She is not diaphoretic.  Eyes:     Conjunctiva/sclera: Conjunctivae normal.  Skin:    General: Skin is warm and dry.     Findings: No rash.   Neurological:     Mental Status: She is alert and oriented to person, place, and time. Mental status is at baseline.     Coordination: Coordination normal.  Psychiatric:        Behavior: Behavior normal.       Assessment & Plan:   Problem List Items Addressed This Visit       Other   Memory changes   Relevant Orders   CBC with Differential/Platelet   Vitamin B12   BMP8+EGFR    Discussed memory medication, she scored 27 out of 30 on MMSE but discussed optional Prevagen versus Aricept versus Namenda and she would like to try the Prevagen first Follow up plan: Return if symptoms worsen or fail to improve.  Counseling provided for all of the vaccine components Orders Placed This Encounter  Procedures   CBC with Differential/Platelet   Vitamin B12   BMP8+EGFR    Caryl Pina, MD [facility] 08/23/2021, 4:36 PM

## 2021-08-24 LAB — BMP8+EGFR
BUN/Creatinine Ratio: 9 — ABNORMAL LOW (ref 12–28)
BUN: 8 mg/dL (ref 8–27)
CO2: 24 mmol/L (ref 20–29)
Calcium: 10.6 mg/dL — ABNORMAL HIGH (ref 8.7–10.3)
Chloride: 98 mmol/L (ref 96–106)
Creatinine, Ser: 0.91 mg/dL (ref 0.57–1.00)
Glucose: 129 mg/dL — ABNORMAL HIGH (ref 70–99)
Potassium: 4.4 mmol/L (ref 3.5–5.2)
Sodium: 140 mmol/L (ref 134–144)
eGFR: 67 mL/min/{1.73_m2} (ref >59)

## 2021-08-24 LAB — CBC WITH DIFFERENTIAL/PLATELET
Basophils Absolute: 0 10*3/uL (ref 0.0–0.2)
Basos: 0 %
EOS (ABSOLUTE): 0.5 10*3/uL — ABNORMAL HIGH (ref 0.0–0.4)
Eos: 6 %
Hematocrit: 40.9 % (ref 34.0–46.6)
Hemoglobin: 13.2 g/dL (ref 11.1–15.9)
Immature Grans (Abs): 0 10*3/uL (ref 0.0–0.1)
Immature Granulocytes: 1 %
Lymphocytes Absolute: 1.7 10*3/uL (ref 0.7–3.1)
Lymphs: 20 %
MCH: 27.2 pg (ref 26.6–33.0)
MCHC: 32.3 g/dL (ref 31.5–35.7)
MCV: 84 fL (ref 79–97)
Monocytes Absolute: 0.9 10*3/uL (ref 0.1–0.9)
Monocytes: 10 %
Neutrophils Absolute: 5.7 10*3/uL (ref 1.4–7.0)
Neutrophils: 63 %
Platelets: 298 10*3/uL (ref 150–450)
RBC: 4.86 x10E6/uL (ref 3.77–5.28)
RDW: 12.9 % (ref 11.7–15.4)
WBC: 8.9 10*3/uL (ref 3.4–10.8)

## 2021-08-24 LAB — VITAMIN B12: Vitamin B-12: 302 pg/mL (ref 232–1245)

## 2021-09-07 ENCOUNTER — Ambulatory Visit: Payer: Medicare Other | Admitting: Family Medicine

## 2021-09-20 ENCOUNTER — Ambulatory Visit: Payer: Self-pay | Admitting: *Deleted

## 2021-09-20 NOTE — Chronic Care Management (AMB) (Signed)
  Chronic Care Management   Note  09/20/2021 Name: Laura Dalton MRN: [id number] DOB: [birth year]   Patient has not recently engaged with the Chronic Care Management RN Care Manager. Removing RN Care Manager from Care Team and closing RN Care Management Care Plans. If patient is currently engaged with another CCM team member I will forward this encounter to inform them of my case closure. Patient may be eligible for re-engagement with RN Care Manager in the future if necessary and can discuss this with their PCP.  Demetrios Loll, BSN, RN-BC Embedded Chronic Care Manager [facility] Care Management Direct Dial: [phone]

## 2021-09-22 ENCOUNTER — Ambulatory Visit: Payer: Medicare Other | Admitting: Family Medicine

## 2021-10-14 ENCOUNTER — Ambulatory Visit: Payer: Medicare Other | Admitting: Family Medicine

## 2021-10-14 ENCOUNTER — Ambulatory Visit (INDEPENDENT_AMBULATORY_CARE_PROVIDER_SITE_OTHER): Payer: Medicare Other | Admitting: Family Medicine

## 2021-10-14 ENCOUNTER — Encounter: Payer: Self-pay | Admitting: Family Medicine

## 2021-10-14 VITALS — BP 166/81 | HR 102 | Temp 98.0°F | Ht 66.0 in | Wt 185.0 lb

## 2021-10-14 DIAGNOSIS — Z1211 Encounter for screening for malignant neoplasm of colon: Secondary | ICD-10-CM | POA: Diagnosis not present

## 2021-10-14 DIAGNOSIS — J449 Chronic obstructive pulmonary disease, unspecified: Secondary | ICD-10-CM | POA: Diagnosis not present

## 2021-10-14 DIAGNOSIS — E785 Hyperlipidemia, unspecified: Secondary | ICD-10-CM | POA: Diagnosis not present

## 2021-10-14 DIAGNOSIS — I152 Hypertension secondary to endocrine disorders: Secondary | ICD-10-CM | POA: Diagnosis not present

## 2021-10-14 DIAGNOSIS — E1169 Type 2 diabetes mellitus with other specified complication: Secondary | ICD-10-CM

## 2021-10-14 DIAGNOSIS — E1159 Type 2 diabetes mellitus with other circulatory complications: Secondary | ICD-10-CM

## 2021-10-14 DIAGNOSIS — E1142 Type 2 diabetes mellitus with diabetic polyneuropathy: Secondary | ICD-10-CM

## 2021-10-14 DIAGNOSIS — E039 Hypothyroidism, unspecified: Secondary | ICD-10-CM | POA: Diagnosis not present

## 2021-10-14 LAB — BAYER DCA HB A1C WAIVED: HB A1C (BAYER DCA - WAIVED): 5.7 % — ABNORMAL HIGH (ref 4.8–5.6)

## 2021-10-14 NOTE — Progress Notes (Signed)
BP (!) 166/81   Pulse (!) 102   Temp 98 F (36.7 C)   Ht 5' 6"  (1.676 m)   Wt 185 lb (83.9 kg)   SpO2 94%   BMI 29.86 kg/m    Subjective:   Patient ID: Laura Dalton, female    DOB: 1950-11-23, 71 y.o.   MRN: [id number]  HPI: Laura Dalton is a 71 y.o. female presenting on 10/14/2021 for Medical Management of Chronic Issues, Hypertension, and Hypothyroidism   HPI COPD Patient is coming in for COPD recheck today.  He is currently on Ventolin although she says she is not using it.  He has a mild chronic cough but denies any major coughing spells or wheezing spells.  He has 0 nighttime symptoms per week and 3 daytime symptoms per week currently.   Type 2 diabetes mellitus Patient comes in today for recheck of his diabetes. Patient has been currently taking none currently. Patient states is currently on an ACE inhibitor/ARB. Patient has not seen an ophthalmologist this year. Patient denies any issues with their feet. The symptom started onset as an adult htn and hld and hypothyroidism ARE RELATED TO DM   Hypertension Patient is currently on lisinopril, and their blood pressure today is 135/63. Patient denies any lightheadedness or dizziness. Patient denies headaches, blurred vision, chest pains, shortness of breath, or weakness. Denies any side effects from medication and is content with current medication.   Hypothyroidism recheck Patient is coming in for thyroid recheck today as well. They deny any issues with hair changes or heat or cold problems or diarrhea or constipation. They deny any chest pain or palpitations. They are currently on levothyroxine 100 micrograms   Hyperlipidemia Patient is coming in for recheck of his hyperlipidemia. The patient is currently taking atorvastatin. They deny any issues with myalgias or history of liver damage from it. They deny any focal numbness or weakness or chest pain.   Relevant past medical, surgical, family and social history reviewed  and updated as indicated. Interim medical history since our last visit reviewed. Allergies and medications reviewed and updated.  Review of Systems  Constitutional:  Negative for chills and fever.  Eyes:  Negative for visual disturbance.  Respiratory:  Negative for chest tightness and shortness of breath.   Cardiovascular:  Negative for chest pain and leg swelling.  Musculoskeletal:  Negative for back pain and gait problem.  Skin:  Negative for rash.  Neurological:  Negative for dizziness, light-headedness and headaches.  Psychiatric/Behavioral:  Negative for agitation and behavioral problems.   All other systems reviewed and are negative.   Per HPI unless specifically indicated above   Allergies as of 10/14/2021       Reactions   Aspirin Hives   Gabapentin Other (See Comments)   Dizzy,falls        Medication List        Accurate as of October 14, 2021 11:12 AM. If you have any questions, ask your nurse or doctor.          albuterol 108 (90 Base) MCG/ACT inhaler Commonly known as: VENTOLIN HFA 2 puffs every 6 hours as needed for wheezing or shortness of breath   amitriptyline 100 MG tablet Commonly known as: ELAVIL Take 1 tablet (100 mg total) by mouth at bedtime.   atorvastatin 40 MG tablet Commonly known as: LIPITOR Take 1 tablet (40 mg total) by mouth daily.   calcium carbonate 1500 (600 Ca) MG Tabs tablet Commonly known as: OSCAL  Take by mouth 2 (two) times daily with a meal.   clopidogrel 75 MG tablet Commonly known as: PLAVIX Take 1 tablet (75 mg total) by mouth daily.   ibuprofen 600 MG tablet Commonly known as: ADVIL Take 1 tablet (600 mg total) by mouth 2 (two) times daily as needed.   levothyroxine 100 MCG tablet Commonly known as: SYNTHROID Take 1 tablet (100 mcg total) by mouth daily.   lisinopril 40 MG tablet Commonly known as: ZESTRIL Take 1 tablet by mouth once daily   tiZANidine 4 MG tablet Commonly known as: Zanaflex Take 1 tablet  (4 mg total) by mouth every 8 (eight) hours as needed for muscle spasms.   triamcinolone cream 0.1 % Commonly known as: KENALOG Apply 1 application topically 2 (two) times daily.         Objective:   BP (!) 166/81   Pulse (!) 102   Temp 98 F (36.7 C)   Ht 5' 6"  (1.676 m)   Wt 185 lb (83.9 kg)   SpO2 94%   BMI 29.86 kg/m   Wt Readings from Last 3 Encounters:  10/14/21 185 lb (83.9 kg)  08/23/21 182 lb (82.6 kg)  07/28/21 184 lb (83.5 kg)    Physical Exam Vitals and nursing note reviewed.  Constitutional:      General: She is not in acute distress.    Appearance: She is well-developed. She is not diaphoretic.  Eyes:     Conjunctiva/sclera: Conjunctivae normal.  Cardiovascular:     Rate and Rhythm: Normal rate and regular rhythm.     Heart sounds: Normal heart sounds. No murmur heard. Pulmonary:     Effort: Pulmonary effort is normal. No respiratory distress.     Breath sounds: Wheezing present.  Musculoskeletal:        General: No swelling or tenderness. Normal range of motion.  Skin:    General: Skin is warm and dry.     Findings: No rash.  Neurological:     Mental Status: She is alert and oriented to person, place, and time.     Coordination: Coordination normal.  Psychiatric:        Behavior: Behavior normal.       Assessment & Plan:   Problem List Items Addressed This Visit       Cardiovascular and Mediastinum   Hypertension associated with diabetes ([place]) - Primary     Respiratory   COPD (chronic obstructive pulmonary disease) (HCC)     Endocrine   DM (diabetes mellitus) ([place])   Relevant Orders   CBC with Differential/Platelet   CMP14+EGFR   Lipid panel   TSH   Bayer DCA Hb A1c Waived   Hyperlipidemia associated with type 2 diabetes mellitus ([place])   Relevant Orders   CBC with Differential/Platelet   CMP14+EGFR   Lipid panel   TSH   Bayer DCA Hb A1c Waived   Diabetic peripheral neuropathy associated with type 2 diabetes mellitus ([place])    Relevant Orders   CBC with Differential/Platelet   CMP14+EGFR   Lipid panel   TSH   Bayer DCA Hb A1c Waived   Hypothyroidism   Relevant Orders   TSH   Dyslipidemia due to type 2 diabetes mellitus ([place])   Relevant Orders   Lipid panel   Other Visit Diagnoses     Colon cancer screening       Relevant Orders   Fecal occult blood, imunochemical([facility])       We will do labs, continue current  medicine.  No changes. Follow up plan: Return in about 3 months (around 01/14/2022), or if symptoms worsen or fail to improve, for Diabetes COPD.  Counseling provided for all of the vaccine components Orders Placed This Encounter  Procedures   Fecal occult blood, imunochemical([facility])   CBC with Differential/Platelet   CMP14+EGFR   Lipid panel   TSH   Bayer DCA Hb A1c Waived    Caryl Pina, MD [facility] 10/14/2021, 11:12 AM

## 2021-10-15 LAB — CMP14+EGFR
ALT: 7 IU/L (ref 0–32)
AST: 14 IU/L (ref 0–40)
Albumin/Globulin Ratio: 1.9 (ref 1.2–2.2)
Albumin: 4.5 g/dL (ref 3.8–4.8)
Alkaline Phosphatase: 90 IU/L (ref 44–121)
BUN/Creatinine Ratio: 9 — ABNORMAL LOW (ref 12–28)
BUN: 8 mg/dL (ref 8–27)
Bilirubin Total: 0.3 mg/dL (ref 0.0–1.2)
CO2: 26 mmol/L (ref 20–29)
Calcium: 9.5 mg/dL (ref 8.7–10.3)
Chloride: 100 mmol/L (ref 96–106)
Creatinine, Ser: 0.91 mg/dL (ref 0.57–1.00)
Globulin, Total: 2.4 g/dL (ref 1.5–4.5)
Glucose: 98 mg/dL (ref 70–99)
Potassium: 4.6 mmol/L (ref 3.5–5.2)
Sodium: 140 mmol/L (ref 134–144)
Total Protein: 6.9 g/dL (ref 6.0–8.5)
eGFR: 67 mL/min/{1.73_m2} (ref >59)

## 2021-10-15 LAB — CBC WITH DIFFERENTIAL/PLATELET
Basophils Absolute: 0 10*3/uL (ref 0.0–0.2)
Basos: 0 %
EOS (ABSOLUTE): 0.4 10*3/uL (ref 0.0–0.4)
Eos: 7 %
Hematocrit: 38.3 % (ref 34.0–46.6)
Hemoglobin: 12.9 g/dL (ref 11.1–15.9)
Immature Grans (Abs): 0 10*3/uL (ref 0.0–0.1)
Immature Granulocytes: 0 %
Lymphocytes Absolute: 1.3 10*3/uL (ref 0.7–3.1)
Lymphs: 20 %
MCH: 28.1 pg (ref 26.6–33.0)
MCHC: 33.7 g/dL (ref 31.5–35.7)
MCV: 83 fL (ref 79–97)
Monocytes Absolute: 0.6 10*3/uL (ref 0.1–0.9)
Monocytes: 9 %
Neutrophils Absolute: 4.1 10*3/uL (ref 1.4–7.0)
Neutrophils: 64 %
Platelets: 234 10*3/uL (ref 150–450)
RBC: 4.59 x10E6/uL (ref 3.77–5.28)
RDW: 13 % (ref 11.7–15.4)
WBC: 6.4 10*3/uL (ref 3.4–10.8)

## 2021-10-15 LAB — TSH: TSH: 7.87 u[IU]/mL — ABNORMAL HIGH (ref 0.450–4.500)

## 2021-10-15 LAB — LIPID PANEL
Chol/HDL Ratio: 3 ratio (ref 0.0–4.4)
Cholesterol, Total: 137 mg/dL (ref 100–199)
HDL: 46 mg/dL (ref >39)
LDL Chol Calc (NIH): 70 mg/dL (ref 0–99)
Triglycerides: 113 mg/dL (ref 0–149)
VLDL Cholesterol Cal: 21 mg/dL (ref 5–40)

## 2021-10-24 ENCOUNTER — Other Ambulatory Visit: Payer: Self-pay

## 2021-10-24 DIAGNOSIS — E039 Hypothyroidism, unspecified: Secondary | ICD-10-CM

## 2021-10-24 DIAGNOSIS — E1142 Type 2 diabetes mellitus with diabetic polyneuropathy: Secondary | ICD-10-CM

## 2021-10-24 MED ORDER — LEVOTHYROXINE SODIUM 112 MCG PO TABS: 112.0000 ug | ORAL_TABLET | Freq: Every day | ORAL | 3 refills | Status: DC

## 2022-01-23 ENCOUNTER — Ambulatory Visit (INDEPENDENT_AMBULATORY_CARE_PROVIDER_SITE_OTHER): Payer: Medicare Other

## 2022-01-23 VITALS — Ht 65.0 in | Wt 182.0 lb

## 2022-01-23 DIAGNOSIS — Z1231 Encounter for screening mammogram for malignant neoplasm of breast: Secondary | ICD-10-CM

## 2022-01-23 DIAGNOSIS — Z0000 Encounter for general adult medical examination without abnormal findings: Secondary | ICD-10-CM | POA: Diagnosis not present

## 2022-01-23 NOTE — Patient Instructions (Signed)
Laura Dalton , Thank you for taking time to come for your Medicare Wellness Visit. I appreciate your ongoing commitment to your health goals. Please review the following plan we discussed and let me know if I can assist you in the future.   These are the goals we discussed:  Goals      DIET - INCREASE WATER INTAKE     Try to drink 6-8 glasses of water daily.     Exercise 3x per week (30 min per time)     Have 3 meals a day     Meal Planning     CARE PLAN ENTRY (see longitudinal plan of care for additional care plan information)  Current Barriers:  Knowledge Deficits related to meal planning for 1  Nurse Case Manager Clinical Goal(s):  Over the next 30 days, patient will work with RN Care Manager to address needs related to meal planning  Interventions:  Inter-disciplinary care team collaboration (see longitudinal plan of care) Chart reviewed Previously discussed current eating habits Doesn't plan meals. Mainly eats junk food and sandwiches Likes peanut butter No nutritional supplements Daughter-in-law will bring a fixed plate for her a couple times a week Eats alone Prepared meals in the past for her now deceased husband and her grandson that no longer lives with her Encouraged routine meals with lean protein, frutis/vegetables States that she felt good this morning and ate a biscuit with country ham and tomato Recommended nutritional supplement like Boost Previously prepared handout on meal planning and mailed to patient Suggested she talk with LCSW regarding psychosocial concerns  Patient Self Care Activities:  Performs ADL's independently Performs IADL's independently   Please see past updates related to this goal by clicking on the "Past Updates" button in the selected goal          This is a list of the screening recommended for you and due dates:  Health Maintenance  Topic Date Due   Stool Blood Test  11/08/2018   Screening for Lung Cancer  06/20/2019   Tetanus  Vaccine  03/13/2020   Yearly kidney health urinalysis for diabetes  12/30/2020   Flu Shot  10/11/2021   Mammogram  01/17/2022   Pneumonia Vaccine (1 - PCV) 04/11/2022*   Colon Cancer Screening  04/11/2022*   Hemoglobin A1C  04/16/2022   Eye exam for diabetics  05/24/2022   Complete foot exam   07/14/2022   Yearly kidney function blood test for diabetes  10/15/2022   Medicare Annual Wellness Visit  01/24/2023   DEXA scan (bone density measurement)  04/13/2023   Hepatitis C Screening: USPSTF Recommendation to screen - Ages 50-79 yo.  Completed   Zoster (Shingles) Vaccine  Completed   HPV Vaccine  Aged Out   COVID-19 Vaccine  Discontinued  *Topic was postponed. The date shown is not the original due date.    Advanced directives: Advance directive discussed with you today. I have provided a copy for you to complete at home and have notarized. Once this is complete please bring a copy in to our office so we can scan it into your chart.   Conditions/risks identified: Aim for 30 minutes of exercise or brisk walking, 6-8 glasses of water, and 5 servings of fruits and vegetables each day.   Next appointment: Follow up in one year for your annual wellness visit    Preventive Care 65 Years and Older, Female Preventive care refers to lifestyle choices and visits with your health care provider that can promote  health and wellness. What does preventive care include? A yearly physical exam. This is also called an annual well check. Dental exams once or twice a year. Routine eye exams. Ask your health care provider how often you should have your eyes checked. Personal lifestyle choices, including: Daily care of your teeth and gums. Regular physical activity. Eating a healthy diet. Avoiding tobacco and drug use. Limiting alcohol use. Practicing safe sex. Taking low-dose aspirin every day. Taking vitamin and mineral supplements as recommended by your health care provider. What happens during  an annual well check? The services and screenings done by your health care provider during your annual well check will depend on your age, overall health, lifestyle risk factors, and family history of disease. Counseling  Your health care provider may ask you questions about your: Alcohol use. Tobacco use. Drug use. Emotional well-being. Home and relationship well-being. Sexual activity. Eating habits. History of falls. Memory and ability to understand (cognition). Work and work Astronomer. Reproductive health. Screening  You may have the following tests or measurements: Height, weight, and BMI. Blood pressure. Lipid and cholesterol levels. These may be checked every 5 years, or more frequently if you are over 77 years old. Skin check. Lung cancer screening. You may have this screening every year starting at age 40 if you have a 30-pack-year history of smoking and currently smoke or have quit within the past 15 years. Fecal occult blood test (FOBT) of the stool. You may have this test every year starting at age 41. Flexible sigmoidoscopy or colonoscopy. You may have a sigmoidoscopy every 5 years or a colonoscopy every 10 years starting at age 73. Hepatitis C blood test. Hepatitis B blood test. Sexually transmitted disease (STD) testing. Diabetes screening. This is done by checking your blood sugar (glucose) after you have not eaten for a while (fasting). You may have this done every 1-3 years. Bone density scan. This is done to screen for osteoporosis. You may have this done starting at age 40. Mammogram. This may be done every 1-2 years. Talk to your health care provider about how often you should have regular mammograms. Talk with your health care provider about your test results, treatment options, and if necessary, the need for more tests. Vaccines  Your health care provider may recommend certain vaccines, such as: Influenza vaccine. This is recommended every year. Tetanus,  diphtheria, and acellular pertussis (Tdap, Td) vaccine. You may need a Td booster every 10 years. Zoster vaccine. You may need this after age 48. Pneumococcal 13-valent conjugate (PCV13) vaccine. One dose is recommended after age 58. Pneumococcal polysaccharide (PPSV23) vaccine. One dose is recommended after age 18. Talk to your health care provider about which screenings and vaccines you need and how often you need them. This information is not intended to replace advice given to you by your health care provider. Make sure you discuss any questions you have with your health care provider. Document Released: 03/26/2015 Document Revised: 11/17/2015 Document Reviewed: 12/29/2014 Elsevier Interactive Patient Education  2017 [employer].  Fall Prevention in the Home Falls can cause injuries. They can happen to people of all ages. There are many things you can do to make your home safe and to help prevent falls. What can I do on the outside of my home? Regularly fix the edges of walkways and driveways and fix any cracks. Remove anything that might make you trip as you walk through a door, such as a raised step or threshold. Trim any bushes or  trees on the path to your home. Use bright outdoor lighting. Clear any walking paths of anything that might make someone trip, such as rocks or tools. Regularly check to see if handrails are loose or broken. Make sure that both sides of any steps have handrails. Any raised decks and porches should have guardrails on the edges. Have any leaves, snow, or ice cleared regularly. Use sand or salt on walking paths during winter. Clean up any spills in your garage right away. This includes oil or grease spills. What can I do in the bathroom? Use night lights. Install grab bars by the toilet and in the tub and shower. Do not use towel bars as grab bars. Use non-skid mats or decals in the tub or shower. If you need to sit down in the shower, use a plastic,  non-slip stool. Keep the floor dry. Clean up any water that spills on the floor as soon as it happens. Remove soap buildup in the tub or shower regularly. Attach bath mats securely with double-sided non-slip rug tape. Do not have throw rugs and other things on the floor that can make you trip. What can I do in the bedroom? Use night lights. Make sure that you have a light by your bed that is easy to reach. Do not use any sheets or blankets that are too big for your bed. They should not hang down onto the floor. Have a firm chair that has side arms. You can use this for support while you get dressed. Do not have throw rugs and other things on the floor that can make you trip. What can I do in the kitchen? Clean up any spills right away. Avoid walking on wet floors. Keep items that you use a lot in easy-to-reach places. If you need to reach something above you, use a strong step stool that has a grab bar. Keep electrical cords out of the way. Do not use floor polish or wax that makes floors slippery. If you must use wax, use non-skid floor wax. Do not have throw rugs and other things on the floor that can make you trip. What can I do with my stairs? Do not leave any items on the stairs. Make sure that there are handrails on both sides of the stairs and use them. Fix handrails that are broken or loose. Make sure that handrails are as long as the stairways. Check any carpeting to make sure that it is firmly attached to the stairs. Fix any carpet that is loose or worn. Avoid having throw rugs at the top or bottom of the stairs. If you do have throw rugs, attach them to the floor with carpet tape. Make sure that you have a light switch at the top of the stairs and the bottom of the stairs. If you do not have them, ask someone to add them for you. What else can I do to help prevent falls? Wear shoes that: Do not have high heels. Have rubber bottoms. Are comfortable and fit you well. Are closed  at the toe. Do not wear sandals. If you use a stepladder: Make sure that it is fully opened. Do not climb a closed stepladder. Make sure that both sides of the stepladder are locked into place. Ask someone to hold it for you, if possible. Clearly mark and make sure that you can see: Any grab bars or handrails. First and last steps. Where the edge of each step is. Use tools that help you  move around (mobility aids) if they are needed. These include: Canes. Walkers. Scooters. Crutches. Turn on the lights when you go into a dark area. Replace any light bulbs as soon as they burn out. Set up your furniture so you have a clear path. Avoid moving your furniture around. If any of your floors are uneven, fix them. If there are any pets around you, be aware of where they are. Review your medicines with your doctor. Some medicines can make you feel dizzy. This can increase your chance of falling. Ask your doctor what other things that you can do to help prevent falls. This information is not intended to replace advice given to you by your health care provider. Make sure you discuss any questions you have with your health care provider. Document Released: 12/24/2008 Document Revised: 08/05/2015 Document Reviewed: 04/03/2014 Elsevier Interactive Patient Education  2017 [employer].

## 2022-01-23 NOTE — Progress Notes (Signed)
Subjective:   Laura Dalton is a 71 y.o. female who presents for Medicare Annual (Subsequent) preventive examination. I connected with  Laura Dalton on 01/23/22 by a audio enabled telemedicine application and verified that I am speaking with the correct person using two identifiers.  Patient Location: Home  Provider Location: Home Office  I discussed the limitations of evaluation and management by telemedicine. The patient expressed understanding and agreed to proceed.  Review of Systems     Cardiac Risk Factors include: advanced age (>61men, >74 women);diabetes mellitus;dyslipidemia;hypertension     Objective:    Today's Vitals   01/23/22 1112  Weight: 182 lb (82.6 kg)  Height:  (1.651 m)   Body mass index is 30.29 kg/m.     01/23/2022   11:17 AM 07/14/2021   12:33 PM 01/11/2021   11:21 AM 11/16/2020    9:42 AM 12/16/2018    8:51 AM 06/20/2018    6:07 PM 09/03/2017    9:17 AM  Advanced Directives  Does Patient Have a Medical Advance Directive? No No No No No No Yes  Type of Tax inspector;Living will  Does patient want to make changes to medical advance directive?       No - Patient declined  Copy of Healthcare Power of Attorney in Chart?       No - copy requested  Would patient like information on creating a medical advance directive? No - Patient declined No - Patient declined No - Patient declined No - Patient declined No - Patient declined      Current Medications (verified) Outpatient Encounter Medications as of 01/23/2022  Medication Sig   albuterol (VENTOLIN HFA) 108 (90 Base) MCG/ACT inhaler 2 puffs every 6 hours as needed for wheezing or shortness of breath   amitriptyline (ELAVIL) 100 MG tablet Take 1 tablet (100 mg total) by mouth at bedtime.   atorvastatin (LIPITOR) 40 MG tablet Take 1 tablet (40 mg total) by mouth daily.   calcium carbonate (OSCAL) 1500 (600 Ca) MG TABS tablet Take by mouth 2 (two) times daily  with a meal.   clopidogrel (PLAVIX) 75 MG tablet Take 1 tablet (75 mg total) by mouth daily.   ibuprofen (ADVIL) 600 MG tablet Take 1 tablet (600 mg total) by mouth 2 (two) times daily as needed.   levothyroxine (SYNTHROID) 112 MCG tablet Take 1 tablet (112 mcg total) by mouth daily.   lisinopril (ZESTRIL) 40 MG tablet Take 1 tablet by mouth once daily   tiZANidine (ZANAFLEX) 4 MG tablet Take 1 tablet (4 mg total) by mouth every 8 (eight) hours as needed for muscle spasms.   triamcinolone cream (KENALOG) 0.1 % Apply 1 application topically 2 (two) times daily.   No facility-administered encounter medications on file as of 01/23/2022.    Allergies (verified) Aspirin and Gabapentin   History: Past Medical History:  Diagnosis Date   Bursitis of hip    Diabetes mellitus without complication (HCC)    type II   Hyperlipidemia    Hypertension    Hypothyroidism    Low back pain    benign turmor back   Stroke (HCC)    Vitamin D deficiency disease    Past Surgical History:  Procedure Laterality Date   APPENDECTOMY     BACK SURGERY  1996   turmor on back    CHOLECYSTECTOMY     LEG SURGERY Right    "clot and had  drain in leg  rt lower leg   Family History  Problem Relation Age of Onset   Diabetes Mother    Stroke Mother    Seizures Mother    Cancer Father        prostate   COPD Sister    COPD Sister    COPD Sister    Cancer Brother        lung   Cancer Brother        prostate   COPD Brother    COPD Brother    Thyroid disease Son    Hashimoto's thyroiditis Son    Breast cancer Neg Hx    Social History   Socioeconomic History   Marital status: Widowed    Spouse name: Not on file   Number of children: 3   Years of education: 14   Highest education level: Associate degree: academic program  Occupational History   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   Children out of town   She depends on her neighbor a lot for yard work and to check on her daily   Social Determinants of Health   Financial Resource Strain: Low Risk  (01/23/2022)   Overall Financial Resource Strain (CARDIA)    Difficulty of Paying Living Expenses: Not hard at all  Food Insecurity: No Food Insecurity (01/23/2022)   Hunger Vital Sign    Worried About Running Out of Food in the Last Year: Never true    Ran Out of Food in the Last Year: Never true  Transportation Needs: No Transportation Needs (01/23/2022)   PRAPARE - Administrator, Civil Service (Medical): No    Lack of Transportation (Non-Medical): No  Physical Activity: Insufficiently Active (01/23/2022)   Exercise Vital Sign    Days of Exercise per Week: 3 days    Minutes of Exercise per Session: 30 min  Stress: No Stress Concern Present (01/23/2022)   [employer] of Occupational Health - Occupational Stress Questionnaire    Feeling of Stress : Not at all  Social Connections: Moderately Integrated (01/23/2022)   Social Connection and Isolation Panel [NHANES]    Frequency of Communication with Friends and Family: More than three times a week    Frequency of Social Gatherings with Friends and Family: More than three times a week    Attends Religious Services: More than 4 times per year    Active Member of [employer] or Organizations: Yes    Attends Banker Meetings: More than 4 times per year    Marital Status: Widowed    Tobacco Counseling Counseling given: Not Answered   Clinical  Intake:  Pre-visit preparation completed: Yes  Pain : No/denies pain     Nutritional Risks: None Diabetes: No  How often do you need to have someone help you when you read instructions,  pamphlets, or other written materials from your doctor or pharmacy?: 1 - Never  Diabetic?yes Nutrition Risk Assessment:  Has the patient had any N/V/D within the last 2 months?  No  Does the patient have any non-healing wounds?  No  Has the patient had any unintentional weight loss or weight gain?  No   Diabetes:  Is the patient diabetic?  Yes  If diabetic, was a CBG obtained today?  No  Did the patient bring in their glucometer from home?  No  How often do you monitor your CBG's? Once in while per patient .   Financial Strains and Diabetes Management:  Are you having any financial strains with the device, your supplies or your medication? No .  Does the patient want to be seen by Chronic Care Management for management of their diabetes?  No  Would the patient like to be referred to a Nutritionist or for Diabetic Management?  No   Diabetic Exams:  Diabetic Eye Exam: Completed 10/2021 Diabetic Foot Exam: Overdue, Pt has been advised about the importance in completing this exam. Pt is scheduled for diabetic foot exam on next office visit .   Interpreter Needed?: No  Information entered by :: Renie Ora, LPN   Activities of Daily Living    01/23/2022   11:16 AM  In your present state of health, do you have any difficulty performing the following activities:  Hearing? 0  Vision? 0  Difficulty concentrating or making decisions? 0  Walking or climbing stairs? 0  Dressing or bathing? 0  Doing errands, shopping? 0  Preparing Food and eating ? N  Using the Toilet? N  In the past six months, have you accidently leaked urine? N  Do you have problems with loss of bowel control? N  Managing your Medications? N  Managing your Finances? N  Housekeeping or managing your Housekeeping? N     Patient Care Team: Dettinger, Elige Radon, MD as PCP - General (Family Medicine) Branch, Dorothe Pea, MD as PCP - Cardiology (Cardiology) Donnetta Hail, MD as Consulting Physician (Rheumatology) Ranee Gosselin, MD as Consulting Physician (Orthopedic Surgery) Valetta Fuller, Brand Males, NP (Inactive) as Nurse Practitioner (Internal Medicine)  Indicate any recent Medical Services you may have received from other than [facility] providers in the past year (date may be approximate).     Assessment:   This is a routine wellness examination for Keitha.  Hearing/Vision screen Vision Screening - Comments:: Annual eye exams wears glasses   Dietary issues and exercise activities discussed: Current Exercise Habits: Home exercise routine, Type of exercise: walking, Time (Minutes): 30, Frequency (Times/Week): 3, Weekly Exercise (Minutes/Week): 90, Exercise limited by: orthopedic condition(s)   Goals Addressed             This Visit's Progress    Exercise 3x per week (30 min per time)   On track      Depression Screen    01/23/2022   11:14 AM 10/14/2021   10:47 AM 08/23/2021    4:09 PM 07/28/2021   10:11 AM 07/13/2021   11:06 AM 04/11/2021    1:19 PM 01/11/2021   10:43 AM  PHQ 2/9 Scores  PHQ - 2 Score 0 2 2 0 4 1 1   PHQ- 9 Score  15 11 6 13 6 5     Fall Risk    01/23/2022   11:13 AM 10/14/2021   10:46 AM 08/23/2021    4:08 PM 07/28/2021   10:10 AM 07/13/2021   11:05 AM  Fall Risk   Falls in the past year? 1 1 1 1 1   Number falls in past yr: 1 1 1 1 1   Injury with Fall? 1 1 1 1  0  Risk for fall due to : History of fall(s);Impaired balance/gait;Orthopedic patient Impaired balance/gait Impaired balance/gait;Orthopedic patient Impaired balance/gait;Impaired mobility;Orthopedic patient Impaired balance/gait  Follow up Education provided;Falls prevention discussed Falls evaluation completed Falls evaluation completed Falls evaluation completed Falls evaluation completed    FALL RISK PREVENTION  PERTAINING TO THE HOME:  Any stairs in or around the home? No  If so, are there any without handrails? No  Home free of loose throw rugs in walkways, pet beds, electrical cords, etc? Yes  Adequate lighting in your home to reduce risk of falls? Yes   ASSISTIVE DEVICES UTILIZED TO PREVENT FALLS:  Life alert? No  Use of a cane, walker or w/c? Yes  Grab bars in the bathroom? Yes  Shower chair or bench in shower? Yes  Elevated toilet seat or a handicapped toilet? Yes       09/03/2017    9:30 AM 08/31/2016   11:51 AM  MMSE - Mini Mental State Exam  Orientation to time 5 5  Orientation to Place 5 5  Registration 3 3  Attention/ Calculation 5 5  Recall 2 3  Language- name 2 objects 2 2  Language- repeat 1 1  Language- follow 3 step command 3 3  Language- read & follow direction 1 1  Write a sentence 1 1  Copy design 1 1  Total score 29 30        01/23/2022   11:16 AM 11/16/2020    9:42 AM 12/16/2018    8:58 AM  6CIT Screen  What Year? 0 points 0 points 0 points  What month? 0 points 0 points 0 points  What time? 0 points 0 points 0 points  Count back from 20 0 points 0 points 0 points  Months in reverse 0 points 0 points 2 points  Repeat phrase 2 points 4 points 2 points  Total Score 2 points 4 points 4 points    Immunizations Immunization History  Administered Date(s) Administered   Influenza Whole 03/13/2010   Tdap 03/13/2010   Zoster Recombinat (Shingrix) 04/11/2021, 07/13/2021    TDAP status: Up to date  Flu Vaccine status: Due, Education has been provided regarding the importance of this vaccine. Advised may receive this vaccine at local pharmacy or Health Dept. Aware to provide a copy of the vaccination record if obtained from local pharmacy or Health Dept. Verbalized acceptance and understanding.  Pneumococcal vaccine status: Due, Education has been provided regarding the importance of this vaccine. Advised may receive this vaccine at local pharmacy or Health  Dept. Aware to provide a copy of the vaccination record if obtained from local pharmacy or Health Dept. Verbalized acceptance and understanding.  Covid-19 vaccine status: Completed vaccines  Qualifies for Shingles Vaccine? Yes   Zostavax completed Yes   Shingrix Completed?: Yes  Screening Tests Health Maintenance  Topic Date Due   COLON CANCER SCREENING ANNUAL FOBT  11/08/2018   Lung Cancer Screening  06/20/2019   TETANUS/TDAP  03/13/2020   Diabetic kidney evaluation - Urine ACR  12/30/2020   INFLUENZA VACCINE  10/11/2021   MAMMOGRAM  01/17/2022   Pneumonia Vaccine 4365+ Years old (1 - PCV) 04/11/2022 (Originally 08/15/2014)   COLONOSCOPY (Pts 45-4660yrs Insurance coverage will need to be confirmed)  04/11/2022 (Originally 08/15/1994)   HEMOGLOBIN A1C  04/16/2022  OPHTHALMOLOGY EXAM  05/24/2022   FOOT EXAM  07/14/2022   Diabetic kidney evaluation - GFR measurement  10/15/2022   Medicare Annual Wellness (AWV)  01/24/2023   DEXA SCAN  04/13/2023   Hepatitis C Screening  Completed   Zoster Vaccines- Shingrix  Completed   HPV VACCINES  Aged Out   COVID-19 Vaccine  Discontinued    Health Maintenance  Health Maintenance Due  Topic Date Due   COLON CANCER SCREENING ANNUAL FOBT  11/08/2018   Lung Cancer Screening  06/20/2019   TETANUS/TDAP  03/13/2020   Diabetic kidney evaluation - Urine ACR  12/30/2020   INFLUENZA VACCINE  10/11/2021   MAMMOGRAM  01/17/2022    Colorectal cancer screening: Referral to GI placed 10/14/2021. Pt aware the office will call re: appt.  Mammogram status: Ordered 01/23/2022. Pt provided with contact info and advised to call to schedule appt.   Bone Density status: Completed 04/12/2021. Results reflect: Bone density results: OSTEOPENIA. Repeat every 5 years.  Lung Cancer Screening: (Low Dose CT Chest recommended if Age 46-80 years, 30 pack-year currently smoking OR have quit w/in 15years.) does not qualify.   Lung Cancer Screening Referral:  n/a  Additional Screening:  Hepatitis C Screening: does not qualify;   Vision Screening: Recommended annual ophthalmology exams for early detection of glaucoma and other disorders of the eye. Is the patient up to date with their annual eye exam?  Yes  Who is the provider or what is the name of the office in which the patient attends annual eye exams? [facility]  If pt is not established with a provider, would they like to be referred to a provider to establish care? No .   Dental Screening: Recommended annual dental exams for proper oral hygiene  Community Resource Referral / Chronic Care Management: CRR required this visit?  No   CCM required this visit?  No      Plan:     I have personally reviewed and noted the following in the patient's chart:   Medical and social history Use of alcohol, tobacco or illicit drugs  Current medications and supplements including opioid prescriptions. Patient is not currently taking opioid prescriptions. Functional ability and status Nutritional status Physical activity Advanced directives List of other physicians Hospitalizations, surgeries, and ER visits in previous 12 months Vitals Screenings to include cognitive, depression, and falls Referrals and appointments  In addition, I have reviewed and discussed with patient certain preventive protocols, quality metrics, and best practice recommendations. A written personalized care plan for preventive services as well as general preventive health recommendations were provided to patient.     Lorrene Reid, LPN   [id number]   Nurse Notes: Due pneumonia

## 2022-02-15 ENCOUNTER — Ambulatory Visit
Admission: RE | Admit: 2022-02-15 | Discharge: 2022-02-15 | Disposition: A | Discharge status: Home / Self Care | Payer: Medicare Other | Source: Ambulatory Visit | Attending: Family Medicine | Admitting: Family Medicine

## 2022-02-15 DIAGNOSIS — Z1231 Encounter for screening mammogram for malignant neoplasm of breast: Secondary | ICD-10-CM

## 2022-02-27 ENCOUNTER — Encounter: Payer: Self-pay | Admitting: Cardiology

## 2022-02-27 ENCOUNTER — Ambulatory Visit: Payer: Medicare Other | Attending: Cardiology | Admitting: Cardiology

## 2022-02-27 VITALS — BP 136/64 | HR 80 | Ht 66.0 in | Wt 186.2 lb

## 2022-02-27 DIAGNOSIS — E782 Mixed hyperlipidemia: Secondary | ICD-10-CM | POA: Diagnosis not present

## 2022-02-27 DIAGNOSIS — R0789 Other chest pain: Secondary | ICD-10-CM

## 2022-02-27 DIAGNOSIS — I10 Essential (primary) hypertension: Secondary | ICD-10-CM

## 2022-02-27 NOTE — Patient Instructions (Addendum)

## 2022-02-27 NOTE — Progress Notes (Signed)
Clinical Summary Laura Dalton is a 71 y.o.female seen today for follow up of the following medical problems.    1. Chest pain - patient seen previously for episodes of chest pain. She has an abnormal EKG at baseline, and completed an echo and stress echo that were overall unremarkable at that time.     - no chest pains, no SOB/DOE     2. HTN -she is compliant with meds     3. HL - 06/2019 TC 123 TG 114 HDL 42 LDL 60 - 10/2021 TC 299 TG 371 HDL 46 LDL 70   4. CVA - admit 07/2015 with CVA. Has no long term residual deficits.  - ASA allergy, on plavix for secondary prevention    5. Carotid stenosis - carotid US 07/2015 mild bilateral <50% ICA disease  Jan 2023 carotid US: 1-39% bilateral    6. CAD - noted on 06/2018 CT A/P, as well as moderate atherosclerosis - no recent symptoms        Past Medical History:  Diagnosis Date   Bursitis of hip    Diabetes mellitus without complication (HCC)    type II   Hyperlipidemia    Hypertension    Hypothyroidism    Low back pain    benign turmor back   Stroke (HCC)    Vitamin D deficiency disease      Allergies  Allergen Reactions   Aspirin Hives   Gabapentin Other (See Comments)    Dizzy,falls     Current Outpatient Medications  Medication Sig Dispense Refill   albuterol (VENTOLIN HFA) 108 (90 Base) MCG/ACT inhaler 2 puffs every 6 hours as needed for wheezing or shortness of breath 6.7 g 0   amitriptyline (ELAVIL) 100 MG tablet Take 1 tablet (100 mg total) by mouth at bedtime. 90 tablet 3   atorvastatin (LIPITOR) 40 MG tablet Take 1 tablet (40 mg total) by mouth daily. 90 tablet 3   calcium carbonate (OSCAL) 1500 (600 Ca) MG TABS tablet Take by mouth 2 (two) times daily with a meal.     clopidogrel (PLAVIX) 75 MG tablet Take 1 tablet (75 mg total) by mouth daily. 90 tablet 3   ibuprofen (ADVIL) 600 MG tablet Take 1 tablet (600 mg total) by mouth 2 (two) times daily as needed. 60 tablet 3   levothyroxine (SYNTHROID)  112 MCG tablet Take 1 tablet (112 mcg total) by mouth daily. 90 tablet 3   lisinopril (ZESTRIL) 40 MG tablet Take 1 tablet by mouth once daily 90 tablet 2   tiZANidine (ZANAFLEX) 4 MG tablet Take 1 tablet (4 mg total) by mouth every 8 (eight) hours as needed for muscle spasms. 30 tablet 5   triamcinolone cream (KENALOG) 0.1 % Apply 1 application topically 2 (two) times daily. 80 g 0   No current facility-administered medications for this visit.     Past Surgical History:  Procedure Laterality Date   APPENDECTOMY     BACK SURGERY  1996   turmor on back    CHOLECYSTECTOMY     LEG SURGERY Right    "clot and had drain in leg  rt lower leg     Allergies  Allergen Reactions   Aspirin Hives   Gabapentin Other (See Comments)    Dizzy,falls      Family History  Problem Relation Age of Onset   Diabetes Mother    Stroke Mother    Seizures Mother    Cancer Father  prostate   COPD Sister    COPD Sister    COPD Sister    Cancer Brother        lung   Cancer Brother        prostate   COPD Brother    COPD Brother    Thyroid disease Son    Hashimoto's thyroiditis Son    Breast cancer Neg Hx      Social History Ms. Hessler reports that she quit smoking about 14 years ago. Her smoking use included cigarettes. She started smoking about 59 years ago. She has a 22.50 pack-year smoking history. She has never used smokeless tobacco. Ms. Laroche reports no history of alcohol use.   Review of Systems CONSTITUTIONAL: No weight loss, fever, chills, weakness or fatigue.  HEENT: Eyes: No visual loss, blurred vision, double vision or yellow sclerae.No hearing loss, sneezing, congestion, runny nose or sore throat.  SKIN: No rash or itching.  CARDIOVASCULAR: per hpi RESPIRATORY: No shortness of breath, cough or sputum.  GASTROINTESTINAL: No anorexia, nausea, vomiting or diarrhea. No abdominal pain or blood.  GENITOURINARY: No burning on urination, no polyuria NEUROLOGICAL: No headache,  dizziness, syncope, paralysis, ataxia, numbness or tingling in the extremities. No change in bowel or bladder control.  MUSCULOSKELETAL: No muscle, back pain, joint pain or stiffness.  LYMPHATICS: No enlarged nodes. No history of splenectomy.  PSYCHIATRIC: No history of depression or anxiety.  ENDOCRINOLOGIC: No reports of sweating, cold or heat intolerance. No polyuria or polydipsia.  Marland Kitchen   Physical Examination Today's Vitals   02/27/22 1544  BP: 136/64  Pulse: 80  SpO2: 98%  Weight: 186 lb 3.2 oz (84.5 kg)  Height: 5\' 6"  (1.676 m)   Body mass index is 30.05 kg/m.  Gen: resting comfortably, no acute distress HEENT: no scleral icterus, pupils equal round and reactive, no palptable cervical adenopathy,  CV: RRR, no m/rg, no jvd Resp: Clear to auscultation bilaterally GI: abdomen is soft, non-tender, non-distended, normal bowel sounds, no hepatosplenomegaly MSK: extremities are warm, no edema.  Skin: warm, no rash Neuro:  no focal deficits Psych: appropriate affect   Diagnostic Studies     Assessment and Plan  01-07-13 EKG: sinus rhythm, poor R-wave progression, non-specific ST/T changes   01/2013 echo Study Conclusions  - Left ventricle: The cavity size was normal. Wall thickness   was increased in a pattern of mild LVH. Systolic function   was normal. The estimated ejection fraction was in the   range of 60% to 65%. Wall motion was normal; there were no   regional wall motion abnormalities. Doppler parameters are   consistent with abnormal left ventricular relaxation   (grade 1 diastolic dysfunction). - Aortic valve: Trileaflet; mildly thickened leaflets. No   significant regurgitation. Mean gradient: 45mm Hg (S). - Mitral valve: Mildly thickened leaflets . Trivial   regurgitation. - Right atrium: Central venous pressure: 10mm Hg (est). - Atrial septum: No defect or patent foramen ovale was   identified. - Tricuspid valve: Physiologic regurgitation. - Pulmonary  arteries: PA peak pressure: 55mm Hg (S). - Pericardium, extracardiac: There was no pericardial   effusion. Impressions:  - No prior study for comparison. Mild LVH with LVEF 60-65%,   grade 1 diastolic dysfunction. Mildly thickened aortic and   mitral valves, nosignificant regurgitant lesions. PASP 30   mm mercury. No pericardial effusion.   01/2013 Stress echo Study Conclusions  - Stress ECG conclusions: The stress ECG was negative for   ischemia. - Staged echo: There was  no echocardiographic evidence for   stress-induced ischemia.       Assessment and Plan   1. Chest pain -no symptoms, continue to monitor   2. HTN  - at goal, conitnue current meds   3. Hyperlipidemia  -at goal, continue current meds      Antoine Poche, M.D.

## 2022-03-11 ENCOUNTER — Other Ambulatory Visit: Payer: Self-pay | Admitting: Cardiology

## 2022-04-04 ENCOUNTER — Encounter: Payer: Self-pay | Admitting: Family Medicine

## 2022-04-05 ENCOUNTER — Ambulatory Visit (INDEPENDENT_AMBULATORY_CARE_PROVIDER_SITE_OTHER): Payer: Medicare Other

## 2022-04-05 ENCOUNTER — Ambulatory Visit (INDEPENDENT_AMBULATORY_CARE_PROVIDER_SITE_OTHER): Payer: Medicare Other | Admitting: Family Medicine

## 2022-04-05 ENCOUNTER — Encounter: Payer: Self-pay | Admitting: Family Medicine

## 2022-04-05 VITALS — BP 154/78 | HR 101 | Temp 97.8°F | Ht 66.0 in | Wt 185.4 lb

## 2022-04-05 DIAGNOSIS — W19XXXA Unspecified fall, initial encounter: Secondary | ICD-10-CM

## 2022-04-05 DIAGNOSIS — M546 Pain in thoracic spine: Secondary | ICD-10-CM

## 2022-04-05 MED ORDER — NAPROXEN 500 MG PO TABS: 500.0000 mg | ORAL_TABLET | Freq: Two times a day (BID) | ORAL | 0 refills | Status: AC

## 2022-04-05 NOTE — Progress Notes (Signed)
Subjective:  Patient ID: Laura Dalton, female    DOB: June 29, 1950, 72 y.o.   MRN: [id number]  Patient Care Team: Dettinger, Fransisca Kaufmann, MD as PCP - General (Family Medicine) Harl Bowie, Alphonse Guild, MD as PCP - Cardiology (Cardiology) Hennie Duos, MD as Consulting Physician (Rheumatology) Latanya Maudlin, MD as Consulting Physician (Orthopedic Surgery) Vaughan Basta, Rona Ravens, NP (Inactive) as Nurse Practitioner (Internal Medicine)   Chief Complaint:  Fall (Patient states she has had two falls and has back pain )   HPI: Laura Dalton is a 72 y.o. female presenting on 04/05/2022 for Fall (Patient states she has had two falls and has back pain )   Pt presents today for thoracic back pain after 2 recent falls. States she tripped and fell over 2 weeks ago causing pain to her right ribs. States she fell again 5 days ago causing pain to her mid back. Denies hitting head, no LOC. Denies chest pain, shortness of breath, palpitations, weakness, or confusion. She has not taken anything for her pain.   Fall The fall occurred while walking. She landed on Hard floor. Point of impact: back, ribs. The pain is present in the back. The pain is mild. The symptoms are aggravated by ambulation, pressure on injury, rotation and movement. Pertinent negatives include no abdominal pain, bowel incontinence, fever, headaches, hearing loss, hematuria, loss of consciousness, nausea, numbness, tingling, visual change or vomiting. She has tried nothing for the symptoms.   Relevant past medical, surgical, family, and social history reviewed and updated as indicated.  Allergies and medications reviewed and updated. Data reviewed: Chart in Epic.   Past Medical History:  Diagnosis Date   Bursitis of hip    Diabetes mellitus without complication ([place])    type II   Hyperlipidemia    Hypertension    Hypothyroidism    Low back pain    benign turmor back   Stroke ([place])    Vitamin D deficiency disease     Past  Surgical History:  Procedure Laterality Date   APPENDECTOMY     BACK SURGERY  1996   turmor on back    CHOLECYSTECTOMY     LEG SURGERY Right    "clot and had drain in leg  rt lower leg    Social History   Socioeconomic History   Marital status: Widowed    Spouse name: Not on file   Number of children: 3   Years of education: 14   Highest education level: Associate degree: academic program  Occupational History   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   Children out of town   She depends on her neighbor a lot for yard work and to check on her daily   Social Determinants of Health   Financial Resource Strain: Low Risk  (01/23/2022)   Overall Financial  Resource Strain (CARDIA)    Difficulty of Paying Living Expenses: Not hard at all  Food Insecurity: No Food Insecurity (01/23/2022)   Hunger Vital Sign    Worried About Running Out of Food in the Last Year: Never true    Ran Out of Food in the Last Year: Never true  Transportation Needs: No Transportation Needs (01/23/2022)   PRAPARE - Hydrologist (Medical): No    Lack of Transportation (Non-Medical): No  Physical Activity: Insufficiently Active (01/23/2022)   Exercise Vital Sign    Days of Exercise per Week: 3 days    Minutes of Exercise per Session: 30 min  Stress: No Stress Concern Present (01/23/2022)   [place]    Feeling of  Stress : Not at all  Social Connections: Moderately Integrated (01/23/2022)   Social Connection and Isolation Panel [NHANES]    Frequency of Communication with Friends and Family: More than three times a week    Frequency of Social Gatherings with Friends and Family: More than three times a week    Attends Religious Services: More than 4 times per year    Active Member of [employer] or Organizations: Yes    Attends Archivist Meetings: More than 4 times per year    Marital Status: Widowed  Intimate Partner Violence: Not At Risk (01/23/2022)   Humiliation, Afraid, Rape, and Kick questionnaire    Fear of Current or Ex-Partner: No    Emotionally Abused: No    Physically Abused: No    Sexually Abused: No    Outpatient Encounter Medications as of 04/05/2022  Medication Sig   albuterol (VENTOLIN HFA) 108 (90 Base) MCG/ACT inhaler 2 puffs every 6 hours as needed for wheezing or shortness of breath   amitriptyline (ELAVIL) 100 MG tablet Take 1 tablet (100 mg total) by mouth at bedtime.   atorvastatin (LIPITOR) 40 MG tablet Take 1 tablet (40 mg total) by mouth daily.   calcium carbonate (OSCAL) 1500 (600 Ca) MG TABS tablet Take by mouth 2 (two) times daily with a meal.   clopidogrel (PLAVIX) 75 MG tablet Take 1 tablet (75 mg total) by mouth daily.   levothyroxine (SYNTHROID) 112 MCG tablet Take 1 tablet (112 mcg total) by mouth daily.   levothyroxine (SYNTHROID) 125 MCG tablet Take 125 mcg by mouth daily before breakfast.   lisinopril (ZESTRIL) 40 MG tablet Take 1 tablet by mouth once daily   naproxen (NAPROSYN) 500 MG tablet Take 1 tablet (500 mg total) by mouth 2 (two) times daily with a meal for 14 days.   tiZANidine (ZANAFLEX) 4 MG tablet Take 1 tablet (4 mg total) by mouth every 8 (eight) hours as needed for muscle spasms.   triamcinolone cream (KENALOG) 0.1 % Apply 1 application topically 2 (two) times daily.   [DISCONTINUED] ibuprofen (ADVIL) 600 MG tablet Take 1 tablet (600 mg total)  by mouth 2 (two) times daily as needed.   No facility-administered encounter medications on file as of 04/05/2022.    Allergies  Allergen Reactions   Aspirin Hives   Gabapentin Other (See Comments)    Dizzy,falls    Review of Systems  Constitutional:  Positive for activity change. Negative for appetite change, chills, diaphoresis, fatigue, fever and unexpected weight change.  HENT:  Negative for congestion.   Respiratory:  Negative for apnea, cough, choking, chest tightness, shortness of breath, wheezing and stridor.   Cardiovascular:  Negative for chest pain,  palpitations and leg swelling.  Gastrointestinal:  Negative for abdominal distention, abdominal pain, anal bleeding, blood in stool, bowel incontinence, constipation, diarrhea, nausea, rectal pain and vomiting.  Genitourinary:  Negative for decreased urine volume, difficulty urinating and hematuria.  Musculoskeletal:  Positive for arthralgias, back pain and gait problem (uses walker). Negative for joint swelling, myalgias and neck pain.  Neurological:  Negative for dizziness, tingling, tremors, seizures, loss of consciousness, syncope, facial asymmetry, speech difficulty, weakness, light-headedness, numbness and headaches.  Psychiatric/Behavioral:  Negative for confusion.   All other systems reviewed and are negative.       Objective:  BP (!) 154/78   Pulse (!) 101   Temp 97.8 F (36.6 C) (Temporal)   Ht 5\' 6"  (1.676 m)   Wt 185 lb 6.4 oz (84.1 kg)   SpO2 93%   BMI 29.92 kg/m    Wt Readings from Last 3 Encounters:  04/05/22 185 lb 6.4 oz (84.1 kg)  02/27/22 186 lb 3.2 oz (84.5 kg)  01/23/22 182 lb (82.6 kg)    Physical Exam Vitals and nursing note reviewed.  Constitutional:      General: She is not in acute distress.    Appearance: Normal appearance. She is overweight. She is not ill-appearing, toxic-appearing or diaphoretic.  HENT:     Head: Normocephalic and atraumatic.  Eyes:     Pupils: Pupils are equal,  round, and reactive to light.  Cardiovascular:     Rate and Rhythm: Normal rate and regular rhythm.     Pulses: Normal pulses.     Heart sounds: Normal heart sounds.  Pulmonary:     Effort: Pulmonary effort is normal.     Breath sounds: Normal breath sounds.  Abdominal:     Palpations: Abdomen is soft.  Musculoskeletal:     Cervical back: Normal.     Thoracic back: Tenderness present. No swelling, edema, deformity, signs of trauma, lacerations, spasms or bony tenderness. Decreased range of motion. No scoliosis.     Lumbar back: Normal.       Back:     Right lower leg: No edema.     Left lower leg: No edema.  Skin:    General: Skin is warm and dry.     Capillary Refill: Capillary refill takes less than 2 seconds.  Neurological:     General: No focal deficit present.     Mental Status: She is alert and oriented to person, place, and time.     Gait: Gait abnormal (using walker, slow).  Psychiatric:        Mood and Affect: Mood normal.        Behavior: Behavior normal.        Thought Content: Thought content normal.        Judgment: Judgment normal.     Results for orders placed or performed in visit on 10/14/21  CBC with Differential/Platelet  Result Value Ref Range   WBC 6.4 3.4 - 10.8 x10E3/uL   RBC 4.59 3.77 - 5.28 x10E6/uL   Hemoglobin 12.9 11.1 - 15.9 g/dL   Hematocrit 38.3 34.0 - 46.6 %   MCV 83 79 - 97 fL   MCH 28.1 26.6 - 33.0 pg   MCHC 33.7 31.5 - 35.7 g/dL   RDW 13.0 11.7 - 15.4 %   Platelets 234 150 - 450 x10E3/uL   Neutrophils 64 Not Estab. %   Lymphs 20 Not Estab. %   Monocytes 9 Not Estab. %   Eos 7 Not Estab. %  Basos 0 Not Estab. %   Neutrophils Absolute 4.1 1.4 - 7.0 x10E3/uL   Lymphocytes Absolute 1.3 0.7 - 3.1 x10E3/uL   Monocytes Absolute 0.6 0.1 - 0.9 x10E3/uL   EOS (ABSOLUTE) 0.4 0.0 - 0.4 x10E3/uL   Basophils Absolute 0.0 0.0 - 0.2 x10E3/uL   Immature Granulocytes 0 Not Estab. %   Immature Grans (Abs) 0.0 0.0 - 0.1 x10E3/uL  CMP14+EGFR   Result Value Ref Range   Glucose 98 70 - 99 mg/dL   BUN 8 8 - 27 mg/dL   Creatinine, Ser 9.79 0.57 - 1.00 mg/dL   eGFR 67 >89 QJ/JHE/1.74   BUN/Creatinine Ratio 9 (L) 12 - 28   Sodium 140 134 - 144 mmol/L   Potassium 4.6 3.5 - 5.2 mmol/L   Chloride 100 96 - 106 mmol/L   CO2 26 20 - 29 mmol/L   Calcium 9.5 8.7 - 10.3 mg/dL   Total Protein 6.9 6.0 - 8.5 g/dL   Albumin 4.5 3.8 - 4.8 g/dL   Globulin, Total 2.4 1.5 - 4.5 g/dL   Albumin/Globulin Ratio 1.9 1.2 - 2.2   Bilirubin Total 0.3 0.0 - 1.2 mg/dL   Alkaline Phosphatase 90 44 - 121 IU/L   AST 14 0 - 40 IU/L   ALT 7 0 - 32 IU/L  Lipid panel  Result Value Ref Range   Cholesterol, Total 137 100 - 199 mg/dL   Triglycerides 081 0 - 149 mg/dL   HDL 46 >44 mg/dL   VLDL Cholesterol Cal 21 5 - 40 mg/dL   LDL Chol Calc (NIH) 70 0 - 99 mg/dL   Chol/HDL Ratio 3.0 0.0 - 4.4 ratio  TSH  Result Value Ref Range   TSH 7.870 (H) 0.450 - 4.500 uIU/mL  Bayer DCA Hb A1c Waived  Result Value Ref Range   HB A1C (BAYER DCA - WAIVED) 5.7 (H) 4.8 - 5.6 %     X-Ray: thoracic spine: degenerative changes with disc space narrowing. No acute findings. Preliminary x-ray reading by Kari Baars, FNP-C, [facility].   Pertinent labs & imaging results that were available during my care of the patient were reviewed by me and considered in my medical decision making.  Assessment & Plan:  Laura Dalton was seen today for fall.  Diagnoses and all orders for this visit:  Fall, initial encounter Acute midline thoracic back pain Degenerative changes noted on imaging, no acute fractures. Will notify pt if radiology reading differs. Will treat with below for 2 weeks. Pt aware to report new, worsening, or persistent symptoms. Follow up in 2 weeks for reevaluation.  -     DG Thoracic Spine 2 View; Future -     naproxen (NAPROSYN) 500 MG tablet; Take 1 tablet (500 mg total) by mouth 2 (two) times daily with a meal for 14 days.    Continue all other maintenance  medications.  Follow up plan: Return in about 2 weeks (around 04/19/2022), or if symptoms worsen or fail to improve, for back pain.   Continue healthy lifestyle choices, including diet (rich in fruits, vegetables, and lean proteins, and low in salt and simple carbohydrates) and exercise (at least 30 minutes of moderate physical activity daily).  Educational handout given for osteopenia   The above assessment and management plan was discussed with the patient. The patient verbalized understanding of and has agreed to the management plan. Patient is aware to call the clinic if they develop any new symptoms or if symptoms persist or worsen. Patient is aware  when to return to the clinic for a follow-up visit. Patient educated on when it is appropriate to go to the emergency department.   Michelle Tekesha Almgren, FNP-C [facility] [phone]   

## 2022-04-05 NOTE — Patient Instructions (Signed)
Your recent bone density scan demonstrated that you have osteopenia.  Osteopenia is when your bones become thinner and weaker.  This is not osteoporosis but can become osteoporosis over time.  Today, I provided you a handout reviewing calcium and vitamin D and ways to incorporate this in your diet.  You may need to take a supplement if you are unable to get sufficient amount of these minerals through food.  Strength training is just as important in maintaining bone health.  A copy of home exercises has been provided to you. You need to repeat your bone density in 2 years.   Exercise for Strong Bones  Exercise is important to build and maintain strong bones / bone density.  There are 2 types of exercises that are important to building and maintaining strong bones:  Weight- bearing and muscle-stregthening.  Weight-bearing Exercises  These exercises include activities that make you move against gravity while staying upright. Weight-bearing exercises can be high-impact or low-impact.  High-impact weight-bearing exercises help build bones and keep them strong. If you have broken a bone due to osteoporosis or are at risk of breaking a bone, you may need to avoid high-impact exercises. If you're not sure, you should check with your healthcare provider.  Examples of high-impact weight-bearing exercises are: Dancing  Doing high-impact aerobics  Hiking  Jogging/running  Jumping Rope  Stair climbing  Tennis  Low-impact weight-bearing exercises can also help keep bones strong and are a safe alternative if you cannot do high-impact exercises.   Examples of low-impact weight-bearing exercises are: Using elliptical training machines  Doing low-impact aerobics  Using stair-step machines  Fast walking on a treadmill or outside   Muscle-Strengthening Exercises These exercises include activities where you move your body, a weight or some other resistance against gravity. They are also known as resistance  exercises and include: Lifting weights  Using elastic exercise bands  Using weight machines  Lifting your own body weight  Functional movements, such as standing and rising up on your toes  Yoga and Pilates can also improve strength, balance and flexibility. However, certain positions may not be safe for people with osteoporosis or those at increased risk of broken bones. For example, exercises that have you bend forward may increase the chance of breaking a bone in the spine.   Non-Impact Exercises There are other types of exercises that can help prevent falls.  Non-impact exercises can help you to improve balance, posture and how well you move in everyday activities. Some of these exercises include: Balance exercises that strengthen your legs and test your balance, such as Tai Chi, can decrease your risk of falls.  Posture exercises that improve your posture and reduce rounded or "sloping" shoulders can help you decrease the chance of breaking a bone, especially in the spine.  Functional exercises that improve how well you move can help you with everyday activities and decrease your chance of falling and breaking a bone. For example, if you have trouble getting up from a chair or climbing stairs, you should do these activities as exercises.   A physical therapist can teach you balance, posture and functional exercises. He/she can also help you learn which exercises are safe and appropriate for you.  [facility] has a physical therapy office in [place] in front of our office and referrals can be made for assessments and treatment as needed and strength and balance training.  If you would like to have an assessment with [facility] and our physical therapy   team please let a nurse or provider know.  For more information go to the National Osteoporosis Foundation website: https://www.nof.org/.   Click the striped box in the right upper corner.   Then click through patient info in the left lower hand  corner to out more about osteoporosis and osteopenia and how you can prevent fractures.   Follow these instructions at home: Include calcium and vitamin D in your diet. Calcium is important for bone health, and vitamin D helps the body absorb calcium. Os-Cal is a great over the counter supplement to take daily. It has calcium and Vitamin D.  Perform weight-bearing and muscle-strengthening exercises as directed by your health care provider.  Do not use any tobacco products, including cigarettes, chewing tobacco, and electronic cigarettes. If you need help quitting, ask your health care provider. Limit your alcohol intake. Take medicines only as directed by your health care provider. Keep all follow-up visits as directed by your health care provider. This is important. Take precautions at home to lower your risk of falling, such as: Keeping rooms well lit and clutter free. Installing safety rails on stairs. Using rubber mats in the bathroom and other areas that are often wet or slippery.     Calcium & Vitamin D: The Facts  Why is calcium and vitamin D consumption important? Calcium: Most Americans do not consume adequate amounts of calcium! Calcium is required for proper muscle function, nerve communication, bone support, and many other functions in the body.  The body uses bones as a source of calcium. Bones 'remodel' themselves continuously - the body constantly breaks bone down to release calcium and rebuilds bones by replacing calcium in the bone later.  As we get older, the rate of bone breakdown occurs faster than bone rebuilding which could lead to osteopenia, osteoporosis, and possible fractures.   Vitamin D: People naturally make vitamin D in the body when sunlight hits the skin and triggers a process that leads to vitamin D production. This natural vitamin D production requires about 10-15 minutes of sun exposure on the hands, arms, and face at least 2-3 times per week. However,  due to decreased sun exposure and the use of sunscreen, most people will need to get additional vitamin D from foods or supplements. Your doctor can measure your body's vitamin D level through a simple blood test to determine your daily vitamin D needs.  Vitamin D is used to help the body absorb calcium, maintain bone health, help the immune system, and reduce inflammation. It also plays a role in muscle performance, balance and risk of falling.  Vitamin D deficiency can lead to osteomalacia or softening of the bones, bone pain, and muscle weakness.   The recommended daily allowance of Calcium and Vitamin D varies for different age groups. Age group Calcium (mg) Vitamin D (IU)  Females and Males: Age 19-50 1000 mg 600 IU  Females: Age 51- 70 1200 mg 600 IU  Males: Age 51-70 1000 mg 600 IU  Females and Males: Age 71+ 1200 mg 800 IU  Pregnant/lactating Females age 19-50 1000 mg 600 IU   How much Calcium do you get in your diet? Calcium Intake # of servings per day  Total calcium (mg)  Skim milk, 2% milk (1 cup) _________ x 300 mg   Yogurt (1 small container) _________ x 200 mg   Cheese (1oz) _________ x 200 mg   Cottage Cheese (1 cup)               ________ x 150 mg   Almond milk (1 cup) _________ x 450 mg   Fortified Orange Juice (1 cup) _________ x 300 mg   Broccoli or spinach ( 1 cup) _________ x 100 mg   Salmon (3 oz) _________ x 150 mg    Almonds (1/4 cup) _______ x 90 mg      How do we get Calcium and Vitamin D in our diet? Calcium: Obtaining calcium from the diet is the most preferred way to reach the recommended daily goal. If this goal is not reached through diet, calcium supplements are available.  Calcium is found in many foods including: dairy products, dark leafy vegetables (like broccoli, kale, and spinach), fish, and fortified products like juices and cereals.  The food label will have a %DV (percent daily value) listed showing the amount of calcium per serving. To determine  the total mg per serving, simply replace the % with zero (0).  For example, Almond Breeze almond milk contains 45% DV of calcium or 450mg per 1 cup.  You can increase the amount of calcium in your diet by using more calcium products in your daily meals. Use yogurt and fruit to make smoothies or use yogurt to top baked potatoes or make whipped potatoes. Sprinkle low fat cheese onto salads or into egg white omelets. You can even add non-fat dry milk powder (300mg calcium per 1/3 cup) to hot cereals, meat loaf, soups, or potatoes.  Calcium supplements come in many forms including tablets, chewables, and gummies. Be sure to read the label to determine the correct number of tablets per serving and whether or not to take the supplement with food.  Calcium carbonate products (Oscal, Caltrate, and Viactiv) are generally better absorbed when taken with food while calcium citrate products like Citracal can be taken with or without food.  The body can only absorb about 600 mg of calcium at one time. It is recommended to take calcium supplements in small amounts several times per day.  However, taking it all at once is better than not taking it at all. Increasing your intake of calcium is essential for bone health, but may also lead to some side effects like constipation, increased gas, bloating or abdominal cramping. To help reduce these side effects, start with 1 tablet per day and slowly increase your intake of the supplement to the recommended doses. It is also recommended that you drink plenty of water each day. Vitamin D: Very few foods naturally contain vitamin D. However, it is found in saltwater fish (like tuna, salmon and mackerel), beef liver, egg yolks, cheese and vitamin D fortified foods (like yogurt, cereals, orange juice and milk) The amount of vitamin D in each food or product is listed as %DV on the product label. To determine the total amount of vitamin D per serving, drop the % sign and multiply the  number by 4. For example, 1 cup of Almond Breeze almond milk contains 25% DV vitamin D or 100 IU per serving (25 x 4 =100). Vitamin D is also found in multivitamins and supplements and may be listed as ergocalciferol (vitamin D2) or cholecalciferol (vitamin D3). Each of these forms of vitamin D are equivalent and the daily recommended intake will vary based on your age and the vitamin D levels in your body. Follow your doctor's recommendation for vitamin D intake.    

## 2022-04-12 ENCOUNTER — Telehealth: Payer: Self-pay | Admitting: Family Medicine

## 2022-04-12 NOTE — Telephone Encounter (Signed)
Patient aware referral placed and she will be contacted.

## 2022-04-13 ENCOUNTER — Encounter: Payer: Self-pay | Admitting: Orthopaedic Surgery

## 2022-04-13 ENCOUNTER — Ambulatory Visit: Payer: Medicare Other | Admitting: Orthopaedic Surgery

## 2022-04-13 VITALS — Ht 66.0 in | Wt 185.0 lb

## 2022-04-13 DIAGNOSIS — S22080A Wedge compression fracture of T11-T12 vertebra, initial encounter for closed fracture: Secondary | ICD-10-CM | POA: Diagnosis not present

## 2022-04-13 DIAGNOSIS — S22000A Wedge compression fracture of unspecified thoracic vertebra, initial encounter for closed fracture: Secondary | ICD-10-CM | POA: Insufficient documentation

## 2022-04-13 DIAGNOSIS — S32010A Wedge compression fracture of first lumbar vertebra, initial encounter for closed fracture: Secondary | ICD-10-CM | POA: Diagnosis not present

## 2022-04-13 DIAGNOSIS — S32000A Wedge compression fracture of unspecified lumbar vertebra, initial encounter for closed fracture: Secondary | ICD-10-CM | POA: Insufficient documentation

## 2022-04-13 MED ORDER — TRAMADOL HCL 50 MG PO TABS: 50.0000 mg | ORAL_TABLET | Freq: Two times a day (BID) | ORAL | 0 refills | Status: DC | PRN

## 2022-04-13 NOTE — Progress Notes (Signed)
Office Visit Note   Patient: Laura Dalton           Date of Birth: 07-Jan-1951           MRN: [id number] Visit Date: 04/13/2022              Requested by: Baruch Gouty, [address],  [place] [address] PCP: Dettinger, Fransisca Kaufmann, MD   Assessment & Plan: Visit Diagnoses:  1. Compression fracture of T12 vertebra, initial encounter ([facility])   2. Compression fracture of L1 vertebra, initial encounter [facility])     Plan: Will check her back in 5 weeks.  Ultram sent in that she can use for pain.  She understands this will not completely take all the pain away but may improve her pain symptoms.  Follow-Up Instructions: No follow-ups on file.   Orders:  No orders of the defined types were placed in this encounter.  Meds ordered this encounter  Medications   traMADol (ULTRAM) 50 MG tablet    Sig: Take 1 tablet (50 mg total) by mouth every 12 (twelve) hours as needed.    Dispense:  40 tablet    Refill:  0    Acute compression fracture      Procedures: No procedures performed   Clinical Data: No additional findings.   Subjective: Chief Complaint  Patient presents with   Lower Back - Fracture   Middle Back - Fracture    HPI 72 year old female with former smoker quit many years ago had several falls she states she had a bone density test few years ago that was normal.  Had several falls broke her pelvis in May rami fracture has had some falls and has T12 and L1 50% compression fracture.  Her balance is poor.  Her daughter usually picks up her medicines.  Her husband already passed away.  She is having pain at the thoracolumbar junction.  Patient was trying to stuff a towel underneath the door to help with the block the draft and states her feet started trembling or jumping which she said was like dancing and then she fell.  She had a walker next to her.  Patient is using her cane in the office today.  Review of Systems all other systems are noncontributory.  Previous  x-rays of her hip showed no significant hip osteoarthritis.  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   Objective: Vital Signs: Ht 5\' 6"  (1.676 m)   Wt 185 lb (83.9 kg)   BMI 29.86 kg/m   Physical Exam Constitutional:      Appearance: She is well-developed.  HENT:     Head: Normocephalic.     Right Ear: External ear normal.     Left Ear: External ear normal. There is no impacted cerumen.  Eyes:     Pupils: Pupils are equal, round, and reactive to light.  Neck:     Thyroid: No thyromegaly.     Trachea: No tracheal deviation.  Cardiovascular:     Rate and Rhythm: Normal rate.  Pulmonary:     Effort: Pulmonary effort is normal.  Abdominal:     Palpations: Abdomen is soft.  Musculoskeletal:     Cervical back: No rigidity.  Skin:    General: Skin is warm and dry.  Neurological:     Mental Status: She is alert and oriented to person, place, and time.  Psychiatric:        Behavior: Behavior normal.     Ortho Exam intact  lower extremity sensation.  Decreased balance she is amatory with a cane.  Specialty Comments:  No specialty comments available.  Imaging: rrative & Impression  CLINICAL DATA:  Thoracic spine pain after a fall. Initial encounter.   EXAM: THORACIC SPINE 2 VIEWS   COMPARISON:  CT chest, abdomen and pelvis 06/20/2018. MRI thoracic spine 08/26/2018.   FINDINGS: Mild inferior endplate compression fractures of the thoracolumbar junction appear tear involve the T12 and L1 vertebral bodies. The fractures are new since the prior exams but cannot be definitively characterized. No other fracture is identified. Multilevel degenerative change is present. There is partial visualization of convex right lumbar scoliosis. Aortic atherosclerosis noted   IMPRESSION: Mild inferior endplate compression fractures are likely at T12 and L1. The fractures are new since 2020 but cannot be definitively characterized.     Electronically Signed   By: Inge Rise M.D.   On:  04/07/2022 12:13     PMFS History: Patient Active Problem List   Diagnosis Date Noted   Thoracic compression fracture ([place]) 04/13/2022   Lumbar compression fracture ([facility]) 04/13/2022   Memory changes 08/23/2021   Degeneration of spine 05/07/2017   Obesity (BMI 30.0-34.9) 03/12/2015   Hypothyroidism 07/29/2014   Osteoarthritis of right shoulder 01/21/2014   Leg length discrepancy 11/04/2013   Lumbar post-laminectomy syndrome 11/04/2013   Diabetic peripheral neuropathy associated with type 2 diabetes mellitus ([place]) 11/04/2013   COPD (chronic obstructive pulmonary disease) ([place]) 10/01/2012   Chronic back pain 10/01/2012   Hypertension associated with diabetes ([facility]) 10/01/2012   DM (diabetes mellitus) ([place]) 10/01/2012   Vitamin D deficiency 10/01/2012   Hyperlipidemia associated with type 2 diabetes mellitus ([place]) 10/01/2012   Dyslipidemia due to type 2 diabetes mellitus ([place]) 10/01/2012   Past Medical History:  Diagnosis Date   Bursitis of hip    Diabetes mellitus without complication ([place])    type II   Hyperlipidemia    Hypertension    Hypothyroidism    Low back pain    benign turmor back   Stroke ([place])    Vitamin D deficiency disease     Family History  Problem Relation Age of Onset   Diabetes Mother    Stroke Mother    Seizures Mother    Cancer Father        prostate   COPD Sister    COPD Sister    COPD Sister    Cancer Brother        lung   Cancer Brother        prostate   COPD Brother    COPD Brother    Thyroid disease Son    Hashimoto's thyroiditis Son    Breast cancer Neg Hx     Past Surgical History:  Procedure Laterality Date   APPENDECTOMY     BACK SURGERY  1996   turmor on back    CHOLECYSTECTOMY     LEG SURGERY Right    "clot and had drain in leg  rt lower leg   Social History   Occupational History   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

## 2022-04-20 ENCOUNTER — Encounter: Payer: Self-pay | Admitting: Family Medicine

## 2022-04-20 ENCOUNTER — Ambulatory Visit (INDEPENDENT_AMBULATORY_CARE_PROVIDER_SITE_OTHER): Payer: Medicare Other | Admitting: Family Medicine

## 2022-04-20 ENCOUNTER — Telehealth: Payer: Self-pay

## 2022-04-20 VITALS — BP 134/80 | HR 78 | Ht 66.0 in | Wt 188.0 lb

## 2022-04-20 DIAGNOSIS — M8588 Other specified disorders of bone density and structure, other site: Secondary | ICD-10-CM | POA: Diagnosis not present

## 2022-04-20 DIAGNOSIS — S22080D Wedge compression fracture of T11-T12 vertebra, subsequent encounter for fracture with routine healing: Secondary | ICD-10-CM

## 2022-04-20 DIAGNOSIS — R296 Repeated falls: Secondary | ICD-10-CM | POA: Diagnosis not present

## 2022-04-20 DIAGNOSIS — S22080S Wedge compression fracture of T11-T12 vertebra, sequela: Secondary | ICD-10-CM

## 2022-04-20 NOTE — Progress Notes (Signed)
  Care Management   Outreach Note  04/20/2022 Name: Laura Dalton MRN: [id number] DOB: [birth year]  An unsuccessful telephone outreach was attempted today to contact the patient about Chronic Care Management needs.    Follow Up Plan:  A HIPAA compliant phone message was left for the patient providing contact information and requesting a return call.  The care management team will reach out to the patient again over the next 7 days.  If patient returns call to provider office, please advise to call [facility] * at [phone] Larsson, [place], [place] [address] Direct Dial: [phone] [email or web address]

## 2022-04-20 NOTE — Progress Notes (Signed)
BP 134/80   Pulse 78   Ht 5\' 6"  (1.676 m)   Wt 188 lb (85.3 kg)   SpO2 (!) 88%   BMI 30.34 kg/m    Subjective:   Patient ID: Laura Dalton, female    DOB: 1950/05/17, 72 y.o.   MRN: [id number]  HPI: Laura Dalton is a 72 y.o. female presenting on 04/20/2022 for Back Pain (2 week follow up. Some improvement per pt. )   HPI Back pain 2-week follow-up Patient is coming in today for back pain for 2-week follow-up.  She had a T12 compression fracture and did see Dr. Inda Merlin.  She is currently taking calcium and vitamin D but is not on other treatment for osteoporosis.  The concern is with this being her second compression fracture in the she likely has the osteoporosis and we will do referral for management with pharmacist and help with that.  She says her pain is okay with the tramadol that the orthopedic gave her and she will go back and see them to help manage.  Relevant past medical, surgical, family and social history reviewed and updated as indicated. Interim medical history since our last visit reviewed. Allergies and medications reviewed and updated.  Review of Systems  Constitutional:  Negative for chills and fever.  Eyes:  Negative for visual disturbance.  Respiratory:  Negative for chest tightness and shortness of breath.   Cardiovascular:  Negative for chest pain and leg swelling.  Musculoskeletal:  Positive for arthralgias, back pain, gait problem and myalgias.  Skin:  Negative for rash.  Neurological:  Positive for weakness. Negative for light-headedness and headaches.  Psychiatric/Behavioral:  Negative for agitation and behavioral problems.   All other systems reviewed and are negative.   Per HPI unless specifically indicated above   Allergies as of 04/20/2022       Reactions   Aspirin Hives   Gabapentin Other (See Comments)   Dizzy,falls        Medication List        Accurate as of April 20, 2022 10:49 AM. If you have any questions, ask your nurse  or doctor.          albuterol 108 (90 Base) MCG/ACT inhaler Commonly known as: VENTOLIN HFA 2 puffs every 6 hours as needed for wheezing or shortness of breath   amitriptyline 100 MG tablet Commonly known as: ELAVIL Take 1 tablet (100 mg total) by mouth at bedtime.   atorvastatin 40 MG tablet Commonly known as: LIPITOR Take 1 tablet (40 mg total) by mouth daily.   calcium carbonate 1500 (600 Ca) MG Tabs tablet Commonly known as: OSCAL Take by mouth 2 (two) times daily with a meal.   clopidogrel 75 MG tablet Commonly known as: PLAVIX Take 1 tablet (75 mg total) by mouth daily.   levothyroxine 125 MCG tablet Commonly known as: SYNTHROID Take 125 mcg by mouth daily before breakfast.   levothyroxine 112 MCG tablet Commonly known as: SYNTHROID Take 1 tablet (112 mcg total) by mouth daily.   lisinopril 40 MG tablet Commonly known as: ZESTRIL Take 1 tablet by mouth once daily   tiZANidine 4 MG tablet Commonly known as: Zanaflex Take 1 tablet (4 mg total) by mouth every 8 (eight) hours as needed for muscle spasms.   traMADol 50 MG tablet Commonly known as: ULTRAM Take 1 tablet (50 mg total) by mouth every 12 (twelve) hours as needed.   triamcinolone cream 0.1 % Commonly known as: KENALOG Apply  1 application topically 2 (two) times daily.         Objective:   BP 134/80   Pulse 78   Ht 5\' 6"  (1.676 m)   Wt 188 lb (85.3 kg)   SpO2 (!) 88%   BMI 30.34 kg/m   Wt Readings from Last 3 Encounters:  04/20/22 188 lb (85.3 kg)  04/13/22 185 lb (83.9 kg)  04/05/22 185 lb 6.4 oz (84.1 kg)    Physical Exam Vitals and nursing note reviewed.  Constitutional:      General: She is not in acute distress.    Appearance: She is well-developed. She is not diaphoretic.  Eyes:     Conjunctiva/sclera: Conjunctivae normal.  Cardiovascular:     Rate and Rhythm: Normal rate and regular rhythm.     Heart sounds: Normal heart sounds. No murmur heard. Pulmonary:     Effort:  Pulmonary effort is normal. No respiratory distress.     Breath sounds: Normal breath sounds. No wheezing.  Musculoskeletal:        General: No swelling. Normal range of motion.     Thoracic back: Spasms, tenderness and bony tenderness present. Normal range of motion.     Lumbar back: No tenderness or bony tenderness. Negative right straight leg raise test and negative left straight leg raise test.  Skin:    General: Skin is warm and dry.     Findings: No rash.  Neurological:     Mental Status: She is alert and oriented to person, place, and time.     Coordination: Coordination normal.  Psychiatric:        Behavior: Behavior normal.       Assessment & Plan:   Problem List Items Addressed This Visit       Musculoskeletal and Integument   Thoracic compression fracture ([place]) - Primary   Relevant Orders   AMB Referral to Pharmacy Medication Management   Ambulatory referral to Physical Therapy   Other Visit Diagnoses     Recurrent falls       Relevant Orders   Ambulatory referral to Physical Therapy   Osteopenia of lumbar spine       Relevant Orders   AMB Referral to Pharmacy Medication Management   Ambulatory referral to Physical Therapy       Will refer to physical therapy and pharmacist , Pharmacy for treatment for osteo process/osteopenia  Physical therapy to help with strength to prevent future falls Follow up plan: Return in about 3 months (around 07/19/2022), or if symptoms worsen or fail to improve, for Recheck strength and falls.  Counseling provided for all of the vaccine components Orders Placed This Encounter  Procedures   AMB Referral to Pharmacy Medication Management   Ambulatory referral to Physical Therapy    Caryl Pina, MD [facility] 04/20/2022, 10:49 AM

## 2022-04-21 ENCOUNTER — Telehealth: Payer: Self-pay | Admitting: Radiology

## 2022-04-21 ENCOUNTER — Other Ambulatory Visit: Payer: Self-pay | Admitting: *Deleted

## 2022-04-21 MED ORDER — LISINOPRIL 40 MG PO TABS: 40.0000 mg | ORAL_TABLET | Freq: Every day | ORAL | 3 refills | Status: DC

## 2022-04-21 NOTE — Progress Notes (Signed)
  Care Coordination  Note  04/21/2022 Name: Laura Dalton MRN: [id number] DOB: 10-22-1950  Laura Dalton is a 72 y.o. year old female who is a primary care patient of Dettinger, Fransisca Kaufmann, MD. I reached out to America Brown by phone today to offer care coordination services.      [name] was given information about Care Coordination services today including:  The Care Coordination services include support from the care team which includes your Nurse Coordinator, Clinical Social Worker, or Pharmacist.  The Care Coordination team is here to help remove barriers to the health concerns and goals most important to you. Care Coordination services are voluntary and the patient may decline or stop services at any time by request to their care team member.   Patient agreed to services and verbal consent obtained.   Follow up plan: Telephone appointment with care coordination team member scheduled for:05/16/2022  Noreene Larsson, [place], [place] [address] Direct Dial: [phone] [email or web address]

## 2022-04-21 NOTE — Telephone Encounter (Signed)
Received paperwork from [facility] stating patient has switched to their pharmacy and requesting signature for scripts with refills of Tramadol. Also requested [facility] be added to EHR for patient.  It is in chart for patient's pharmacy information.  I noted on paperwork refill(s) not authorized for Tramadol. Too early for refill and no additional refills warranted. Faxed back to [facility].

## 2022-04-26 ENCOUNTER — Other Ambulatory Visit: Payer: Self-pay

## 2022-04-26 ENCOUNTER — Ambulatory Visit: Payer: Medicare Other | Attending: Family Medicine

## 2022-04-26 DIAGNOSIS — M546 Pain in thoracic spine: Secondary | ICD-10-CM | POA: Insufficient documentation

## 2022-04-26 DIAGNOSIS — S22080S Wedge compression fracture of T11-T12 vertebra, sequela: Secondary | ICD-10-CM | POA: Diagnosis not present

## 2022-04-26 DIAGNOSIS — R2681 Unsteadiness on feet: Secondary | ICD-10-CM | POA: Insufficient documentation

## 2022-04-26 DIAGNOSIS — M8588 Other specified disorders of bone density and structure, other site: Secondary | ICD-10-CM | POA: Diagnosis not present

## 2022-04-26 DIAGNOSIS — M6281 Muscle weakness (generalized): Secondary | ICD-10-CM | POA: Insufficient documentation

## 2022-04-26 DIAGNOSIS — R296 Repeated falls: Secondary | ICD-10-CM | POA: Diagnosis not present

## 2022-04-26 NOTE — Therapy (Signed)
OUTPATIENT PHYSICAL THERAPY LOWER EXTREMITY EVALUATION   Patient Name: Laura Dalton MRN: [id number] DOB:October 04, 1950, 72 y.o., female Today's Date: 04/26/2022  END OF SESSION:  PT End of Session - 04/26/22 1302     Visit Number 1    Number of Visits 12    Date for PT Re-Evaluation 07/07/22    PT Start Time 1303    PT Stop Time 1339    PT Time Calculation (min) 36 min    Activity Tolerance Patient tolerated treatment well    Behavior During Therapy WFL for tasks assessed/performed             Past Medical History:  Diagnosis Date   Bursitis of hip    Diabetes mellitus without complication ([place])    type II   Hyperlipidemia    Hypertension    Hypothyroidism    Low back pain    benign turmor back   Stroke ([place])    Vitamin D deficiency disease    Past Surgical History:  Procedure Laterality Date   [place]   turmor on back    CHOLECYSTECTOMY     LEG SURGERY Right    "clot and had drain in leg  rt lower leg   Patient Active Problem List   Diagnosis Date Noted   Thoracic compression fracture ([place]) 04/13/2022   Lumbar compression fracture ([place]) 04/13/2022   Memory changes 08/23/2021   Degeneration of spine 05/07/2017   Obesity (BMI 30.0-34.9) 03/12/2015   Hypothyroidism 07/29/2014   Osteoarthritis of right shoulder 01/21/2014   Leg length discrepancy 11/04/2013   Lumbar post-laminectomy syndrome 11/04/2013   Diabetic peripheral neuropathy associated with type 2 diabetes mellitus ([place]) 11/04/2013   COPD (chronic obstructive pulmonary disease) ([place]) 10/01/2012   Chronic back pain 10/01/2012   Hypertension associated with diabetes ([place]) 10/01/2012   DM (diabetes mellitus) ([place]) 10/01/2012   Vitamin D deficiency 10/01/2012   Hyperlipidemia associated with type 2 diabetes mellitus ([place]) 10/01/2012   Dyslipidemia due to type 2 diabetes mellitus ([place]) 10/01/2012   REFERRING PROVIDER: Dettinger, Fransisca Kaufmann, MD   REFERRING DIAG:  Compression fracture of T12 vertebra, sequela; Recurrent falls; Osteopenia of lumbar spine   THERAPY DIAG:  Unsteadiness on feet  Muscle weakness (generalized)  Pain in thoracic spine  Rationale for Evaluation and Treatment: Rehabilitation  ONSET DATE: 2 weeks ago   SUBJECTIVE:   SUBJECTIVE STATEMENT: Patient reports that she fell about 2 weeks ago and broke a bone in her back. She was walking around her house when she lost her balance and fell. She then fell again about a week later in her house.   PERTINENT HISTORY: Allergies, hypertension, diabetes, COPD, osteoarthritis, chronic low back pain, and history of a stroke. PAIN:  Are you having pain? Yes: NPRS scale: 7/10 Pain location: back radiating into her chest Pain description: aching and shooting Aggravating factors: bending over, sitting too long Relieving factors: rest  PRECAUTIONS: Fall  WEIGHT BEARING RESTRICTIONS: No  FALLS:  Has patient fallen in last 6 months? Yes. Number of falls 2  LIVING ENVIRONMENT: Lives with: lives alone Lives in: House/apartment Stairs: No Has following equipment at home: Single point cane, Environmental consultant - 4 wheeled, and Ramped entry  OCCUPATION: retired  PLOF: Independent  PATIENT GOALS: improved safety and not fall, be able to go up and down 2 steps, and get back to doing her crafts   NEXT MD VISIT: 07/17/22  OBJECTIVE:   DIAGNOSTIC FINDINGS: 04/05/22 Thoracic  x-ray  IMPRESSION: Mild inferior endplate compression fractures are likely at T12 and L1. The fractures are new since 2020 but cannot be definitively characterized.  COGNITION: Overall cognitive status: Within functional limits for tasks assessed     SENSATION: Patient reports no numbness or tingling  POSTURE: rounded shoulders, forward head, decreased lumbar lordosis, increased thoracic kyphosis, and flexed trunk   LOWER EXTREMITY ROM: WFL for activities assessed  LOWER EXTREMITY MMT:  MMT Right eval Left eval   Hip flexion 4-/5 4-/5  Hip extension    Hip abduction    Hip adduction    Hip internal rotation    Hip external rotation    Knee flexion 4/5 4/5  Knee extension 4+/5 4+/5  Ankle dorsiflexion 3+/5 3+/5  Ankle plantarflexion    Ankle inversion    Ankle eversion     (Blank rows = not tested)  FUNCTIONAL TESTS:  5 times sit to stand: 52.67 seconds with UE assistance from armrests Timed up and go (TUG): 31.79 seconds with SPC  Gait speed: 0.31 m/s  GAIT: Assistive device utilized: Single point cane Level of assistance: Modified independence Comments: Flexed trunk, step through pattern, decreased stride length, minimal hip extension bilaterally, and minimal heel strike bilaterally   TODAY'S TREATMENT:                                                                                                                              DATE:     PATIENT EDUCATION:  Education details: Goals for therapy, prognosis, healing, anatomy, and plan of care Person educated: Patient Education method: Explanation Education comprehension: verbalized understanding  HOME EXERCISE PROGRAM:   ASSESSMENT:  CLINICAL IMPRESSION: Patient is a 72 y.o. female who was seen today for physical therapy evaluation and treatment for thoracic spine pain secondary to multiple falls.  She presented with moderate to high pain severity and irritability with prolonged sitting being the most aggravating to her familiar symptoms.  She is at a high risk of falling as evidenced by her objective measures and history of falling.  She also fatigued quickly with functional tests such as 5 times sit to stands.  Recommend that she continue with skilled physical therapy to address her impairments to maximize her safety and functional mobility.  OBJECTIVE IMPAIRMENTS: Abnormal gait, decreased activity tolerance, decreased balance, decreased mobility, difficulty walking, decreased strength, hypomobility, impaired flexibility, postural  dysfunction, and pain.   ACTIVITY LIMITATIONS: carrying, lifting, bending, sitting, standing, squatting, stairs, transfers, bed mobility, and locomotion level  PARTICIPATION LIMITATIONS: meal prep, cleaning, laundry, shopping, community activity, and yard work  PERSONAL FACTORS: Fitness, Time since onset of injury/illness/exacerbation, and 3+ comorbidities: Allergies, hypertension, diabetes, COPD, osteoarthritis, chronic low back pain, and history of a stroke.  are also affecting patient's functional outcome.   REHAB POTENTIAL: Fair    CLINICAL DECISION MAKING: Evolving/moderate complexity  EVALUATION COMPLEXITY: Moderate   GOALS: Goals reviewed with patient? Yes  SHORT TERM GOALS: Target date: 05/17/22  Patient will be independent with her initial HEP. Baseline: Goal status: INITIAL  2.  Patient will complete her daily activities without her familiar back pain exceeding 5/10. Baseline:  Goal status: INITIAL  3.  Patient will be able to demonstrate improved gait speed to at least 0.5 m/s. Baseline:  Goal status: INITIAL  4.  Patient will improve her 5 times sit to stand to 40 seconds or less for improved lower extremity power and endurance. Baseline:  Goal status: INITIAL  5.  Patient will improve her timed up and go to 20 seconds or less. Baseline:  Goal status: INITIAL  LONG TERM GOALS: Target date: 06/07/22  Patient will be independent with her advanced HEP. Baseline:  Goal status: INITIAL  2.  Patient will be able to demonstrate improved gait speed to at least 0.8 m/s for improved safety with household mobility. Baseline:  Goal status: INITIAL  3.  Patient will be able to demonstrate safe navigation of at least 2 steps for improved confidence and safety needed to get into her workshop. Baseline:  Goal status: INITIAL  4.  Patient will improve her timed up and go to 13 seconds or less for reduced fall risk. Baseline:  Goal status: INITIAL  5.  Patient will  improve her 5 times sit to stand to 30 seconds or less for improved lower extremity power. Baseline:  Goal status: INITIAL  PLAN:  PT FREQUENCY: 2x/week  PT DURATION: 6 weeks  PLANNED INTERVENTIONS: Therapeutic exercises, Therapeutic activity, Neuromuscular re-education, Balance training, Gait training, Patient/Family education, Self Care, Joint mobilization, Stair training, Electrical stimulation, Spinal mobilization, Cryotherapy, Moist heat, Manual therapy, and Re-evaluation  PLAN FOR NEXT SESSION: nustep, lower extremity strengthening, balance interventions, and modalities as needed   Darlin Coco, PT 04/26/2022, 2:51 PM

## 2022-05-02 ENCOUNTER — Ambulatory Visit: Payer: Medicare Other

## 2022-05-02 DIAGNOSIS — S22080S Wedge compression fracture of T11-T12 vertebra, sequela: Secondary | ICD-10-CM | POA: Diagnosis not present

## 2022-05-02 DIAGNOSIS — M8588 Other specified disorders of bone density and structure, other site: Secondary | ICD-10-CM | POA: Diagnosis not present

## 2022-05-02 DIAGNOSIS — S32000A Wedge compression fracture of unspecified lumbar vertebra, initial encounter for closed fracture: Secondary | ICD-10-CM | POA: Diagnosis not present

## 2022-05-02 DIAGNOSIS — R2681 Unsteadiness on feet: Secondary | ICD-10-CM

## 2022-05-02 DIAGNOSIS — R296 Repeated falls: Secondary | ICD-10-CM | POA: Diagnosis not present

## 2022-05-02 DIAGNOSIS — J449 Chronic obstructive pulmonary disease, unspecified: Secondary | ICD-10-CM | POA: Diagnosis not present

## 2022-05-02 DIAGNOSIS — M6281 Muscle weakness (generalized): Secondary | ICD-10-CM

## 2022-05-02 DIAGNOSIS — E1142 Type 2 diabetes mellitus with diabetic polyneuropathy: Secondary | ICD-10-CM | POA: Diagnosis not present

## 2022-05-02 DIAGNOSIS — S22000A Wedge compression fracture of unspecified thoracic vertebra, initial encounter for closed fracture: Secondary | ICD-10-CM | POA: Diagnosis not present

## 2022-05-02 DIAGNOSIS — M546 Pain in thoracic spine: Secondary | ICD-10-CM | POA: Diagnosis not present

## 2022-05-02 DIAGNOSIS — F32A Depression, unspecified: Secondary | ICD-10-CM | POA: Diagnosis not present

## 2022-05-02 DIAGNOSIS — R1319 Other dysphagia: Secondary | ICD-10-CM | POA: Diagnosis not present

## 2022-05-02 DIAGNOSIS — Z515 Encounter for palliative care: Secondary | ICD-10-CM | POA: Diagnosis not present

## 2022-05-02 NOTE — Therapy (Signed)
OUTPATIENT PHYSICAL THERAPY LOWER EXTREMITY EVALUATION   Patient Name: Laura Dalton MRN: [id number] DOB:Aug 26, 1950, 72 y.o., female Today's Date: 05/02/2022  END OF SESSION:  PT End of Session - 05/02/22 1305     Visit Number 2    Number of Visits 12    Date for PT Re-Evaluation 07/07/22    PT Start Time 1300    PT Stop Time [id number]    PT Time Calculation (min) 45 min    Activity Tolerance Patient tolerated treatment well    Behavior During Therapy WFL for tasks assessed/performed             Past Medical History:  Diagnosis Date   Bursitis of hip    Diabetes mellitus without complication ([place])    type II   Hyperlipidemia    Hypertension    Hypothyroidism    Low back pain    benign turmor back   Stroke ([place])    Vitamin D deficiency disease    Past Surgical History:  Procedure Laterality Date   [place]   turmor on back    CHOLECYSTECTOMY     LEG SURGERY Right    "clot and had drain in leg  rt lower leg   Patient Active Problem List   Diagnosis Date Noted   Thoracic compression fracture ([place]) 04/13/2022   Lumbar compression fracture ([place]) 04/13/2022   Memory changes 08/23/2021   Degeneration of spine 05/07/2017   Obesity (BMI 30.0-34.9) 03/12/2015   Hypothyroidism 07/29/2014   Osteoarthritis of right shoulder 01/21/2014   Leg length discrepancy 11/04/2013   Lumbar post-laminectomy syndrome 11/04/2013   Diabetic peripheral neuropathy associated with type 2 diabetes mellitus ([place]) 11/04/2013   COPD (chronic obstructive pulmonary disease) ([place]) 10/01/2012   Chronic back pain 10/01/2012   Hypertension associated with diabetes ([place]) 10/01/2012   DM (diabetes mellitus) ([place]) 10/01/2012   Vitamin D deficiency 10/01/2012   Hyperlipidemia associated with type 2 diabetes mellitus ([place]) 10/01/2012   Dyslipidemia due to type 2 diabetes mellitus ([place]) 10/01/2012   REFERRING PROVIDER: Dettinger, Fransisca Kaufmann, MD   REFERRING DIAG:  Compression fracture of T12 vertebra, sequela; Recurrent falls; Osteopenia of lumbar spine   THERAPY DIAG:  Unsteadiness on feet  Muscle weakness (generalized)  Pain in thoracic spine  Rationale for Evaluation and Treatment: Rehabilitation  ONSET DATE: 2 weeks ago   SUBJECTIVE:   SUBJECTIVE STATEMENT: Pt denies any pain today.  Pt ambulates into facility with SPC.  PERTINENT HISTORY: Allergies, hypertension, diabetes, COPD, osteoarthritis, chronic low back pain, and history of a stroke. PAIN:  Are you having pain? No  PRECAUTIONS: Fall  WEIGHT BEARING RESTRICTIONS: No  FALLS:  Has patient fallen in last 6 months? Yes. Number of falls 2  LIVING ENVIRONMENT: Lives with: lives alone Lives in: House/apartment Stairs: No Has following equipment at home: Single point cane, Environmental consultant - 4 wheeled, and Ramped entry  OCCUPATION: retired  PLOF: Independent  PATIENT GOALS: improved safety and not fall, be able to go up and down 2 steps, and get back to doing her crafts   NEXT MD VISIT: 07/17/22  OBJECTIVE:   DIAGNOSTIC FINDINGS: 04/05/22 Thoracic x-ray  IMPRESSION: Mild inferior endplate compression fractures are likely at T12 and L1. The fractures are new since 2020 but cannot be definitively characterized.  COGNITION: Overall cognitive status: Within functional limits for tasks assessed     SENSATION: Patient reports no numbness or tingling  POSTURE: rounded shoulders, forward head, decreased  lumbar lordosis, increased thoracic kyphosis, and flexed trunk   LOWER EXTREMITY ROM: WFL for activities assessed  LOWER EXTREMITY MMT:  MMT Right eval Left eval  Hip flexion 4-/5 4-/5  Hip extension    Hip abduction    Hip adduction    Hip internal rotation    Hip external rotation    Knee flexion 4/5 4/5  Knee extension 4+/5 4+/5  Ankle dorsiflexion 3+/5 3+/5  Ankle plantarflexion    Ankle inversion    Ankle eversion     (Blank rows = not tested)  FUNCTIONAL  TESTS:  5 times sit to stand: 52.67 seconds with UE assistance from armrests Timed up and go (TUG): 31.79 seconds with SPC  Gait speed: 0.31 m/s  GAIT: Assistive device utilized: Single point cane Level of assistance: Modified independence Comments: Flexed trunk, step through pattern, decreased stride length, minimal hip extension bilaterally, and minimal heel strike bilaterally   TODAY'S TREATMENT:                                                                                                                              DATE:                                       EXERCISE LOG  Exercise Repetitions and Resistance Comments  Nustep Lvl 3 x 15 mins   Heel/Toe Raises X20 reps each   Rockerboard X2 mins   LAQ 2# x 20 reps bil   Seated Marches 2# x 20 reps bils   Ball Squeezes X3 mins   Clams Yellow x 3 mins   Ham Curls Yellow x 20 reps bil   STS X 10 reps    Blank cell = exercise not performed today   PATIENT EDUCATION:  Education details: Goals for therapy, prognosis, healing, anatomy, and plan of care Person educated: Patient Education method: Explanation Education comprehension: verbalized understanding  HOME EXERCISE PROGRAM:   ASSESSMENT:  CLINICAL IMPRESSION: Pt arrives for today's treatment session denying any pain.  Pt introduced to Nustep for warm up today with minimal fatigue.  Pt instructed in standing exercises to increase strength and function.  Pt requiring BUE support with all reps.  Pt requiring min cues for proper technique with all newly added exercises.  Pt denied any pain at completion of today's treatment session, but does endorse increased fatigue.  OBJECTIVE IMPAIRMENTS: Abnormal gait, decreased activity tolerance, decreased balance, decreased mobility, difficulty walking, decreased strength, hypomobility, impaired flexibility, postural dysfunction, and pain.   ACTIVITY LIMITATIONS: carrying, lifting, bending, sitting, standing, squatting, stairs,  transfers, bed mobility, and locomotion level  PARTICIPATION LIMITATIONS: meal prep, cleaning, laundry, shopping, community activity, and yard work  PERSONAL FACTORS: Fitness, Time since onset of injury/illness/exacerbation, and 3+ comorbidities: Allergies, hypertension, diabetes, COPD, osteoarthritis, chronic low back pain, and history of a stroke.  are also affecting patient's functional  outcome.   REHAB POTENTIAL: Fair    CLINICAL DECISION MAKING: Evolving/moderate complexity  EVALUATION COMPLEXITY: Moderate   GOALS: Goals reviewed with patient? Yes  SHORT TERM GOALS: Target date: 05/17/22 Patient will be independent with her initial HEP. Baseline: Goal status: INITIAL  2.  Patient will complete her daily activities without her familiar back pain exceeding 5/10. Baseline:  Goal status: INITIAL  3.  Patient will be able to demonstrate improved gait speed to at least 0.5 m/s. Baseline:  Goal status: INITIAL  4.  Patient will improve her 5 times sit to stand to 40 seconds or less for improved lower extremity power and endurance. Baseline:  Goal status: INITIAL  5.  Patient will improve her timed up and go to 20 seconds or less. Baseline:  Goal status: INITIAL  LONG TERM GOALS: Target date: 06/07/22  Patient will be independent with her advanced HEP. Baseline:  Goal status: INITIAL  2.  Patient will be able to demonstrate improved gait speed to at least 0.8 m/s for improved safety with household mobility. Baseline:  Goal status: INITIAL  3.  Patient will be able to demonstrate safe navigation of at least 2 steps for improved confidence and safety needed to get into her workshop. Baseline:  Goal status: INITIAL  4.  Patient will improve her timed up and go to 13 seconds or less for reduced fall risk. Baseline:  Goal status: INITIAL  5.  Patient will improve her 5 times sit to stand to 30 seconds or less for improved lower extremity power. Baseline:  Goal status:  INITIAL  PLAN:  PT FREQUENCY: 2x/week  PT DURATION: 6 weeks  PLANNED INTERVENTIONS: Therapeutic exercises, Therapeutic activity, Neuromuscular re-education, Balance training, Gait training, Patient/Family education, Self Care, Joint mobilization, Stair training, Electrical stimulation, Spinal mobilization, Cryotherapy, Moist heat, Manual therapy, and Re-evaluation  PLAN FOR NEXT SESSION: nustep, lower extremity strengthening, balance interventions, and modalities as needed   Kathrynn Ducking, PTA 05/02/2022, 2:00 PM

## 2022-05-04 ENCOUNTER — Ambulatory Visit: Payer: Medicare Other

## 2022-05-04 DIAGNOSIS — M546 Pain in thoracic spine: Secondary | ICD-10-CM | POA: Diagnosis not present

## 2022-05-04 DIAGNOSIS — R2681 Unsteadiness on feet: Secondary | ICD-10-CM

## 2022-05-04 DIAGNOSIS — M6281 Muscle weakness (generalized): Secondary | ICD-10-CM

## 2022-05-04 DIAGNOSIS — S22080S Wedge compression fracture of T11-T12 vertebra, sequela: Secondary | ICD-10-CM | POA: Diagnosis not present

## 2022-05-04 DIAGNOSIS — M8588 Other specified disorders of bone density and structure, other site: Secondary | ICD-10-CM | POA: Diagnosis not present

## 2022-05-04 DIAGNOSIS — R296 Repeated falls: Secondary | ICD-10-CM | POA: Diagnosis not present

## 2022-05-04 NOTE — Therapy (Signed)
OUTPATIENT PHYSICAL THERAPY LOWER EXTREMITY TREATMENT   Patient Name: Laura Dalton MRN: [id number] DOB:1950-04-23, 72 y.o., female Today's Date: 05/04/2022  END OF SESSION:  PT End of Session - 05/04/22 1304     Visit Number 3    Number of Visits 12    Date for PT Re-Evaluation 07/07/22    PT Start Time 1300    PT Stop Time 1341    PT Time Calculation (min) 41 min    Activity Tolerance Patient tolerated treatment well    Behavior During Therapy WFL for tasks assessed/performed             Past Medical History:  Diagnosis Date   Bursitis of hip    Diabetes mellitus without complication ([place])    type II   Hyperlipidemia    Hypertension    Hypothyroidism    Low back pain    benign turmor back   Stroke ([place])    Vitamin D deficiency disease    Past Surgical History:  Procedure Laterality Date   [place]   turmor on back    CHOLECYSTECTOMY     LEG SURGERY Right    "clot and had drain in leg  rt lower leg   Patient Active Problem List   Diagnosis Date Noted   Thoracic compression fracture ([place]) 04/13/2022   Lumbar compression fracture ([place]) 04/13/2022   Memory changes 08/23/2021   Degeneration of spine 05/07/2017   Obesity (BMI 30.0-34.9) 03/12/2015   Hypothyroidism 07/29/2014   Osteoarthritis of right shoulder 01/21/2014   Leg length discrepancy 11/04/2013   Lumbar post-laminectomy syndrome 11/04/2013   Diabetic peripheral neuropathy associated with type 2 diabetes mellitus ([place]) 11/04/2013   COPD (chronic obstructive pulmonary disease) ([place]) 10/01/2012   Chronic back pain 10/01/2012   Hypertension associated with diabetes ([place]) 10/01/2012   DM (diabetes mellitus) ([place]) 10/01/2012   Vitamin D deficiency 10/01/2012   Hyperlipidemia associated with type 2 diabetes mellitus ([place]) 10/01/2012   Dyslipidemia due to type 2 diabetes mellitus ([place]) 10/01/2012   REFERRING PROVIDER: Dettinger, Fransisca Kaufmann, MD   REFERRING DIAG:  Compression fracture of T12 vertebra, sequela; Recurrent falls; Osteopenia of lumbar spine   THERAPY DIAG:  Unsteadiness on feet  Muscle weakness (generalized)  Pain in thoracic spine  Rationale for Evaluation and Treatment: Rehabilitation  ONSET DATE: 2 weeks ago   SUBJECTIVE:   SUBJECTIVE STATEMENT: Pt denies any pain today.  Pt ambulates into facility with SPC.  PERTINENT HISTORY: Allergies, hypertension, diabetes, COPD, osteoarthritis, chronic low back pain, and history of a stroke. PAIN:  Are you having pain? No  PRECAUTIONS: Fall  WEIGHT BEARING RESTRICTIONS: No  FALLS:  Has patient fallen in last 6 months? Yes. Number of falls 2  LIVING ENVIRONMENT: Lives with: lives alone Lives in: House/apartment Stairs: No Has following equipment at home: Single point cane, Environmental consultant - 4 wheeled, and Ramped entry  OCCUPATION: retired  PLOF: Independent  PATIENT GOALS: improved safety and not fall, be able to go up and down 2 steps, and get back to doing her crafts   NEXT MD VISIT: 07/17/22  OBJECTIVE:   DIAGNOSTIC FINDINGS: 04/05/22 Thoracic x-ray  IMPRESSION: Mild inferior endplate compression fractures are likely at T12 and L1. The fractures are new since 2020 but cannot be definitively characterized.  COGNITION: Overall cognitive status: Within functional limits for tasks assessed     SENSATION: Patient reports no numbness or tingling  POSTURE: rounded shoulders, forward head, decreased  lumbar lordosis, increased thoracic kyphosis, and flexed trunk   LOWER EXTREMITY ROM: WFL for activities assessed  LOWER EXTREMITY MMT:  MMT Right eval Left eval  Hip flexion 4-/5 4-/5  Hip extension    Hip abduction    Hip adduction    Hip internal rotation    Hip external rotation    Knee flexion 4/5 4/5  Knee extension 4+/5 4+/5  Ankle dorsiflexion 3+/5 3+/5  Ankle plantarflexion    Ankle inversion    Ankle eversion     (Blank rows = not tested)  FUNCTIONAL  TESTS:  5 times sit to stand: 52.67 seconds with UE assistance from armrests Timed up and go (TUG): 31.79 seconds with SPC  Gait speed: 0.31 m/s  GAIT: Assistive device utilized: Single point cane Level of assistance: Modified independence Comments: Flexed trunk, step through pattern, decreased stride length, minimal hip extension bilaterally, and minimal heel strike bilaterally   TODAY'S TREATMENT:                                                                                                                              DATE:  05/04/22                                    EXERCISE LOG  Exercise Repetitions and Resistance Comments  Nustep Lvl 3 x 15 mins   Heel/Toe Raises X20 reps each   Rockerboard X3 mins   LAQ 2# x 25 reps bil   Seated Marches 2# x 25 reps bils   Ball Squeezes X3 mins   Clams Yellow x 3 mins   Ham Curls    STS     Blank cell = exercise not performed today   PATIENT EDUCATION:  Education details: Goals for therapy, prognosis, healing, anatomy, and plan of care Person educated: Patient Education method: Explanation Education comprehension: verbalized understanding  HOME EXERCISE PROGRAM:   ASSESSMENT:  CLINICAL IMPRESSION: Pt arrives for today's treatment session denying any pain.  Pt SHOB throughout treatment session, but SpO2 remained greater than 92% on RA throughout treatment.  Pt able to tolerate increased with a few seated exercises, but required several seated rest breaks due to fatigue and SHOB.  Pt reported 3/10 left hip pain at completion of today's treatment session.   OBJECTIVE IMPAIRMENTS: Abnormal gait, decreased activity tolerance, decreased balance, decreased mobility, difficulty walking, decreased strength, hypomobility, impaired flexibility, postural dysfunction, and pain.   ACTIVITY LIMITATIONS: carrying, lifting, bending, sitting, standing, squatting, stairs, transfers, bed mobility, and locomotion level  PARTICIPATION LIMITATIONS:  meal prep, cleaning, laundry, shopping, community activity, and yard work  PERSONAL FACTORS: Fitness, Time since onset of injury/illness/exacerbation, and 3+ comorbidities: Allergies, hypertension, diabetes, COPD, osteoarthritis, chronic low back pain, and history of a stroke.  are also affecting patient's functional outcome.   REHAB POTENTIAL: Fair    CLINICAL DECISION MAKING: Evolving/moderate complexity  EVALUATION COMPLEXITY: Moderate   GOALS: Goals reviewed with patient? Yes  SHORT TERM GOALS: Target date: 05/17/22 Patient will be independent with her initial HEP. Baseline: Goal status: INITIAL  2.  Patient will complete her daily activities without her familiar back pain exceeding 5/10. Baseline:  Goal status: INITIAL  3.  Patient will be able to demonstrate improved gait speed to at least 0.5 m/s. Baseline:  Goal status: INITIAL  4.  Patient will improve her 5 times sit to stand to 40 seconds or less for improved lower extremity power and endurance. Baseline:  Goal status: INITIAL  5.  Patient will improve her timed up and go to 20 seconds or less. Baseline:  Goal status: INITIAL  LONG TERM GOALS: Target date: 06/07/22  Patient will be independent with her advanced HEP. Baseline:  Goal status: INITIAL  2.  Patient will be able to demonstrate improved gait speed to at least 0.8 m/s for improved safety with household mobility. Baseline:  Goal status: INITIAL  3.  Patient will be able to demonstrate safe navigation of at least 2 steps for improved confidence and safety needed to get into her workshop. Baseline:  Goal status: INITIAL  4.  Patient will improve her timed up and go to 13 seconds or less for reduced fall risk. Baseline:  Goal status: INITIAL  5.  Patient will improve her 5 times sit to stand to 30 seconds or less for improved lower extremity power. Baseline:  Goal status: INITIAL  PLAN:  PT FREQUENCY: 2x/week  PT DURATION: 6 weeks  PLANNED  INTERVENTIONS: Therapeutic exercises, Therapeutic activity, Neuromuscular re-education, Balance training, Gait training, Patient/Family education, Self Care, Joint mobilization, Stair training, Electrical stimulation, Spinal mobilization, Cryotherapy, Moist heat, Manual therapy, and Re-evaluation  PLAN FOR NEXT SESSION: nustep, lower extremity strengthening, balance interventions, and modalities as needed   Kathrynn Ducking, PTA 05/04/2022, 1:44 PM

## 2022-05-09 ENCOUNTER — Ambulatory Visit: Payer: Medicare Other | Admitting: *Deleted

## 2022-05-11 ENCOUNTER — Other Ambulatory Visit: Payer: Self-pay | Admitting: *Deleted

## 2022-05-11 ENCOUNTER — Ambulatory Visit: Payer: Medicare Other

## 2022-05-11 ENCOUNTER — Other Ambulatory Visit: Payer: Self-pay

## 2022-05-11 DIAGNOSIS — B9689 Other specified bacterial agents as the cause of diseases classified elsewhere: Secondary | ICD-10-CM

## 2022-05-11 DIAGNOSIS — M546 Pain in thoracic spine: Secondary | ICD-10-CM | POA: Diagnosis not present

## 2022-05-11 DIAGNOSIS — R2681 Unsteadiness on feet: Secondary | ICD-10-CM | POA: Diagnosis not present

## 2022-05-11 DIAGNOSIS — M6281 Muscle weakness (generalized): Secondary | ICD-10-CM | POA: Diagnosis not present

## 2022-05-11 DIAGNOSIS — R296 Repeated falls: Secondary | ICD-10-CM | POA: Diagnosis not present

## 2022-05-11 DIAGNOSIS — M8588 Other specified disorders of bone density and structure, other site: Secondary | ICD-10-CM | POA: Diagnosis not present

## 2022-05-11 DIAGNOSIS — S22080S Wedge compression fracture of T11-T12 vertebra, sequela: Secondary | ICD-10-CM | POA: Diagnosis not present

## 2022-05-11 MED ORDER — ALBUTEROL SULFATE HFA 108 (90 BASE) MCG/ACT IN AERS: INHALATION_SPRAY | RESPIRATORY_TRACT | 0 refills | Status: DC

## 2022-05-11 MED ORDER — DULOXETINE HCL 30 MG PO CPEP: 30.0000 mg | ORAL_CAPSULE | Freq: Every day | ORAL | 0 refills | Status: DC

## 2022-05-11 NOTE — Therapy (Signed)
OUTPATIENT PHYSICAL THERAPY LOWER EXTREMITY TREATMENT   Patient Name: Laura Dalton MRN: [id number] DOB:1950/12/08, 72 y.o., female Today's Date: 05/11/2022  END OF SESSION:  PT End of Session - 05/11/22 1120     Visit Number 4    Number of Visits 12    Date for PT Re-Evaluation 07/07/22    PT Start Time 1115    PT Stop Time 1157    PT Time Calculation (min) 42 min    Activity Tolerance Patient tolerated treatment well    Behavior During Therapy WFL for tasks assessed/performed             Past Medical History:  Diagnosis Date   Bursitis of hip    Diabetes mellitus without complication ([place])    type II   Hyperlipidemia    Hypertension    Hypothyroidism    Low back pain    benign turmor back   Stroke ([place])    Vitamin D deficiency disease    Past Surgical History:  Procedure Laterality Date   [place]   turmor on back    CHOLECYSTECTOMY     LEG SURGERY Right    "clot and had drain in leg  rt lower leg   Patient Active Problem List   Diagnosis Date Noted   Thoracic compression fracture ([place]) 04/13/2022   Lumbar compression fracture ([place]) 04/13/2022   Memory changes 08/23/2021   Degeneration of spine 05/07/2017   Obesity (BMI 30.0-34.9) 03/12/2015   Hypothyroidism 07/29/2014   Osteoarthritis of right shoulder 01/21/2014   Leg length discrepancy 11/04/2013   Lumbar post-laminectomy syndrome 11/04/2013   Diabetic peripheral neuropathy associated with type 2 diabetes mellitus ([place]) 11/04/2013   COPD (chronic obstructive pulmonary disease) ([place]) 10/01/2012   Chronic back pain 10/01/2012   Hypertension associated with diabetes ([place]) 10/01/2012   DM (diabetes mellitus) ([place]) 10/01/2012   Vitamin D deficiency 10/01/2012   Hyperlipidemia associated with type 2 diabetes mellitus ([place]) 10/01/2012   Dyslipidemia due to type 2 diabetes mellitus ([place]) 10/01/2012   REFERRING PROVIDER: Dettinger, Fransisca Kaufmann, MD   REFERRING DIAG:  Compression fracture of T12 vertebra, sequela; Recurrent falls; Osteopenia of lumbar spine   THERAPY DIAG:  Unsteadiness on feet  Muscle weakness (generalized)  Pain in thoracic spine  Rationale for Evaluation and Treatment: Rehabilitation  ONSET DATE: 2 weeks ago   SUBJECTIVE:   SUBJECTIVE STATEMENT: Pt reports mid to low back pain today.  Pt ambulates into facility with SPC.  PERTINENT HISTORY: Allergies, hypertension, diabetes, COPD, osteoarthritis, chronic low back pain, and history of a stroke. PAIN:  Are you having pain? Yes: NPRS scale: 4/10 Pain location: mid-low back   PRECAUTIONS: Fall  WEIGHT BEARING RESTRICTIONS: No  FALLS:  Has patient fallen in last 6 months? Yes. Number of falls 2  LIVING ENVIRONMENT: Lives with: lives alone Lives in: House/apartment Stairs: No Has following equipment at home: Single point cane, Environmental consultant - 4 wheeled, and Ramped entry  OCCUPATION: retired  PLOF: Independent  PATIENT GOALS: improved safety and not fall, be able to go up and down 2 steps, and get back to doing her crafts   NEXT MD VISIT: 07/17/22  OBJECTIVE:   DIAGNOSTIC FINDINGS: 04/05/22 Thoracic x-ray  IMPRESSION: Mild inferior endplate compression fractures are likely at T12 and L1. The fractures are new since 2020 but cannot be definitively characterized.  COGNITION: Overall cognitive status: Within functional limits for tasks assessed     SENSATION: Patient reports  no numbness or tingling  POSTURE: rounded shoulders, forward head, decreased lumbar lordosis, increased thoracic kyphosis, and flexed trunk   LOWER EXTREMITY ROM: WFL for activities assessed  LOWER EXTREMITY MMT:  MMT Right eval Left eval  Hip flexion 4-/5 4-/5  Hip extension    Hip abduction    Hip adduction    Hip internal rotation    Hip external rotation    Knee flexion 4/5 4/5  Knee extension 4+/5 4+/5  Ankle dorsiflexion 3+/5 3+/5  Ankle plantarflexion    Ankle inversion     Ankle eversion     (Blank rows = not tested)  FUNCTIONAL TESTS:  5 times sit to stand: 52.67 seconds with UE assistance from armrests Timed up and go (TUG): 31.79 seconds with SPC  Gait speed: 0.31 m/s  GAIT: Assistive device utilized: Single point cane Level of assistance: Modified independence Comments: Flexed trunk, step through pattern, decreased stride length, minimal hip extension bilaterally, and minimal heel strike bilaterally   TODAY'S TREATMENT:                                                                                                                              DATE:  05/11/22                                    EXERCISE LOG  Exercise Repetitions and Resistance Comments  Nustep Lvl 3 x 15 mins   Heel/Toe Raises 2# x 20 reps each   Rockerboard X3 mins   LAQ 2# 2 x 15 reps bil   Seated Marches 2# 2 x 15 reps bil   Ball Squeezes X3 mins   Clams Yellow x 3 mins   Ham Curls Yellow x 20 reps bil   STS     Blank cell = exercise not performed today   PATIENT EDUCATION:  Education details: Goals for therapy, prognosis, healing, anatomy, and plan of care Person educated: Patient Education method: Explanation Education comprehension: verbalized understanding  HOME EXERCISE PROGRAM:   ASSESSMENT:  CLINICAL IMPRESSION: Pt arrives for today's treatment session reporting 4/10 mid-low back pain.  Pt able to tolerated increased reps and/or resistance with all exercises today.  Pt requiring min cues for proper technique and posture with numerous exercises.  Pt complains of shortness of breath, but SpO2 remained greater that 96% throughout treatment.  Pt denied any change in pain at completion of today's treatment session.   OBJECTIVE IMPAIRMENTS: Abnormal gait, decreased activity tolerance, decreased balance, decreased mobility, difficulty walking, decreased strength, hypomobility, impaired flexibility, postural dysfunction, and pain.   ACTIVITY LIMITATIONS: carrying,  lifting, bending, sitting, standing, squatting, stairs, transfers, bed mobility, and locomotion level  PARTICIPATION LIMITATIONS: meal prep, cleaning, laundry, shopping, community activity, and yard work  PERSONAL FACTORS: Fitness, Time since onset of injury/illness/exacerbation, and 3+ comorbidities: Allergies, hypertension, diabetes, COPD, osteoarthritis, chronic low back pain, and history  of a stroke.  are also affecting patient's functional outcome.   REHAB POTENTIAL: Fair    CLINICAL DECISION MAKING: Evolving/moderate complexity  EVALUATION COMPLEXITY: Moderate   GOALS: Goals reviewed with patient? Yes  SHORT TERM GOALS: Target date: 05/17/22 Patient will be independent with her initial HEP. Baseline: Goal status: INITIAL  2.  Patient will complete her daily activities without her familiar back pain exceeding 5/10. Baseline:  Goal status: INITIAL  3.  Patient will be able to demonstrate improved gait speed to at least 0.5 m/s. Baseline:  Goal status: INITIAL  4.  Patient will improve her 5 times sit to stand to 40 seconds or less for improved lower extremity power and endurance. Baseline:  Goal status: INITIAL  5.  Patient will improve her timed up and go to 20 seconds or less. Baseline:  Goal status: INITIAL  LONG TERM GOALS: Target date: 06/07/22  Patient will be independent with her advanced HEP. Baseline:  Goal status: INITIAL  2.  Patient will be able to demonstrate improved gait speed to at least 0.8 m/s for improved safety with household mobility. Baseline:  Goal status: INITIAL  3.  Patient will be able to demonstrate safe navigation of at least 2 steps for improved confidence and safety needed to get into her workshop. Baseline:  Goal status: INITIAL  4.  Patient will improve her timed up and go to 13 seconds or less for reduced fall risk. Baseline:  Goal status: INITIAL  5.  Patient will improve her 5 times sit to stand to 30 seconds or less for  improved lower extremity power. Baseline:  Goal status: INITIAL  PLAN:  PT FREQUENCY: 2x/week  PT DURATION: 6 weeks  PLANNED INTERVENTIONS: Therapeutic exercises, Therapeutic activity, Neuromuscular re-education, Balance training, Gait training, Patient/Family education, Self Care, Joint mobilization, Stair training, Electrical stimulation, Spinal mobilization, Cryotherapy, Moist heat, Manual therapy, and Re-evaluation  PLAN FOR NEXT SESSION: nustep, lower extremity strengthening, balance interventions, and modalities as needed   Kathrynn Ducking, PTA 05/11/2022, 12:02 PM

## 2022-05-16 ENCOUNTER — Ambulatory Visit: Payer: Medicare Other | Attending: Family Medicine

## 2022-05-16 ENCOUNTER — Ambulatory Visit (INDEPENDENT_AMBULATORY_CARE_PROVIDER_SITE_OTHER): Payer: Medicare Other | Admitting: Pharmacist

## 2022-05-16 DIAGNOSIS — S22080D Wedge compression fracture of T11-T12 vertebra, subsequent encounter for fracture with routine healing: Secondary | ICD-10-CM

## 2022-05-16 DIAGNOSIS — M6281 Muscle weakness (generalized): Secondary | ICD-10-CM | POA: Insufficient documentation

## 2022-05-16 DIAGNOSIS — M8580 Other specified disorders of bone density and structure, unspecified site: Secondary | ICD-10-CM

## 2022-05-16 DIAGNOSIS — R2681 Unsteadiness on feet: Secondary | ICD-10-CM | POA: Diagnosis not present

## 2022-05-16 DIAGNOSIS — M546 Pain in thoracic spine: Secondary | ICD-10-CM | POA: Insufficient documentation

## 2022-05-16 DIAGNOSIS — S32010A Wedge compression fracture of first lumbar vertebra, initial encounter for closed fracture: Secondary | ICD-10-CM

## 2022-05-16 DIAGNOSIS — S32010D Wedge compression fracture of first lumbar vertebra, subsequent encounter for fracture with routine healing: Secondary | ICD-10-CM

## 2022-05-16 DIAGNOSIS — S22080A Wedge compression fracture of T11-T12 vertebra, initial encounter for closed fracture: Secondary | ICD-10-CM

## 2022-05-16 NOTE — Therapy (Signed)
OUTPATIENT PHYSICAL THERAPY LOWER EXTREMITY TREATMENT   Patient Name: Laura Dalton MRN: [id number] DOB:1950-11-21, 72 y.o., female Today's Date: 05/16/2022  END OF SESSION:  PT End of Session - 05/16/22 1421     Visit Number 5    Number of Visits 12    Date for PT Re-Evaluation 07/07/22    PT Start Time 1430    PT Stop Time [id number]    PT Time Calculation (min) 45 min    Activity Tolerance Patient tolerated treatment well    Behavior During Therapy WFL for tasks assessed/performed              Past Medical History:  Diagnosis Date   Bursitis of hip    Diabetes mellitus without complication ([place])    type II   Hyperlipidemia    Hypertension    Hypothyroidism    Low back pain    benign turmor back   Stroke ([place])    Vitamin D deficiency disease    Past Surgical History:  Procedure Laterality Date   [place]   turmor on back    CHOLECYSTECTOMY     LEG SURGERY Right    "clot and had drain in leg  rt lower leg   Patient Active Problem List   Diagnosis Date Noted   Thoracic compression fracture ([place]) 04/13/2022   Lumbar compression fracture ([place]) 04/13/2022   Memory changes 08/23/2021   Degeneration of spine 05/07/2017   Obesity (BMI 30.0-34.9) 03/12/2015   Hypothyroidism 07/29/2014   Osteoarthritis of right shoulder 01/21/2014   Leg length discrepancy 11/04/2013   Lumbar post-laminectomy syndrome 11/04/2013   Diabetic peripheral neuropathy associated with type 2 diabetes mellitus ([place]) 11/04/2013   COPD (chronic obstructive pulmonary disease) ([place]) 10/01/2012   Chronic back pain 10/01/2012   Hypertension associated with diabetes ([place]) 10/01/2012   DM (diabetes mellitus) ([place]) 10/01/2012   Vitamin D deficiency 10/01/2012   Hyperlipidemia associated with type 2 diabetes mellitus ([place]) 10/01/2012   Dyslipidemia due to type 2 diabetes mellitus ([place]) 10/01/2012   REFERRING PROVIDER: Dettinger, Fransisca Kaufmann, MD   REFERRING DIAG:  Compression fracture of T12 vertebra, sequela; Recurrent falls; Osteopenia of lumbar spine   THERAPY DIAG:  Unsteadiness on feet  Muscle weakness (generalized)  Pain in thoracic spine  Rationale for Evaluation and Treatment: Rehabilitation  ONSET DATE: 2 weeks ago   SUBJECTIVE:   SUBJECTIVE STATEMENT: Patient reports that she fell on Saturday when she was trying to pick something up off her floor. She was hit on her head by something off her counter, but she is unsure what it was. She has begun to have pain on the left side of her ribs particularly when she takes a deep breath. She had to have the fire department come to help her get up off the floor. She tried to get up about 5 times, but she was unable and hurt her back in the process. She is planning on going to [facility] to get a x-ray after her appointment.   PERTINENT HISTORY: Allergies, hypertension, diabetes, COPD, osteoarthritis, chronic low back pain, and history of a stroke. PAIN:  Are you having pain? Yes: NPRS scale: 8/10 Pain location: mid-low back   PRECAUTIONS: Fall  WEIGHT BEARING RESTRICTIONS: No  FALLS:  Has patient fallen in last 6 months? Yes. Number of falls 2  LIVING ENVIRONMENT: Lives with: lives alone Lives in: House/apartment Stairs: No Has following equipment at home: Single point  cane, Walker - 4 wheeled, and Ramped entry  OCCUPATION: retired  PLOF: Independent  PATIENT GOALS: improved safety and not fall, be able to go up and down 2 steps, and get back to doing her crafts   NEXT MD VISIT: 07/17/22  OBJECTIVE:   DIAGNOSTIC FINDINGS: 04/05/22 Thoracic x-ray  IMPRESSION: Mild inferior endplate compression fractures are likely at T12 and L1. The fractures are new since 2020 but cannot be definitively characterized.  COGNITION: Overall cognitive status: Within functional limits for tasks assessed     SENSATION: Patient reports no numbness or tingling  POSTURE:  rounded shoulders, forward head, decreased lumbar lordosis, increased thoracic kyphosis, and flexed trunk   LOWER EXTREMITY ROM: WFL for activities assessed  LOWER EXTREMITY MMT:  MMT Right eval Left eval  Hip flexion 4-/5 4-/5  Hip extension    Hip abduction    Hip adduction    Hip internal rotation    Hip external rotation    Knee flexion 4/5 4/5  Knee extension 4+/5 4+/5  Ankle dorsiflexion 3+/5 3+/5  Ankle plantarflexion    Ankle inversion    Ankle eversion     (Blank rows = not tested)  FUNCTIONAL TESTS:  5 times sit to stand: 52.67 seconds with UE assistance from armrests Timed up and go (TUG): 31.79 seconds with SPC  Gait speed: 0.31 m/s  GAIT: Assistive device utilized: Single point cane Level of assistance: Modified independence Comments: Flexed trunk, step through pattern, decreased stride length, minimal hip extension bilaterally, and minimal heel strike bilaterally   TODAY'S TREATMENT:                                                                                                                              DATE:                                     3/5 EXERCISE LOG  Exercise Repetitions and Resistance Comments  LAQ 20 reps each    Seated heel/toe raises  30 reps each   Nustep  L1 x 15 minutes Patient reported that this felt good on her pain   Seated hip ADD isometric  2 minutes w/ 5 second hold   Seated marching  15 reps each    Seated clams 30 reps     Blank cell = exercise not performed today                                     05/11/22 EXERCISE LOG  Exercise Repetitions and Resistance Comments  Nustep Lvl 3 x 15 mins   Heel/Toe Raises 2# x 20 reps each   Rockerboard X3 mins   LAQ 2# 2 x 15 reps bil   Seated Marches 2# 2 x 15 reps bil   [employer]  mins   Clams Yellow x 3 mins   Ham Curls Yellow x 20 reps bil   STS     Blank cell = exercise not performed today   PATIENT EDUCATION:  Education details: safety at home and contacting her  physician  Person educated: Patient Education method: Explanation Education comprehension: verbalized understanding  HOME EXERCISE PROGRAM:   ASSESSMENT:  CLINICAL IMPRESSION: Patient presented to treatment reporting a fall on 05/13/22 while she was trying to pick up an object from the floor. She exhibited no signs of distress or injury. However, she reported increased left sided rib pain with deep breathing aggravating these symptoms. However, this was able to be reduced with the NuStep. She reported feeling better upon the conclusion of treatment. Recommend that she continue with skilled physical therapy to address her remaining impairments to maximize her safety and functional mobility.   OBJECTIVE IMPAIRMENTS: Abnormal gait, decreased activity tolerance, decreased balance, decreased mobility, difficulty walking, decreased strength, hypomobility, impaired flexibility, postural dysfunction, and pain.   ACTIVITY LIMITATIONS: carrying, lifting, bending, sitting, standing, squatting, stairs, transfers, bed mobility, and locomotion level  PARTICIPATION LIMITATIONS: meal prep, cleaning, laundry, shopping, community activity, and yard work  PERSONAL FACTORS: Fitness, Time since onset of injury/illness/exacerbation, and 3+ comorbidities: Allergies, hypertension, diabetes, COPD, osteoarthritis, chronic low back pain, and history of a stroke.  are also affecting patient's functional outcome.   REHAB POTENTIAL: Fair    CLINICAL DECISION MAKING: Evolving/moderate complexity  EVALUATION COMPLEXITY: Moderate   GOALS: Goals reviewed with patient? Yes  SHORT TERM GOALS: Target date: 05/17/22 Patient will be independent with her initial HEP. Baseline: Goal status: INITIAL  2.  Patient will complete her daily activities without her familiar back pain exceeding 5/10. Baseline:  Goal status: INITIAL  3.  Patient will be able to demonstrate improved gait speed to at least 0.5 m/s. Baseline:  Goal  status: INITIAL  4.  Patient will improve her 5 times sit to stand to 40 seconds or less for improved lower extremity power and endurance. Baseline:  Goal status: INITIAL  5.  Patient will improve her timed up and go to 20 seconds or less. Baseline:  Goal status: INITIAL  LONG TERM GOALS: Target date: 06/07/22  Patient will be independent with her advanced HEP. Baseline:  Goal status: INITIAL  2.  Patient will be able to demonstrate improved gait speed to at least 0.8 m/s for improved safety with household mobility. Baseline:  Goal status: INITIAL  3.  Patient will be able to demonstrate safe navigation of at least 2 steps for improved confidence and safety needed to get into her workshop. Baseline:  Goal status: INITIAL  4.  Patient will improve her timed up and go to 13 seconds or less for reduced fall risk. Baseline:  Goal status: INITIAL  5.  Patient will improve her 5 times sit to stand to 30 seconds or less for improved lower extremity power. Baseline:  Goal status: INITIAL  PLAN:  PT FREQUENCY: 2x/week  PT DURATION: 6 weeks  PLANNED INTERVENTIONS: Therapeutic exercises, Therapeutic activity, Neuromuscular re-education, Balance training, Gait training, Patient/Family education, Self Care, Joint mobilization, Stair training, Electrical stimulation, Spinal mobilization, Cryotherapy, Moist heat, Manual therapy, and Re-evaluation  PLAN FOR NEXT SESSION: nustep, lower extremity strengthening, balance interventions, and modalities as needed   Darlin Coco, PT 05/16/2022, 5:29 PM

## 2022-05-16 NOTE — Progress Notes (Signed)
   Current Height: '5\' 6"'$     Max Lifetime Height:  '5\' 7"'$  Current Weight: 188 lbs       Ethnicity: Caucasian   HPI: Does pt already have a diagnosis of:  Osteopenia?  Yes Osteoporosis?  No, but concern for osteoporosis given second compression fracture  Back Pain?  Yes       Kyphosis?  Yes Prior fracture?  Yes compression fracture of T12 and L1 vertebra (February 2024),  hip fracture (May 2023) Med(s) for Osteoporosis/Osteopenia:  None Med(s) previously tried for Osteoporosis/Osteopenia:  None                                                             PMH: Post menopausal Hysterectomy?  No Oophorectomy?  No HRT? No Steroid Use?  No - topical Thyroid med?  Yes History of cancer?  No History of digestive disorders (ie Crohn's)?  No Current or previous eating disorders?  No Last Vitamin D Result:  20.4 (2016) Last GFR Result:  67 (August 2023)   FH/SH: Family history of osteoporosis?  No Parent with history of hip fracture?  No Family history of breast cancer?  No Exercise?  Yes - goes to PT twice week Smoking?  No - former Alcohol?  No    Calcium Assessment Calcium Intake  # of servings/day  Calcium mg  Milk (8 oz) 0  x  300  = 0  Yogurt (4 oz) 0 x  200 = 0  Cheese (1 oz) 0 x  200 = 0  Other Calcium sources   '250mg'$   Ca supplement 650 = 650 mg   Estimated calcium intake per day 900 mg    DEXA Results Date of Test T-Score for AP Spine L1-L4 T-Score for Total Right Femur T-Score for Total Left Forearm  04/01/2021 Excluded due to prior spinal surgery -1.4 -0.1                  FRAX 10 year estimate from DEXA in January 2023: Total FX risk:  9.9%  (consider medication if >/= 20%) Hip FX risk:  1.5%  (consider medication if >/= 3%)  Assessment: 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   FRACTURE RISK: INCREASED  Recommendations: 1.  Discussed options including bisphosphonate vs. denosumab (Prolia). Patient agreeable to  starting denosumab (Prolia). Discussed this is a injection given every 6 months by a health care provider.   2.  recommend calcium '1200mg'$  daily through supplementation or diet and vitamin D 800 IU daily.  3.  recommend weight bearing exercise - 30 minutes at least 4 days per week - patient goes to PT twice weekly.  4.  Counseled and educated about fall risk and prevention.  Recheck DEXA:  per PCP, typically within 2 years  Time spent counseling patient:  35 minutes

## 2022-05-18 ENCOUNTER — Encounter: Payer: Self-pay | Admitting: Pharmacist

## 2022-05-18 ENCOUNTER — Encounter: Payer: Self-pay | Admitting: Physical Therapy

## 2022-05-18 ENCOUNTER — Telehealth: Payer: Self-pay | Admitting: Pharmacist

## 2022-05-18 ENCOUNTER — Ambulatory Visit: Payer: Medicare Other | Admitting: Physical Therapy

## 2022-05-18 DIAGNOSIS — R2681 Unsteadiness on feet: Secondary | ICD-10-CM | POA: Diagnosis not present

## 2022-05-18 DIAGNOSIS — M546 Pain in thoracic spine: Secondary | ICD-10-CM | POA: Diagnosis not present

## 2022-05-18 DIAGNOSIS — M8580 Other specified disorders of bone density and structure, unspecified site: Secondary | ICD-10-CM | POA: Insufficient documentation

## 2022-05-18 DIAGNOSIS — M6281 Muscle weakness (generalized): Secondary | ICD-10-CM | POA: Diagnosis not present

## 2022-05-18 MED ORDER — DENOSUMAB 60 MG/ML ~~LOC~~ SOSY: 60.0000 mg | PREFILLED_SYRINGE | SUBCUTANEOUS | 2 refills | Status: DC

## 2022-05-18 NOTE — Telephone Encounter (Signed)
New start prolia  History of 2 fractures Diagnosis codes attached to RX

## 2022-05-18 NOTE — Therapy (Signed)
OUTPATIENT PHYSICAL THERAPY LOWER EXTREMITY TREATMENT   Patient Name: Laura Dalton MRN: [id number] DOB:1950-11-28, 72 y.o., female Today's Date: 05/18/2022  END OF SESSION:  PT End of Session - 05/18/22 1303     Visit Number 6    Number of Visits 12    Date for PT Re-Evaluation 07/07/22    PT Start Time 1302    PT Stop Time 1343    PT Time Calculation (min) 41 min    Activity Tolerance Patient tolerated treatment well    Behavior During Therapy WFL for tasks assessed/performed            Past Medical History:  Diagnosis Date   Bursitis of hip    Diabetes mellitus without complication ([place])    type II   Hyperlipidemia    Hypertension    Hypothyroidism    Low back pain    benign turmor back   Stroke ([place])    Vitamin D deficiency disease    Past Surgical History:  Procedure Laterality Date   [place]   turmor on back    CHOLECYSTECTOMY     LEG SURGERY Right    "clot and had drain in leg  rt lower leg   Patient Active Problem List   Diagnosis Date Noted   Osteopenia with high risk of fracture 05/18/2022   Thoracic compression fracture ([place]) 04/13/2022   Lumbar compression fracture ([place]) 04/13/2022   Memory changes 08/23/2021   Degeneration of spine 05/07/2017   Obesity (BMI 30.0-34.9) 03/12/2015   Hypothyroidism 07/29/2014   Osteoarthritis of right shoulder 01/21/2014   Leg length discrepancy 11/04/2013   Lumbar post-laminectomy syndrome 11/04/2013   Diabetic peripheral neuropathy associated with type 2 diabetes mellitus ([place]) 11/04/2013   COPD (chronic obstructive pulmonary disease) ([place]) 10/01/2012   Chronic back pain 10/01/2012   Hypertension associated with diabetes ([place]) 10/01/2012   DM (diabetes mellitus) ([place]) 10/01/2012   Vitamin D deficiency 10/01/2012   Hyperlipidemia associated with type 2 diabetes mellitus ([place]) 10/01/2012   Dyslipidemia due to type 2 diabetes mellitus ([place]) 10/01/2012   REFERRING PROVIDER:  Dettinger, Fransisca Kaufmann, MD   REFERRING DIAG: Compression fracture of T12 vertebra, sequela; Recurrent falls; Osteopenia of lumbar spine   THERAPY DIAG:  Unsteadiness on feet  Muscle weakness (generalized)  Pain in thoracic spine  Rationale for Evaluation and Treatment: Rehabilitation  ONSET DATE: 2 weeks ago   SUBJECTIVE:   SUBJECTIVE STATEMENT: Patient presented in clinic with reports of feeling better. Did not go see PCP regarding her fall as she felt better when she left therapy last visit. PERTINENT HISTORY: Allergies, hypertension, diabetes, COPD, osteoarthritis, chronic low back pain, and history of a stroke.  PAIN: Are you having pain? No.  PRECAUTIONS: Fall  PATIENT GOALS: improved safety and not fall, be able to go up and down 2 steps, and get back to doing her crafts   NEXT MD VISIT: 07/17/22  OBJECTIVE:   DIAGNOSTIC FINDINGS: 04/05/22 Thoracic x-ray  IMPRESSION: Mild inferior endplate compression fractures are likely at T12 and L1. The fractures are new since 2020 but cannot be definitively characterized.  POSTURE: rounded shoulders, forward head, decreased lumbar lordosis, increased thoracic kyphosis, and flexed trunk   LOWER EXTREMITY MMT:  MMT Right eval Left eval  Hip flexion 4-/5 4-/5  Hip extension    Hip abduction    Hip adduction    Hip internal rotation    Hip external rotation    Knee  flexion 4/5 4/5  Knee extension 4+/5 4+/5  Ankle dorsiflexion 3+/5 3+/5  Ankle plantarflexion    Ankle inversion    Ankle eversion     (Blank rows = not tested)  FUNCTIONAL TESTS:  5 times sit to stand: 52.67 seconds with UE assistance from armrests Timed up and go (TUG): 31.79 seconds with SPC  Gait speed: 0.31 m/s  GAIT: Assistive device utilized: Single point cane Level of assistance: Modified independence Comments: Flexed trunk, step through pattern, decreased stride length, minimal hip extension bilaterally, and minimal heel strike  bilaterally  TODAY'S TREATMENT:                                                                                                                              DATE:                         3/7 EXERCISE LOG  Exercise Repetitions and Resistance Comments  LAQ 20 reps each    Seated heel/toe raises  20 reps each   Nustep  L1 x 15 minutes   Seated hip ADD isometric  2 minutes w/ 5 second hold   Seated marching  15 reps each    Seated clams 30 reps yellow theraband    Blank cell = exercise not performed today   PATIENT EDUCATION:  Education details: safety at home and contacting her physician  Person educated: Patient Education method: Explanation Education comprehension: verbalized understanding  HOME EXERCISE PROGRAM:  ASSESSMENT:  CLINICAL IMPRESSION: Patient presented in clinic with no pain and no falls but very concerned regarding the continued falls. Patient able to tolerate light therex session with rest breaks as indicated by patient. SOB noted with conversation. Poor family support also reported via patient. Patient observed with transfers and gait with slow, guarded pace.  OBJECTIVE IMPAIRMENTS: Abnormal gait, decreased activity tolerance, decreased balance, decreased mobility, difficulty walking, decreased strength, hypomobility, impaired flexibility, postural dysfunction, and pain.   ACTIVITY LIMITATIONS: carrying, lifting, bending, sitting, standing, squatting, stairs, transfers, bed mobility, and locomotion level  PARTICIPATION LIMITATIONS: meal prep, cleaning, laundry, shopping, community activity, and yard work  PERSONAL FACTORS: Fitness, Time since onset of injury/illness/exacerbation, and 3+ comorbidities: Allergies, hypertension, diabetes, COPD, osteoarthritis, chronic low back pain, and history of a stroke.  are also affecting patient's functional outcome.   REHAB POTENTIAL: Fair    CLINICAL DECISION MAKING: Evolving/moderate complexity  EVALUATION COMPLEXITY:  Moderate  GOALS: Goals reviewed with patient? Yes  SHORT TERM GOALS: Target date: 05/17/22 Patient will be independent with her initial HEP. Baseline: Goal status: INITIAL  2.  Patient will complete her daily activities without her familiar back pain exceeding 5/10. Baseline:  Goal status: INITIAL  3.  Patient will be able to demonstrate improved gait speed to at least 0.5 m/s. Baseline:  Goal status: INITIAL  4.  Patient will improve her 5 times sit to stand to 40 seconds or less for improved  lower extremity power and endurance. Baseline:  Goal status: INITIAL  5.  Patient will improve her timed up and go to 20 seconds or less. Baseline:  Goal status: INITIAL  LONG TERM GOALS: Target date: 06/07/22  Patient will be independent with her advanced HEP. Baseline:  Goal status: INITIAL  2.  Patient will be able to demonstrate improved gait speed to at least 0.8 m/s for improved safety with household mobility. Baseline:  Goal status: INITIAL  3.  Patient will be able to demonstrate safe navigation of at least 2 steps for improved confidence and safety needed to get into her workshop. Baseline:  Goal status: INITIAL  4.  Patient will improve her timed up and go to 13 seconds or less for reduced fall risk. Baseline:  Goal status: INITIAL  5.  Patient will improve her 5 times sit to stand to 30 seconds or less for improved lower extremity power. Baseline:  Goal status: INITIAL  PLAN:  PT FREQUENCY: 2x/week  PT DURATION: 6 weeks  PLANNED INTERVENTIONS: Therapeutic exercises, Therapeutic activity, Neuromuscular re-education, Balance training, Gait training, Patient/Family education, Self Care, Joint mobilization, Stair training, Electrical stimulation, Spinal mobilization, Cryotherapy, Moist heat, Manual therapy, and Re-evaluation  PLAN FOR NEXT SESSION: nustep, lower extremity strengthening, balance interventions, and modalities as needed  Standley Brooking, PTA 05/18/2022,  2:26 PM

## 2022-05-22 ENCOUNTER — Other Ambulatory Visit (HOSPITAL_COMMUNITY): Payer: Self-pay

## 2022-05-22 NOTE — Telephone Encounter (Signed)
[facility] VOB initiated via [email or web address] 

## 2022-05-23 ENCOUNTER — Ambulatory Visit: Payer: Medicare Other | Admitting: Physical Therapy

## 2022-05-23 ENCOUNTER — Encounter: Payer: Self-pay | Admitting: Physical Therapy

## 2022-05-23 DIAGNOSIS — M546 Pain in thoracic spine: Secondary | ICD-10-CM | POA: Diagnosis not present

## 2022-05-23 DIAGNOSIS — M6281 Muscle weakness (generalized): Secondary | ICD-10-CM | POA: Diagnosis not present

## 2022-05-23 DIAGNOSIS — R2681 Unsteadiness on feet: Secondary | ICD-10-CM | POA: Diagnosis not present

## 2022-05-23 NOTE — Therapy (Signed)
OUTPATIENT PHYSICAL THERAPY LOWER EXTREMITY TREATMENT   Patient Name: Laura Dalton MRN: [id number] DOB:Feb 15, 1951, 72 y.o., female Today's Date: 05/23/2022  END OF SESSION:  PT End of Session - 05/23/22 1349     Visit Number 7    Number of Visits 12    Date for PT Re-Evaluation 07/07/22    PT Start Time 1347    PT Stop Time 1426    PT Time Calculation (min) 39 min    Activity Tolerance Patient tolerated treatment well    Behavior During Therapy WFL for tasks assessed/performed            Past Medical History:  Diagnosis Date   Bursitis of hip    Diabetes mellitus without complication ([place])    type II   Hyperlipidemia    Hypertension    Hypothyroidism    Low back pain    benign turmor back   Stroke ([place])    Vitamin D deficiency disease    Past Surgical History:  Procedure Laterality Date   [place]   turmor on back    CHOLECYSTECTOMY     LEG SURGERY Right    "clot and had drain in leg  rt lower leg   Patient Active Problem List   Diagnosis Date Noted   Osteopenia with high risk of fracture 05/18/2022   Thoracic compression fracture ([place]) 04/13/2022   Lumbar compression fracture ([place]) 04/13/2022   Memory changes 08/23/2021   Degeneration of spine 05/07/2017   Obesity (BMI 30.0-34.9) 03/12/2015   Hypothyroidism 07/29/2014   Osteoarthritis of right shoulder 01/21/2014   Leg length discrepancy 11/04/2013   Lumbar post-laminectomy syndrome 11/04/2013   Diabetic peripheral neuropathy associated with type 2 diabetes mellitus ([place]) 11/04/2013   COPD (chronic obstructive pulmonary disease) ([place]) 10/01/2012   Chronic back pain 10/01/2012   Hypertension associated with diabetes ([place]) 10/01/2012   DM (diabetes mellitus) ([place]) 10/01/2012   Vitamin D deficiency 10/01/2012   Hyperlipidemia associated with type 2 diabetes mellitus ([place]) 10/01/2012   Dyslipidemia due to type 2 diabetes mellitus ([place]) 10/01/2012   REFERRING PROVIDER:  Dettinger, Fransisca Kaufmann, MD   REFERRING DIAG: Compression fracture of T12 vertebra, sequela; Recurrent falls; Osteopenia of lumbar spine   THERAPY DIAG:  Unsteadiness on feet  Muscle weakness (generalized)  Pain in thoracic spine  Rationale for Evaluation and Treatment: Rehabilitation  ONSET DATE: 2 weeks ago   SUBJECTIVE:   SUBJECTIVE STATEMENT: Reports that she should have brought in her walker but no new falls.  PERTINENT HISTORY: Allergies, hypertension, diabetes, COPD, osteoarthritis, chronic low back pain, and history of a stroke.  PAIN: Are you having pain? No.  PRECAUTIONS: Fall  PATIENT GOALS: improved safety and not fall, be able to go up and down 2 steps, and get back to doing her crafts   NEXT MD VISIT: 07/17/22  OBJECTIVE:   DIAGNOSTIC FINDINGS: 04/05/22 Thoracic x-ray  IMPRESSION: Mild inferior endplate compression fractures are likely at T12 and L1. The fractures are new since 2020 but cannot be definitively characterized.  POSTURE: rounded shoulders, forward head, decreased lumbar lordosis, increased thoracic kyphosis, and flexed trunk   LOWER EXTREMITY MMT:  MMT Right eval Left eval  Hip flexion 4-/5 4-/5  Hip extension    Hip abduction    Hip adduction    Hip internal rotation    Hip external rotation    Knee flexion 4/5 4/5  Knee extension 4+/5 4+/5  Ankle dorsiflexion 3+/5 3+/5  Ankle plantarflexion    Ankle inversion    Ankle eversion     (Blank rows = not tested)  FUNCTIONAL TESTS:  5 times sit to stand: 52.67 seconds with UE assistance from armrests Timed up and go (TUG): 31.79 seconds with SPC  Gait speed: 0.31 m/s  GAIT: Assistive device utilized: Single point cane Level of assistance: Modified independence Comments: Flexed trunk, step through pattern, decreased stride length, minimal hip extension bilaterally, and minimal heel strike bilaterally  TODAY'S TREATMENT:                                                                                                                               DATE:                         3/12 EXERCISE LOG  Exercise Repetitions and Resistance Comments  LAQ 20 reps each 2#   Seated heel/toe raises  30 reps each   Nustep  L1 x 15 minutes   Seated hip ADD isometric  3 minutes w/ 5 second hold   Seated marching  15 reps each 2#   Seated clams 30 reps yellow theraband    Blank cell = exercise not performed today   PATIENT EDUCATION:  Education details: safety at home and contacting her physician  Person educated: Patient Education method: Explanation Education comprehension: verbalized understanding  HOME EXERCISE PROGRAM:  ASSESSMENT:  CLINICAL IMPRESSION: Patient presented in clinic with reports of no new falls. Patient able to tolerate mild increase in resistance with seated exercises well and liked the resistance. Patient provided a yellow theraband for home use for exercises. Patient did get distracted by environment and conversation and required verbal redirection to exercise. No other complaints following treatment. Patient does state that she has multiple AD at home for use if needed. Patient still experiencing pain in L upper flank area and under axilla region.  OBJECTIVE IMPAIRMENTS: Abnormal gait, decreased activity tolerance, decreased balance, decreased mobility, difficulty walking, decreased strength, hypomobility, impaired flexibility, postural dysfunction, and pain.   ACTIVITY LIMITATIONS: carrying, lifting, bending, sitting, standing, squatting, stairs, transfers, bed mobility, and locomotion level  PARTICIPATION LIMITATIONS: meal prep, cleaning, laundry, shopping, community activity, and yard work  PERSONAL FACTORS: Fitness, Time since onset of injury/illness/exacerbation, and 3+ comorbidities: Allergies, hypertension, diabetes, COPD, osteoarthritis, chronic low back pain, and history of a stroke.  are also affecting patient's functional outcome.   REHAB POTENTIAL:  Fair    CLINICAL DECISION MAKING: Evolving/moderate complexity  EVALUATION COMPLEXITY: Moderate  GOALS: Goals reviewed with patient? Yes  SHORT TERM GOALS: Target date: 05/17/22 Patient will be independent with her initial HEP. Baseline: Goal status: INITIAL  2.  Patient will complete her daily activities without her familiar back pain exceeding 5/10. Baseline:  Goal status: INITIAL  3.  Patient will be able to demonstrate improved gait speed to at least 0.5 m/s. Baseline:  Goal status: INITIAL  4.  Patient will improve her 5 times sit to stand to 40 seconds or less for improved lower extremity power and endurance. Baseline:  Goal status: INITIAL  5.  Patient will improve her timed up and go to 20 seconds or less. Baseline:  Goal status: INITIAL  LONG TERM GOALS: Target date: 06/07/22  Patient will be independent with her advanced HEP. Baseline:  Goal status: INITIAL  2.  Patient will be able to demonstrate improved gait speed to at least 0.8 m/s for improved safety with household mobility. Baseline:  Goal status: INITIAL  3.  Patient will be able to demonstrate safe navigation of at least 2 steps for improved confidence and safety needed to get into her workshop. Baseline:  Goal status: INITIAL  4.  Patient will improve her timed up and go to 13 seconds or less for reduced fall risk. Baseline:  Goal status: INITIAL  5.  Patient will improve her 5 times sit to stand to 30 seconds or less for improved lower extremity power. Baseline:  Goal status: INITIAL  PLAN:  PT FREQUENCY: 2x/week  PT DURATION: 6 weeks  PLANNED INTERVENTIONS: Therapeutic exercises, Therapeutic activity, Neuromuscular re-education, Balance training, Gait training, Patient/Family education, Self Care, Joint mobilization, Stair training, Electrical stimulation, Spinal mobilization, Cryotherapy, Moist heat, Manual therapy, and Re-evaluation  PLAN FOR NEXT SESSION: nustep, lower extremity  strengthening, balance interventions, and modalities as needed  Standley Brooking, PTA 05/23/2022, 3:26 PM

## 2022-05-24 ENCOUNTER — Encounter: Payer: Self-pay | Admitting: Family Medicine

## 2022-05-25 ENCOUNTER — Ambulatory Visit: Payer: Medicare Other | Admitting: Physical Therapy

## 2022-05-25 ENCOUNTER — Encounter: Payer: Self-pay | Admitting: Physical Therapy

## 2022-05-25 DIAGNOSIS — M6281 Muscle weakness (generalized): Secondary | ICD-10-CM

## 2022-05-25 DIAGNOSIS — R2681 Unsteadiness on feet: Secondary | ICD-10-CM

## 2022-05-25 DIAGNOSIS — M546 Pain in thoracic spine: Secondary | ICD-10-CM | POA: Diagnosis not present

## 2022-05-25 NOTE — Therapy (Signed)
OUTPATIENT PHYSICAL THERAPY LOWER EXTREMITY TREATMENT   Patient Name: Laura Dalton MRN: [id number] DOB:October 09, 1950, 72 y.o., female Today's Date: 05/25/2022  END OF SESSION:  PT End of Session - 05/25/22 1348     Visit Number 8    Number of Visits 12    Date for PT Re-Evaluation 07/07/22    PT Start Time [id number]    PT Stop Time [id number]    PT Time Calculation (min) 41 min    Activity Tolerance Patient tolerated treatment well    Behavior During Therapy WFL for tasks assessed/performed            Past Medical History:  Diagnosis Date   Bursitis of hip    Diabetes mellitus without complication ([place])    type II   Hyperlipidemia    Hypertension    Hypothyroidism    Low back pain    benign turmor back   Stroke ([place])    Vitamin D deficiency disease    Past Surgical History:  Procedure Laterality Date   [place]   turmor on back    CHOLECYSTECTOMY     LEG SURGERY Right    "clot and had drain in leg  rt lower leg   Patient Active Problem List   Diagnosis Date Noted   Osteopenia with high risk of fracture 05/18/2022   Thoracic compression fracture ([place]) 04/13/2022   Lumbar compression fracture ([place]) 04/13/2022   Memory changes 08/23/2021   Degeneration of spine 05/07/2017   Obesity (BMI 30.0-34.9) 03/12/2015   Hypothyroidism 07/29/2014   Osteoarthritis of right shoulder 01/21/2014   Leg length discrepancy 11/04/2013   Lumbar post-laminectomy syndrome 11/04/2013   Diabetic peripheral neuropathy associated with type 2 diabetes mellitus ([place]) 11/04/2013   COPD (chronic obstructive pulmonary disease) ([place]) 10/01/2012   Chronic back pain 10/01/2012   Hypertension associated with diabetes ([place]) 10/01/2012   DM (diabetes mellitus) ([place]) 10/01/2012   Vitamin D deficiency 10/01/2012   Hyperlipidemia associated with type 2 diabetes mellitus ([place]) 10/01/2012   Dyslipidemia due to type 2 diabetes mellitus ([place]) 10/01/2012   REFERRING PROVIDER:  Dettinger, Fransisca Kaufmann, MD   REFERRING DIAG: Compression fracture of T12 vertebra, sequela; Recurrent falls; Osteopenia of lumbar spine   THERAPY DIAG:  Unsteadiness on feet  Muscle weakness (generalized)  Pain in thoracic spine  Rationale for Evaluation and Treatment: Rehabilitation  ONSET DATE: 2 weeks ago   SUBJECTIVE:   SUBJECTIVE STATEMENT: Reports that she brought in rollator as she needed it today. Reports when standing she has a pulling sensation in mid back.  PERTINENT HISTORY: Allergies, hypertension, diabetes, COPD, osteoarthritis, chronic low back pain, and history of a stroke.  PAIN: Are you having pain? No.  PRECAUTIONS: Fall  PATIENT GOALS: improved safety and not fall, be able to go up and down 2 steps, and get back to doing her crafts   NEXT MD VISIT: 07/17/22  OBJECTIVE:   DIAGNOSTIC FINDINGS: 04/05/22 Thoracic x-ray  IMPRESSION: Mild inferior endplate compression fractures are likely at T12 and L1. The fractures are new since 2020 but cannot be definitively characterized.  POSTURE: rounded shoulders, forward head, decreased lumbar lordosis, increased thoracic kyphosis, and flexed trunk   LOWER EXTREMITY MMT:  MMT Right eval Left eval  Hip flexion 4-/5 4-/5  Hip extension    Hip abduction    Hip adduction    Hip internal rotation    Hip external rotation    Knee flexion 4/5 4/5  Knee extension 4+/5 4+/5  Ankle dorsiflexion 3+/5 3+/5  Ankle plantarflexion    Ankle inversion    Ankle eversion     (Blank rows = not tested)  FUNCTIONAL TESTS:  5 times sit to stand: 52.67 seconds with UE assistance from armrests Timed up and go (TUG): 31.79 seconds with SPC  Gait speed: 0.31 m/s  GAIT: Assistive device utilized: Single point cane Level of assistance: Modified independence Comments: Flexed trunk, step through pattern, decreased stride length, minimal hip extension bilaterally, and minimal heel strike bilaterally  TODAY'S TREATMENT:                                                                                                                               DATE:                         3/12 EXERCISE LOG  Exercise Repetitions and Resistance Comments  LAQ 20 reps each 2#   Seated heel/toe raises  30 reps each   Nustep  L2 x 15 minutes   Seated hip ADD isometric  3 minutes w/ 5 second hold   Seated marching  20 reps each 2#   Seated clams 30 reps Red theraband   Horizontal abduction Red x15 reps   D2  BUE x10 reps each   Shoulder row  Red theraband x15 reps   Shoulder extension Red x15 reps    Blank cell = exercise not performed today   PATIENT EDUCATION:  Education details: safety at home and contacting her physician  Person educated: Patient Education method: Explanation Education comprehension: verbalized understanding  HOME EXERCISE PROGRAM:  ASSESSMENT:  CLINICAL IMPRESSION: Patient presented in clinic with reports of needing her rollator today. Patient reporting a pulling sensation in thoracic region when standing. Patient able to tolerate therex fairly well but weakness and greater rest breaks required for upper back and shoulder strengthening. Patient observed with L lean seated position and standing in trunk flexion today.  Goals on-going due to back pain and pain with mobility and ADLs.  OBJECTIVE IMPAIRMENTS: Abnormal gait, decreased activity tolerance, decreased balance, decreased mobility, difficulty walking, decreased strength, hypomobility, impaired flexibility, postural dysfunction, and pain.   ACTIVITY LIMITATIONS: carrying, lifting, bending, sitting, standing, squatting, stairs, transfers, bed mobility, and locomotion level  PARTICIPATION LIMITATIONS: meal prep, cleaning, laundry, shopping, community activity, and yard work  PERSONAL FACTORS: Fitness, Time since onset of injury/illness/exacerbation, and 3+ comorbidities: Allergies, hypertension, diabetes, COPD, osteoarthritis, chronic low back pain, and  history of a stroke.  are also affecting patient's functional outcome.   REHAB POTENTIAL: Fair    CLINICAL DECISION MAKING: Evolving/moderate complexity  EVALUATION COMPLEXITY: Moderate  GOALS: Goals reviewed with patient? Yes  SHORT TERM GOALS: Target date: 05/17/22 Patient will be independent with her initial HEP. Baseline: Goal status: On-going  2.  Patient will complete her daily activities without her familiar back pain exceeding 5/10. Baseline:  Goal status:  On-going  3.  Patient will be able to demonstrate improved gait speed to at least 0.5 m/s. Baseline:  Goal status: On-going  4.  Patient will improve her 5 times sit to stand to 40 seconds or less for improved lower extremity power and endurance. Baseline:  Goal status: On-going  5.  Patient will improve her timed up and go to 20 seconds or less. Baseline:  Goal status: On-going  LONG TERM GOALS: Target date: 06/07/22  Patient will be independent with her advanced HEP. Baseline:  Goal status: On-going  2.  Patient will be able to demonstrate improved gait speed to at least 0.8 m/s for improved safety with household mobility. Baseline:  Goal status: On-going  3.  Patient will be able to demonstrate safe navigation of at least 2 steps for improved confidence and safety needed to get into her workshop. Baseline:  Goal status: On-going  4.  Patient will improve her timed up and go to 13 seconds or less for reduced fall risk. Baseline:  Goal status: On-going  5.  Patient will improve her 5 times sit to stand to 30 seconds or less for improved lower extremity power. Baseline:  Goal status: On-going  PLAN:  PT FREQUENCY: 2x/week  PT DURATION: 6 weeks  PLANNED INTERVENTIONS: Therapeutic exercises, Therapeutic activity, Neuromuscular re-education, Balance training, Gait training, Patient/Family education, Self Care, Joint mobilization, Stair training, Electrical stimulation, Spinal mobilization, Cryotherapy,  Moist heat, Manual therapy, and Re-evaluation  PLAN FOR NEXT SESSION: nustep, lower extremity strengthening, balance interventions, and modalities as needed  Standley Brooking, PTA 05/25/2022, 3:28 PM

## 2022-05-30 ENCOUNTER — Ambulatory Visit: Payer: Medicare Other | Admitting: Physical Therapy

## 2022-05-30 ENCOUNTER — Encounter: Payer: Self-pay | Admitting: Physical Therapy

## 2022-05-30 DIAGNOSIS — R2681 Unsteadiness on feet: Secondary | ICD-10-CM | POA: Diagnosis not present

## 2022-05-30 DIAGNOSIS — M6281 Muscle weakness (generalized): Secondary | ICD-10-CM | POA: Diagnosis not present

## 2022-05-30 DIAGNOSIS — M546 Pain in thoracic spine: Secondary | ICD-10-CM

## 2022-05-30 NOTE — Therapy (Addendum)
OUTPATIENT PHYSICAL THERAPY LOWER EXTREMITY TREATMENT   Patient Name: Laura Dalton MRN: [id number] DOB:03/12/1951, 72 y.o., female Today's Date: 05/30/2022  END OF SESSION:  PT End of Session - 05/30/22 1444     Visit Number 9    Number of Visits 12    Date for PT Re-Evaluation 07/07/22    PT Start Time 1432    PT Stop Time 1513    PT Time Calculation (min) 41 min    Activity Tolerance Patient tolerated treatment well    Behavior During Therapy WFL for tasks assessed/performed            Past Medical History:  Diagnosis Date   Bursitis of hip    Diabetes mellitus without complication (HCC)    type II   Hyperlipidemia    Hypertension    Hypothyroidism    Low back pain    benign turmor back   Stroke (HCC)    Vitamin D deficiency disease    Past Surgical History:  Procedure Laterality Date   APPENDECTOMY     BACK SURGERY  1996   turmor on back    CHOLECYSTECTOMY     LEG SURGERY Right    "clot and had drain in leg  rt lower leg   Patient Active Problem List   Diagnosis Date Noted   Osteopenia with high risk of fracture 05/18/2022   Thoracic compression fracture (HCC) 04/13/2022   Lumbar compression fracture (HCC) 04/13/2022   Memory changes 08/23/2021   Degeneration of spine 05/07/2017   Obesity (BMI 30.0-34.9) 03/12/2015   Hypothyroidism 07/29/2014   Osteoarthritis of right shoulder 01/21/2014   Leg length discrepancy 11/04/2013   Lumbar post-laminectomy syndrome 11/04/2013   Diabetic peripheral neuropathy associated with type 2 diabetes mellitus (HCC) 11/04/2013   COPD (chronic obstructive pulmonary disease) (HCC) 10/01/2012   Chronic back pain 10/01/2012   Hypertension associated with diabetes (HCC) 10/01/2012   DM (diabetes mellitus) (HCC) 10/01/2012   Vitamin D deficiency 10/01/2012   Hyperlipidemia associated with type 2 diabetes mellitus (HCC) 10/01/2012   Dyslipidemia due to type 2 diabetes mellitus (HCC) 10/01/2012   REFERRING PROVIDER:  Dettinger, Elige Radon, MD   REFERRING DIAG: Compression fracture of T12 vertebra, sequela; Recurrent falls; Osteopenia of lumbar spine   THERAPY DIAG:  Unsteadiness on feet  Muscle weakness (generalized)  Pain in thoracic spine  Rationale for Evaluation and Treatment: Rehabilitation  ONSET DATE: 2 weeks ago   SUBJECTIVE:   SUBJECTIVE STATEMENT: Had increased pain begin when she started PT today. Has found that when she leans over to pick something up her level wobble. Patient reports that she thinks that is the cause of her falls.  PERTINENT HISTORY: Allergies, hypertension, diabetes, COPD, osteoarthritis, chronic low back pain, and history of a stroke.  PAIN: Are you having pain? 8/10 R low back  PRECAUTIONS: Fall  PATIENT GOALS: improved safety and not fall, be able to go up and down 2 steps, and get back to doing her crafts   NEXT MD VISIT: 06/01/22 with Dr. Ophelia Charter  OBJECTIVE:   DIAGNOSTIC FINDINGS: 04/05/22 Thoracic x-ray  IMPRESSION: Mild inferior endplate compression fractures are likely at T12 and L1. The fractures are new since 2020 but cannot be definitively characterized.  POSTURE: rounded shoulders, forward head, decreased lumbar lordosis, increased thoracic kyphosis, and flexed trunk   LOWER EXTREMITY MMT:  MMT Right eval Left eval  Hip flexion 4-/5 4-/5  Hip extension    Hip abduction    Hip adduction  Hip internal rotation    Hip external rotation    Knee flexion 4/5 4/5  Knee extension 4+/5 4+/5  Ankle dorsiflexion 3+/5 3+/5  Ankle plantarflexion    Ankle inversion    Ankle eversion     (Blank rows = not tested)  FUNCTIONAL TESTS:  5 times sit to stand: 52.67 seconds with UE assistance from armrests Timed up and go (TUG): 31.79 seconds with SPC  Gait speed: 0.31 m/s  GAIT: Assistive device utilized: Single point cane Level of assistance: Modified independence Comments: Flexed trunk, step through pattern, decreased stride length, minimal  hip extension bilaterally, and minimal heel strike bilaterally  TODAY'S TREATMENT:                                                                                                                              DATE:                         3/19 EXERCISE LOG  Exercise Repetitions and Resistance Comments  LAQ 20 reps each 3#   Nustep  L2 x 15 minutes   Seated hip ADD isometric  3 minutes w/ 5 second hold   Seated marching  20 reps each 3#   Seated clams 30 reps Red theraband   Seated HS curl Red theraband x20 reps   Sit to stand X8 reps; 50% use of BLE Limited via fatigue           Blank cell = exercise not performed today   PATIENT EDUCATION:  Education details: safety at home and contacting her physician  Person educated: Patient Education method: Explanation Education comprehension: verbalized understanding  HOME EXERCISE PROGRAM:  ASSESSMENT:  CLINICAL IMPRESSION: Patient presented in clinic with reports of increased back pain when on nustep. Patient able to tolerate therex with mild redirection of concentration back to therex but increased resistance utilized to improve LE strength. Limited tolerance for sit to stands due to UE support and fatigue. Patient reports she refuses to sit in a chair that does not have UE support. Patient reports no improvement with back pain in general but pain was better than when she arrived in PT.  PHYSICAL THERAPY DISCHARGE SUMMARY  Visits from Start of Care: 9  Current functional level related to goals / functional outcomes: Patient was unable to meet her short or long term goals for physical therapy.    Remaining deficits: Pain, gait speed, and lower extremity power   Education / Equipment: HEP   Patient agrees to discharge. Patient goals were not met. Patient is being discharged due to not returning since the last visit.  Candi Leash, PT, DPT    OBJECTIVE IMPAIRMENTS: Abnormal gait, decreased activity tolerance, decreased balance,  decreased mobility, difficulty walking, decreased strength, hypomobility, impaired flexibility, postural dysfunction, and pain.   ACTIVITY LIMITATIONS: carrying, lifting, bending, sitting, standing, squatting, stairs, transfers, bed mobility, and locomotion level  PARTICIPATION LIMITATIONS: meal prep,  cleaning, laundry, shopping, community activity, and yard work  PERSONAL FACTORS: Fitness, Time since onset of injury/illness/exacerbation, and 3+ comorbidities: Allergies, hypertension, diabetes, COPD, osteoarthritis, chronic low back pain, and history of a stroke.  are also affecting patient's functional outcome.   REHAB POTENTIAL: Fair    CLINICAL DECISION MAKING: Evolving/moderate complexity  EVALUATION COMPLEXITY: Moderate  GOALS: Goals reviewed with patient? Yes  SHORT TERM GOALS: Target date: 05/17/22 Patient will be independent with her initial HEP. Baseline: Goal status: On-going  2.  Patient will complete her daily activities without her familiar back pain exceeding 5/10. Baseline:  Goal status: On-going  3.  Patient will be able to demonstrate improved gait speed to at least 0.5 m/s. Baseline:  Goal status: On-going  4.  Patient will improve her 5 times sit to stand to 40 seconds or less for improved lower extremity power and endurance. Baseline:  Goal status: On-going  5.  Patient will improve her timed up and go to 20 seconds or less. Baseline:  Goal status: On-going  LONG TERM GOALS: Target date: 06/07/22  Patient will be independent with her advanced HEP. Baseline:  Goal status: On-going  2.  Patient will be able to demonstrate improved gait speed to at least 0.8 m/s for improved safety with household mobility. Baseline:  Goal status: On-going  3.  Patient will be able to demonstrate safe navigation of at least 2 steps for improved confidence and safety needed to get into her workshop. Baseline:  Goal status: On-going  4.  Patient will improve her timed up  and go to 13 seconds or less for reduced fall risk. Baseline:  Goal status: On-going  5.  Patient will improve her 5 times sit to stand to 30 seconds or less for improved lower extremity power. Baseline:  Goal status: On-going  PLAN:  PT FREQUENCY: 2x/week  PT DURATION: 6 weeks  PLANNED INTERVENTIONS: Therapeutic exercises, Therapeutic activity, Neuromuscular re-education, Balance training, Gait training, Patient/Family education, Self Care, Joint mobilization, Stair training, Electrical stimulation, Spinal mobilization, Cryotherapy, Moist heat, Manual therapy, and Re-evaluation  PLAN FOR NEXT SESSION: nustep, lower extremity strengthening, balance interventions, and modalities as needed  Marvell Fuller, PTA 05/30/2022, 3:24 PM

## 2022-06-01 ENCOUNTER — Ambulatory Visit (INDEPENDENT_AMBULATORY_CARE_PROVIDER_SITE_OTHER): Payer: Medicare Other | Admitting: Orthopaedic Surgery

## 2022-06-01 ENCOUNTER — Encounter: Payer: Self-pay | Admitting: Orthopaedic Surgery

## 2022-06-01 ENCOUNTER — Other Ambulatory Visit (INDEPENDENT_AMBULATORY_CARE_PROVIDER_SITE_OTHER): Payer: Medicare Other

## 2022-06-01 VITALS — Ht 66.0 in | Wt 188.0 lb

## 2022-06-01 DIAGNOSIS — S32010A Wedge compression fracture of first lumbar vertebra, initial encounter for closed fracture: Secondary | ICD-10-CM | POA: Diagnosis not present

## 2022-06-01 DIAGNOSIS — S22080A Wedge compression fracture of T11-T12 vertebra, initial encounter for closed fracture: Secondary | ICD-10-CM | POA: Diagnosis not present

## 2022-06-01 MED ORDER — TRAMADOL HCL 50 MG PO TABS: 50.0000 mg | ORAL_TABLET | Freq: Four times a day (QID) | ORAL | 0 refills | Status: DC | PRN

## 2022-06-01 NOTE — Progress Notes (Signed)
Office Visit Note   Patient: Laura Dalton           Date of Birth: [birth year]           MRN: [id number] Visit Date: 06/01/2022              Requested by: Worthy Rancher, MD [place],  [place] [address] PCP: Dettinger, Fransisca Kaufmann, MD   Assessment & Plan: Visit Diagnoses:  1. Compression fracture of T12 vertebra, initial encounter ([place])   2. Compression fracture of L1 vertebra, initial encounter ([place])     Plan: Recheck 8 weeks.  Single x-ray image lateral thoracic on return to include L1.  Follow-Up Instructions: No follow-ups on file.   Orders:  Orders Placed This Encounter  Procedures   XR Lumbar Spine 2-3 Views   Meds ordered this encounter  Medications   traMADol (ULTRAM) 50 MG tablet    Sig: Take 1 tablet (50 mg total) by mouth every 6 (six) hours as needed.    Dispense:  28 tablet    Refill:  0    Acute compression fracture      Procedures: No procedures performed   Clinical Data: No additional findings.   Subjective: Chief Complaint  Patient presents with   Middle Back - Fracture, Follow-up   Lower Back - Fracture, Follow-up    HPI patient turns follow-up mild T12 and L1 compression.  She thinks she is a little bit better.  Her pharmacy only gave her 14 tramadol tablets.  Will switch her to 1 p.o. every 6 and hopefully they will give her a little bit larger supply.  She states when she leans forward she does not feel like she is going to die in pain.  Review of Systems updated unchanged   Objective: Vital Signs: Ht 5\' 6"  (1.676 m)   Wt 188 lb (85.3 kg)   BMI 30.34 kg/m   Physical Exam Constitutional:      Appearance: She is well-developed.  HENT:     Head: Normocephalic.     Right Ear: External ear normal.     Left Ear: External ear normal. There is no impacted cerumen.  Eyes:     Pupils: Pupils are equal, round, and reactive to light.  Neck:     Thyroid: No thyromegaly.     Trachea: No tracheal deviation.   Cardiovascular:     Rate and Rhythm: Normal rate.  Pulmonary:     Effort: Pulmonary effort is normal.  Abdominal:     Palpations: Abdomen is soft.  Musculoskeletal:     Cervical back: No rigidity.  Skin:    General: Skin is warm and dry.  Neurological:     Mental Status: She is alert and oriented to person, place, and time.  Psychiatric:        Behavior: Behavior normal.     Ortho Exam patient has decreased lumbar lordosis.  There is right lumbar curvature.  Mild tenderness thoracolumbar junction.  Specialty Comments:  No specialty comments available.  Imaging: No results found.   PMFS History: Patient Active Problem List   Diagnosis Date Noted   Osteopenia with high risk of fracture 05/18/2022   Thoracic compression fracture ([place]) 04/13/2022   Lumbar compression fracture (HCC) 04/13/2022   Memory changes 08/23/2021   Degeneration of spine 05/07/2017   Obesity (BMI 30.0-34.9) 03/12/2015   Hypothyroidism 07/29/2014   Osteoarthritis of right shoulder 01/21/2014   Leg length discrepancy 11/04/2013   Lumbar post-laminectomy  syndrome 11/04/2013   Diabetic peripheral neuropathy associated with type 2 diabetes mellitus ([place]) 11/04/2013   COPD (chronic obstructive pulmonary disease) ([place]) 10/01/2012   Chronic back pain 10/01/2012   Hypertension associated with diabetes ([place]) 10/01/2012   DM (diabetes mellitus) ([place]) 10/01/2012   Vitamin D deficiency 10/01/2012   Hyperlipidemia associated with type 2 diabetes mellitus ([place]) 10/01/2012   Dyslipidemia due to type 2 diabetes mellitus ([place]) 10/01/2012   Past Medical History:  Diagnosis Date   Bursitis of hip    Diabetes mellitus without complication ([place])    type II   Hyperlipidemia    Hypertension    Hypothyroidism    Low back pain    benign turmor back   Stroke ([place])    Vitamin D deficiency disease     Family History  Problem Relation Age of Onset   Diabetes Mother    Stroke Mother    Seizures Mother    Cancer  Father        prostate   COPD Sister    COPD Sister    COPD Sister    Cancer Brother        lung   Cancer Brother        prostate   COPD Brother    COPD Brother    Thyroid disease Son    Hashimoto's thyroiditis Son    Breast cancer Neg Hx     Past Surgical History:  Procedure Laterality Date   APPENDECTOMY     BACK SURGERY  1996   turmor on back    CHOLECYSTECTOMY     LEG SURGERY Right    "clot and had drain in leg  rt lower leg   Social History   Occupational History   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

## 2022-06-07 DIAGNOSIS — R5383 Other fatigue: Secondary | ICD-10-CM | POA: Diagnosis not present

## 2022-06-07 DIAGNOSIS — Z515 Encounter for palliative care: Secondary | ICD-10-CM | POA: Diagnosis not present

## 2022-06-07 DIAGNOSIS — M545 Low back pain, unspecified: Secondary | ICD-10-CM | POA: Diagnosis not present

## 2022-06-07 DIAGNOSIS — R296 Repeated falls: Secondary | ICD-10-CM | POA: Diagnosis not present

## 2022-06-07 DIAGNOSIS — E1142 Type 2 diabetes mellitus with diabetic polyneuropathy: Secondary | ICD-10-CM | POA: Diagnosis not present

## 2022-06-07 DIAGNOSIS — M6281 Muscle weakness (generalized): Secondary | ICD-10-CM | POA: Diagnosis not present

## 2022-06-07 DIAGNOSIS — J449 Chronic obstructive pulmonary disease, unspecified: Secondary | ICD-10-CM | POA: Diagnosis not present

## 2022-06-21 ENCOUNTER — Encounter: Payer: Self-pay | Admitting: Family Medicine

## 2022-06-21 ENCOUNTER — Ambulatory Visit (INDEPENDENT_AMBULATORY_CARE_PROVIDER_SITE_OTHER): Payer: Medicare Other | Admitting: Family Medicine

## 2022-06-21 VITALS — BP 139/68 | HR 100 | Ht 66.0 in | Wt 183.0 lb

## 2022-06-21 DIAGNOSIS — S22080A Wedge compression fracture of T11-T12 vertebra, initial encounter for closed fracture: Secondary | ICD-10-CM

## 2022-06-21 DIAGNOSIS — E1142 Type 2 diabetes mellitus with diabetic polyneuropathy: Secondary | ICD-10-CM | POA: Diagnosis not present

## 2022-06-21 NOTE — Progress Notes (Signed)
BP 139/68   Pulse 100   Ht 5\' 6"  (1.676 m)   Wt 183 lb (83 kg)   SpO2 94%   BMI 29.54 kg/m    Subjective:   Patient ID: Laura Dalton, female    DOB: 07/30/1950, 72 y.o.   MRN: [id number]  HPI: Laura Dalton is a 72 y.o. female presenting on 06/21/2022 for Back Pain (And nerve pain. Taking Cymbalta with some help. States that she takes 2 cymbalta qam)   HPI Back pain and nerve pain Patient is coming in today for follow-up for back pain and nerve pain..  She is currently taking amitriptyline and Cymbalta and it is helping.  She says it does not urine the pain completely but she does not think anything will but she does feel like it is improved and she has no side effects and she would like to continue forward with that.  Relevant past medical, surgical, family and social history reviewed and updated as indicated. Interim medical history since our last visit reviewed. Allergies and medications reviewed and updated.  Review of Systems  Constitutional:  Negative for chills and fever.  Eyes:  Negative for visual disturbance.  Respiratory:  Negative for chest tightness and shortness of breath.   Cardiovascular:  Negative for chest pain and leg swelling.  Genitourinary:  Negative for difficulty urinating and dysuria.  Musculoskeletal:  Positive for arthralgias, back pain and myalgias. Negative for gait problem.  Skin:  Negative for rash.  Neurological:  Negative for light-headedness and headaches.  Psychiatric/Behavioral:  Negative for agitation and behavioral problems.   All other systems reviewed and are negative.   Per HPI unless specifically indicated above   Allergies as of 06/21/2022       Reactions   Aspirin Hives   Gabapentin Other (See Comments)   Dizzy,falls        Medication List        Accurate as of June 21, 2022  3:25 PM. If you have any questions, ask your nurse or doctor.          albuterol 108 (90 Base) MCG/ACT inhaler Commonly known as:  VENTOLIN HFA 2 puffs every 6 hours as needed for wheezing or shortness of breath   amitriptyline 100 MG tablet Commonly known as: ELAVIL Take 1 tablet (100 mg total) by mouth at bedtime.   atorvastatin 40 MG tablet Commonly known as: LIPITOR Take 1 tablet (40 mg total) by mouth daily.   calcium carbonate 1500 (600 Ca) MG Tabs tablet Commonly known as: OSCAL Take by mouth 2 (two) times daily with a meal.   clopidogrel 75 MG tablet Commonly known as: PLAVIX Take 1 tablet (75 mg total) by mouth daily.   denosumab 60 MG/ML Sosy injection Commonly known as: PROLIA Inject 60 mg into the skin every 6 (six) months.   DULoxetine 30 MG capsule Commonly known as: Cymbalta Take 1 capsule (30 mg total) by mouth daily. What changed: how much to take   levothyroxine 125 MCG tablet Commonly known as: SYNTHROID Take 125 mcg by mouth daily before breakfast.   lisinopril 40 MG tablet Commonly known as: ZESTRIL Take 1 tablet (40 mg total) by mouth daily.   tiZANidine 4 MG tablet Commonly known as: Zanaflex Take 1 tablet (4 mg total) by mouth every 8 (eight) hours as needed for muscle spasms.   traMADol 50 MG tablet Commonly known as: ULTRAM Take 1 tablet (50 mg total) by mouth every 6 (six) hours as needed.  triamcinolone cream 0.1 % Commonly known as: KENALOG Apply 1 application topically 2 (two) times daily.         Objective:   BP 139/68   Pulse 100   Ht 5\' 6"  (1.676 m)   Wt 183 lb (83 kg)   SpO2 94%   BMI 29.54 kg/m   Wt Readings from Last 3 Encounters:  06/21/22 183 lb (83 kg)  06/01/22 188 lb (85.3 kg)  04/20/22 188 lb (85.3 kg)    Physical Exam Vitals and nursing note reviewed.  Constitutional:      General: She is not in acute distress.    Appearance: She is well-developed. She is not diaphoretic.  Eyes:     Conjunctiva/sclera: Conjunctivae normal.  Cardiovascular:     Rate and Rhythm: Normal rate and regular rhythm.     Heart sounds: Normal heart  sounds. No murmur heard. Pulmonary:     Effort: Pulmonary effort is normal. No respiratory distress.     Breath sounds: Normal breath sounds. No wheezing.  Musculoskeletal:        General: No tenderness. Normal range of motion.  Skin:    General: Skin is warm and dry.     Findings: No rash.  Neurological:     Mental Status: She is alert and oriented to person, place, and time.     Coordination: Coordination normal.  Psychiatric:        Behavior: Behavior normal.       Assessment & Plan:   Problem List Items Addressed This Visit       Endocrine   Diabetic peripheral neuropathy associated with type 2 diabetes mellitus (HCC) - Primary     Musculoskeletal and Integument   Thoracic compression fracture    Continue Cymbalta, seems to be doing well.  No changes there. Follow up plan: Return if symptoms worsen or fail to improve, for 1 to 2 months diabetes and thyroid recheck.  Counseling provided for all of the vaccine components No orders of the defined types were placed in this encounter.   Arville Care, MD [facility] 06/21/2022, 3:25 PM

## 2022-06-26 DIAGNOSIS — H353132 Nonexudative age-related macular degeneration, bilateral, intermediate dry stage: Secondary | ICD-10-CM | POA: Diagnosis not present

## 2022-06-28 ENCOUNTER — Encounter (INDEPENDENT_AMBULATORY_CARE_PROVIDER_SITE_OTHER): Payer: Medicare Other | Admitting: Ophthalmology

## 2022-06-28 DIAGNOSIS — I10 Essential (primary) hypertension: Secondary | ICD-10-CM | POA: Diagnosis not present

## 2022-06-28 DIAGNOSIS — H353132 Nonexudative age-related macular degeneration, bilateral, intermediate dry stage: Secondary | ICD-10-CM | POA: Diagnosis not present

## 2022-06-28 DIAGNOSIS — H43813 Vitreous degeneration, bilateral: Secondary | ICD-10-CM | POA: Diagnosis not present

## 2022-06-28 DIAGNOSIS — H35033 Hypertensive retinopathy, bilateral: Secondary | ICD-10-CM | POA: Diagnosis not present

## 2022-07-03 ENCOUNTER — Other Ambulatory Visit: Payer: Self-pay | Admitting: Family Medicine

## 2022-07-17 ENCOUNTER — Encounter: Payer: Self-pay | Admitting: Family Medicine

## 2022-07-17 ENCOUNTER — Ambulatory Visit (INDEPENDENT_AMBULATORY_CARE_PROVIDER_SITE_OTHER): Payer: Medicare Other | Admitting: Family Medicine

## 2022-07-17 VITALS — BP 150/75 | HR 95 | Ht 66.0 in | Wt 184.0 lb

## 2022-07-17 DIAGNOSIS — E1142 Type 2 diabetes mellitus with diabetic polyneuropathy: Secondary | ICD-10-CM

## 2022-07-17 DIAGNOSIS — E039 Hypothyroidism, unspecified: Secondary | ICD-10-CM | POA: Diagnosis not present

## 2022-07-17 DIAGNOSIS — E785 Hyperlipidemia, unspecified: Secondary | ICD-10-CM

## 2022-07-17 DIAGNOSIS — E1159 Type 2 diabetes mellitus with other circulatory complications: Secondary | ICD-10-CM

## 2022-07-17 DIAGNOSIS — Z23 Encounter for immunization: Secondary | ICD-10-CM

## 2022-07-17 DIAGNOSIS — I152 Hypertension secondary to endocrine disorders: Secondary | ICD-10-CM

## 2022-07-17 DIAGNOSIS — E1169 Type 2 diabetes mellitus with other specified complication: Secondary | ICD-10-CM

## 2022-07-17 LAB — BAYER DCA HB A1C WAIVED: HB A1C (BAYER DCA - WAIVED): 5.8 % — ABNORMAL HIGH (ref 4.8–5.6)

## 2022-07-17 MED ORDER — AMITRIPTYLINE HCL 100 MG PO TABS: 100.0000 mg | ORAL_TABLET | Freq: Every day | ORAL | 3 refills | Status: DC

## 2022-07-17 MED ORDER — ATORVASTATIN CALCIUM 40 MG PO TABS: 40.0000 mg | ORAL_TABLET | Freq: Every day | ORAL | 3 refills | Status: DC

## 2022-07-17 MED ORDER — CLOPIDOGREL BISULFATE 75 MG PO TABS: 75.0000 mg | ORAL_TABLET | Freq: Every day | ORAL | 3 refills | Status: DC

## 2022-07-17 MED ORDER — DULOXETINE HCL 60 MG PO CPEP: 60.0000 mg | ORAL_CAPSULE | Freq: Every day | ORAL | 3 refills | Status: DC

## 2022-07-17 NOTE — Progress Notes (Signed)
BP (!) 150/75   Pulse 95   Ht 5\' 6"  (1.676 m)   Wt 184 lb (83.5 kg)   SpO2 92%   BMI 29.70 kg/m    Subjective:   Patient ID: Laura Dalton, female    DOB: 05-22-1950, 72 y.o.   MRN: [id number]  HPI: Laura Dalton is a 72 y.o. female presenting on 07/17/2022 for Medical Management of Chronic Issues, Hypertension, and Diabetes   HPI Type 2 diabetes mellitus Patient comes in today for recheck of his diabetes. Patient has been currently taking no medicine currently, diet control. Patient is currently on an ACE inhibitor/ARB. Patient has not seen an ophthalmologist this year. Patient denies any new issues with their feet. The symptom started onset as an adult neuropathy and dyslipidemia and hypertension ARE RELATED TO DM.  Patient takes Cymbalta and amitriptyline for neuropathy and it does help some.  Hypertension Patient is currently on lisinopril, and their blood pressure today is 150/75 and repeat was 148/75. Patient denies any lightheadedness or dizziness. Patient denies headaches, blurred vision, chest pains, shortness of breath, or weakness. Denies any side effects from medication and is content with current medication.   Hyperlipidemia Patient is coming in for recheck of his hyperlipidemia. The patient is currently taking atorvastatin. They deny any issues with myalgias or history of liver damage from it. They deny any focal numbness or weakness or chest pain.   Hypothyroidism recheck Patient is coming in for thyroid recheck today as well. They deny any issues with hair changes or heat or cold problems or diarrhea or constipation. They deny any chest pain or palpitations. They are currently on levothyroxine 125 micrograms   Relevant past medical, surgical, family and social history reviewed and updated as indicated. Interim medical history since our last visit reviewed. Allergies and medications reviewed and updated.  Review of Systems  Constitutional:  Negative for chills  and fever.  HENT:  Negative for congestion, ear discharge and ear pain.   Eyes:  Negative for visual disturbance.  Respiratory:  Negative for chest tightness and shortness of breath.   Cardiovascular:  Negative for chest pain and leg swelling.  Genitourinary:  Negative for difficulty urinating and dysuria.  Musculoskeletal:  Positive for arthralgias, back pain and myalgias. Negative for gait problem.  Skin:  Negative for rash.  Neurological:  Negative for light-headedness and headaches.  Psychiatric/Behavioral:  Negative for agitation and behavioral problems.   All other systems reviewed and are negative.   Per HPI unless specifically indicated above   Allergies as of 07/17/2022       Reactions   Aspirin Hives   Gabapentin Other (See Comments)   Dizzy,falls        Medication List        Accurate as of Jul 17, 2022 12:23 PM. If you have any questions, ask your nurse or doctor.          albuterol 108 (90 Base) MCG/ACT inhaler Commonly known as: VENTOLIN HFA 2 puffs every 6 hours as needed for wheezing or shortness of breath   amitriptyline 100 MG tablet Commonly known as: ELAVIL Take 1 tablet (100 mg total) by mouth at bedtime.   atorvastatin 40 MG tablet Commonly known as: LIPITOR Take 1 tablet (40 mg total) by mouth daily.   calcium carbonate 1500 (600 Ca) MG Tabs tablet Commonly known as: OSCAL Take by mouth 2 (two) times daily with a meal.   clopidogrel 75 MG tablet Commonly known as: PLAVIX Take  1 tablet (75 mg total) by mouth daily.   denosumab 60 MG/ML Sosy injection Commonly known as: PROLIA Inject 60 mg into the skin every 6 (six) months.   DULoxetine 60 MG capsule Commonly known as: Cymbalta Take 1 capsule (60 mg total) by mouth daily. What changed:  medication strength how much to take Changed by: Nils Pyle, MD   levothyroxine 125 MCG tablet Commonly known as: SYNTHROID Take 125 mcg by mouth daily before breakfast.   lisinopril 40  MG tablet Commonly known as: ZESTRIL Take 1 tablet (40 mg total) by mouth daily.   tiZANidine 4 MG tablet Commonly known as: Zanaflex Take 1 tablet (4 mg total) by mouth every 8 (eight) hours as needed for muscle spasms.   traMADol 50 MG tablet Commonly known as: ULTRAM Take 1 tablet (50 mg total) by mouth every 6 (six) hours as needed.   triamcinolone cream 0.1 % Commonly known as: KENALOG Apply 1 application topically 2 (two) times daily.         Objective:   BP (!) 150/75   Pulse 95   Ht 5\' 6"  (1.676 m)   Wt 184 lb (83.5 kg)   SpO2 92%   BMI 29.70 kg/m   Wt Readings from Last 3 Encounters:  07/17/22 184 lb (83.5 kg)  06/21/22 183 lb (83 kg)  06/01/22 188 lb (85.3 kg)    Physical Exam Vitals and nursing note reviewed.  Constitutional:      General: She is not in acute distress.    Appearance: She is well-developed. She is not diaphoretic.  Eyes:     Conjunctiva/sclera: Conjunctivae normal.  Cardiovascular:     Rate and Rhythm: Normal rate and regular rhythm.     Heart sounds: Normal heart sounds. No murmur heard. Pulmonary:     Effort: Pulmonary effort is normal. No respiratory distress.     Breath sounds: Normal breath sounds. No wheezing.  Musculoskeletal:        General: No swelling or tenderness. Normal range of motion.  Skin:    General: Skin is warm and dry.     Findings: No rash.  Neurological:     Mental Status: She is alert and oriented to person, place, and time.     Coordination: Coordination normal.  Psychiatric:        Behavior: Behavior normal.       Assessment & Plan:   Problem List Items Addressed This Visit       Cardiovascular and Mediastinum   Hypertension associated with diabetes (HCC)   Relevant Medications   atorvastatin (LIPITOR) 40 MG tablet   Other Relevant Orders   CBC with Differential/Platelet   CMP14+EGFR   Lipid panel   Bayer DCA Hb A1c Waived   TSH     Endocrine   DM (diabetes mellitus) (HCC) - Primary    Relevant Medications   atorvastatin (LIPITOR) 40 MG tablet   Other Relevant Orders   CBC with Differential/Platelet   CMP14+EGFR   Lipid panel   Bayer DCA Hb A1c Waived   TSH   Hyperlipidemia associated with type 2 diabetes mellitus (HCC)   Relevant Medications   atorvastatin (LIPITOR) 40 MG tablet   Other Relevant Orders   CBC with Differential/Platelet   CMP14+EGFR   Lipid panel   Bayer DCA Hb A1c Waived   TSH   Diabetic peripheral neuropathy associated with type 2 diabetes mellitus (HCC)   Relevant Medications   DULoxetine (CYMBALTA) 60 MG capsule  atorvastatin (LIPITOR) 40 MG tablet   amitriptyline (ELAVIL) 100 MG tablet   Hypothyroidism   Relevant Orders   CBC with Differential/Platelet   CMP14+EGFR   Lipid panel   Bayer DCA Hb A1c Waived   TSH    A1c looks good at 5.8, blood pressure looks okay at 140s.  Allowing permissive hypertension.  No changes in medication. Follow up plan: Return in about 6 months (around 01/17/2023), or if symptoms worsen or fail to improve, for Diabetes hypertension and cholesterol.  Counseling provided for all of the vaccine components Orders Placed This Encounter  Procedures   CBC with Differential/Platelet   CMP14+EGFR   Lipid panel   Bayer DCA Hb A1c Waived   TSH    Arville Care, MD [facility] 07/17/2022, 12:23 PM

## 2022-07-18 LAB — CMP14+EGFR
ALT: 6 IU/L (ref 0–32)
AST: 17 IU/L (ref 0–40)
Albumin/Globulin Ratio: 1.8 (ref 1.2–2.2)
Albumin: 4.5 g/dL (ref 3.8–4.8)
Alkaline Phosphatase: 86 IU/L (ref 44–121)
BUN/Creatinine Ratio: 6 — ABNORMAL LOW (ref 12–28)
BUN: 6 mg/dL — ABNORMAL LOW (ref 8–27)
Bilirubin Total: 0.3 mg/dL (ref 0.0–1.2)
CO2: 24 mmol/L (ref 20–29)
Calcium: 10.4 mg/dL — ABNORMAL HIGH (ref 8.7–10.3)
Chloride: 99 mmol/L (ref 96–106)
Creatinine, Ser: 0.96 mg/dL (ref 0.57–1.00)
Globulin, Total: 2.5 g/dL (ref 1.5–4.5)
Glucose: 105 mg/dL — ABNORMAL HIGH (ref 70–99)
Potassium: 4.6 mmol/L (ref 3.5–5.2)
Sodium: 139 mmol/L (ref 134–144)
Total Protein: 7 g/dL (ref 6.0–8.5)
eGFR: 63 mL/min/{1.73_m2} (ref >59)

## 2022-07-18 LAB — LIPID PANEL
Chol/HDL Ratio: 2.3 ratio (ref 0.0–4.4)
Cholesterol, Total: 130 mg/dL (ref 100–199)
HDL: 56 mg/dL (ref >39)
LDL Chol Calc (NIH): 56 mg/dL (ref 0–99)
Triglycerides: 99 mg/dL (ref 0–149)
VLDL Cholesterol Cal: 18 mg/dL (ref 5–40)

## 2022-07-18 LAB — CBC WITH DIFFERENTIAL/PLATELET
Basophils Absolute: 0 10*3/uL (ref 0.0–0.2)
Basos: 0 %
EOS (ABSOLUTE): 0.4 10*3/uL (ref 0.0–0.4)
Eos: 5 %
Hematocrit: 39.8 % (ref 34.0–46.6)
Hemoglobin: 12.9 g/dL (ref 11.1–15.9)
Immature Grans (Abs): 0 10*3/uL (ref 0.0–0.1)
Immature Granulocytes: 0 %
Lymphocytes Absolute: 1.4 10*3/uL (ref 0.7–3.1)
Lymphs: 18 %
MCH: 27.2 pg (ref 26.6–33.0)
MCHC: 32.4 g/dL (ref 31.5–35.7)
MCV: 84 fL (ref 79–97)
Monocytes Absolute: 0.7 10*3/uL (ref 0.1–0.9)
Monocytes: 10 %
Neutrophils Absolute: 5.3 10*3/uL (ref 1.4–7.0)
Neutrophils: 67 %
Platelets: 257 10*3/uL (ref 150–450)
RBC: 4.74 x10E6/uL (ref 3.77–5.28)
RDW: 12.7 % (ref 11.7–15.4)
WBC: 7.8 10*3/uL (ref 3.4–10.8)

## 2022-07-18 LAB — TSH: TSH: 0.482 u[IU]/mL (ref 0.450–4.500)

## 2022-07-18 NOTE — Addendum Note (Signed)
Addended by: Dorene Sorrow on: 07/18/2022 11:10 AM   Modules accepted: Orders

## 2022-07-24 ENCOUNTER — Other Ambulatory Visit (HOSPITAL_COMMUNITY): Payer: Self-pay

## 2022-07-24 NOTE — Telephone Encounter (Signed)
Per [facility], patient must have a history of failure, contraindication, or intolerance to both an oral and IV bisphosphate for new to therapy. Patient can get Prolia filled at pharmacy using pharmacy benefits without PA. Copay is $300. After 1st injection, we can submit medical PA under continuation of therapy.

## 2022-07-27 ENCOUNTER — Other Ambulatory Visit: Payer: Self-pay | Admitting: Pharmacist

## 2022-07-27 DIAGNOSIS — S32010A Wedge compression fracture of first lumbar vertebra, initial encounter for closed fracture: Secondary | ICD-10-CM

## 2022-07-27 MED ORDER — ALENDRONATE SODIUM 70 MG PO TABS
70.0000 mg | ORAL_TABLET | ORAL | 11 refills | Status: DC
Start: 2022-07-27 — End: 2022-09-28

## 2022-07-27 NOTE — Progress Notes (Signed)
    07/27/2022 Name: Laura Dalton MRN: [id number] DOB: 07-24-1950   Per [employer], patient must have a history of failure, contraindication, or intolerance to both an oral and IV bisphosphate for new to therapy. Patient can get Prolia filled at pharmacy using pharmacy benefits without PA. Copay is $300. After 1st injection, we can submit medical PA under continuation of therapy.       Prolia is not affordable, therefore we will try alendronate PO generic (once weekly) to see if patient can tolerate and receive benefit. Counseled patient on medication and cost.  No history of bisphosphonate therapy and GFR 63.  No contraindication to bisphosphonates upon chart review and patient report.   Kieth Brightly, PharmD, BCACP Clinical Pharmacist, [facility]

## 2022-08-03 ENCOUNTER — Ambulatory Visit (INDEPENDENT_AMBULATORY_CARE_PROVIDER_SITE_OTHER): Payer: Medicare Other | Admitting: Orthopaedic Surgery

## 2022-08-03 ENCOUNTER — Other Ambulatory Visit (INDEPENDENT_AMBULATORY_CARE_PROVIDER_SITE_OTHER): Payer: Medicare Other

## 2022-08-03 ENCOUNTER — Encounter: Payer: Self-pay | Admitting: Orthopaedic Surgery

## 2022-08-03 VITALS — Ht 66.0 in | Wt 185.0 lb

## 2022-08-03 DIAGNOSIS — S22080A Wedge compression fracture of T11-T12 vertebra, initial encounter for closed fracture: Secondary | ICD-10-CM

## 2022-08-03 DIAGNOSIS — S32010A Wedge compression fracture of first lumbar vertebra, initial encounter for closed fracture: Secondary | ICD-10-CM

## 2022-08-03 NOTE — Progress Notes (Signed)
Office Visit Note   Patient: Laura Dalton           Date of Birth: [birth year]           MRN: [id number] Visit Date: 08/03/2022              Requested by: Nils Pyle, MD [address] [place],  [place] [address] PCP: Dettinger, Elige Radon, MD   Assessment & Plan: Visit Diagnoses:  1. Compression fracture of T12 vertebra, initial encounter (HCC)   2. Compression fracture of L1 vertebra, initial encounter [facility])     Plan: She needs to use her 4-prong walker for fall prevention.  She can follow-up as needed.  Discussed with her she should not be using a cane due to her multiple falls with a cane.  Follow-Up Instructions: No follow-ups on file.   Orders:  Orders Placed This Encounter  Procedures   XR Thoracic Spine 2 View   No orders of the defined types were placed in this encounter.     Procedures: No procedures performed   Clinical Data: No additional findings.   Subjective: Chief Complaint  Patient presents with   Middle Back - Fracture, Follow-up    HPI 72 year old female returns she states she is feeling a little bit better.  X-rays there is concern about L1 compression.  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  Original surgery was in 1996.  Patient uses a cane history of multiple falls she has a rolling walker 12 feet with rolling walker she does have a folding walker but does not like to use it.  She states she has had probably 20 falls over the last 3 years.  Past history of pelvic fractures type 2 diabetes neuropathy, myelopathy.  Old history of CVA.  Review of Systems updated unchanged.   Objective: Vital Signs: Ht 5\' 6"  (1.676 m)   Wt 185 lb (83.9 kg)   BMI 29.86 kg/m   Physical Exam Constitutional:      Appearance: She is well-developed.  HENT:     Head: Normocephalic.     Right Ear: External ear  normal.     Left Ear: External ear normal. There is no impacted cerumen.  Eyes:     Pupils: Pupils are equal, round, and reactive to light.  Neck:     Thyroid: No thyromegaly.     Trachea: No tracheal deviation.  Cardiovascular:     Rate and Rhythm: Normal rate.  Pulmonary:     Effort: Pulmonary effort is normal.  Abdominal:     Palpations: Abdomen is soft.  Musculoskeletal:     Cervical back: No rigidity.  Skin:    General: Skin is warm and dry.  Neurological:     Mental Status: She is alert and oriented to person, place, and time.  Psychiatric:        Behavior: Behavior normal.     Ortho Exam lumbar curvature no tenderness in the lower thoracic region.  Slow short stride gait with thoracic kyphosis and the single-point cane.  Specialty Comments:  No specialty comments available.  Imaging: XR Thoracic Spine 2 View  Result Date: 08/03/2022 AP lateral thoracic images unchanged.  Multiple spurs mid thoracic spine anteriorly disc space narrowing generalized kyphosis.  No further compression was noted. Impression: T12-L1 compression, stable.    PMFS History: Patient Active Problem List  Diagnosis Date Noted   Osteopenia with high risk of fracture 05/18/2022   Thoracic compression fracture (HCC) 04/13/2022   Lumbar compression fracture (HCC) 04/13/2022   Memory changes 08/23/2021   Degeneration of spine 05/07/2017   Obesity (BMI 30.0-34.9) 03/12/2015   Hypothyroidism 07/29/2014   Osteoarthritis of right shoulder 01/21/2014   Leg length discrepancy 11/04/2013   Lumbar post-laminectomy syndrome 11/04/2013   Diabetic peripheral neuropathy associated with type 2 diabetes mellitus (HCC) 11/04/2013   COPD (chronic obstructive pulmonary disease) (HCC) 10/01/2012   Chronic back pain 10/01/2012   Hypertension associated with diabetes (HCC) 10/01/2012   DM (diabetes mellitus) (HCC) 10/01/2012   Vitamin D deficiency 10/01/2012   Hyperlipidemia associated with type 2 diabetes  mellitus (HCC) 10/01/2012   Dyslipidemia due to type 2 diabetes mellitus (HCC) 10/01/2012   Past Medical History:  Diagnosis Date   Bursitis of hip    Diabetes mellitus without complication (HCC)    type II   Hyperlipidemia    Hypertension    Hypothyroidism    Low back pain    benign turmor back   Stroke (HCC)    Vitamin D deficiency disease     Family History  Problem Relation Age of Onset   Diabetes Mother    Stroke Mother    Seizures Mother    Cancer Father        prostate   COPD Sister    COPD Sister    COPD Sister    Cancer Brother        lung   Cancer Brother        prostate   COPD Brother    COPD Brother    Thyroid disease Son    Hashimoto's thyroiditis Son    Breast cancer Neg Hx     Past Surgical History:  Procedure Laterality Date   APPENDECTOMY     BACK SURGERY  1996   turmor on back    CHOLECYSTECTOMY     LEG SURGERY Right    "clot and had drain in leg  rt lower leg   Social History   Occupational History   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

## 2022-08-07 ENCOUNTER — Other Ambulatory Visit: Payer: Self-pay | Admitting: Orthopaedic Surgery

## 2022-08-08 ENCOUNTER — Other Ambulatory Visit: Payer: Self-pay | Admitting: Orthopaedic Surgery

## 2022-08-08 MED ORDER — TRAMADOL HCL 50 MG PO TABS: 50.0000 mg | ORAL_TABLET | Freq: Four times a day (QID) | ORAL | 0 refills | Status: DC | PRN

## 2022-08-08 NOTE — Telephone Encounter (Signed)
Last refill

## 2022-08-12 ENCOUNTER — Encounter (HOSPITAL_COMMUNITY): Payer: Self-pay

## 2022-08-12 ENCOUNTER — Emergency Department (HOSPITAL_COMMUNITY)
Admission: EM | Admit: 2022-08-12 | Discharge: 2022-08-12 | Disposition: A | Discharge status: Home / Self Care | Payer: Medicare Other | Attending: Emergency Medicine | Admitting: Emergency Medicine

## 2022-08-12 ENCOUNTER — Emergency Department (HOSPITAL_COMMUNITY): Payer: Medicare Other

## 2022-08-12 ENCOUNTER — Other Ambulatory Visit: Payer: Self-pay

## 2022-08-12 DIAGNOSIS — S0990XA Unspecified injury of head, initial encounter: Secondary | ICD-10-CM | POA: Diagnosis not present

## 2022-08-12 DIAGNOSIS — W19XXXA Unspecified fall, initial encounter: Secondary | ICD-10-CM

## 2022-08-12 DIAGNOSIS — S51812A Laceration without foreign body of left forearm, initial encounter: Secondary | ICD-10-CM | POA: Insufficient documentation

## 2022-08-12 DIAGNOSIS — Z79899 Other long term (current) drug therapy: Secondary | ICD-10-CM | POA: Diagnosis not present

## 2022-08-12 DIAGNOSIS — R296 Repeated falls: Secondary | ICD-10-CM | POA: Diagnosis not present

## 2022-08-12 DIAGNOSIS — Y900 Blood alcohol level of less than 20 mg/100 ml: Secondary | ICD-10-CM | POA: Diagnosis not present

## 2022-08-12 DIAGNOSIS — S0093XA Contusion of unspecified part of head, initial encounter: Secondary | ICD-10-CM | POA: Insufficient documentation

## 2022-08-12 DIAGNOSIS — S22080A Wedge compression fracture of T11-T12 vertebra, initial encounter for closed fracture: Secondary | ICD-10-CM | POA: Diagnosis not present

## 2022-08-12 DIAGNOSIS — M25522 Pain in left elbow: Secondary | ICD-10-CM | POA: Diagnosis not present

## 2022-08-12 DIAGNOSIS — Z7902 Long term (current) use of antithrombotics/antiplatelets: Secondary | ICD-10-CM | POA: Diagnosis not present

## 2022-08-12 DIAGNOSIS — S62307A Unspecified fracture of fifth metacarpal bone, left hand, initial encounter for closed fracture: Secondary | ICD-10-CM | POA: Insufficient documentation

## 2022-08-12 DIAGNOSIS — W01198A Fall on same level from slipping, tripping and stumbling with subsequent striking against other object, initial encounter: Secondary | ICD-10-CM | POA: Diagnosis not present

## 2022-08-12 DIAGNOSIS — I10 Essential (primary) hypertension: Secondary | ICD-10-CM | POA: Diagnosis not present

## 2022-08-12 DIAGNOSIS — S22000A Wedge compression fracture of unspecified thoracic vertebra, initial encounter for closed fracture: Secondary | ICD-10-CM

## 2022-08-12 DIAGNOSIS — S60222A Contusion of left hand, initial encounter: Secondary | ICD-10-CM | POA: Diagnosis present

## 2022-08-12 DIAGNOSIS — S59902A Unspecified injury of left elbow, initial encounter: Secondary | ICD-10-CM | POA: Diagnosis not present

## 2022-08-12 DIAGNOSIS — M546 Pain in thoracic spine: Secondary | ICD-10-CM | POA: Diagnosis not present

## 2022-08-12 DIAGNOSIS — S62308A Unspecified fracture of other metacarpal bone, initial encounter for closed fracture: Secondary | ICD-10-CM

## 2022-08-12 DIAGNOSIS — M47812 Spondylosis without myelopathy or radiculopathy, cervical region: Secondary | ICD-10-CM | POA: Diagnosis not present

## 2022-08-12 DIAGNOSIS — S62317A Displaced fracture of base of fifth metacarpal bone. left hand, initial encounter for closed fracture: Secondary | ICD-10-CM | POA: Diagnosis not present

## 2022-08-12 LAB — COMPREHENSIVE METABOLIC PANEL
ALT: 9 U/L (ref 0–44)
AST: 19 U/L (ref 15–41)
Albumin: 3.9 g/dL (ref 3.5–5.0)
Alkaline Phosphatase: 71 U/L (ref 38–126)
Anion gap: 9 (ref 5–15)
BUN: 5 mg/dL — ABNORMAL LOW (ref 8–23)
CO2: 27 mmol/L (ref 22–32)
Calcium: 9.5 mg/dL (ref 8.9–10.3)
Chloride: 99 mmol/L (ref 98–111)
Creatinine, Ser: 0.94 mg/dL (ref 0.44–1.00)
GFR, Estimated: >60 mL/min (ref >60)
Glucose, Bld: 111 mg/dL — ABNORMAL HIGH (ref 70–99)
Potassium: 4.2 mmol/L (ref 3.5–5.1)
Sodium: 135 mmol/L (ref 135–145)
Total Bilirubin: 0.7 mg/dL (ref 0.3–1.2)
Total Protein: 7 g/dL (ref 6.5–8.1)

## 2022-08-12 LAB — I-STAT CHEM 8, ED
BUN: 5 mg/dL — ABNORMAL LOW (ref 8–23)
Calcium, Ion: 1.21 mmol/L (ref 1.15–1.40)
Chloride: 99 mmol/L (ref 98–111)
Creatinine, Ser: 0.8 mg/dL (ref 0.44–1.00)
Glucose, Bld: 109 mg/dL — ABNORMAL HIGH (ref 70–99)
HCT: 38 % (ref 36.0–46.0)
Hemoglobin: 12.9 g/dL (ref 12.0–15.0)
Potassium: 4.1 mmol/L (ref 3.5–5.1)
Sodium: 136 mmol/L (ref 135–145)
TCO2: 27 mmol/L (ref 22–32)

## 2022-08-12 LAB — PROTIME-INR
INR: 1 (ref 0.8–1.2)
Prothrombin Time: 13.5 seconds (ref 11.4–15.2)

## 2022-08-12 LAB — CBC
HCT: 39.2 % (ref 36.0–46.0)
Hemoglobin: 12.5 g/dL (ref 12.0–15.0)
MCH: 27.5 pg (ref 26.0–34.0)
MCHC: 31.9 g/dL (ref 30.0–36.0)
MCV: 86.2 fL (ref 80.0–100.0)
Platelets: 278 10*3/uL (ref 150–400)
RBC: 4.55 MIL/uL (ref 3.87–5.11)
RDW: 12.7 % (ref 11.5–15.5)
WBC: 9.5 10*3/uL (ref 4.0–10.5)
nRBC: 0 % (ref 0.0–0.2)

## 2022-08-12 LAB — LACTIC ACID, PLASMA: Lactic Acid, Venous: 1.6 mmol/L (ref 0.5–1.9)

## 2022-08-12 LAB — ETHANOL: Alcohol, Ethyl (B): <10 mg/dL (ref <10)

## 2022-08-12 NOTE — ED Triage Notes (Signed)
Pt came to ED for a fall that happened yesterday. Pt is on Plavix. Pt fell backwards on concrete porch and hit back of head. Pt had hematoma on back of head. Axox4. Hx of stroke.

## 2022-08-12 NOTE — Discharge Instructions (Signed)
X-ray shows he might of broken a bone in the middle of your thoracic back.  Follow-up with Dr. Ophelia Charter for this.  As for the hand fracture, you are being referred to orthopedics.  If you develop any other new or concerning symptoms and return to the ER or call 911.

## 2022-08-12 NOTE — ED Notes (Addendum)
Wound to left inside forearm cleaned with warm water/soap and washcloth. Covered with 4x4 and medipore tape

## 2022-08-12 NOTE — Progress Notes (Signed)
Orthopedic Tech Progress Note Patient Details:  Laura Dalton 04-20-1950 [id number]  Ortho Devices Type of Ortho Device: Ulna gutter splint Ortho Device/Splint Location: LUE Ortho Device/Splint Interventions: Application   Post Interventions Patient Tolerated: Well  Genelle Bal Younes Degeorge 08/12/2022, 3:40 PM

## 2022-08-12 NOTE — ED Provider Notes (Signed)
[place] EMERGENCY DEPARTMENT AT [facility] Provider Note   CSN: [id number] Arrival date & time: 08/12/22  1145     History  Chief Complaint  Patient presents with   Marvell Fuller Piedad Hansman is a 72 y.o. female.  HPI 72 year old female who is on Plavix presents after a fall yesterday.  She was on the porch and her legs seem to give out from under her and she fell.  Has bruising to her left hand, a scrape to her forearm, and swelling to her left elbow.  She also hit the back of her head and has an occipital headache/neck pain.  Is having some back pain.  It took her multiple hours to crawl to get back to where she could stand up.  She does not think she passed out.  Falls and difficulty with leg weakness have been ongoing for 2 years resulting in multiple falls.  No vomiting but she is having some photophobia. No recent illness.   Home Medications Prior to Admission medications   Medication Sig Start Date End Date Taking? Authorizing Provider  albuterol (VENTOLIN HFA) 108 (90 Base) MCG/ACT inhaler 2 puffs every 6 hours as needed for wheezing or shortness of breath 05/11/22   Dettinger, Elige Radon, MD  alendronate (FOSAMAX) 70 MG tablet Take 1 tablet (70 mg total) by mouth every 7 (seven) days. Take with a full glass of water on an empty stomach. 07/27/22   Dettinger, Elige Radon, MD  amitriptyline (ELAVIL) 100 MG tablet Take 1 tablet (100 mg total) by mouth at bedtime. 07/17/22   Dettinger, Elige Radon, MD  atorvastatin (LIPITOR) 40 MG tablet Take 1 tablet (40 mg total) by mouth daily. 07/17/22   Dettinger, Elige Radon, MD  calcium carbonate (OSCAL) 1500 (600 Ca) MG TABS tablet Take by mouth 2 (two) times daily with a meal.    [provider]  clopidogrel (PLAVIX) 75 MG tablet Take 1 tablet (75 mg total) by mouth daily. 07/17/22   Dettinger, Elige Radon, MD  DULoxetine (CYMBALTA) 60 MG capsule Take 1 capsule (60 mg total) by mouth daily. 07/17/22   Dettinger, Elige Radon, MD  levothyroxine  (SYNTHROID) 125 MCG tablet Take 125 mcg by mouth daily before breakfast.    [provider]  lisinopril (ZESTRIL) 40 MG tablet Take 1 tablet (40 mg total) by mouth daily. 04/21/22   Antoine Poche, MD  tiZANidine (ZANAFLEX) 4 MG tablet Take 1 tablet (4 mg total) by mouth every 8 (eight) hours as needed for muscle spasms. 04/14/21   Dettinger, Elige Radon, MD  traMADol (ULTRAM) 50 MG tablet Take 1 tablet (50 mg total) by mouth every 6 (six) hours as needed. 08/08/22   Eldred Manges, MD  triamcinolone cream (KENALOG) 0.1 % Apply 1 application topically 2 (two) times daily. 08/01/18   Dettinger, Elige Radon, MD      Allergies    Aspirin and Gabapentin    Review of Systems   Review of Systems  Respiratory:  Negative for shortness of breath.   Cardiovascular:  Negative for chest pain.  Gastrointestinal:  Negative for vomiting.  Musculoskeletal:  Positive for arthralgias, back pain, joint swelling and neck pain.  Neurological:  Positive for weakness (chronic, in legs) and headaches. Negative for numbness.    Physical Exam Updated Vital Signs BP 113/67   Pulse 74   Temp 98.8 F (37.1 C) (Oral)   Resp 18   Ht 5\' 6"  (1.676 m)   Wt  81.6 kg   SpO2 98%   BMI 29.05 kg/m  Physical Exam Vitals and nursing note reviewed.  Constitutional:      Appearance: She is well-developed.  HENT:     Head: Normocephalic. Contusion present.     Comments: Diffuse occipital tenderness, mild swelling. Eyes:     Extraocular Movements: Extraocular movements intact.     Pupils: Pupils are equal, round, and reactive to light.  Cardiovascular:     Rate and Rhythm: Normal rate and regular rhythm.     Pulses:          Radial pulses are 2+ on the left side.       Dorsalis pedis pulses are 2+ on the right side and 2+ on the left side.     Heart sounds: Normal heart sounds.  Pulmonary:     Effort: Pulmonary effort is normal.     Breath sounds: Normal breath sounds.  Abdominal:     General: There is no  distension.     Tenderness: There is no abdominal tenderness.  Musculoskeletal:     Left shoulder: Normal range of motion.     Left upper arm: No tenderness or bony tenderness.     Left elbow: Swelling present. No deformity. Normal range of motion. Tenderness (mild) present.     Left forearm: Laceration (superficial abrasions) present. No swelling, tenderness or bony tenderness.     Left wrist: No tenderness. Normal range of motion.     Left hand: Swelling (ecchymosis and swelling to dorsal/ulnar hand) and tenderness (mild) present. Normal range of motion.       Arms:     Cervical back: Muscular tenderness present.     Thoracic back: Tenderness present.     Lumbar back: No tenderness.     Right hip: No tenderness. Normal range of motion.     Left hip: No tenderness. Normal range of motion.  Skin:    General: Skin is warm and dry.  Neurological:     Mental Status: She is alert.     Comments: CN 3-12 grossly intact. 5/5 strength in all 4 extremities. Grossly normal sensation. Normal finger to nose.      ED Results / Procedures / Treatments   Labs (all labs ordered are listed, but only abnormal results are displayed) Labs Reviewed  COMPREHENSIVE METABOLIC PANEL - Abnormal; Notable for the following components:      Result Value   Glucose, Bld 111 (*)    BUN 5 (*)    All other components within normal limits  I-STAT CHEM 8, ED - Abnormal; Notable for the following components:   BUN 5 (*)    Glucose, Bld 109 (*)    All other components within normal limits  CBC  ETHANOL  LACTIC ACID, PLASMA  PROTIME-INR  URINALYSIS, ROUTINE W REFLEX MICROSCOPIC    EKG EKG Interpretation  Date/Time:  Saturday August 12 2022 12:12:13 EDT Ventricular Rate:  71 PR Interval:  206 QRS Duration: 82 QT Interval:  412 QTC Calculation: 447 R Axis:   -4 Text Interpretation: Normal sinus rhythm with sinus arrhythmia Low voltage QRS Cannot rule out Anterior infarct , age undetermined Poor data  quality in current ECG precludes serial comparison Confirmed by Pricilla Loveless [phone]) on 08/12/2022 2:36:03 PM  Radiology DG Thoracic Spine W/Swimmers  Result Date: 08/12/2022 CLINICAL DATA:  Fall.  Upper back pain. EXAM: THORACIC SPINE - 3 VIEWS COMPARISON:  Thoracic spine radiographs, 04/05/2022. FINDINGS: Slight depression of the upper endplate of  a midthoracic vertebra not convincingly present on the prior radiographs. No other evidence of a fracture. No bone lesion. No spondylolisthesis. Mild disc degenerative changes throughout the thoracic spine with anterior endplate osteophytes stable from the prior exam. Soft tissues are unremarkable. IMPRESSION: 1. Mild depression of the upper endplate of a midthoracic vertebra that appears new since the prior thoracic spine radiographs and may reflect a recent fracture. This could be further assessed, if desired clinically, with thoracic spine MRI. 2. No other evidence of a fracture and no other change from the prior radiographs. Electronically Signed   By: Amie Portland M.D.   On: 08/12/2022 14:20   DG Hand Complete Left  Result Date: 08/12/2022 CLINICAL DATA:  Fall.  Left hand injury and pain. EXAM: LEFT HAND - COMPLETE 3+ VIEW COMPARISON:  None Available. FINDINGS: Mildly displaced oblique fracture of the distal 5th metacarpal is seen. Mild volar angulation of the distal fracture fragment is seen. No other fractures identified. No evidence of dislocation IMPRESSION: Mildly displaced oblique fracture of the distal 5th metacarpal. Electronically Signed   By: Danae Orleans M.D.   On: 08/12/2022 14:15   DG Elbow Complete Left  Result Date: 08/12/2022 CLINICAL DATA:  Fall.  Left elbow injury and pain. EXAM: LEFT ELBOW - COMPLETE 3+ VIEW COMPARISON:  None Available. FINDINGS: There is no evidence of fracture, dislocation, or joint effusion. There is no evidence of arthropathy or other focal bone abnormality. Soft tissues are unremarkable. IMPRESSION: Negative.  Electronically Signed   By: Danae Orleans M.D.   On: 08/12/2022 14:14   CT Cervical Spine Wo Contrast  Result Date: 08/12/2022 CLINICAL DATA:  Trauma EXAM: CT CERVICAL SPINE WITHOUT CONTRAST TECHNIQUE: Multidetector CT imaging of the cervical spine was performed without intravenous contrast. Multiplanar CT image reconstructions were also generated. RADIATION DOSE REDUCTION: This exam was performed according to the departmental dose-optimization program which includes automated exposure control, adjustment of the mA and/or kV according to patient size and/or use of iterative reconstruction technique. COMPARISON:  None Available. FINDINGS: Alignment: Alignment of posterior margins of vertebral bodies appears normal. Skull base and vertebrae: No recent fracture is seen. Soft tissues and spinal canal: There is extrinsic pressure over the ventral margin of thecal sac caused by posterior bony spurs at multiple levels. There is spinal stenosis at C5-C6 and C6-C7 levels. Disc levels: There is encroachment of neural foramina by bony spurs from C3-C7 levels. Upper chest: Small linear densities in the upper lung fields may suggest scarring. Other: None. IMPRESSION: No recent fracture is seen in cervical spine. Cervical spondylosis with spinal stenosis and encroachment of neural foramina at multiple levels. Electronically Signed   By: Ernie Avena M.D.   On: 08/12/2022 14:13   CT Head Wo Contrast  Result Date: 08/12/2022 CLINICAL DATA:  Trauma EXAM: CT HEAD WITHOUT CONTRAST TECHNIQUE: Contiguous axial images were obtained from the base of the skull through the vertex without intravenous contrast. RADIATION DOSE REDUCTION: This exam was performed according to the departmental dose-optimization program which includes automated exposure control, adjustment of the mA and/or kV according to patient size and/or use of iterative reconstruction technique. COMPARISON:  06/20/2018 FINDINGS: Brain: No acute intracranial  findings are seen. There are no signs of bleeding within the cranium. There is no focal edema or mass effect Cortical sulci are prominent. There is decreased density in periventricular and subcortical white matter. Vascular: Scattered arterial calcifications are seen. Skull: No acute findings are seen. Sinuses/Orbits: Unremarkable. Other: There is increased amount of  CSF in the sella suggesting partial empty sella. IMPRESSION: No acute intracranial findings are seen. Atrophy. Small vessel disease. Electronically Signed   By: Ernie Avena M.D.   On: 08/12/2022 14:05    Procedures Procedures    Medications Ordered in ED Medications - No data to display  ED Course/ Medical Decision Making/ A&P                             Medical Decision Making Amount and/or Complexity of Data Reviewed Labs: ordered.    Details: Unremarkable kidney function.  Normal WBC and hemoglobin. Radiology: ordered and independent interpretation performed.    Details: No head bleed or C-spine fracture.  Does have 1/5 metacarpal fracture. ECG/medicine tests: ordered and independent interpretation performed.    Details: No obvious acute ischemia.   Patient has a known T12 compression fracture but now has a mid thoracic potential fracture.  This is where she was tender.  However it is pretty minimal and she has no acute neurocomplaints.  She has been having falling and difficulty with balance for quite some time.  I do not think an emergent workup is needed otherwise.  Labs were obtained in triage though really she has not been sick or ill to suggest that she would have a significant electrolyte or hematologic abnormality.  She does have 1/5 metacarpal fracture and I did discuss with Dr. Yehuda Budd, can call the office next week for follow-up.  Will put in an ulnar gutter.  Appears stable for discharge.  She declines pain control here.          Final Clinical Impression(s) / ED Diagnoses Final diagnoses:  Fall,  initial encounter  Closed fracture of fifth metacarpal bone, unspecified fracture morphology, initial encounter  Compression fracture of thoracic vertebra, unspecified thoracic vertebral level, initial encounter [facility])    Rx / DC Orders ED Discharge Orders     None         Pricilla Loveless, MD 08/12/22 1558

## 2022-08-14 ENCOUNTER — Encounter: Payer: Self-pay | Admitting: Family Medicine

## 2022-08-16 ENCOUNTER — Encounter: Payer: Self-pay | Admitting: Orthopedic Surgery

## 2022-08-16 ENCOUNTER — Telehealth: Payer: Self-pay

## 2022-08-16 ENCOUNTER — Ambulatory Visit (INDEPENDENT_AMBULATORY_CARE_PROVIDER_SITE_OTHER): Payer: Medicare Other | Admitting: Orthopedic Surgery

## 2022-08-16 VITALS — BP 130/79 | HR 95 | Ht 66.0 in | Wt 180.0 lb

## 2022-08-16 DIAGNOSIS — S62357A Nondisplaced fracture of shaft of fifth metacarpal bone, left hand, initial encounter for closed fracture: Secondary | ICD-10-CM

## 2022-08-16 NOTE — Progress Notes (Addendum)
New Patient Visit  Assessment: Laura Dalton is a 72 y.o. female with the following: 1. Closed nondisplaced fracture of shaft of fifth metacarpal bone of left hand, initial encounter  Plan: Laura Dalton fell and sustained a minimally displaced fracture of the left fifth metacarpal.  This is amenable to nonoperative management.  She is right-hand dominant.  She was placed in an ulnar gutter cast in clinic today.  She will continue with medications as needed.  She is to elevate to help with swelling.  I will see her back in 2-3 weeks for repeat evaluation.  Cast application - Left short arm cast -ulnar gutter   Verbal consent was obtained and the correct extremity was identified. The small and ring fingers were buddy taped together A well padded, appropriately molded short arm cast was applied to the Left arm Fingers remained warm and well perfused.   There were no sharp edges Patient tolerated the procedure well Cast care instructions were provided    Follow-up: Return in about 20 days (around 09/05/2022).  Subjective:  Chief Complaint  Patient presents with   Hand Injury    08/11/22 left hand     History of Present Illness: Laura Dalton is a 72 y.o. female who presents for evaluation of left hand pain.  Approximately 5 days ago, she slipped and fell after her legs gave out.  She braced herself.  She had immediate pain in the left hand.  She presented to the emergency department the next day.  She was noted to have a fracture of the fifth metacarpal.  She has been taking Tylenol, as well as tramadol for pain.  She also has a skin tear on her forearm.  Her ring finger was mobile, and causing some irritation against the edge of the splint material.  No numbness or tingling.  She hit her head when she fell and was diagnosed with a concussion in the ED.    Review of Systems: No fevers or chills No numbness or tingling No chest pain No shortness of breath No bowel or  bladder dysfunction No GI distress No headaches   Medical History:  Past Medical History:  Diagnosis Date   Bursitis of hip    Diabetes mellitus without complication (HCC)    type II   Hyperlipidemia    Hypertension    Hypothyroidism    Low back pain    benign turmor back   Stroke (HCC)    Vitamin D deficiency disease     Past Surgical History:  Procedure Laterality Date   APPENDECTOMY     BACK SURGERY  1996   turmor on back    CHOLECYSTECTOMY     LEG SURGERY Right    "clot and had drain in leg  rt lower leg    Family History  Problem Relation Age of Onset   Diabetes Mother    Stroke Mother    Seizures Mother    Cancer Father        prostate   COPD Sister    COPD Sister    COPD Sister    Cancer Brother        lung   Cancer Brother        prostate   COPD Brother    COPD Brother    Thyroid disease Son    Hashimoto's thyroiditis Son    Breast cancer Neg Hx    Social History   Tobacco Use   Smoking status: Former  Packs/day: 0.50    Years: 45.00    Additional pack years: 0.00    Total pack years: 22.50    Types: Cigarettes    Start date: 08/15/1962    Quit date: 12/01/2007    Years since quitting: 14.7   Smokeless tobacco: Never  Vaping Use   Vaping Use: Never used  Substance Use Topics   Alcohol use: No    Alcohol/week: 0.0 standard drinks of alcohol   Drug use: No    Allergies  Allergen Reactions   Aspirin Hives   Gabapentin Other (See Comments)    Dizzy,falls    Current Meds  Medication Sig   albuterol (VENTOLIN HFA) 108 (90 Base) MCG/ACT inhaler 2 puffs every 6 hours as needed for wheezing or shortness of breath   alendronate (FOSAMAX) 70 MG tablet Take 1 tablet (70 mg total) by mouth every 7 (seven) days. Take with a full glass of water on an empty stomach.   amitriptyline (ELAVIL) 100 MG tablet Take 1 tablet (100 mg total) by mouth at bedtime.   atorvastatin (LIPITOR) 40 MG tablet Take 1 tablet (40 mg total) by mouth daily.    calcium carbonate (OSCAL) 1500 (600 Ca) MG TABS tablet Take by mouth 2 (two) times daily with a meal.   clopidogrel (PLAVIX) 75 MG tablet Take 1 tablet (75 mg total) by mouth daily.   DULoxetine (CYMBALTA) 60 MG capsule Take 1 capsule (60 mg total) by mouth daily.   levothyroxine (SYNTHROID) 125 MCG tablet Take 125 mcg by mouth daily before breakfast.   lisinopril (ZESTRIL) 40 MG tablet Take 1 tablet (40 mg total) by mouth daily.   tiZANidine (ZANAFLEX) 4 MG tablet Take 1 tablet (4 mg total) by mouth every 8 (eight) hours as needed for muscle spasms.   traMADol (ULTRAM) 50 MG tablet Take 1 tablet (50 mg total) by mouth every 6 (six) hours as needed.   triamcinolone cream (KENALOG) 0.1 % Apply 1 application topically 2 (two) times daily.    Objective: BP 130/79   Pulse 95   Ht 5\' 6"  (1.676 m)   Wt 180 lb (81.6 kg)   BMI 29.05 kg/m   Physical Exam:  General: Elderly female., Alert and oriented., and No acute distress.  Evaluation left hand demonstrates bruising and swelling on the ulnar hand.  Fingers warm well-perfused.  She has a skin tear on the volar aspect of the forearm.  Mild stiffness appreciated.  She is tenderness to palpation along the metacarpal.  Fingers are warm and well-perfused.    IMAGING: I personally reviewed images previously obtained from the ED  X-rays from the emergency department demonstrates an oblique fracture through the metacarpal shaft, extending into the neck region, with minimal displacement   New Medications:  No orders of the defined types were placed in this encounter.     Oliver Barre, MD  08/16/2022 11:26 AM

## 2022-08-16 NOTE — Patient Instructions (Signed)
General Cast Instructions  1.  You were placed in a cast in clinic today.  Please keep the cast material clean, dry and intact.  Please do not use anything to itch the under the cast.  If it gets itchy, you can consider taking benadryl, or similar medication.  If the cast material gets wet, place it on a towel and use a hair dryer on a low setting. 2.  Tylenol or Ibuprofen/Naproxen as needed.   3.  Recommend elevating your extremity as much as possible to help with swelling. 4.  F/u 2-3 weeks, cast off and repeat XR  

## 2022-08-16 NOTE — Telephone Encounter (Signed)
Transition Care Management Follow-up Telephone Call Date of discharge and from where: [facility]  How have you been since you were released from the hospital? Doing ok Any questions or concerns? No  Items Reviewed: Did the pt receive and understand the discharge instructions provided? Yes  Medications obtained and verified? Yes  Other? No  Any new allergies since your discharge? No  Dietary orders reviewed? No Do you have support at home? No     Follow up appointments reviewed:  PCP Hospital f/u appt confirmed? Yes  Scheduled to see  on  @ . Specialist Hospital f/u appt confirmed? No  Scheduled to see  on  @ . Are transportation arrangements needed? No  If their condition worsens, is the pt aware to call PCP or go to the Emergency Dept.? Yes Was the patient provided with contact information for the PCP's office or ED? Yes Was to pt encouraged to call back with questions or concerns? Yes

## 2022-08-16 NOTE — Telephone Encounter (Signed)
Patient needs to be seen with Dettinger for [employer]. Offered appt with Rakes 6-6 but wants to see Dettinger.

## 2022-08-17 ENCOUNTER — Ambulatory Visit (INDEPENDENT_AMBULATORY_CARE_PROVIDER_SITE_OTHER): Payer: Medicare Other | Admitting: Pharmacist

## 2022-08-17 DIAGNOSIS — S32010D Wedge compression fracture of first lumbar vertebra, subsequent encounter for fracture with routine healing: Secondary | ICD-10-CM

## 2022-08-17 DIAGNOSIS — R296 Repeated falls: Secondary | ICD-10-CM | POA: Diagnosis not present

## 2022-08-17 DIAGNOSIS — S32010A Wedge compression fracture of first lumbar vertebra, initial encounter for closed fracture: Secondary | ICD-10-CM

## 2022-08-17 DIAGNOSIS — M545 Low back pain, unspecified: Secondary | ICD-10-CM | POA: Diagnosis not present

## 2022-08-17 DIAGNOSIS — E1142 Type 2 diabetes mellitus with diabetic polyneuropathy: Secondary | ICD-10-CM | POA: Diagnosis not present

## 2022-08-17 DIAGNOSIS — Z515 Encounter for palliative care: Secondary | ICD-10-CM | POA: Diagnosis not present

## 2022-08-17 NOTE — Progress Notes (Signed)
         Current Height: 5\' 6"     Max Lifetime Height:  5\' 7"  Current Weight: 188 lbs        Ethnicity: Caucasian    HPI: Does pt already have a diagnosis of:  Osteopenia?  Yes Osteoporosis?  No, but concern for osteoporosis given second compression fracture   Back Pain?  Yes       Kyphosis?  Yes Prior fracture?  Yes compression fracture of T12 and L1 vertebra (February 2024),  hip fracture (May 2023); recent fall last week on 08/12/22--minimally displaced fracture of the left fifth metacarpal  Med(s) for Osteoporosis/Osteopenia:  alendronate Med(s) previously tried for Osteoporosis/Osteopenia:  None                                                             PMH: Post menopausal Hysterectomy?  No Oophorectomy?  No HRT? No Steroid Use?  No - topical Thyroid med?  Yes History of cancer?  No History of digestive disorders (ie Crohn's)?  No Current or previous eating disorders?  No Last Vitamin D Result:  20.4 (2016) Last GFR Result:  63 (07/17/22    FH/SH: Family history of osteoporosis?  No Parent with history of hip fracture?  No Family history of breast cancer?  No Exercise?  Yes - goes to PT twice week Smoking?  No - former Alcohol?  No      Calcium Assessment Calcium Intake  # of servings/day   Calcium mg  Milk (8 oz) 0  x  300  = 0  Yogurt (4 oz) 0 x  200 = 0  Cheese (1 oz) 0 x  200 = 0  Other Calcium sources     250mg   Ca supplement 650 = 650 mg   Estimated calcium intake per day 900 mg      DEXA Results Date of Test T-Score for AP Spine L1-L4 T-Score for Total Right Femur T-Score for Total Left Forearm  04/01/2021 Excluded due to prior spinal surgery -1.4 -0.1                               FRAX 10 year estimate from DEXA in January 2023: Total FX risk:  9.9%  (consider medication if >/= 20%) Hip FX risk:  1.5%  (consider medication if >/= 3%)   Assessment: 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   FRACTURE RISK:  INCREASED   Recommendations: 1.  Discussed options including bisphosphonate vs. denosumab (Prolia). Prolia is too expensive for patient (>$200 for 1 dose). Will continue current regimen of alendrontae weekly.  Patient reports compliance. Filled 07/27/22 2.  recommend calcium 1200mg  daily through supplementation or diet and vitamin D 800 IU daily.  3.  recommend weight bearing exercise - 30 minutes at least 4 days per week - patient goes to PT twice weekly.  4.  Counseled and educated about fall risk and prevention.   Recheck DEXA:  per PCP, typically within 2 years   Time spent counseling patient:  35 minutes  Kieth Brightly, PharmD, BCACP Clinical Pharmacist, [facility]

## 2022-08-22 ENCOUNTER — Ambulatory Visit (INDEPENDENT_AMBULATORY_CARE_PROVIDER_SITE_OTHER): Payer: Medicare Other | Admitting: Orthopedic Surgery

## 2022-08-22 ENCOUNTER — Encounter: Payer: Self-pay | Admitting: Family Medicine

## 2022-08-22 ENCOUNTER — Other Ambulatory Visit (INDEPENDENT_AMBULATORY_CARE_PROVIDER_SITE_OTHER): Payer: Medicare Other

## 2022-08-22 ENCOUNTER — Encounter: Payer: Self-pay | Admitting: Orthopedic Surgery

## 2022-08-22 ENCOUNTER — Telehealth: Payer: Self-pay | Admitting: Family Medicine

## 2022-08-22 VITALS — BP 130/69 | Ht 66.0 in | Wt 180.0 lb

## 2022-08-22 DIAGNOSIS — S62357A Nondisplaced fracture of shaft of fifth metacarpal bone, left hand, initial encounter for closed fracture: Secondary | ICD-10-CM

## 2022-08-22 DIAGNOSIS — S62357D Nondisplaced fracture of shaft of fifth metacarpal bone, left hand, subsequent encounter for fracture with routine healing: Secondary | ICD-10-CM

## 2022-08-22 NOTE — Progress Notes (Signed)
Return patient Visit  Assessment: Laura Dalton is a 72 y.o. female with the following: 1. Closed nondisplaced fracture of shaft of fifth metacarpal bone of left hand, subsequent encounter  Plan: Laura Dalton sustained a minimally displaced fracture of the left fifth metacarpal.  She was seen in clinic last week.  She was placed in a cast.  However, since then, she has fallen several times.  Her cast was loose, and had been stained.  Cast was removed, repeat radiographs demonstrates maintenance of alignment of her fifth metacarpal fracture.  We will repeat the cast, plan to see her back in 2 weeks.  I stressed the importance of her being careful, to avoid falls.  We briefly discussed her issues, and it seems as though her equilibrium has been affected.  Will plan to reach out to Dr. Louanne Skye she might need further evaluation by neurology.  Cast application - Left short arm cast -ulnar gutter   Verbal consent was obtained and the correct extremity was identified. The small and ring fingers were buddy taped together A well padded, appropriately molded short arm cast was applied to the Left arm Fingers remained warm and well perfused.   There were no sharp edges Patient tolerated the procedure well Cast care instructions were provided    Follow-up: Return in about 2 weeks (around 09/05/2022).  Subjective:  Chief Complaint  Patient presents with   Hand Injury    Left 5th metacarpal fracture patient has fallen again / cast has sharp edge palm and has loosened in wrist area.     History of Present Illness: Laura Dalton is a 72 y.o. female who returns for evaluation of left hand pain.  She was seen in clinic last week.  She was placed in a cast for her left fifth metacarpal fracture.  Unfortunately, she has sustained multiple falls.  No other injuries.  She was concerned about falling in her cast.  The cast is loose.  It has been wet.  She had notes some sharp edges.  Pain has  not gotten worse.  Review of Systems: No fevers or chills No numbness or tingling No chest pain No shortness of breath No bowel or bladder dysfunction No GI distress No headaches   Objective: BP 130/69 Comment: 08/16/22  Ht 5\' 6"  (1.676 m)   Wt 180 lb (81.6 kg)   BMI 29.05 kg/m   Physical Exam:  General: Elderly female., Alert and oriented., and No acute distress.  Ambulating slowly with a walker  After removal of the cast, there is some mild swelling on the ulnar side of the hand.  The bruising is healing.  Mild deformity is palpable.  Visibly, cannot see deformity.  Tenderness to palpation in the midshaft of the fifth metacarpal.    IMAGING: I personally ordered and reviewed the following images  X-rays of the left fifth metacarpal were obtained in clinic today.  These are compared to prior x-rays.  There is an oblique, minimally displaced fracture of the metacarpal shaft.  No interval displacement.  No obvious callus formation.  No bony lesions.  Impression: Stable left fifth metacarpal shaft fracture  New Medications:  No orders of the defined types were placed in this encounter.     Oliver Barre, MD  08/22/2022 4:35 PM

## 2022-08-22 NOTE — Telephone Encounter (Signed)
Pt wants all of her medications and refills transferred to [facility] for mail order.

## 2022-08-22 NOTE — Patient Instructions (Signed)

## 2022-08-23 NOTE — Telephone Encounter (Signed)
Pt made aware to have her current pharmacy transfer her prescriptions to the one she would like to start using.

## 2022-08-25 ENCOUNTER — Ambulatory Visit: Payer: Medicare Other | Admitting: Family Medicine

## 2022-08-25 ENCOUNTER — Encounter: Payer: Self-pay | Admitting: Family Medicine

## 2022-08-25 ENCOUNTER — Ambulatory Visit (INDEPENDENT_AMBULATORY_CARE_PROVIDER_SITE_OTHER): Payer: Medicare Other | Admitting: Family Medicine

## 2022-08-25 VITALS — Ht 66.0 in | Wt 185.0 lb

## 2022-08-25 DIAGNOSIS — G8929 Other chronic pain: Secondary | ICD-10-CM | POA: Diagnosis not present

## 2022-08-25 DIAGNOSIS — M549 Dorsalgia, unspecified: Secondary | ICD-10-CM | POA: Diagnosis not present

## 2022-08-25 DIAGNOSIS — N3946 Mixed incontinence: Secondary | ICD-10-CM

## 2022-08-25 DIAGNOSIS — S32010D Wedge compression fracture of first lumbar vertebra, subsequent encounter for fracture with routine healing: Secondary | ICD-10-CM

## 2022-08-25 DIAGNOSIS — M545 Low back pain, unspecified: Secondary | ICD-10-CM | POA: Diagnosis not present

## 2022-08-25 DIAGNOSIS — S32010A Wedge compression fracture of first lumbar vertebra, initial encounter for closed fracture: Secondary | ICD-10-CM

## 2022-08-25 DIAGNOSIS — R222 Localized swelling, mass and lump, trunk: Secondary | ICD-10-CM

## 2022-08-25 LAB — CBC WITH DIFFERENTIAL/PLATELET
Basophils Absolute: 0 10*3/uL (ref 0.0–0.2)
Basos: 0 %
EOS (ABSOLUTE): 0.5 10*3/uL — ABNORMAL HIGH (ref 0.0–0.4)
Eos: 6 %
Hematocrit: 38.1 % (ref 34.0–46.6)
Hemoglobin: 12.2 g/dL (ref 11.1–15.9)
Immature Grans (Abs): 0 10*3/uL (ref 0.0–0.1)
Immature Granulocytes: 0 %
Lymphocytes Absolute: 1.3 10*3/uL (ref 0.7–3.1)
Lymphs: 17 %
MCH: 26.9 pg (ref 26.6–33.0)
MCHC: 32 g/dL (ref 31.5–35.7)
MCV: 84 fL (ref 79–97)
Monocytes Absolute: 0.7 10*3/uL (ref 0.1–0.9)
Monocytes: 9 %
Neutrophils Absolute: 5 10*3/uL (ref 1.4–7.0)
Neutrophils: 68 %
Platelets: 279 10*3/uL (ref 150–450)
RBC: 4.54 x10E6/uL (ref 3.77–5.28)
RDW: 12.7 % (ref 11.7–15.4)
WBC: 7.5 10*3/uL (ref 3.4–10.8)

## 2022-08-25 LAB — CMP14+EGFR
ALT: 6 IU/L (ref 0–32)
AST: 14 IU/L (ref 0–40)
Albumin/Globulin Ratio: 1.8
Albumin: 4.2 g/dL (ref 3.8–4.8)
Alkaline Phosphatase: 88 IU/L (ref 44–121)
BUN/Creatinine Ratio: 11 — ABNORMAL LOW (ref 12–28)
BUN: 10 mg/dL (ref 8–27)
Bilirubin Total: 0.2 mg/dL (ref 0.0–1.2)
CO2: 26 mmol/L (ref 20–29)
Calcium: 9.5 mg/dL (ref 8.7–10.3)
Chloride: 104 mmol/L (ref 96–106)
Creatinine, Ser: 0.92 mg/dL (ref 0.57–1.00)
Globulin, Total: 2.3 g/dL (ref 1.5–4.5)
Glucose: 115 mg/dL — ABNORMAL HIGH (ref 70–99)
Potassium: 4.8 mmol/L (ref 3.5–5.2)
Sodium: 142 mmol/L (ref 134–144)
Total Protein: 6.5 g/dL (ref 6.0–8.5)
eGFR: 66 mL/min/{1.73_m2} (ref >59)

## 2022-08-25 MED ORDER — TOLTERODINE TARTRATE ER 2 MG PO CP24
2.0000 mg | ORAL_CAPSULE | Freq: Every day | ORAL | 2 refills | Status: DC
Start: 2022-08-25 — End: 2022-10-23

## 2022-08-25 MED ORDER — MIRABEGRON ER 25 MG PO TB24
25.0000 mg | ORAL_TABLET | Freq: Every day | ORAL | 1 refills | Status: DC
Start: 2022-08-25 — End: 2022-08-25

## 2022-08-25 MED ORDER — METHOCARBAMOL 500 MG PO TABS
500.0000 mg | ORAL_TABLET | Freq: Three times a day (TID) | ORAL | 1 refills | Status: DC | PRN
Start: 2022-08-25 — End: 2022-10-23

## 2022-08-25 NOTE — Progress Notes (Signed)
Ht 5\' 6"  (1.676 m)   Wt 185 lb (83.9 kg)   SpO2 93%   BMI 29.86 kg/m    Subjective:   Patient ID: Laura Dalton, female    DOB: 1950-12-01, 72 y.o.   MRN: [id number]  HPI: [name] is a 72 y.o. female presenting on 08/25/2022 for Hospitalization Follow-up, Discuss possible thoracic mass  (Per Lucrezia Europe, NP), and Discuss referral for Home Health   HPI Urinary incontinence Patient is having urinary urge and stress incontinence where she has to get to the bathroom fast.  It happens when she stands up or sometimes when she coughs or sneezes.  She is wondering if there is something that can help.  Her daughter did just get her bedside commode to hopefully help as well.  Denies any dysuria or abdominal pain or flank pain.  Patient has chronic thoracic and lumbar back pain.  She has had surgeries in her low back and now she has had multiple compression fractures in the thoracic back.  She had an MRI in 2020 that showed an osseous lesion with orthopedic and it has not been followed up since then and it was recommended that we follow-up on it.  Will order an MRI for this.  She still feels off balance and has falls which is caused compression fractures and now a wrist fracture.  Will do home health to help with her for this.  She feels off balance when standing.  She denies any lightheadedness or dizziness that she knows of.  Her memory is fading though so she is not the best historian.  Relevant past medical, surgical, family and social history reviewed and updated as indicated. Interim medical history since our last visit reviewed. Allergies and medications reviewed and updated.  Review of Systems  Constitutional:  Negative for chills and fever.  Respiratory:  Negative for chest tightness and shortness of breath.   Cardiovascular:  Negative for chest pain and leg swelling.  Genitourinary:  Positive for urgency. Negative for difficulty urinating, dysuria and frequency.   Musculoskeletal:  Positive for arthralgias, back pain, gait problem and myalgias.  Skin:  Negative for rash.  Neurological:  Positive for weakness. Negative for dizziness, light-headedness and headaches.  Psychiatric/Behavioral:  Negative for agitation and behavioral problems.   All other systems reviewed and are negative.   Per HPI unless specifically indicated above   Allergies as of 08/25/2022       Reactions   Aspirin Hives   Gabapentin Other (See Comments)   Dizzy,falls        Medication List        Accurate as of August 25, 2022  9:38 AM. If you have any questions, ask your nurse or doctor.          albuterol 108 (90 Base) MCG/ACT inhaler Commonly known as: VENTOLIN HFA 2 puffs every 6 hours as needed for wheezing or shortness of breath   alendronate 70 MG tablet Commonly known as: FOSAMAX Take 1 tablet (70 mg total) by mouth every 7 (seven) days. Take with a full glass of water on an empty stomach.   amitriptyline 100 MG tablet Commonly known as: ELAVIL Take 1 tablet (100 mg total) by mouth at bedtime.   atorvastatin 40 MG tablet Commonly known as: LIPITOR Take 1 tablet (40 mg total) by mouth daily.   calcium carbonate 1500 (600 Ca) MG Tabs tablet Commonly known as: OSCAL Take by mouth 2 (two) times daily with a meal.  clopidogrel 75 MG tablet Commonly known as: PLAVIX Take 1 tablet (75 mg total) by mouth daily.   DULoxetine 60 MG capsule Commonly known as: Cymbalta Take 1 capsule (60 mg total) by mouth daily.   levothyroxine 125 MCG tablet Commonly known as: SYNTHROID Take 125 mcg by mouth daily before breakfast.   lisinopril 40 MG tablet Commonly known as: ZESTRIL Take 1 tablet (40 mg total) by mouth daily.   methocarbamol 500 MG tablet Commonly known as: ROBAXIN Take 1 tablet (500 mg total) by mouth every 8 (eight) hours as needed for muscle spasms. Started by: Nils Pyle, MD   mirabegron ER 25 MG Tb24 tablet Commonly known as:  Myrbetriq Take 1 tablet (25 mg total) by mouth daily. Started by: Nils Pyle, MD   tiZANidine 4 MG tablet Commonly known as: Zanaflex Take 1 tablet (4 mg total) by mouth every 8 (eight) hours as needed for muscle spasms.   traMADol 50 MG tablet Commonly known as: ULTRAM Take 1 tablet (50 mg total) by mouth every 6 (six) hours as needed.   triamcinolone cream 0.1 % Commonly known as: KENALOG Apply 1 application topically 2 (two) times daily.               Durable Medical Equipment  (From admission, onward)           Start     Ordered   08/25/22 0000  For home use only DME Other see comment       Comments: Recurrent falls Life alert  Question:  Length of Need  Answer:  Lifetime   08/25/22 0934             Objective:   Ht 5\' 6"  (1.676 m)   Wt 185 lb (83.9 kg)   SpO2 93%   BMI 29.86 kg/m   Wt Readings from Last 3 Encounters:  08/25/22 185 lb (83.9 kg)  08/22/22 180 lb (81.6 kg)  08/16/22 180 lb (81.6 kg)    Physical Exam Vitals and nursing note reviewed.  Constitutional:      General: She is not in acute distress.    Appearance: She is well-developed. She is not diaphoretic.  Cardiovascular:     Rate and Rhythm: Normal rate and regular rhythm.     Heart sounds: Normal heart sounds. No murmur heard. Pulmonary:     Effort: Pulmonary effort is normal. No respiratory distress.     Breath sounds: Normal breath sounds. No wheezing.  Musculoskeletal:        General: Swelling present.  Skin:    General: Skin is warm and dry.     Findings: No rash.  Neurological:     Mental Status: She is alert and oriented to person, place, and time.     Coordination: Coordination normal.  Psychiatric:        Behavior: Behavior normal.        Cognition and Memory: Memory is impaired. She exhibits impaired recent memory.       Assessment & Plan:   Problem List Items Addressed This Visit       Musculoskeletal and Integument   Lumbar compression  fracture (HCC) - Primary   Relevant Medications   methocarbamol (ROBAXIN) 500 MG tablet   Other Relevant Orders   For home use only DME Other see comment   Ambulatory referral to Home Health     Other   Chronic back pain   Relevant Medications   methocarbamol (ROBAXIN) 500 MG tablet  Other Visit Diagnoses     Lumbar pain       Relevant Medications   methocarbamol (ROBAXIN) 500 MG tablet   Mixed stress and urge urinary incontinence       Relevant Medications   mirabegron ER (MYRBETRIQ) 25 MG TB24 tablet   Mass of thoracic structure       Relevant Orders   MR Thoracic Spine Wo Contrast       Will switch to Robaxin to see if it does better than the tizanidine and does not cause dizziness or lightheadedness.  Told daughter to watch her closely and be very cautious with taking it.  Will order MRI because of history of thoracic mass on MRI in 2020 that needs follow-up.  Ordered home health for physical therapy and aide at home.  Ordered life right. Follow up plan: Return if symptoms worsen or fail to improve.  Counseling provided for all of the vaccine components Orders Placed This Encounter  Procedures   For home use only DME Other see comment   MR Thoracic Spine Wo Contrast   Ambulatory referral to Home Health    Arville Care, MD [facility] 08/25/2022, 9:38 AM

## 2022-08-28 ENCOUNTER — Encounter: Payer: Self-pay | Admitting: Family Medicine

## 2022-09-01 ENCOUNTER — Encounter: Payer: Self-pay | Admitting: Family Medicine

## 2022-09-05 ENCOUNTER — Encounter: Payer: Medicare Other | Admitting: Orthopedic Surgery

## 2022-09-05 ENCOUNTER — Encounter: Payer: Self-pay | Admitting: Pharmacist

## 2022-09-06 ENCOUNTER — Ambulatory Visit (INDEPENDENT_AMBULATORY_CARE_PROVIDER_SITE_OTHER): Payer: Medicare Other | Admitting: Orthopedic Surgery

## 2022-09-06 ENCOUNTER — Other Ambulatory Visit (INDEPENDENT_AMBULATORY_CARE_PROVIDER_SITE_OTHER): Payer: Medicare Other

## 2022-09-06 ENCOUNTER — Encounter: Payer: Self-pay | Admitting: Orthopedic Surgery

## 2022-09-06 ENCOUNTER — Encounter: Payer: Medicare Other | Admitting: Orthopedic Surgery

## 2022-09-06 DIAGNOSIS — S62357D Nondisplaced fracture of shaft of fifth metacarpal bone, left hand, subsequent encounter for fracture with routine healing: Secondary | ICD-10-CM

## 2022-09-06 NOTE — Patient Instructions (Signed)
Brace at all times for the next 2 weeks.  After that, okay to remove and start working on range of motion.  Careful bearing weight through your wrist.  Brace should provide some protection.  Follow-up in 4 weeks.  If you have issues before then, please call.

## 2022-09-06 NOTE — Progress Notes (Signed)
Return patient Visit  Assessment: Laura Dalton is a 72 y.o. female with the following: 1. Closed nondisplaced fracture of shaft of fifth metacarpal bone of left hand, subsequent encounter  Plan: Laura Dalton is a minimally displaced fracture of the left fifth metacarpal.  No gross motion on physical exam.  No redness or swelling.  Radiographs are stable.  She preferred to transition to a brace, I think it is reasonable.  I have asked her to be careful.  Bear weight through the radial aspect of the wrist.  Brace at all times for the next 2 weeks.  After that, she can start working on range of motion.  Follow-up in 1 month.   Follow-up: No follow-ups on file.  Subjective:  Chief Complaint  Patient presents with   Fracture    L hand DOI 08/11/22    History of Present Illness: Laura Dalton is a 72 y.o. female who returns for evaluation of left hand pain.  She sustained a left fifth metacarpal fracture approximately 4 weeks ago.  She has been immobilized since.  Her pain is improving.  She has minimal swelling.  She has good range of motion after removal of the cast.     Review of Systems: No fevers or chills No numbness or tingling No chest pain No shortness of breath No bowel or bladder dysfunction No GI distress No headaches   Objective: There were no vitals taken for this visit.  Physical Exam:  General: Elderly female., Alert and oriented., and No acute distress.  Ambulating slowly with a walker  Left hand without swelling.  No bruising.  No gross motion at the fracture site.  With deep palpation, she does have a little bit of pain along the shaft of the fifth metacarpal.  Fingers are warm and well-perfused.  She is able make full fist.   IMAGING: I personally ordered and reviewed the following images  X-rays of the left fifth metacarpal were obtained in clinic today.  These were compared to available x-rays.  There is an oblique fracture of the  midshaft of the fifth metacarpal.  This remains in stable alignment.  No interval displacement.  No obvious callus formation.  No bony lesions.   Skin: Stable left fifth metacarpal shaft fracture.  New Medications:  No orders of the defined types were placed in this encounter.     Oliver Barre, MD  09/06/2022 2:22 PM

## 2022-09-07 ENCOUNTER — Telehealth: Payer: Medicare Other

## 2022-09-12 ENCOUNTER — Telehealth: Payer: Self-pay | Admitting: Orthopedic Surgery

## 2022-09-12 NOTE — Telephone Encounter (Signed)
Dr. Dallas Schimke pt - spoke w/Mary w/[facility] [phone], she stated that are unable to accept this patient due to insurance restrictions.

## 2022-09-14 ENCOUNTER — Encounter: Payer: Self-pay | Admitting: Family Medicine

## 2022-09-15 ENCOUNTER — Other Ambulatory Visit: Payer: Self-pay

## 2022-09-15 MED ORDER — LISINOPRIL 40 MG PO TABS: 40.0000 mg | ORAL_TABLET | Freq: Every day | ORAL | 3 refills | Status: DC

## 2022-09-15 NOTE — Telephone Encounter (Signed)
Called and left VM that we did not order HH for this pt.

## 2022-09-20 ENCOUNTER — Ambulatory Visit: Payer: Medicare Other | Admitting: Family Medicine

## 2022-09-21 ENCOUNTER — Telehealth: Payer: Medicare Other

## 2022-09-21 ENCOUNTER — Ambulatory Visit (HOSPITAL_COMMUNITY)
Admission: RE | Admit: 2022-09-21 | Discharge: 2022-09-21 | Disposition: A | Discharge status: Home / Self Care | Payer: Medicare Other | Source: Ambulatory Visit | Attending: Family Medicine | Admitting: Family Medicine

## 2022-09-21 DIAGNOSIS — M5104 Intervertebral disc disorders with myelopathy, thoracic region: Secondary | ICD-10-CM | POA: Diagnosis not present

## 2022-09-21 DIAGNOSIS — R222 Localized swelling, mass and lump, trunk: Secondary | ICD-10-CM | POA: Diagnosis not present

## 2022-09-27 ENCOUNTER — Encounter: Payer: Self-pay | Admitting: Family Medicine

## 2022-09-27 DIAGNOSIS — E1169 Type 2 diabetes mellitus with other specified complication: Secondary | ICD-10-CM

## 2022-09-27 DIAGNOSIS — E1142 Type 2 diabetes mellitus with diabetic polyneuropathy: Secondary | ICD-10-CM

## 2022-09-28 ENCOUNTER — Telehealth: Payer: Self-pay | Admitting: Family Medicine

## 2022-09-28 DIAGNOSIS — S32010A Wedge compression fracture of first lumbar vertebra, initial encounter for closed fracture: Secondary | ICD-10-CM

## 2022-09-28 MED ORDER — ALENDRONATE SODIUM 70 MG PO TABS
70.0000 mg | ORAL_TABLET | ORAL | 1 refills | Status: DC
Start: 2022-09-28 — End: 2022-10-02

## 2022-09-28 NOTE — Telephone Encounter (Signed)
Laura Dalton, will you please review and then I will call the daughter back

## 2022-09-28 NOTE — Telephone Encounter (Signed)
Daughter wants to know what is the process for mail order delivery because it has been a week from the first request of med refills.  Can a 2 week supply orf alendronate (FOSAMAX) 70 MG tablet be called in to [facility] in [facility] until the mail order comes in. Please call back

## 2022-09-28 NOTE — Telephone Encounter (Signed)
Patient's daughter r/c

## 2022-09-28 NOTE — Telephone Encounter (Signed)
Laura Dalton sent in two week supply of fosamax

## 2022-09-28 NOTE — Telephone Encounter (Signed)
Patients daughter notified that radiologist had not read the result yet and are still behind. Advised daughter that we would contact her with results as soon as they are available. Daughter verbalized understanding

## 2022-09-28 NOTE — Telephone Encounter (Signed)
Lmtcb.

## 2022-09-28 NOTE — Telephone Encounter (Signed)
MR results are not available  yet

## 2022-09-29 ENCOUNTER — Other Ambulatory Visit: Payer: Self-pay | Admitting: Family Medicine

## 2022-09-29 ENCOUNTER — Encounter: Payer: Self-pay | Admitting: Family Medicine

## 2022-10-02 ENCOUNTER — Other Ambulatory Visit: Payer: Self-pay

## 2022-10-02 ENCOUNTER — Telehealth: Payer: Self-pay | Admitting: *Deleted

## 2022-10-02 ENCOUNTER — Other Ambulatory Visit: Payer: Self-pay | Admitting: Family Medicine

## 2022-10-02 DIAGNOSIS — I152 Hypertension secondary to endocrine disorders: Secondary | ICD-10-CM

## 2022-10-02 DIAGNOSIS — G9589 Other specified diseases of spinal cord: Secondary | ICD-10-CM

## 2022-10-02 DIAGNOSIS — E1142 Type 2 diabetes mellitus with diabetic polyneuropathy: Secondary | ICD-10-CM

## 2022-10-02 DIAGNOSIS — S32010A Wedge compression fracture of first lumbar vertebra, initial encounter for closed fracture: Secondary | ICD-10-CM

## 2022-10-02 DIAGNOSIS — E1169 Type 2 diabetes mellitus with other specified complication: Secondary | ICD-10-CM

## 2022-10-02 MED ORDER — ALENDRONATE SODIUM 70 MG PO TABS
70.0000 mg | ORAL_TABLET | ORAL | 1 refills | Status: DC
Start: 2022-10-02 — End: 2022-10-23

## 2022-10-02 NOTE — Progress Notes (Signed)
Placed referral to [facility]/community care medicine for the patient.

## 2022-10-02 NOTE — Progress Notes (Signed)
  Care Coordination   Note   10/02/2022 Name: Laura Dalton MRN: [id number] DOB: 1950-07-05  [name] is a 72 y.o. year old female who sees Dettinger, Elige Radon, MD for primary care. I reached out to [facility] by phone today to offer care coordination services.  [name] was given information about Care Coordination services today including:   The Care Coordination services include support from the care team which includes your Nurse Coordinator, Clinical Social Worker, or Pharmacist.  The Care Coordination team is here to help remove barriers to the health concerns and goals most important to you. Care Coordination services are voluntary, and the patient may decline or stop services at any time by request to their care team member.   Care Coordination Consent Status: Patient daughter [name] DPR on file agreed to services and verbal consent obtained.   Follow up plan:  Telephone appointment with care coordination team member scheduled for:  10/10/22  Encounter Outcome:  Pt. Scheduled  [facility] Coordination Care Guide  Direct Dial: [phone]

## 2022-10-02 NOTE — Telephone Encounter (Signed)
Done

## 2022-10-03 DIAGNOSIS — R269 Unspecified abnormalities of gait and mobility: Secondary | ICD-10-CM | POA: Diagnosis not present

## 2022-10-06 ENCOUNTER — Other Ambulatory Visit: Payer: Self-pay | Admitting: Family Medicine

## 2022-10-06 ENCOUNTER — Other Ambulatory Visit: Payer: Self-pay

## 2022-10-06 ENCOUNTER — Encounter: Payer: Self-pay | Admitting: Orthopedic Surgery

## 2022-10-06 ENCOUNTER — Ambulatory Visit (INDEPENDENT_AMBULATORY_CARE_PROVIDER_SITE_OTHER): Payer: Medicare Other | Admitting: Orthopedic Surgery

## 2022-10-06 DIAGNOSIS — S62357D Nondisplaced fracture of shaft of fifth metacarpal bone, left hand, subsequent encounter for fracture with routine healing: Secondary | ICD-10-CM

## 2022-10-06 DIAGNOSIS — E1169 Type 2 diabetes mellitus with other specified complication: Secondary | ICD-10-CM

## 2022-10-06 MED ORDER — ATORVASTATIN CALCIUM 40 MG PO TABS
40.0000 mg | ORAL_TABLET | Freq: Every day | ORAL | 0 refills | Status: DC
Start: 2022-10-06 — End: 2022-10-23

## 2022-10-06 MED ORDER — CLOPIDOGREL BISULFATE 75 MG PO TABS: 75.0000 mg | ORAL_TABLET | Freq: Every day | ORAL | 0 refills | Status: DC

## 2022-10-06 MED ORDER — ATORVASTATIN CALCIUM 40 MG PO TABS
40.0000 mg | ORAL_TABLET | Freq: Every day | ORAL | 0 refills | Status: DC
Start: 2022-10-06 — End: 2022-10-06

## 2022-10-06 MED ORDER — AMITRIPTYLINE HCL 100 MG PO TABS: 100.0000 mg | ORAL_TABLET | Freq: Every day | ORAL | 0 refills | Status: DC

## 2022-10-06 MED ORDER — DULOXETINE HCL 60 MG PO CPEP: 60.0000 mg | ORAL_CAPSULE | Freq: Every day | ORAL | 0 refills | Status: DC

## 2022-10-06 MED ORDER — LEVOTHYROXINE SODIUM 125 MCG PO TABS: 125.0000 ug | ORAL_TABLET | Freq: Every day | ORAL | 0 refills | Status: DC

## 2022-10-06 MED ORDER — AMITRIPTYLINE HCL 100 MG PO TABS
100.0000 mg | ORAL_TABLET | Freq: Every day | ORAL | 0 refills | Status: DC
Start: 2022-10-06 — End: 2022-10-23

## 2022-10-06 NOTE — Progress Notes (Signed)
Return patient Visit  Assessment: Laura Dalton is a 72 y.o. female with the following: 1. Closed nondisplaced fracture of shaft of fifth metacarpal bone of left hand, subsequent encounter  Plan: Laura Dalton is a minimally displaced fracture of the left fifth metacarpal.  She has no pain in her left hand.  She has full range of motion.  She has good grip strength.  Radiographs demonstrates maintenance of alignment, with some callus formation.  She can remove the brace, and slowly increase her activities.  She stated her understanding.  She will follow-up as needed.  Follow-up: Return if symptoms worsen or fail to improve.  Subjective:  Chief Complaint  Patient presents with   Follow-up    Recheck on left hand.    History of Present Illness: Laura Dalton is a 72 y.o. female who returns for evaluation of left hand pain.  She sustained a left fifth metacarpal fracture approximately 2 months ago.  She has been immobilized using a brace for the past month.  She has no pain.  No numbness or tingling.  She uses a walker, and must use her left hand.   Review of Systems: No fevers or chills No numbness or tingling No chest pain No shortness of breath No bowel or bladder dysfunction No GI distress No headaches   Objective: There were no vitals taken for this visit.  Physical Exam:  General: Elderly female., Alert and oriented., and No acute distress.  Ambulating with a walker  Evaluation left hand demonstrates no swelling.  No bruising.  Mild skin irritation from the brace.  Fingers warm and well-perfused.  She has full range of motion.  Good grip strength.   IMAGING: I personally ordered and reviewed the following images  X-rays of the left hand were obtained in clinic today.  These are compared to prior x-rays.  There has been no change in overall alignment of the fifth metacarpal shaft fracture.  There is some callus formation.  No new injuries.  No bony  lesions.  Impression: Healed left fifth metacarpal shaft fracture  New Medications:  No orders of the defined types were placed in this encounter.     Oliver Barre, MD  10/06/2022 9:45 AM

## 2022-10-06 NOTE — Addendum Note (Signed)
Addended by: Ignacia Bayley on: 10/06/2022 08:46 AM   Modules accepted: Orders

## 2022-10-06 NOTE — Addendum Note (Signed)
Addended by: Ignacia Bayley on: 10/06/2022 08:48 AM   Modules accepted: Orders

## 2022-10-10 ENCOUNTER — Ambulatory Visit: Payer: Self-pay | Admitting: *Deleted

## 2022-10-10 NOTE — Patient Outreach (Signed)
  Care Coordination   10/10/2022 Name: Laura Dalton MRN: [id number] DOB: December 27, 1950   Care Coordination Outreach Attempts:  An unsuccessful telephone outreach was attempted for a scheduled appointment today.  Follow Up Plan:  Additional outreach attempts will be made to offer the patient care coordination information and services.   Encounter Outcome:  No Answer   Care Coordination Interventions:  No, not indicated    Andreka Stucki L. Noelle Penner, RN, BSN, CCM [facility] Care Management Community Coordinator Office number [phone]

## 2022-10-14 DIAGNOSIS — R269 Unspecified abnormalities of gait and mobility: Secondary | ICD-10-CM | POA: Diagnosis not present

## 2022-10-17 ENCOUNTER — Ambulatory Visit: Payer: Self-pay | Admitting: *Deleted

## 2022-10-17 ENCOUNTER — Encounter: Payer: Self-pay | Admitting: *Deleted

## 2022-10-17 DIAGNOSIS — M47816 Spondylosis without myelopathy or radiculopathy, lumbar region: Secondary | ICD-10-CM | POA: Insufficient documentation
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
## 2022-10-17 NOTE — Patient Instructions (Addendum)
Visit Information  Thank you for taking time to visit with me today. Please don't hesitate to contact me if I can be of assistance to you.   Following are the goals we discussed today:   Goals Addressed               This Visit's Progress     Patient Stated     [facility] care coordination services (pt-stated)   On track     Interventions Today    Flowsheet Row Most Recent Value  Chronic Disease   Chronic disease during today's visit Diabetes, Chronic Obstructive Pulmonary Disease (COPD), Other, Hypertension (HTN)  [fracture of finger on left hand, back pain]  General Interventions   General Interventions Discussed/Reviewed General Interventions Discussed, Doctor Visits, Durable Medical Equipment (DME), Community Resources  Doctor Visits Discussed/Reviewed Doctor Visits Discussed, PCP, Specialist  Durable Medical Equipment (DME) Walker  PCP/Specialist Visits Compliance with follow-up visit  Exercise Interventions   Exercise Discussed/Reviewed Exercise Discussed, Physical Activity, Assistive device use and maintanence  Physical Activity Discussed/Reviewed --  [assessed back & hand pain]  Education Interventions   Education Provided Provided Web-based Education, Provided Education  [COPD definition, action plan/medicines, Hypertension & diabetes diagnoses, Hemaglobin A1c, prediabetes, gestational diabetes, reasons to see a cardiologist, when to see a pcp Office manager based education for these topics]  Provided Verbal Education On Labs, Blood Sugar Monitoring, [employer], [employer] Reviewed Hgb A1c  Mental Health Interventions   Mental Health Discussed/Reviewed --  [discussed memory changes]  Nutrition Interventions   Nutrition Discussed/Reviewed Nutrition Discussed, Decreasing fats  Pharmacy Interventions   Pharmacy Dicussed/Reviewed Pharmacy Topics Discussed, Affording Medications  Safety Interventions   Safety Discussed/Reviewed Safety Discussed, Fall Risk              Our next appointment is by telephone on 11/06/22 at 0930  Please call the care guide team at [phone] if you need to cancel or reschedule your appointment.   If you are experiencing a Mental Health or Behavioral Health Crisis or need someone to talk to, please call the Suicide and Crisis Lifeline: 988 call the [place] National Suicide Prevention Lifeline: 817-009-4239 or TTY: 769-684-3772 TTY (207)723-9307) to talk to a trained counselor call 1-800-273-TALK (toll free, 24 hour hotline) call the [facility]: [phone] call 911   Patient verbalizes understanding of instructions and care plan provided today and agrees to view in MyChart. Active MyChart status and patient understanding of how to access instructions and care plan via MyChart confirmed with patient.     The patient has been provided with contact information for the care management team and has been advised to call with any health related questions or concerns.   Raphel Stickles L. Noelle Penner, RN, BSN, CCM [facility] Care Management Community Coordinator Office number [phone]

## 2022-10-18 ENCOUNTER — Encounter: Payer: Self-pay | Admitting: Family Medicine

## 2022-10-18 DIAGNOSIS — M4714 Other spondylosis with myelopathy, thoracic region: Secondary | ICD-10-CM | POA: Diagnosis not present

## 2022-10-18 DIAGNOSIS — M47816 Spondylosis without myelopathy or radiculopathy, lumbar region: Secondary | ICD-10-CM | POA: Diagnosis not present

## 2022-10-20 ENCOUNTER — Encounter: Payer: Self-pay | Admitting: Family Medicine

## 2022-10-23 ENCOUNTER — Ambulatory Visit (INDEPENDENT_AMBULATORY_CARE_PROVIDER_SITE_OTHER): Payer: Medicare Other | Admitting: Family Medicine

## 2022-10-23 ENCOUNTER — Encounter: Payer: Self-pay | Admitting: Family Medicine

## 2022-10-23 ENCOUNTER — Ambulatory Visit: Payer: Medicare Other

## 2022-10-23 VITALS — BP 110/62 | HR 89 | Temp 96.9°F | Ht 66.0 in | Wt 181.2 lb

## 2022-10-23 DIAGNOSIS — S32010D Wedge compression fracture of first lumbar vertebra, subsequent encounter for fracture with routine healing: Secondary | ICD-10-CM | POA: Diagnosis not present

## 2022-10-23 DIAGNOSIS — I152 Hypertension secondary to endocrine disorders: Secondary | ICD-10-CM

## 2022-10-23 DIAGNOSIS — S32010A Wedge compression fracture of first lumbar vertebra, initial encounter for closed fracture: Secondary | ICD-10-CM

## 2022-10-23 DIAGNOSIS — E1159 Type 2 diabetes mellitus with other circulatory complications: Secondary | ICD-10-CM | POA: Diagnosis not present

## 2022-10-23 DIAGNOSIS — E1169 Type 2 diabetes mellitus with other specified complication: Secondary | ICD-10-CM

## 2022-10-23 DIAGNOSIS — M545 Low back pain, unspecified: Secondary | ICD-10-CM

## 2022-10-23 DIAGNOSIS — J4520 Mild intermittent asthma, uncomplicated: Secondary | ICD-10-CM

## 2022-10-23 DIAGNOSIS — E785 Hyperlipidemia, unspecified: Secondary | ICD-10-CM

## 2022-10-23 DIAGNOSIS — R413 Other amnesia: Secondary | ICD-10-CM

## 2022-10-23 DIAGNOSIS — E1142 Type 2 diabetes mellitus with diabetic polyneuropathy: Secondary | ICD-10-CM

## 2022-10-23 LAB — BAYER DCA HB A1C WAIVED: HB A1C (BAYER DCA - WAIVED): 5.9 % — ABNORMAL HIGH (ref 4.8–5.6)

## 2022-10-23 MED ORDER — LEVOTHYROXINE SODIUM 125 MCG PO TABS: 125.0000 ug | ORAL_TABLET | Freq: Every day | ORAL | 0 refills | Status: DC

## 2022-10-23 MED ORDER — ALENDRONATE SODIUM 70 MG PO TABS
70.0000 mg | ORAL_TABLET | ORAL | 1 refills | Status: DC
Start: 2022-10-23 — End: 2022-12-01

## 2022-10-23 MED ORDER — DONEPEZIL HCL 10 MG PO TABS: 10.0000 mg | ORAL_TABLET | Freq: Every day | ORAL | 3 refills | Status: DC

## 2022-10-23 MED ORDER — DULOXETINE HCL 60 MG PO CPEP: 60.0000 mg | ORAL_CAPSULE | Freq: Every day | ORAL | 0 refills | Status: DC

## 2022-10-23 MED ORDER — ATORVASTATIN CALCIUM 40 MG PO TABS
40.0000 mg | ORAL_TABLET | Freq: Every day | ORAL | 0 refills | Status: DC
Start: 2022-10-23 — End: 2022-11-14

## 2022-10-23 MED ORDER — TRAMADOL HCL 50 MG PO TABS: 50.0000 mg | ORAL_TABLET | Freq: Four times a day (QID) | ORAL | 0 refills | Status: DC | PRN

## 2022-10-23 MED ORDER — ALBUTEROL SULFATE HFA 108 (90 BASE) MCG/ACT IN AERS
INHALATION_SPRAY | RESPIRATORY_TRACT | 0 refills | Status: DC
Start: 2022-10-23 — End: 2023-07-10

## 2022-10-23 MED ORDER — AMITRIPTYLINE HCL 100 MG PO TABS
100.0000 mg | ORAL_TABLET | Freq: Every day | ORAL | 0 refills | Status: DC
Start: 2022-10-23 — End: 2022-11-14

## 2022-10-23 MED ORDER — DONEPEZIL HCL 10 MG PO TABS
10.0000 mg | ORAL_TABLET | Freq: Every day | ORAL | 3 refills | Status: DC
Start: 2022-10-23 — End: 2022-10-24

## 2022-10-23 MED ORDER — LISINOPRIL 40 MG PO TABS: 40.0000 mg | ORAL_TABLET | Freq: Every day | ORAL | 3 refills | Status: DC

## 2022-10-23 MED ORDER — CLOPIDOGREL BISULFATE 75 MG PO TABS: 75.0000 mg | ORAL_TABLET | Freq: Every day | ORAL | 0 refills | Status: DC

## 2022-10-23 NOTE — Addendum Note (Signed)
Addended by: Tamera Punt on: 10/23/2022 04:00 PM   Modules accepted: Orders

## 2022-10-23 NOTE — Progress Notes (Signed)
BP 110/62   Pulse 89   Temp (!) 96.9 F (36.1 C) (Temporal)   Ht 5\' 6"  (1.676 m)   Wt 181 lb 3.2 oz (82.2 kg)   SpO2 93%   BMI 29.25 kg/m    Subjective:   Patient ID: Laura Dalton, female    DOB: 11/05/1950, 72 y.o.   MRN: [id number]  HPI: Laura Dalton is a 72 y.o. female presenting on 10/23/2022 for No chief complaint on file.   HPI Memory issues Patient has been brought in by family to discuss memory issues.  She has been having some off-and-on memory issues for some time that they are concerned about.  She says she is not having too much memory issues but daughter is concerned about it.  She says she especially struggles in the evening when she sundown's.    10/23/2022    3:00 PM 09/03/2017    9:30 AM 08/31/2016   11:51 AM  MMSE - Mini Mental State Exam  Orientation to time 4 5 5   Orientation to Place 4 5 5   Registration 3 3 3   Attention/ Calculation 5 5 5   Recall 3 2 3   Language- name 2 objects 2 2 2   Language- repeat 1 1 1   Language- follow 3 step command 3 3 3   Language- read & follow direction 1 1 1   Write a sentence 1 1 1   Copy design 0 1 1  Total score 27 29 30     Type 2 diabetes mellitus Patient comes in today for recheck of his diabetes. Patient has been currently taking no medicine currently, diet control. Patient is currently on an ACE inhibitor/ARB. Patient has not seen an ophthalmologist this year. Patient denies any new issues with their feet. The symptom started onset as an adult hyperlipidemia and hypertension ARE RELATED TO DM   Hyperlipidemia Patient is coming in for recheck of his hyperlipidemia. The patient is currently taking atorvastatin. They deny any issues with myalgias or history of liver damage from it. They deny any focal numbness or weakness or chest pain.   Hypertension Patient is currently on lisinopril, and their blood pressure today is 110/62. Patient denies any lightheadedness or dizziness. Patient denies headaches, blurred  vision, chest pains, shortness of breath, or weakness. Denies any side effects from medication and is content with current medication.   Relevant past medical, surgical, family and social history reviewed and updated as indicated. Interim medical history since our last visit reviewed. Allergies and medications reviewed and updated.  Review of Systems  Constitutional:  Negative for chills and fever.  Eyes:  Negative for visual disturbance.  Respiratory:  Negative for chest tightness and shortness of breath.   Cardiovascular:  Negative for chest pain and leg swelling.  Genitourinary:  Negative for difficulty urinating and dysuria.  Musculoskeletal:  Negative for back pain and gait problem.  Skin:  Negative for rash.  Neurological:  Negative for light-headedness and headaches.  Psychiatric/Behavioral:  Positive for decreased concentration and dysphoric mood. Negative for agitation, behavioral problems, sleep disturbance and suicidal ideas. The patient is nervous/anxious.   All other systems reviewed and are negative.   Per HPI unless specifically indicated above   Allergies as of 10/23/2022       Reactions   Aspirin Hives   Gabapentin Other (See Comments)   Dizzy,falls        Medication List        Accurate as of October 23, 2022  3:49 PM. If you  have any questions, ask your nurse or doctor.          STOP taking these medications    methocarbamol 500 MG tablet Commonly known as: ROBAXIN Stopped by: Elige Radon Aubree Doody   tolterodine 2 MG 24 hr capsule Commonly known as: Detrol LA Stopped by: Elige Radon Edythe Riches       TAKE these medications    albuterol 108 (90 Base) MCG/ACT inhaler Commonly known as: VENTOLIN HFA 2 puffs every 6 hours as needed for wheezing or shortness of breath   alendronate 70 MG tablet Commonly known as: FOSAMAX Take 1 tablet (70 mg total) by mouth every 7 (seven) days. Take with a full glass of water on an empty stomach.   amitriptyline  100 MG tablet Commonly known as: ELAVIL Take 1 tablet (100 mg total) by mouth at bedtime.   atorvastatin 40 MG tablet Commonly known as: LIPITOR Take 1 tablet (40 mg total) by mouth daily.   calcium carbonate 1500 (600 Ca) MG Tabs tablet Commonly known as: OSCAL Take by mouth 2 (two) times daily with a meal.   clopidogrel 75 MG tablet Commonly known as: PLAVIX Take 1 tablet (75 mg total) by mouth daily.   donepezil 10 MG tablet Commonly known as: ARICEPT Take 1 tablet (10 mg total) by mouth at bedtime. Started by: Elige Radon Celeste Tavenner   DULoxetine 60 MG capsule Commonly known as: Cymbalta Take 1 capsule (60 mg total) by mouth daily.   levothyroxine 125 MCG tablet Commonly known as: SYNTHROID Take 1 tablet (125 mcg total) by mouth daily before breakfast.   lisinopril 40 MG tablet Commonly known as: ZESTRIL Take 1 tablet (40 mg total) by mouth daily.   traMADol 50 MG tablet Commonly known as: ULTRAM Take 1 tablet (50 mg total) by mouth every 6 (six) hours as needed.   triamcinolone cream 0.1 % Commonly known as: KENALOG Apply 1 application topically 2 (two) times daily.         Objective:   BP 110/62   Pulse 89   Temp (!) 96.9 F (36.1 C) (Temporal)   Ht 5\' 6"  (1.676 m)   Wt 181 lb 3.2 oz (82.2 kg)   SpO2 93%   BMI 29.25 kg/m   Wt Readings from Last 3 Encounters:  10/23/22 181 lb 3.2 oz (82.2 kg)  08/25/22 185 lb (83.9 kg)  08/22/22 180 lb (81.6 kg)    Physical Exam Vitals and nursing note reviewed.  Constitutional:      General: She is not in acute distress.    Appearance: She is well-developed. She is not diaphoretic.  Eyes:     Conjunctiva/sclera: Conjunctivae normal.     Pupils: Pupils are equal, round, and reactive to light.  Cardiovascular:     Rate and Rhythm: Normal rate and regular rhythm.     Heart sounds: Normal heart sounds. No murmur heard. Pulmonary:     Effort: Pulmonary effort is normal. No respiratory distress.     Breath sounds:  Normal breath sounds. No wheezing.  Musculoskeletal:        General: No tenderness. Normal range of motion.  Skin:    General: Skin is warm and dry.     Findings: No rash.  Neurological:     Mental Status: She is alert and oriented to person, place, and time.     Coordination: Coordination normal.  Psychiatric:        Mood and Affect: Mood is anxious and depressed.  Behavior: Behavior normal.        Thought Content: Thought content does not include suicidal ideation. Thought content does not include suicidal plan.        Cognition and Memory: She exhibits impaired recent memory.       Assessment & Plan:   Problem List Items Addressed This Visit       Cardiovascular and Mediastinum   Hypertension associated with diabetes (HCC) - Primary   Relevant Medications   atorvastatin (LIPITOR) 40 MG tablet   lisinopril (ZESTRIL) 40 MG tablet     Endocrine   DM (diabetes mellitus) (HCC)   Relevant Medications   atorvastatin (LIPITOR) 40 MG tablet   lisinopril (ZESTRIL) 40 MG tablet   Other Relevant Orders   Bayer DCA Hb A1c Waived   Lipid panel   Hyperlipidemia associated with type 2 diabetes mellitus (HCC)   Relevant Medications   atorvastatin (LIPITOR) 40 MG tablet   lisinopril (ZESTRIL) 40 MG tablet   Other Relevant Orders   Bayer DCA Hb A1c Waived   CBC with Differential/Platelet   CMP14+EGFR   Lipid panel   Diabetic peripheral neuropathy associated with type 2 diabetes mellitus (HCC)   Relevant Medications   amitriptyline (ELAVIL) 100 MG tablet   atorvastatin (LIPITOR) 40 MG tablet   DULoxetine (CYMBALTA) 60 MG capsule   lisinopril (ZESTRIL) 40 MG tablet   donepezil (ARICEPT) 10 MG tablet   Other Relevant Orders   Bayer DCA Hb A1c Waived   CBC with Differential/Platelet   CMP14+EGFR   Lipid panel     Musculoskeletal and Integument   Lumbar compression fracture (HCC)   Relevant Medications   alendronate (FOSAMAX) 70 MG tablet   Other Visit Diagnoses      Mild intermittent reactive airway disease without complication       Relevant Medications   albuterol (VENTOLIN HFA) 108 (90 Base) MCG/ACT inhaler   Lumbar pain       Relevant Medications   traMADol (ULTRAM) 50 MG tablet   Memory deficit       Relevant Medications   donepezil (ARICEPT) 10 MG tablet     Will start donepezil to see if it helps with memory issues with age.  Will do blood work today testing for diabetes and cholesterol and her usual medicines.  No changes today  Follow up plan: Return in about 3 months (around 01/23/2023), or if symptoms worsen or fail to improve, for diabetes and htn and hld.  Counseling provided for all of the vaccine components Orders Placed This Encounter  Procedures   Bayer DCA Hb A1c Waived   CBC with Differential/Platelet   CMP14+EGFR   Lipid panel    Arville Care, MD [facility] 10/23/2022, 3:49 PM

## 2022-10-24 ENCOUNTER — Other Ambulatory Visit: Payer: Self-pay | Admitting: Nurse Practitioner

## 2022-10-24 DIAGNOSIS — R413 Other amnesia: Secondary | ICD-10-CM

## 2022-10-24 MED ORDER — DONEPEZIL HCL 10 MG PO TABS
10.0000 mg | ORAL_TABLET | Freq: Every day | ORAL | 0 refills | Status: DC
Start: 2022-10-24 — End: 2022-10-24

## 2022-10-24 MED ORDER — DONEPEZIL HCL 10 MG PO TABS
10.0000 mg | ORAL_TABLET | Freq: Every day | ORAL | 0 refills | Status: DC
Start: 2022-10-24 — End: 2023-01-18

## 2022-10-25 ENCOUNTER — Encounter: Payer: Self-pay | Admitting: Family Medicine

## 2022-10-25 NOTE — Telephone Encounter (Signed)
Lynden Ang,  Do you have any paperwork on Ms. Nason?

## 2022-11-06 ENCOUNTER — Ambulatory Visit: Payer: Self-pay | Admitting: *Deleted

## 2022-11-06 NOTE — Patient Outreach (Signed)
Care Coordination   Follow Up Visit Note   11/06/2022 Name: Laura Dalton MRN: [id number] DOB: [birth year]  Laura Dalton is a 72 y.o. year old female who sees Dettinger, Elige Radon, MD for primary care. I spoke with  Laura Dalton by phone today.  What matters to the patients health and wellness today?  Doing well per patient. Reports she is getting out of the home and still using her walker. "I'm improving"   She reports she stayed with her Daughter for 7 weeks after her fall but is able to stay home by herself now  She reports her finger is not hurting and healing well  Physical therapy- Laura Dalton reports she has not received a call from home health services. She reports she was informed that a "Aram Beecham called" but states this is not true. She confirms a female to visited her once and did not return. Voiced understanding of the home health initial intake visit, the differences in home health and outpatient therapy. She voices understanding that if she is not home bound she is eligible to be provided with an order for outpatient therapy services.   Laura Dalton confirmed with RN CM that she is NOT preferring home health nor outpatient therapy services. She reports having but types of services in the past and does not feel they were beneficial. Prefers to use an online chair exercise program that her daughter assisted her with.   She voiced concern about her brother Laura Dalton, [place] Fritch) who she reports is having continuous diarrhea after eating and has loss a lot of weight recently. She voiced appreciation for being allowed to ventilate her feelings and having questions answered. She confirms she will have a conversation with her brother to get permission to assist him with an outreach to his pcp   Goals Addressed               This Visit's Progress     Patient Stated     [facility] care coordination services (pt-stated)   On track     Interventions Today    Flowsheet Row Most  Recent Value  Chronic Disease   Chronic disease during today's visit --  [fractured finger, mobility, therapy sessions, concern for brother]  General Interventions   General Interventions Discussed/Reviewed General Interventions Reviewed, Durable Medical Equipment (DME), Doctor Visits, Communication with, Community Resources  Doctor Visits Discussed/Reviewed Doctor Visits Reviewed, PCP  Durable Medical Equipment (DME) Dan Humphreys  PCP/Specialist Visits Compliance with follow-up visit  Communication with PCP/Specialists  [update pcp on patient preference for therapies]  Exercise Interventions   Exercise Discussed/Reviewed Exercise Reviewed, Physical Activity, Assistive device use and maintanence  Physical Activity Discussed/Reviewed Physical Activity Reviewed, Types of exercise, Home Exercise Program (HEP)  [confirmed patient Not preferring home health nor outpatient therapy  Prefers to use tv services chair exercises via website]  Education Interventions   Education Provided Provided Education, Provided Web-based Education  [importance of therapy, home health, outpatient therapy, home bound for home health, diarrhea, HIPAA, importance for follow up with brother's pcp]  Provided Verbal Education On Nutrition, Mental Health/Coping with Illness, Exercise, When to see the doctor, Community Resources  Mental Health Interventions   Mental Health Discussed/Reviewed Mental Health Reviewed, Coping Strategies  Nutrition Interventions   Nutrition Discussed/Reviewed Nutrition Reviewed, Fluid intake, Increasing proteins  Pharmacy Interventions   Pharmacy Dicussed/Reviewed Pharmacy Topics Reviewed, Medications and their functions  Safety Interventions   Safety Discussed/Reviewed Safety Reviewed, Fall Risk  SDOH assessments and interventions completed:  No     Care Coordination Interventions:  Yes, provided   Follow up plan: Follow up call scheduled for 12/06/22    Encounter Outcome:   Pt. Visit Completed   Malisha Mabey L. Noelle Penner, RN, BSN, CCM [facility] Care Management Community Coordinator Office number [phone]

## 2022-11-06 NOTE — Patient Instructions (Addendum)
Visit Information  Thank you for taking time to visit with me today. Please don't hesitate to contact me if I can be of assistance to you.   Following are the goals we discussed today:   Goals Addressed               This Visit's Progress     Patient Stated     [facility] care coordination services (pt-stated)   On track     Interventions Today    Flowsheet Row Most Recent Value  Chronic Disease   Chronic disease during today's visit --  [fractured finger, mobility, therapy sessions, concern for brother]  General Interventions   General Interventions Discussed/Reviewed General Interventions Reviewed, Durable Medical Equipment (DME), Doctor Visits, Communication with, Community Resources  Doctor Visits Discussed/Reviewed Doctor Visits Reviewed, PCP  Durable Medical Equipment (DME) Dan Humphreys  PCP/Specialist Visits Compliance with follow-up visit  Communication with PCP/Specialists  [update pcp on patient preference for therapies]  Exercise Interventions   Exercise Discussed/Reviewed Exercise Reviewed, Physical Activity, Assistive device use and maintanence  Physical Activity Discussed/Reviewed Physical Activity Reviewed, Types of exercise, Home Exercise Program (HEP)  [confirmed patient Not preferring home health nor outpatient therapy  Prefers to use tv services chair exercises via website]  Education Interventions   Education Provided Provided Education, Provided Web-based Education  [importance of therapy, home health, outpatient therapy, home bound for home health, diarrhea, HIPAA, importance for follow up with brother's pcp]  Provided Verbal Education On Nutrition, Mental Health/Coping with Illness, Exercise, When to see the doctor, Community Resources  Mental Health Interventions   Mental Health Discussed/Reviewed Mental Health Reviewed, Coping Strategies  Nutrition Interventions   Nutrition Discussed/Reviewed Nutrition Reviewed, Fluid intake, Increasing proteins  Pharmacy  Interventions   Pharmacy Dicussed/Reviewed Pharmacy Topics Reviewed, Medications and their functions  Safety Interventions   Safety Discussed/Reviewed Safety Reviewed, Fall Risk             Our next appointment is by telephone on 12/06/22 at 1000  Please call the care guide team at [phone] if you need to cancel or reschedule your appointment.   If you are experiencing a Mental Health or Behavioral Health Crisis or need someone to talk to, please call the Suicide and Crisis Lifeline: 988 call the [place] National Suicide Prevention Lifeline: (732) 693-5408 or TTY: 709-264-5488 TTY (601)560-7340) to talk to a trained counselor call 1-800-273-TALK (toll free, 24 hour hotline) call the [facility]: [phone] call 911   Patient verbalizes understanding of instructions and care plan provided today and agrees to view in MyChart. Active MyChart status and patient understanding of how to access instructions and care plan via MyChart confirmed with patient.     The patient has been provided with contact information for the care management team and has been advised to call with any health related questions or concerns.    Oleta Gunnoe L. Noelle Penner, RN, BSN, CCM [facility] Care Management Community Coordinator Office number [phone]

## 2022-11-10 ENCOUNTER — Other Ambulatory Visit: Payer: Self-pay | Admitting: Family Medicine

## 2022-11-10 DIAGNOSIS — E1169 Type 2 diabetes mellitus with other specified complication: Secondary | ICD-10-CM

## 2022-11-10 DIAGNOSIS — E1142 Type 2 diabetes mellitus with diabetic polyneuropathy: Secondary | ICD-10-CM

## 2022-12-01 ENCOUNTER — Other Ambulatory Visit: Payer: Self-pay | Admitting: Family Medicine

## 2022-12-01 DIAGNOSIS — S32010A Wedge compression fracture of first lumbar vertebra, initial encounter for closed fracture: Secondary | ICD-10-CM

## 2022-12-06 ENCOUNTER — Ambulatory Visit: Payer: Self-pay | Admitting: *Deleted

## 2022-12-06 NOTE — Patient Outreach (Signed)
Care Coordination   Follow Up Visit Note   12/06/2022 Name: Laura Dalton MRN: [id number] DOB: 10/04/1950  Raelani Niwa is a 72 y.o. year old female who sees Dettinger, Elige Radon, MD for primary care. I spoke with  Gearldine Bienenstock, daughter of Laron Pique by phone today.  What matters to the patients health and wellness today?  Dementia- Gearldine Bienenstock shares that the patient is now preferring all medical staff call her vs [place].  Gearldine Bienenstock does agree to be called if it is and emergency or patient can not be reached.  Updated in [facility] demographics      Goals Addressed   None     SDOH assessments and interventions completed:  No     Care Coordination Interventions:  No, not indicated   Follow up plan: Follow up call scheduled for pending patient preferred date     Encounter Outcome:  Patient Visit Completed   Katheline Brendlinger L. Noelle Penner, RN, BSN, CCM, Care Management Coordinator [phone]

## 2022-12-06 NOTE — Patient Outreach (Signed)
Care Coordination   12/06/2022 Name: Laura Dalton MRN: [id number] DOB: 20-Apr-1950   Care Coordination Outreach Attempts:  An unsuccessful telephone outreach was attempted for a scheduled appointment today.  Follow Up Plan:  Additional outreach attempts will be made to offer the patient care coordination information and services.   Encounter Outcome:  No Answer   Care Coordination Interventions:  No, not indicated     Latrisha Coiro L. Noelle Penner, RN, BSN, CCM, Care Management Coordinator [phone]

## 2022-12-11 ENCOUNTER — Emergency Department (HOSPITAL_COMMUNITY): Payer: Medicare Other

## 2022-12-11 ENCOUNTER — Emergency Department (HOSPITAL_COMMUNITY)
Admission: EM | Admit: 2022-12-11 | Discharge: 2022-12-11 | Disposition: A | Discharge status: Home / Self Care | Payer: Medicare Other | Attending: Emergency Medicine | Admitting: Emergency Medicine

## 2022-12-11 ENCOUNTER — Other Ambulatory Visit: Payer: Self-pay

## 2022-12-11 ENCOUNTER — Encounter (HOSPITAL_COMMUNITY): Payer: Self-pay | Admitting: Emergency Medicine

## 2022-12-11 DIAGNOSIS — Z043 Encounter for examination and observation following other accident: Secondary | ICD-10-CM | POA: Diagnosis not present

## 2022-12-11 DIAGNOSIS — N3289 Other specified disorders of bladder: Secondary | ICD-10-CM | POA: Diagnosis not present

## 2022-12-11 DIAGNOSIS — S7002XA Contusion of left hip, initial encounter: Secondary | ICD-10-CM | POA: Diagnosis not present

## 2022-12-11 DIAGNOSIS — M47812 Spondylosis without myelopathy or radiculopathy, cervical region: Secondary | ICD-10-CM | POA: Diagnosis not present

## 2022-12-11 DIAGNOSIS — K5790 Diverticulosis of intestine, part unspecified, without perforation or abscess without bleeding: Secondary | ICD-10-CM | POA: Diagnosis not present

## 2022-12-11 DIAGNOSIS — R059 Cough, unspecified: Secondary | ICD-10-CM | POA: Diagnosis not present

## 2022-12-11 DIAGNOSIS — M25552 Pain in left hip: Secondary | ICD-10-CM | POA: Insufficient documentation

## 2022-12-11 DIAGNOSIS — I6782 Cerebral ischemia: Secondary | ICD-10-CM | POA: Diagnosis not present

## 2022-12-11 DIAGNOSIS — Z8673 Personal history of transient ischemic attack (TIA), and cerebral infarction without residual deficits: Secondary | ICD-10-CM | POA: Diagnosis not present

## 2022-12-11 DIAGNOSIS — R9089 Other abnormal findings on diagnostic imaging of central nervous system: Secondary | ICD-10-CM | POA: Diagnosis not present

## 2022-12-11 DIAGNOSIS — Z7901 Long term (current) use of anticoagulants: Secondary | ICD-10-CM | POA: Insufficient documentation

## 2022-12-11 DIAGNOSIS — M549 Dorsalgia, unspecified: Secondary | ICD-10-CM | POA: Diagnosis not present

## 2022-12-11 DIAGNOSIS — M419 Scoliosis, unspecified: Secondary | ICD-10-CM | POA: Diagnosis not present

## 2022-12-11 DIAGNOSIS — Z1152 Encounter for screening for COVID-19: Secondary | ICD-10-CM | POA: Insufficient documentation

## 2022-12-11 DIAGNOSIS — W19XXXA Unspecified fall, initial encounter: Secondary | ICD-10-CM | POA: Diagnosis not present

## 2022-12-11 DIAGNOSIS — M4802 Spinal stenosis, cervical region: Secondary | ICD-10-CM | POA: Diagnosis not present

## 2022-12-11 DIAGNOSIS — M47814 Spondylosis without myelopathy or radiculopathy, thoracic region: Secondary | ICD-10-CM | POA: Diagnosis not present

## 2022-12-11 DIAGNOSIS — M5136 Other intervertebral disc degeneration, lumbar region: Secondary | ICD-10-CM | POA: Diagnosis not present

## 2022-12-11 DIAGNOSIS — I6529 Occlusion and stenosis of unspecified carotid artery: Secondary | ICD-10-CM | POA: Diagnosis not present

## 2022-12-11 LAB — BASIC METABOLIC PANEL
Anion gap: 12 (ref 5–15)
BUN: 9 mg/dL (ref 8–23)
CO2: 25 mmol/L (ref 22–32)
Calcium: 9.5 mg/dL (ref 8.9–10.3)
Chloride: 97 mmol/L — ABNORMAL LOW (ref 98–111)
Creatinine, Ser: 0.79 mg/dL (ref 0.44–1.00)
GFR, Estimated: >60 mL/min (ref >60)
Glucose, Bld: 138 mg/dL — ABNORMAL HIGH (ref 70–99)
Potassium: 3.6 mmol/L (ref 3.5–5.1)
Sodium: 134 mmol/L — ABNORMAL LOW (ref 135–145)

## 2022-12-11 LAB — CBC WITH DIFFERENTIAL/PLATELET
Abs Immature Granulocytes: 0.04 10*3/uL (ref 0.00–0.07)
Basophils Absolute: 0 10*3/uL (ref 0.0–0.1)
Basophils Relative: 0 %
Eosinophils Absolute: 0.1 10*3/uL (ref 0.0–0.5)
Eosinophils Relative: 1 %
HCT: 38.9 % (ref 36.0–46.0)
Hemoglobin: 12.5 g/dL (ref 12.0–15.0)
Immature Granulocytes: 0 %
Lymphocytes Relative: 6 %
Lymphs Abs: 0.7 10*3/uL (ref 0.7–4.0)
MCH: 27.4 pg (ref 26.0–34.0)
MCHC: 32.1 g/dL (ref 30.0–36.0)
MCV: 85.1 fL (ref 80.0–100.0)
Monocytes Absolute: 1.2 10*3/uL — ABNORMAL HIGH (ref 0.1–1.0)
Monocytes Relative: 9 %
Neutro Abs: 10.9 10*3/uL — ABNORMAL HIGH (ref 1.7–7.7)
Neutrophils Relative %: 84 %
Platelets: 317 10*3/uL (ref 150–400)
RBC: 4.57 MIL/uL (ref 3.87–5.11)
RDW: 13.1 % (ref 11.5–15.5)
WBC: 12.9 10*3/uL — ABNORMAL HIGH (ref 4.0–10.5)
nRBC: 0 % (ref 0.0–0.2)

## 2022-12-11 LAB — URINALYSIS, ROUTINE W REFLEX MICROSCOPIC
Bilirubin Urine: NEGATIVE
Glucose, UA: NEGATIVE mg/dL
Ketones, ur: NEGATIVE mg/dL
Leukocytes,Ua: NEGATIVE
Nitrite: NEGATIVE
Protein, ur: NEGATIVE mg/dL
Specific Gravity, Urine: 1.01 (ref 1.005–1.030)
Trans Epithel, UA: 1
pH: 6 (ref 5.0–8.0)

## 2022-12-11 LAB — SARS CORONAVIRUS 2 BY RT PCR: SARS Coronavirus 2 by RT PCR: NEGATIVE

## 2022-12-11 LAB — CK: Total CK: 552 U/L — ABNORMAL HIGH (ref 38–234)

## 2022-12-11 MED ORDER — ACETAMINOPHEN 325 MG PO TABS
650.0000 mg | ORAL_TABLET | Freq: Once | ORAL | Status: AC
  Administered 2022-12-11: 650 mg via ORAL
  Filled 2022-12-11: qty 2

## 2022-12-11 MED ORDER — LACTATED RINGERS IV BOLUS
500.0000 mL | Freq: Once | INTRAVENOUS | Status: AC
  Administered 2022-12-11: 500 mL via INTRAVENOUS

## 2022-12-11 NOTE — ED Notes (Signed)
Medicated per MAR Pt off to MRI

## 2022-12-11 NOTE — ED Provider Notes (Signed)
Handoff from [place], [place], patient complains of left hip weakness unable to walk, typically walks with walker, unable to today.  Is alone, mildly demented.  Plan is to get an MRI brain, CT head to evaluate for fracture, versus stroke. Physical Exam  BP (!) 154/62   Pulse 84   Temp 97.9 F (36.6 C) (Rectal)   Resp 16   Ht 5\' 6"  (1.676 m)   Wt 81.6 kg   SpO2 97%   BMI 29.05 kg/m   Physical Exam Vitals and nursing note reviewed.  Constitutional:      General: She is not in acute distress.    Appearance: She is well-developed.  HENT:     Head: Normocephalic and atraumatic.  Eyes:     Conjunctiva/sclera: Conjunctivae normal.  Cardiovascular:     Rate and Rhythm: Normal rate and regular rhythm.     Heart sounds: No murmur heard. Pulmonary:     Effort: Pulmonary effort is normal. No respiratory distress.     Breath sounds: Normal breath sounds.  Abdominal:     Palpations: Abdomen is soft.     Tenderness: There is no abdominal tenderness.  Musculoskeletal:        General: No swelling.     Cervical back: Neck supple.  Skin:    General: Skin is warm and dry.     Capillary Refill: Capillary refill takes less than 2 seconds.  Neurological:     Mental Status: She is alert.  Psychiatric:        Mood and Affect: Mood normal.     Procedures  Procedures  ED Course / MDM    Medical Decision Making Amount and/or Complexity of Data Reviewed Labs: ordered. Radiology: ordered.  Risk OTC drugs.   Patient has good strength bilateral lower extremities, no focal deficit on my exam.  Overall well-appearing.  CT of the pelvis, unremarkable, no evidence of any kind of fracture.  MRI is negative as well for CVA that is acute.  I discussed this with her daughter, at the bedside, I stated that the patient cannot be left alone, and will need further supervision.  I placed a TOC consult, to help them outpatient, and advised the daughter that the patient will need to be supervised 24/7.   Recommended follow-up with PCP, and discussed return precautions   Pete Pelt, PA 12/11/22 2239    Vanetta Mulders, MD 12/12/22 [phone]

## 2022-12-11 NOTE — ED Notes (Signed)
Pt resting comfortably at this time Daughter at bedside Informed that MRI results are still pending

## 2022-12-11 NOTE — ED Triage Notes (Addendum)
Pt BIB DIL for fall, per DIL, pt leaves alone and looking at camera pt fall between 12-6 AM. Pt states "someone is in the house", c/o of buttocks, back, and right arm pain, history of early dementia.

## 2022-12-11 NOTE — Discharge Instructions (Addendum)
There is no evidence of a stroke, that is new, on the MRI, or fracture of her hip.  The cause of her repeated falls, is unclear.  She will need to follow-up with her primary care doctor, and possibly get physical therapy.  Return to the ER if you feel like symptoms are worsening.  The patient should not be left alone, unsupervised.

## 2022-12-11 NOTE — ED Provider Notes (Signed)
[facility] AT [facility] Provider Note   CSN: [id number] Arrival date & time: 12/11/22  1219     History  Chief Complaint  Patient presents with   Marvell Fuller Jaila Schellhorn is a 72 y.o. female.[name] , a 72 y.o. female  was evaluated in triage.  Pt complains of low back pain, left hip pain.  She member provides most the history, states patient lives alone and has "beginning of dementia".  She was her normal self yesterday but when they went to see her today she had fallen, patient does not remember falling.  They are not sure how long she laid on the ground.  She also seems to be confused.  Patient is on Plavix.    Fall       Home Medications Prior to Admission medications   Medication Sig Start Date End Date Taking? Authorizing Provider  albuterol (VENTOLIN HFA) 108 (90 Base) MCG/ACT inhaler 2 puffs every 6 hours as needed for wheezing or shortness of breath 10/23/22   Dettinger, Elige Radon, MD  alendronate (FOSAMAX) 70 MG tablet TAKE 1 TABLET BY MOUTH WEEKLY  WITH 8 OZ OF PLAIN WATER 30  MINUTES BEFORE FIRST FOOD, DRINK OR MEDS. STAY UPRIGHT FOR 30  MINS 12/01/22   Dettinger, Elige Radon, MD  amitriptyline (ELAVIL) 100 MG tablet TAKE 1 TABLET BY MOUTH AT  BEDTIME 11/14/22   Dettinger, Elige Radon, MD  atorvastatin (LIPITOR) 40 MG tablet TAKE 1 TABLET BY MOUTH DAILY 11/14/22   Dettinger, Elige Radon, MD  calcium carbonate (OSCAL) 1500 (600 Ca) MG TABS tablet Take by mouth 2 (two) times daily with a meal.    [provider]  clopidogrel (PLAVIX) 75 MG tablet TAKE 1 TABLET BY MOUTH DAILY 11/14/22   Dettinger, Elige Radon, MD  donepezil (ARICEPT) 10 MG tablet Take 1 tablet (10 mg total) by mouth at bedtime. 10/24/22   St Vena Austria, NP  DULoxetine (CYMBALTA) 60 MG capsule TAKE 1 CAPSULE BY MOUTH DAILY 11/14/22   Dettinger, Elige Radon, MD  levothyroxine (SYNTHROID) 125 MCG tablet TAKE 1 TABLET BY MOUTH DAILY  BEFORE BREAKFAST 11/14/22   Dettinger,  Elige Radon, MD  lisinopril (ZESTRIL) 40 MG tablet Take 1 tablet (40 mg total) by mouth daily. 10/23/22   Dettinger, Elige Radon, MD  traMADol (ULTRAM) 50 MG tablet Take 1 tablet (50 mg total) by mouth every 6 (six) hours as needed. 10/23/22   Dettinger, Elige Radon, MD  triamcinolone cream (KENALOG) 0.1 % Apply 1 application topically 2 (two) times daily. 08/01/18   Dettinger, Elige Radon, MD      Allergies    Aspirin and Gabapentin    Review of Systems   Review of Systems  Physical Exam Updated Vital Signs BP (!) 154/62   Pulse 84   Temp 97.9 F (36.6 C) (Rectal)   Resp 16   Ht 5\' 6"  (1.676 m)   Wt 81.6 kg   SpO2 97%   BMI 29.05 kg/m  Physical Exam Vitals and nursing note reviewed.  Constitutional:      General: She is not in acute distress.    Appearance: She is well-developed.  HENT:     Head: Normocephalic and atraumatic.     Mouth/Throat:     Mouth: Mucous membranes are moist.  Eyes:     Extraocular Movements: Extraocular movements intact.     Conjunctiva/sclera: Conjunctivae normal.     Pupils: Pupils are equal, round, and  reactive to light.  Cardiovascular:     Rate and Rhythm: Normal rate and regular rhythm.     Heart sounds: No murmur heard. Pulmonary:     Effort: Pulmonary effort is normal. No respiratory distress.     Breath sounds: Normal breath sounds.  Abdominal:     Palpations: Abdomen is soft.     Tenderness: There is no abdominal tenderness.  Musculoskeletal:        General: No swelling. Normal range of motion.     Cervical back: Neck supple.  Skin:    General: Skin is warm and dry.     Capillary Refill: Capillary refill takes less than 2 seconds.  Neurological:     General: No focal deficit present.     Mental Status: She is alert and oriented to person, place, and time.  Psychiatric:        Mood and Affect: Mood normal.     ED Results / Procedures / Treatments   Labs (all labs ordered are listed, but only abnormal results are displayed) Labs  Reviewed  CBC WITH DIFFERENTIAL/PLATELET - Abnormal; Notable for the following components:      Result Value   WBC 12.9 (*)    Neutro Abs 10.9 (*)    Monocytes Absolute 1.2 (*)    All other components within normal limits  BASIC METABOLIC PANEL - Abnormal; Notable for the following components:   Sodium 134 (*)    Chloride 97 (*)    Glucose, Bld 138 (*)    All other components within normal limits  URINALYSIS, ROUTINE W REFLEX MICROSCOPIC - Abnormal; Notable for the following components:   Hgb urine dipstick SMALL (*)    Bacteria, UA RARE (*)    All other components within normal limits  CK - Abnormal; Notable for the following components:   Total CK 552 (*)    All other components within normal limits  SARS CORONAVIRUS 2 BY RT PCR    EKG None  Radiology CT Head Wo Contrast  Result Date: 12/11/2022 CLINICAL DATA:  Fall back and arm pain history of dementia EXAM: CT HEAD WITHOUT CONTRAST CT CERVICAL SPINE WITHOUT CONTRAST TECHNIQUE: Multidetector CT imaging of the head and cervical spine was performed following the standard protocol without intravenous contrast. Multiplanar CT image reconstructions of the cervical spine were also generated. RADIATION DOSE REDUCTION: This exam was performed according to the departmental dose-optimization program which includes automated exposure control, adjustment of the mA and/or kV according to patient size and/or use of iterative reconstruction technique. COMPARISON:  CT brain and cervical spine 08/12/2022 FINDINGS: CT HEAD FINDINGS Brain: No acute territorial infarction, hemorrhage or intracranial mass. Patchy white matter hypodensity consistent with chronic small vessel ischemic change. Mild atrophy. Stable ventricle size. Vascular: No hyperdense vessels.  Carotid vascular calcification Skull: Normal. Negative for fracture or focal lesion. Sinuses/Orbits: No acute finding. Other: None CT CERVICAL SPINE FINDINGS Alignment: Mild reversal of cervical  lordosis. Facet alignment is within normal limits. Skull base and vertebrae: No acute fracture. No primary bone lesion or focal pathologic process. Soft tissues and spinal canal: No prevertebral fluid or swelling. No visible canal hematoma. Disc levels: Multilevel degenerative changes. Moderate disc space narrowing C2-C3, C4-C5 with advanced disc space narrowing C5-C6 and C6-C7. Facet degenerative changes at multiple levels with foraminal narrowing. Upper chest: Apical emphysema.  Mild scarring right apex Other: None IMPRESSION: 1. No CT evidence for acute intracranial abnormality. Atrophy and chronic small vessel ischemic changes of the white matter. 2.  Reversal of cervical lordosis with multilevel degenerative changes. No acute osseous abnormality. Electronically Signed   By: Jasmine Pang M.D.   On: 12/11/2022 17:59   CT Cervical Spine Wo Contrast  Result Date: 12/11/2022 CLINICAL DATA:  Fall back and arm pain history of dementia EXAM: CT HEAD WITHOUT CONTRAST CT CERVICAL SPINE WITHOUT CONTRAST TECHNIQUE: Multidetector CT imaging of the head and cervical spine was performed following the standard protocol without intravenous contrast. Multiplanar CT image reconstructions of the cervical spine were also generated. RADIATION DOSE REDUCTION: This exam was performed according to the departmental dose-optimization program which includes automated exposure control, adjustment of the mA and/or kV according to patient size and/or use of iterative reconstruction technique. COMPARISON:  CT brain and cervical spine 08/12/2022 FINDINGS: CT HEAD FINDINGS Brain: No acute territorial infarction, hemorrhage or intracranial mass. Patchy white matter hypodensity consistent with chronic small vessel ischemic change. Mild atrophy. Stable ventricle size. Vascular: No hyperdense vessels.  Carotid vascular calcification Skull: Normal. Negative for fracture or focal lesion. Sinuses/Orbits: No acute finding. Other: None CT CERVICAL  SPINE FINDINGS Alignment: Mild reversal of cervical lordosis. Facet alignment is within normal limits. Skull base and vertebrae: No acute fracture. No primary bone lesion or focal pathologic process. Soft tissues and spinal canal: No prevertebral fluid or swelling. No visible canal hematoma. Disc levels: Multilevel degenerative changes. Moderate disc space narrowing C2-C3, C4-C5 with advanced disc space narrowing C5-C6 and C6-C7. Facet degenerative changes at multiple levels with foraminal narrowing. Upper chest: Apical emphysema.  Mild scarring right apex Other: None IMPRESSION: 1. No CT evidence for acute intracranial abnormality. Atrophy and chronic small vessel ischemic changes of the white matter. 2. Reversal of cervical lordosis with multilevel degenerative changes. No acute osseous abnormality. Electronically Signed   By: Jasmine Pang M.D.   On: 12/11/2022 17:59   DG Lumbar Spine Complete  Result Date: 12/11/2022 CLINICAL DATA:  Low back pain after fall. EXAM: LUMBAR SPINE - COMPLETE 4+ VIEW COMPARISON:  None available. FINDINGS: Five non-rib-bearing lumbar vertebra. Mild broad-based levo scoliotic curvature. No evidence of fracture or compression deformity. Moderate diffuse degenerative disc disease. Mild multilevel facet hypertrophy. Sacroiliac joints are congruent. IMPRESSION: 1. No fracture or subluxation of the lumbar spine. 2. Moderate diffuse degenerative disc disease and mild facet hypertrophy. Electronically Signed   By: Narda Rutherford M.D.   On: 12/11/2022 17:25   DG Thoracic Spine 2 View  Result Date: 12/11/2022 CLINICAL DATA:  Back pain after fall. EXAM: THORACIC SPINE 2 VIEWS COMPARISON:  Thoracic spine MRI 09/21/2022, radiograph 08/12/2022 FINDINGS: The bones are subjectively under mineralized. No evidence of acute fracture. Stable concavity of a midthoracic vertebra without fracture on intervening MRI. Postsurgical change at T10 with resection of the pedicle, additional postsurgical  changes were better demonstrated on prior MRI. Scoliotic curvature and multilevel degenerative change. No paravertebral soft tissue abnormalities. IMPRESSION: 1. No acute fracture of the thoracic spine. 2. Postsurgical change at T10. Electronically Signed   By: Narda Rutherford M.D.   On: 12/11/2022 17:24   DG Hip Unilat W or Wo Pelvis 2-3 Views Left  Result Date: 12/11/2022 CLINICAL DATA:  Pain after fall.  Left hip pain. EXAM: DG HIP (WITH OR WITHOUT PELVIS) 2-3V LEFT COMPARISON:  None Available. FINDINGS: No acute fracture of the pelvis or left hip. No hip dislocation. Hip joint spaces preserved. Pubic rami are intact. No diastasis of pubic symphysis or sacroiliac joints. Unremarkable soft tissues. IMPRESSION: No acute fracture of the pelvis or left hip. Electronically  Signed   By: Narda Rutherford M.D.   On: 12/11/2022 17:22   DG Chest 2 View  Result Date: 12/11/2022 CLINICAL DATA:  Cough. EXAM: CHEST - 2 VIEW COMPARISON:  Chest radiograph dated 07/14/2021. FINDINGS: No focal consolidation, pleural effusion, or pneumothorax. The cardiac silhouette is within normal is. No acute osseous pathology. Degenerative changes of the spine. IMPRESSION: No active cardiopulmonary disease. Electronically Signed   By: Elgie Collard M.D.   On: 12/11/2022 16:44    Procedures Procedures    Medications Ordered in ED Medications  acetaminophen (TYLENOL) tablet 650 mg (has no administration in time range)  lactated ringers bolus 500 mL (500 mLs Intravenous New Bag/Given 12/11/22 1840)    ED Course/ Medical Decision Making/ A&P                                 Medical Decision Making Differential diagnosis: Concussion, fracture, intracranial hemorrhage, rhabdomyolysis, other  ED course: Patient presents after fall at home with some mild confusion worse than her baseline mild dementia that started sometime in the last night last night after fall.  Normally walks a walker at home and lives alone.  No  fevers or chills.  Oral temp was 99 for so recheck temp rectally was 97.9, patient does have mild leukocytosis but no signs of infection, no UTI, no pneumonia.  X-ray T-spine, L-spine and hip are all negative for acute fracture or dislocation.  CT head and C-spine showed no acute injuries.  Family at bedside states that she is not able to walk without being carried on the left side.  Unsure if this is due to pain in the left hip or possibly patient had stroke she has no other focal neurologic deficits noted.  Last known normal was 24 hours ago..  Signed out to [employer], PA who will follow-up on MRI brain and CT pelvis  Amount and/or Complexity of Data Reviewed Labs: ordered. Radiology: ordered.  Risk OTC drugs.           Final Clinical Impression(s) / ED Diagnoses Final diagnoses:  Fall, initial encounter    Rx / DC Orders ED Discharge Orders     None         Josem Kaufmann 12/11/22 1901    Vanetta Mulders, MD 12/12/22 [phone]

## 2022-12-11 NOTE — ED Notes (Addendum)
Introduced self to pt and attached to full monitor Family stated that pt has new dx of dementia Pt stated that she saw 5 people in her husbands office this morning and fell Pt denies hitting head or LOC Was on that floor on the LEFT side for several hours Bruising to LEFT arm noted, no bruising or deformities noted to the rest of the pts body  On exam, tenderness to the mid thoracic region. Tenderness to the LEFT clavicle.  LEFT knee swelling "normal" for pt" strong push and pull with feet.  No pain to pelvis or ribs. Denies HA, CP and SOB   Pt aware that UA is requested  Pt stated she has urine frequency since her back surgery 30 years ago.  Vitals listed Call bell on stretcher Daughter at bedside

## 2022-12-11 NOTE — ED Notes (Signed)
Pt on bedpan.

## 2022-12-11 NOTE — ED Notes (Signed)
Assisted pt with bedpan UA sent to lab

## 2022-12-11 NOTE — ED Provider Triage Note (Signed)
Emergency Medicine Provider Triage Evaluation Note  Laura Dalton , a 72 y.o. female  was evaluated in triage.  Pt complains of low back pain, left hip pain.  She member provides most the history, states patient lives alone and has "beginning of dementia".  She was her normal self yesterday but when they went to see her today she had fallen, patient does not remember falling.  They are not sure how long she laid on the ground.  She also seems to be confused.  Patient is on Plavix  Review of Systems  Positive: As above Negative: Fever, vomiting, numbness or tingling,  Physical Exam  BP 101/80 (BP Location: Right Arm)   Pulse 95   Temp 99.3 F (37.4 C) (Oral)   Resp 20   Ht 5\' 6"  (1.676 m)   Wt 81.6 kg   SpO2 94%   BMI 29.05 kg/m  Gen:   Awake, no distress   Resp:  Normal effort  MSK:   Moves extremities without difficulty  Other:  'Tenderness diffusely to upper and lower back, no neck pain, normocephalic atraumatic head exam  Medical Decision Making  Medically screening exam initiated at 2:15 PM.  Appropriate orders placed.  Laura Dalton was informed that the remainder of the evaluation will be completed by another provider, this initial triage assessment does not replace that evaluation, and the importance of remaining in the ED until their evaluation is complete.     [name], [place] 12/11/22 1418

## 2022-12-12 ENCOUNTER — Encounter: Payer: Self-pay | Admitting: Family Medicine

## 2022-12-12 ENCOUNTER — Encounter (INDEPENDENT_AMBULATORY_CARE_PROVIDER_SITE_OTHER): Payer: Self-pay

## 2022-12-13 ENCOUNTER — Other Ambulatory Visit: Payer: Self-pay | Admitting: Family Medicine

## 2022-12-13 ENCOUNTER — Encounter: Payer: Self-pay | Admitting: Family Medicine

## 2022-12-13 ENCOUNTER — Ambulatory Visit (INDEPENDENT_AMBULATORY_CARE_PROVIDER_SITE_OTHER): Payer: Medicare Other

## 2022-12-13 ENCOUNTER — Ambulatory Visit: Payer: Medicare Other | Admitting: Family Medicine

## 2022-12-13 VITALS — BP 116/64 | HR 97 | Temp 98.0°F | Ht 66.0 in | Wt 177.0 lb

## 2022-12-13 DIAGNOSIS — E1169 Type 2 diabetes mellitus with other specified complication: Secondary | ICD-10-CM

## 2022-12-13 DIAGNOSIS — E1142 Type 2 diabetes mellitus with diabetic polyneuropathy: Secondary | ICD-10-CM

## 2022-12-13 DIAGNOSIS — J45909 Unspecified asthma, uncomplicated: Secondary | ICD-10-CM | POA: Diagnosis not present

## 2022-12-13 DIAGNOSIS — R0600 Dyspnea, unspecified: Secondary | ICD-10-CM | POA: Diagnosis not present

## 2022-12-13 DIAGNOSIS — S62307D Unspecified fracture of fifth metacarpal bone, left hand, subsequent encounter for fracture with routine healing: Secondary | ICD-10-CM | POA: Diagnosis not present

## 2022-12-13 DIAGNOSIS — W19XXXA Unspecified fall, initial encounter: Secondary | ICD-10-CM | POA: Diagnosis not present

## 2022-12-13 DIAGNOSIS — R051 Acute cough: Secondary | ICD-10-CM

## 2022-12-13 DIAGNOSIS — S5012XA Contusion of left forearm, initial encounter: Secondary | ICD-10-CM | POA: Diagnosis not present

## 2022-12-13 DIAGNOSIS — R059 Cough, unspecified: Secondary | ICD-10-CM | POA: Diagnosis not present

## 2022-12-13 DIAGNOSIS — R2689 Other abnormalities of gait and mobility: Secondary | ICD-10-CM | POA: Diagnosis not present

## 2022-12-13 MED ORDER — AMITRIPTYLINE HCL 50 MG PO TABS
50.0000 mg | ORAL_TABLET | Freq: Every day | ORAL | 1 refills | Status: DC
Start: 2022-12-13 — End: 2022-12-28

## 2022-12-13 MED ORDER — AZITHROMYCIN 250 MG PO TABS: ORAL_TABLET | ORAL | 0 refills | Status: DC

## 2022-12-13 NOTE — Progress Notes (Signed)
Subjective:  Patient ID: Laura Dalton, female    DOB: [birth year]  Age: 72 y.o. MRN: [id number]  CC: ER FOLLOW UP (FALL)   HPI [facility] presents for falling this AM and 2-3 days ago. Checked out in E.D. 2 days ago. This morning she landed on the left arm. Has multiple contusions over the left forearm.  Message received from family member stating her dementia is worsening, She needs home health to help with meds and overall care. she is declining overall. She needs a wheelchair and PT. She takes amitriptyline for DM neuropathy. Gives her relief at night. However, she realizes meds can contribute to imbalance.     12/13/2022    2:04 PM 10/23/2022    3:01 PM 10/17/2022   10:44 AM  Depression screen PHQ 2/9  Decreased Interest 2 0 1  Down, Depressed, Hopeless 2 0 1  PHQ - 2 Score 4 0 2  Altered sleeping 0 0 1  Tired, decreased energy 2 0 1  Change in appetite 2 0 1  Feeling bad or failure about yourself  1 0 1  Trouble concentrating 0 0 1  Moving slowly or fidgety/restless 1 1 1   Suicidal thoughts 0 0 0  PHQ-9 Score 10 1 8   Difficult doing work/chores Not difficult at all Somewhat difficult Somewhat difficult    History Tanish has a past medical history of Bursitis of hip, Diabetes mellitus without complication (HCC), Hyperlipidemia, Hypertension, Hypothyroidism, Low back pain, Stroke (HCC), and Vitamin D deficiency disease.   She has a past surgical history that includes Back surgery (1996); Appendectomy; Cholecystectomy; and Leg Surgery (Right).   Her family history includes COPD in her brother, brother, sister, sister, and sister; Cancer in her brother, brother, and father; Diabetes in her mother; Hashimoto's thyroiditis in her son; Seizures in her mother; Stroke in her mother; Thyroid disease in her son.She reports that she quit smoking about 15 years ago. Her smoking use included cigarettes. She started smoking about 60 years ago. She has a 22.6 pack-year smoking history.  She has never used smokeless tobacco. She reports that she does not drink alcohol and does not use drugs.    ROS Review of Systems  Constitutional: Negative.   HENT:  Negative for congestion.   Eyes:  Visual disturbance: Macular degeneration.  Respiratory:  Positive for cough. Negative for shortness of breath.   Cardiovascular:  Negative for chest pain.  Gastrointestinal:  Negative for abdominal pain, constipation, diarrhea, nausea and vomiting.  Genitourinary:  Negative for difficulty urinating.  Musculoskeletal:  Negative for arthralgias and myalgias.  Skin:  Positive for wound (contusions left forearm).  Neurological:  Negative for headaches.  Psychiatric/Behavioral:  Negative for sleep disturbance.     Objective:  BP 116/64   Pulse 97   Temp 98 F (36.7 C)   Ht 5\' 6"  (1.676 m)   Wt 177 lb (80.3 kg)   SpO2 94%   BMI 28.57 kg/m   BP Readings from Last 3 Encounters:  12/13/22 116/64  12/11/22 (!) 158/80  10/23/22 110/62    Wt Readings from Last 3 Encounters:  12/13/22 177 lb (80.3 kg)  12/11/22 180 lb (81.6 kg)  10/23/22 181 lb 3.2 oz (82.2 kg)     Physical Exam Constitutional:      General: She is not in acute distress.    Appearance: She is well-developed.  Cardiovascular:     Rate and Rhythm: Normal rate and regular rhythm.  Pulmonary:  Breath sounds: Normal breath sounds.  Musculoskeletal:        General: Normal range of motion.  Skin:    General: Skin is warm and dry.  Neurological:     Mental Status: She is alert and oriented to person, place, and time.  Psychiatric:        Mood and Affect: Affect is blunt.        Speech: Speech is delayed.        Behavior: Behavior is cooperative.        Cognition and Memory: Cognition is impaired.       Assessment & Plan:   Chaka "Graciella Belton" was seen today for er follow up.  Diagnoses and all orders for this visit:  Contusion, forearm and elbow, left, initial encounter -     TSH -     DG Forearm  Left; Future -     Ambulatory referral to Physical Therapy -     CBC with Differential/Platelet -     Ambulatory referral to Home Health -     DME Wheelchair manual  Diabetic peripheral neuropathy associated with type 2 diabetes mellitus (HCC) -     amitriptyline (ELAVIL) 50 MG tablet; Take 1 tablet (50 mg total) by mouth at bedtime. -     Ambulatory referral to Home Health -     DME Wheelchair manual  Fall, initial encounter -     TSH -     DG Forearm Left; Future -     Ambulatory referral to Physical Therapy -     CBC with Differential/Platelet -     Ambulatory referral to Home Health -     DME Wheelchair manual  Acute cough -     CBC with Differential/Platelet -     DG Chest 2 View; Future  Imbalance -     Ambulatory referral to Home Health -     DME Wheelchair manual  Other orders -     azithromycin (ZITHROMAX Z-PAK) 250 MG tablet; Take two right away Then one a day for the next 4 days.       I have changed Hubbard Hartshorn. Calvert "Dianne"'s amitriptyline. I am also having her start on azithromycin. Additionally, I am having her maintain her calcium carbonate, triamcinolone cream, albuterol, lisinopril, traMADol, donepezil, DULoxetine, clopidogrel, atorvastatin, levothyroxine, and alendronate.  Allergies as of 12/13/2022       Reactions   Aspirin Hives   Gabapentin Other (See Comments)   Dizzy,falls        Medication List        Accurate as of December 13, 2022  9:57 PM. If you have any questions, ask your nurse or doctor.          albuterol 108 (90 Base) MCG/ACT inhaler Commonly known as: VENTOLIN HFA 2 puffs every 6 hours as needed for wheezing or shortness of breath   alendronate 70 MG tablet Commonly known as: FOSAMAX TAKE 1 TABLET BY MOUTH WEEKLY  WITH 8 OZ OF PLAIN WATER 30  MINUTES BEFORE FIRST FOOD, DRINK OR MEDS. STAY UPRIGHT FOR 30  MINS   amitriptyline 50 MG tablet Commonly known as: ELAVIL Take 1 tablet (50 mg total) by mouth at bedtime. What  changed:  medication strength how much to take Changed by: Ignazio Kincaid   atorvastatin 40 MG tablet Commonly known as: LIPITOR TAKE 1 TABLET BY MOUTH DAILY   azithromycin 250 MG tablet Commonly known as: Zithromax Z-Pak Take two right away Then one a  day for the next 4 days. Started by: Almas Rake   calcium carbonate 1500 (600 Ca) MG Tabs tablet Commonly known as: OSCAL Take by mouth 2 (two) times daily with a meal.   clopidogrel 75 MG tablet Commonly known as: PLAVIX TAKE 1 TABLET BY MOUTH DAILY   donepezil 10 MG tablet Commonly known as: ARICEPT Take 1 tablet (10 mg total) by mouth at bedtime.   DULoxetine 60 MG capsule Commonly known as: CYMBALTA TAKE 1 CAPSULE BY MOUTH DAILY   levothyroxine 125 MCG tablet Commonly known as: SYNTHROID TAKE 1 TABLET BY MOUTH DAILY  BEFORE BREAKFAST   lisinopril 40 MG tablet Commonly known as: ZESTRIL Take 1 tablet (40 mg total) by mouth daily.   traMADol 50 MG tablet Commonly known as: ULTRAM Take 1 tablet (50 mg total) by mouth every 6 (six) hours as needed.   triamcinolone cream 0.1 % Commonly known as: KENALOG Apply 1 application topically 2 (two) times daily.               Durable Medical Equipment  (From admission, onward)           Start     Ordered   12/13/22 0000  DME Wheelchair manual       Comments: Patient suffers from arthritis and poor balance which impairs their ability to perform daily activities like bathing, dressing, feeding, grooming, and toileting in the home.  A walker will not resolve issue with performing activities of daily living. A wheelchair will allow patient to safely perform daily activities. Patient can safely propel the wheelchair in the home or has a caregiver who can provide assistance. Length of need Lifetime. Accessories: elevating leg rests (ELRs), wheel locks, extensions and anti-tippers.   12/13/22 1746             Follow-up: Return in about 1 month (around  01/13/2023), or with Dr. Louanne Skye.  Mechele Claude, M.D.

## 2022-12-14 ENCOUNTER — Encounter: Payer: Self-pay | Admitting: Family Medicine

## 2022-12-15 MED ORDER — AZITHROMYCIN 250 MG PO TABS: ORAL_TABLET | ORAL | 0 refills | Status: AC

## 2022-12-18 ENCOUNTER — Encounter: Payer: Self-pay | Admitting: Family Medicine

## 2022-12-28 ENCOUNTER — Other Ambulatory Visit: Payer: Self-pay | Admitting: Family Medicine

## 2022-12-28 DIAGNOSIS — Z8673 Personal history of transient ischemic attack (TIA), and cerebral infarction without residual deficits: Secondary | ICD-10-CM | POA: Diagnosis not present

## 2022-12-28 DIAGNOSIS — S5012XD Contusion of left forearm, subsequent encounter: Secondary | ICD-10-CM | POA: Diagnosis not present

## 2022-12-28 DIAGNOSIS — I10 Essential (primary) hypertension: Secondary | ICD-10-CM | POA: Diagnosis not present

## 2022-12-28 DIAGNOSIS — E785 Hyperlipidemia, unspecified: Secondary | ICD-10-CM | POA: Diagnosis not present

## 2022-12-28 DIAGNOSIS — R051 Acute cough: Secondary | ICD-10-CM | POA: Diagnosis not present

## 2022-12-28 DIAGNOSIS — E1142 Type 2 diabetes mellitus with diabetic polyneuropathy: Secondary | ICD-10-CM | POA: Diagnosis not present

## 2022-12-28 DIAGNOSIS — M545 Low back pain, unspecified: Secondary | ICD-10-CM | POA: Diagnosis not present

## 2022-12-28 DIAGNOSIS — E559 Vitamin D deficiency, unspecified: Secondary | ICD-10-CM | POA: Diagnosis not present

## 2022-12-28 DIAGNOSIS — E039 Hypothyroidism, unspecified: Secondary | ICD-10-CM | POA: Diagnosis not present

## 2022-12-28 DIAGNOSIS — Z9181 History of falling: Secondary | ICD-10-CM | POA: Diagnosis not present

## 2022-12-28 DIAGNOSIS — Z7902 Long term (current) use of antithrombotics/antiplatelets: Secondary | ICD-10-CM | POA: Diagnosis not present

## 2022-12-30 DIAGNOSIS — M545 Low back pain, unspecified: Secondary | ICD-10-CM | POA: Diagnosis not present

## 2022-12-30 DIAGNOSIS — E785 Hyperlipidemia, unspecified: Secondary | ICD-10-CM | POA: Diagnosis not present

## 2022-12-30 DIAGNOSIS — S5012XD Contusion of left forearm, subsequent encounter: Secondary | ICD-10-CM | POA: Diagnosis not present

## 2022-12-30 DIAGNOSIS — Z7902 Long term (current) use of antithrombotics/antiplatelets: Secondary | ICD-10-CM | POA: Diagnosis not present

## 2022-12-30 DIAGNOSIS — E1142 Type 2 diabetes mellitus with diabetic polyneuropathy: Secondary | ICD-10-CM | POA: Diagnosis not present

## 2022-12-30 DIAGNOSIS — Z9181 History of falling: Secondary | ICD-10-CM | POA: Diagnosis not present

## 2022-12-30 DIAGNOSIS — Z8673 Personal history of transient ischemic attack (TIA), and cerebral infarction without residual deficits: Secondary | ICD-10-CM | POA: Diagnosis not present

## 2022-12-30 DIAGNOSIS — I10 Essential (primary) hypertension: Secondary | ICD-10-CM | POA: Diagnosis not present

## 2022-12-30 DIAGNOSIS — E039 Hypothyroidism, unspecified: Secondary | ICD-10-CM | POA: Diagnosis not present

## 2022-12-30 DIAGNOSIS — E559 Vitamin D deficiency, unspecified: Secondary | ICD-10-CM | POA: Diagnosis not present

## 2022-12-30 DIAGNOSIS — R051 Acute cough: Secondary | ICD-10-CM | POA: Diagnosis not present

## 2023-01-02 ENCOUNTER — Ambulatory Visit: Payer: Self-pay | Admitting: *Deleted

## 2023-01-02 NOTE — Patient Instructions (Addendum)
Visit Information  Thank you for taking time to visit with me today. Please don't hesitate to contact me if I can be of assistance to you.   Following are the goals we discussed today:   Goals Addressed               This Visit's Progress     Patient Stated     home management of stomach (GI) symptoms, Dementia care coordination services (pt-stated)   Not on track     Interventions Today    Flowsheet Row Most Recent Value  Chronic Disease   Chronic disease during today's visit Other  [Stomach/GI symptoms 2 days ago after eating ravioli,  "accident" stool incontinence prior to outreach]  General Interventions   General Interventions Discussed/Reviewed General Interventions Reviewed, Sick Day Rules, Doctor Visits  [Reviewed symptoms of food poisoning, menopause,]  Doctor Visits Discussed/Reviewed Doctor Visits Reviewed, PCP  PCP/Specialist Visits Compliance with follow-up visit  Exercise Interventions   Exercise Discussed/Reviewed Exercise Reviewed, Physical Activity, Assistive device use and maintanence  Physical Activity Discussed/Reviewed Physical Activity Reviewed, Home Exercise Program (HEP)  [walking/ recently came back from [employer]  Education Interventions   Education Provided --  [food poison, menopause food choices to help with diarrhea]  Provided Verbal Education On Nutrition, Mental Health/Coping with Illness, Medication, Sick Day Rules, [employer], Other  [reviewed the [employer] (Bananas, Rice, applesauce, toast) for diarrhea, grieft resources offered but not needed, over the counter medicines for diarrhea + incontinence supplies using her U card at [employer] store,]  Mental Health Interventions   Mental Health Discussed/Reviewed Mental Health Reviewed, Coping Strategies, Grief and Loss  [recent loss of her brother]  Nutrition Interventions   Nutrition Discussed/Reviewed Nutrition Reviewed, Adding fruits and vegetables, Fluid intake, Supplemental nutrition  Pharmacy  Interventions   Pharmacy Dicussed/Reviewed Pharmacy Topics Reviewed, Medications and their functions, Affording Medications  [Reviewed side effects for elavil & lipitor (medicines recently started)]             Our next appointment is by telephone on 01/05/23 at 2:15 pm  Please call the care guide team at [phone] if you need to cancel or reschedule your appointment.   If you are experiencing a Mental Health or Behavioral Health Crisis or need someone to talk to, please call the Suicide and Crisis Lifeline: 988 call the [place] National Suicide Prevention Lifeline: 5856883277 or TTY: (680)343-6049 TTY 819 861 0429) to talk to a trained counselor call 1-800-273-TALK (toll free, 24 hour hotline) call the [facility]: 205-120-0048 call 911   Patient verbalizes understanding of instructions and care plan provided today and agrees to view in MyChart. Active MyChart status and patient understanding of how to access instructions and care plan via MyChart confirmed with patient.     The patient has been provided with contact information for the care management team and has been advised to call with any health related questions or concerns.   Ashby Moskal L. Noelle Penner, RN, BSN, [facility]  VBCI Care Management Coordinator  [phone]  Fax: [phone]

## 2023-01-02 NOTE — Patient Outreach (Signed)
  Care Coordination   Follow Up Visit Note   01/02/2023 Name: Laura Dalton MRN: [id number] DOB: Jul 14, 1950  Laura Dalton is a 72 y.o. year old female who sees Dettinger, Elige Radon, MD for primary care. I spoke with  Laura Dalton by phone today.  What matters to the patients health and wellness today?  At 11 pm 2 days ago she experienced the following symptoms: "Hot flash", stomach bloating, nausea without emesis, cooled off went to bed arms and legs weak, had some ravioli for dinner  Grief but managed - Brother passed recently Denies need of care coordination services for grief  Agrees to call RN CM if stomach/ GI symptoms increase   Goals Addressed               This Visit's Progress     Patient Stated     home management of stomach (GI) symptoms, Dementia care coordination services (pt-stated)   Not on track     Interventions Today    Flowsheet Row Most Recent Value  Chronic Disease   Chronic disease during today's visit Other  [Stomach/GI symptoms 2 days ago after eating ravioli,  "accident" stool incontinence prior to outreach]  General Interventions   General Interventions Discussed/Reviewed General Interventions Reviewed, Sick Day Rules, Doctor Visits  [Reviewed symptoms of food poisoning, menopause,]  Doctor Visits Discussed/Reviewed Doctor Visits Reviewed, PCP  PCP/Specialist Visits Compliance with follow-up visit  Exercise Interventions   Exercise Discussed/Reviewed Exercise Reviewed, Physical Activity, Assistive device use and maintanence  Physical Activity Discussed/Reviewed Physical Activity Reviewed, Home Exercise Program (HEP)  [walking/ recently came back from [employer]  Education Interventions   Education Provided --  [food poison, menopause food choices to help with diarrhea]  Provided Verbal Education On Nutrition, Mental Health/Coping with Illness, Medication, Sick Day Rules, [employer], Other  [reviewed the [employer] (Bananas, Rice,  applesauce, toast) for diarrhea, grieft resources offered but not needed, over the counter medicines for diarrhea + incontinence supplies using her U card at [employer] store,]  Mental Health Interventions   Mental Health Discussed/Reviewed Mental Health Reviewed, Coping Strategies, Grief and Loss  [recent loss of her brother]  Nutrition Interventions   Nutrition Discussed/Reviewed Nutrition Reviewed, Adding fruits and vegetables, Fluid intake, Supplemental nutrition  Pharmacy Interventions   Pharmacy Dicussed/Reviewed Pharmacy Topics Reviewed, Medications and their functions, Affording Medications  [Reviewed side effects for elavil & lipitor (medicines recently started)]             SDOH assessments and interventions completed:  No     Care Coordination Interventions:  Yes, provided   Follow up plan: Follow up call scheduled for 01/05/23    Encounter Outcome:  Patient Visit Completed   [name], RN, BSN, [facility]  VBCI Care Management Coordinator  [phone]  Fax: [phone]

## 2023-01-04 ENCOUNTER — Ambulatory Visit: Payer: Medicare Other

## 2023-01-05 ENCOUNTER — Telehealth: Payer: Self-pay | Admitting: Family Medicine

## 2023-01-05 ENCOUNTER — Ambulatory Visit: Payer: Self-pay | Admitting: *Deleted

## 2023-01-05 DIAGNOSIS — E559 Vitamin D deficiency, unspecified: Secondary | ICD-10-CM | POA: Diagnosis not present

## 2023-01-05 DIAGNOSIS — E039 Hypothyroidism, unspecified: Secondary | ICD-10-CM | POA: Diagnosis not present

## 2023-01-05 DIAGNOSIS — Z8673 Personal history of transient ischemic attack (TIA), and cerebral infarction without residual deficits: Secondary | ICD-10-CM | POA: Diagnosis not present

## 2023-01-05 DIAGNOSIS — E1142 Type 2 diabetes mellitus with diabetic polyneuropathy: Secondary | ICD-10-CM | POA: Diagnosis not present

## 2023-01-05 DIAGNOSIS — Z9181 History of falling: Secondary | ICD-10-CM | POA: Diagnosis not present

## 2023-01-05 DIAGNOSIS — Z7902 Long term (current) use of antithrombotics/antiplatelets: Secondary | ICD-10-CM | POA: Diagnosis not present

## 2023-01-05 DIAGNOSIS — R051 Acute cough: Secondary | ICD-10-CM | POA: Diagnosis not present

## 2023-01-05 DIAGNOSIS — M545 Low back pain, unspecified: Secondary | ICD-10-CM | POA: Diagnosis not present

## 2023-01-05 DIAGNOSIS — S5012XD Contusion of left forearm, subsequent encounter: Secondary | ICD-10-CM | POA: Diagnosis not present

## 2023-01-05 DIAGNOSIS — E785 Hyperlipidemia, unspecified: Secondary | ICD-10-CM | POA: Diagnosis not present

## 2023-01-05 DIAGNOSIS — I10 Essential (primary) hypertension: Secondary | ICD-10-CM | POA: Diagnosis not present

## 2023-01-05 NOTE — Telephone Encounter (Signed)
[facility] nurse) called stating that pt mentioned to her that PCP had decreased her Amitriptyline Rx to 50mg  because of recent falls she has had. Now pt says she can't sleep but was sleeping fine when she was taking Amitriptyline 100mg . Wants to know what PCP or covering provider advises for this.

## 2023-01-05 NOTE — Patient Outreach (Signed)
  Care Coordination   Follow Up Visit Note   01/05/2023 Name: Laura Dalton MRN: [id number] DOB: 16-Sep-1950  Laura Dalton is a 72 y.o. year old female who sees Laura Dalton, Laura Radon, MD for primary care. I spoke with  Laura Dalton by phone today.  What matters to the patients health and wellness today?  Stomach symptoms resolved "I feel much better" Patient reports her daughter had similar symptoms and went to the doctor. She was diagnosed with a "stomach virus. We all had it" She reports she is better now   She denies other medical and social needs    Goals Addressed               This Visit's Progress     Patient Stated     home management of stomach (GI) symptoms, Dementia care coordination services (pt-stated)   On track     Interventions Today    Flowsheet Row Most Recent Value  Chronic Disease   Chronic disease during today's visit Other  [GI stomach symptoms resolved]  General Interventions   General Interventions Discussed/Reviewed General Interventions Reviewed, Sick Day Rules             SDOH assessments and interventions completed:  No     Care Coordination Interventions:  Yes, provided   Follow up plan: Follow up call scheduled for 02/06/23 1 pm    Encounter Outcome:  Patient Visit Completed   Laura Dalton L. Noelle Penner, RN, BSN, [facility]  VBCI Care Management Coordinator  [phone]  Fax: [phone]

## 2023-01-05 NOTE — Patient Instructions (Addendum)
Visit Information  Thank you for taking time to visit with me today. Please don't hesitate to contact me if I can be of assistance to you.   Following are the goals we discussed today:   Goals Addressed               This Visit's Progress     Patient Stated     home management of stomach (GI) symptoms, Dementia care coordination services (pt-stated)   On track     Interventions Today    Flowsheet Row Most Recent Value  Chronic Disease   Chronic disease during today's visit Other  [GI stomach symptoms resolved]  General Interventions   General Interventions Discussed/Reviewed General Interventions Reviewed, Sick Day Rules             Our next appointment is by telephone on 02/06/23 at 1 pm  Please call the care guide team at [phone] if you need to cancel or reschedule your appointment.   If you are experiencing a Mental Health or Behavioral Health Crisis or need someone to talk to, please call the Suicide and Crisis Lifeline: 988 call the [place] National Suicide Prevention Lifeline: 939-301-4098 or TTY: (223)034-8938 TTY 5860032772) to talk to a trained counselor call 1-800-273-TALK (toll free, 24 hour hotline) call the [employer]: 4094728902 call 911   Patient verbalizes understanding of instructions and care plan provided today and agrees to view in MyChart. Active MyChart status and patient understanding of how to access instructions and care plan via MyChart confirmed with patient.     The patient has been provided with contact information for the care management team and has been advised to call with any health related questions or concerns.   Laura Cruise L. Noelle Penner, RN, BSN, [facility]  VBCI Care Management Coordinator  [phone]  Fax: [phone]

## 2023-01-08 NOTE — Telephone Encounter (Signed)
Left message making Laura Dalton with home health aware. Advised to call the office back with any concerns.

## 2023-01-08 NOTE — Telephone Encounter (Signed)
I would recommend that she get a little more time with therapy and make sure she is up to strength and not falling and then we can consider increasing the medicine again.  For now I would say stick with the lower dose until therapy says she is doing well enough that she is not at risk for falling as much.

## 2023-01-10 DIAGNOSIS — S5012XD Contusion of left forearm, subsequent encounter: Secondary | ICD-10-CM | POA: Diagnosis not present

## 2023-01-10 DIAGNOSIS — I10 Essential (primary) hypertension: Secondary | ICD-10-CM | POA: Diagnosis not present

## 2023-01-10 DIAGNOSIS — R051 Acute cough: Secondary | ICD-10-CM | POA: Diagnosis not present

## 2023-01-10 DIAGNOSIS — E559 Vitamin D deficiency, unspecified: Secondary | ICD-10-CM | POA: Diagnosis not present

## 2023-01-10 DIAGNOSIS — Z7902 Long term (current) use of antithrombotics/antiplatelets: Secondary | ICD-10-CM | POA: Diagnosis not present

## 2023-01-10 DIAGNOSIS — E785 Hyperlipidemia, unspecified: Secondary | ICD-10-CM | POA: Diagnosis not present

## 2023-01-10 DIAGNOSIS — E1142 Type 2 diabetes mellitus with diabetic polyneuropathy: Secondary | ICD-10-CM | POA: Diagnosis not present

## 2023-01-10 DIAGNOSIS — M545 Low back pain, unspecified: Secondary | ICD-10-CM | POA: Diagnosis not present

## 2023-01-10 DIAGNOSIS — Z9181 History of falling: Secondary | ICD-10-CM | POA: Diagnosis not present

## 2023-01-10 DIAGNOSIS — Z8673 Personal history of transient ischemic attack (TIA), and cerebral infarction without residual deficits: Secondary | ICD-10-CM | POA: Diagnosis not present

## 2023-01-10 DIAGNOSIS — E039 Hypothyroidism, unspecified: Secondary | ICD-10-CM | POA: Diagnosis not present

## 2023-01-17 DIAGNOSIS — E039 Hypothyroidism, unspecified: Secondary | ICD-10-CM | POA: Diagnosis not present

## 2023-01-17 DIAGNOSIS — Z9181 History of falling: Secondary | ICD-10-CM | POA: Diagnosis not present

## 2023-01-17 DIAGNOSIS — S5012XD Contusion of left forearm, subsequent encounter: Secondary | ICD-10-CM | POA: Diagnosis not present

## 2023-01-17 DIAGNOSIS — R051 Acute cough: Secondary | ICD-10-CM | POA: Diagnosis not present

## 2023-01-17 DIAGNOSIS — Z8673 Personal history of transient ischemic attack (TIA), and cerebral infarction without residual deficits: Secondary | ICD-10-CM | POA: Diagnosis not present

## 2023-01-17 DIAGNOSIS — Z7902 Long term (current) use of antithrombotics/antiplatelets: Secondary | ICD-10-CM | POA: Diagnosis not present

## 2023-01-17 DIAGNOSIS — M545 Low back pain, unspecified: Secondary | ICD-10-CM | POA: Diagnosis not present

## 2023-01-17 DIAGNOSIS — E1142 Type 2 diabetes mellitus with diabetic polyneuropathy: Secondary | ICD-10-CM | POA: Diagnosis not present

## 2023-01-17 DIAGNOSIS — I10 Essential (primary) hypertension: Secondary | ICD-10-CM | POA: Diagnosis not present

## 2023-01-17 DIAGNOSIS — E785 Hyperlipidemia, unspecified: Secondary | ICD-10-CM | POA: Diagnosis not present

## 2023-01-17 DIAGNOSIS — E559 Vitamin D deficiency, unspecified: Secondary | ICD-10-CM | POA: Diagnosis not present

## 2023-01-18 ENCOUNTER — Encounter: Payer: Self-pay | Admitting: Family Medicine

## 2023-01-18 ENCOUNTER — Ambulatory Visit (INDEPENDENT_AMBULATORY_CARE_PROVIDER_SITE_OTHER): Payer: Medicare Other | Admitting: Family Medicine

## 2023-01-18 VITALS — BP 132/76 | HR 79 | Ht 66.0 in | Wt 170.0 lb

## 2023-01-18 DIAGNOSIS — M8588 Other specified disorders of bone density and structure, other site: Secondary | ICD-10-CM | POA: Diagnosis not present

## 2023-01-18 DIAGNOSIS — E039 Hypothyroidism, unspecified: Secondary | ICD-10-CM | POA: Diagnosis not present

## 2023-01-18 DIAGNOSIS — E1159 Type 2 diabetes mellitus with other circulatory complications: Secondary | ICD-10-CM

## 2023-01-18 DIAGNOSIS — S32010A Wedge compression fracture of first lumbar vertebra, initial encounter for closed fracture: Secondary | ICD-10-CM

## 2023-01-18 DIAGNOSIS — E1169 Type 2 diabetes mellitus with other specified complication: Secondary | ICD-10-CM

## 2023-01-18 DIAGNOSIS — R413 Other amnesia: Secondary | ICD-10-CM

## 2023-01-18 DIAGNOSIS — I152 Hypertension secondary to endocrine disorders: Secondary | ICD-10-CM

## 2023-01-18 DIAGNOSIS — Z79899 Other long term (current) drug therapy: Secondary | ICD-10-CM

## 2023-01-18 DIAGNOSIS — E1142 Type 2 diabetes mellitus with diabetic polyneuropathy: Secondary | ICD-10-CM | POA: Diagnosis not present

## 2023-01-18 DIAGNOSIS — E785 Hyperlipidemia, unspecified: Secondary | ICD-10-CM | POA: Diagnosis not present

## 2023-01-18 MED ORDER — DONEPEZIL HCL 10 MG PO TABS: 10.0000 mg | ORAL_TABLET | Freq: Every day | ORAL | 3 refills | Status: DC

## 2023-01-18 MED ORDER — TRAMADOL HCL 50 MG PO TABS: 50.0000 mg | ORAL_TABLET | Freq: Four times a day (QID) | ORAL | 1 refills | Status: DC | PRN

## 2023-01-18 MED ORDER — AMITRIPTYLINE HCL 50 MG PO TABS: 50.0000 mg | ORAL_TABLET | Freq: Every day | ORAL | 3 refills | Status: DC

## 2023-01-18 MED ORDER — CLOPIDOGREL BISULFATE 75 MG PO TABS: 75.0000 mg | ORAL_TABLET | Freq: Every day | ORAL | 3 refills | Status: DC

## 2023-01-18 MED ORDER — ATORVASTATIN CALCIUM 40 MG PO TABS: 40.0000 mg | ORAL_TABLET | Freq: Every day | ORAL | 3 refills | Status: DC

## 2023-01-18 MED ORDER — DULOXETINE HCL 60 MG PO CPEP: 60.0000 mg | ORAL_CAPSULE | Freq: Every day | ORAL | 3 refills | Status: DC

## 2023-01-18 MED ORDER — ALENDRONATE SODIUM 70 MG PO TABS: 70.0000 mg | ORAL_TABLET | ORAL | 3 refills | Status: DC

## 2023-01-18 MED ORDER — LEVOTHYROXINE SODIUM 125 MCG PO TABS: 125.0000 ug | ORAL_TABLET | Freq: Every day | ORAL | 3 refills | Status: DC

## 2023-01-18 NOTE — Progress Notes (Signed)
BP 132/76   Pulse 79   Ht 5\' 6"  (1.676 m)   Wt 170 lb (77.1 kg)   SpO2 97%   BMI 27.44 kg/m    Subjective:   Patient ID: Laura Dalton, female    DOB: 04-07-1950, 72 y.o.   MRN: [id number]  HPI: [name] is a 72 y.o. female presenting on 01/18/2023 for Medical Management of Chronic Issues, Hyperlipidemia, Hypertension, and Hypothyroidism   HPI Type 2 diabetes mellitus Patient comes in today for recheck of his diabetes. Patient has been currently taking no medicine currently, diet control. Patient is currently on an ACE inhibitor/ARB. Patient has seen an ophthalmologist this year. Patient denies any new issues with their feet. The symptom started onset as an adult neuropathy ARE RELATED TO DM   Hypertension Patient is currently on lisinopril, and their blood pressure today is 132/76. Patient denies any lightheadedness or dizziness. Patient denies headaches, blurred vision, chest pains, shortness of breath, or weakness. Denies any side effects from medication and is content with current medication.   Hyperlipidemia Patient is coming in for recheck of his hyperlipidemia. The patient is currently taking atorvastatin. They deny any issues with myalgias or history of liver damage from it. They deny any focal numbness or weakness or chest pain.   Hypothyroidism recheck Patient is coming in for thyroid recheck today as well. They deny any issues with hair changes or heat or cold problems or diarrhea or constipation. They deny any chest pain or palpitations. They are currently on levothyroxine 125 micrograms   Pain assessment: Cause of pain-lumbar compression fracture and lumbar spondylosis Pain location-left lower back is the worst Pain on scale of 1-10- 5 Frequency- daily What increases pain-certain movements What makes pain Better-rest and tramadol Effects on ADL -minimal Any change in general medical condition-worsening memory but otherwise stable  Current opioids  rx-tramadol 50 mg every 6 hours as needed # meds rx-28 tablets, does not use frequently Effectiveness of current meds-works well Adverse reactions from pain meds-none Morphine equivalent-520  Pill count performed-No Last drug screen -N/A ( high risk q23m, moderate risk q44m, low risk yearly ) Urine drug screen today- Yes Was the NCCSR reviewed-yes  If yes were their any concerning findings? -None Pain contract signed on: Today  Relevant past medical, surgical, family and social history reviewed and updated as indicated. Interim medical history since our last visit reviewed. Allergies and medications reviewed and updated.  Review of Systems  Constitutional:  Negative for chills and fever.  Eyes:  Negative for visual disturbance.  Respiratory:  Negative for chest tightness and shortness of breath.   Cardiovascular:  Negative for chest pain and leg swelling.  Genitourinary:  Negative for difficulty urinating and dysuria.  Musculoskeletal:  Positive for arthralgias, back pain and myalgias. Negative for gait problem.  Skin:  Negative for rash.  Neurological:  Negative for dizziness, light-headedness and headaches.  Psychiatric/Behavioral:  Positive for confusion and sleep disturbance. Negative for agitation, behavioral problems and decreased concentration. The patient is nervous/anxious.   All other systems reviewed and are negative.   Per HPI unless specifically indicated above   Allergies as of 01/18/2023       Reactions   Aspirin Hives   Gabapentin Other (See Comments)   Dizzy,falls        Medication List        Accurate as of January 18, 2023 11:52 AM. If you have any questions, ask your nurse or doctor.  albuterol 108 (90 Base) MCG/ACT inhaler Commonly known as: VENTOLIN HFA 2 puffs every 6 hours as needed for wheezing or shortness of breath   alendronate 70 MG tablet Commonly known as: FOSAMAX Take 1 tablet (70 mg total) by mouth once a week. Take with  a full glass of water on an empty stomach. What changed: See the new instructions. Changed by: Elige Radon Aseem Sessums   amitriptyline 50 MG tablet Commonly known as: ELAVIL Take 1 tablet (50 mg total) by mouth at bedtime.   atorvastatin 40 MG tablet Commonly known as: LIPITOR Take 1 tablet (40 mg total) by mouth daily.   calcium carbonate 1500 (600 Ca) MG Tabs tablet Commonly known as: OSCAL Take by mouth 2 (two) times daily with a meal.   clopidogrel 75 MG tablet Commonly known as: PLAVIX Take 1 tablet (75 mg total) by mouth daily.   donepezil 10 MG tablet Commonly known as: ARICEPT Take 1 tablet (10 mg total) by mouth at bedtime.   DULoxetine 60 MG capsule Commonly known as: CYMBALTA Take 1 capsule (60 mg total) by mouth daily.   levothyroxine 125 MCG tablet Commonly known as: SYNTHROID Take 1 tablet (125 mcg total) by mouth daily before breakfast.   lisinopril 40 MG tablet Commonly known as: ZESTRIL Take 1 tablet (40 mg total) by mouth daily.   traMADol 50 MG tablet Commonly known as: ULTRAM Take 1 tablet (50 mg total) by mouth every 6 (six) hours as needed.   triamcinolone cream 0.1 % Commonly known as: KENALOG Apply 1 application topically 2 (two) times daily.         Objective:   BP 132/76   Pulse 79   Ht 5\' 6"  (1.676 m)   Wt 170 lb (77.1 kg)   SpO2 97%   BMI 27.44 kg/m   Wt Readings from Last 3 Encounters:  01/18/23 170 lb (77.1 kg)  12/13/22 177 lb (80.3 kg)  12/11/22 180 lb (81.6 kg)    Physical Exam Vitals and nursing note reviewed.  Constitutional:      General: She is not in acute distress.    Appearance: She is well-developed. She is not diaphoretic.  Eyes:     Conjunctiva/sclera: Conjunctivae normal.  Cardiovascular:     Rate and Rhythm: Normal rate and regular rhythm.     Heart sounds: Normal heart sounds. No murmur heard. Pulmonary:     Effort: Pulmonary effort is normal. No respiratory distress.     Breath sounds: Normal breath  sounds. No wheezing.  Musculoskeletal:        General: No swelling or tenderness. Normal range of motion.  Skin:    General: Skin is warm and dry.     Findings: No rash.  Neurological:     Mental Status: She is alert and oriented to person, place, and time.     Coordination: Coordination normal.  Psychiatric:        Behavior: Behavior normal.       Assessment & Plan:   Problem List Items Addressed This Visit       Cardiovascular and Mediastinum   Hypertension associated with diabetes (HCC)   Relevant Medications   atorvastatin (LIPITOR) 40 MG tablet     Endocrine   DM (diabetes mellitus) (HCC) - Primary   Relevant Medications   atorvastatin (LIPITOR) 40 MG tablet   Other Relevant Orders   BMP8+EGFR   CBC with Differential/Platelet   Bayer DCA Hb A1c Waived   Microalbumin / creatinine urine ratio  Hyperlipidemia associated with type 2 diabetes mellitus (HCC)   Relevant Medications   atorvastatin (LIPITOR) 40 MG tablet   Diabetic peripheral neuropathy associated with type 2 diabetes mellitus (HCC)   Relevant Medications   atorvastatin (LIPITOR) 40 MG tablet   amitriptyline (ELAVIL) 50 MG tablet   donepezil (ARICEPT) 10 MG tablet   DULoxetine (CYMBALTA) 60 MG capsule   Hypothyroidism   Relevant Medications   levothyroxine (SYNTHROID) 125 MCG tablet   Other Relevant Orders   TSH   Dyslipidemia due to type 2 diabetes mellitus (HCC)   Relevant Medications   atorvastatin (LIPITOR) 40 MG tablet     Musculoskeletal and Integument   Lumbar compression fracture (HCC)   Relevant Medications   alendronate (FOSAMAX) 70 MG tablet   Other Visit Diagnoses     Osteopenia of lumbar spine       Relevant Orders   VITAMIN D 25 Hydroxy (Vit-D Deficiency, Fractures)   Controlled substance agreement signed       Relevant Orders   ToxASSURE Select 13 (MW), Urine   Memory deficit       Relevant Medications   donepezil (ARICEPT) 10 MG tablet     Continue current medicine,  and her memory is not great so I do not know for sure what she is taking based on exam, tried to have discussion with her to try and bring a family member with their and she is resistant.  Follow up plan: Return in about 3 months (around 04/20/2023), or if symptoms worsen or fail to improve, for Diabetes and hypertension and pain management.  Counseling provided for all of the vaccine components Orders Placed This Encounter  Procedures   BMP8+EGFR   CBC with Differential/Platelet   Bayer DCA Hb A1c Waived   TSH   VITAMIN D 25 Hydroxy (Vit-D Deficiency, Fractures)   ToxASSURE Select 13 (MW), Urine   Microalbumin / creatinine urine ratio    Arville Care, MD [facility] 01/18/2023, 11:52 AM

## 2023-01-19 LAB — MICROALBUMIN / CREATININE URINE RATIO
Creatinine, Urine: 45.2 mg/dL
Microalb/Creat Ratio: 8 mg/g{creat} (ref 0–29)
Microalbumin, Urine: 3.4 ug/mL

## 2023-01-21 ENCOUNTER — Other Ambulatory Visit: Payer: Self-pay | Admitting: Family Medicine

## 2023-01-21 DIAGNOSIS — S32010A Wedge compression fracture of first lumbar vertebra, initial encounter for closed fracture: Secondary | ICD-10-CM

## 2023-01-22 ENCOUNTER — Telehealth: Payer: Self-pay | Admitting: Family Medicine

## 2023-01-22 NOTE — Telephone Encounter (Signed)
Copied from CRM [id number]. Topic: Clinical - Lab/Test Results >> Jan 22, 2023 10:58 AM Herbert Seta B wrote: Reason for CRM: Patient has appointment for wellness check on 11/14. Would like to know if she needs this appointment, was just in last week. Patient also would like to know results of recent urinalysis.

## 2023-01-22 NOTE — Telephone Encounter (Signed)
Pt made aware that the appt on 11/14 is her [facility] Annual Wellness Visit over the phone.

## 2023-01-24 DIAGNOSIS — E785 Hyperlipidemia, unspecified: Secondary | ICD-10-CM | POA: Diagnosis not present

## 2023-01-24 DIAGNOSIS — R051 Acute cough: Secondary | ICD-10-CM | POA: Diagnosis not present

## 2023-01-24 DIAGNOSIS — Z8673 Personal history of transient ischemic attack (TIA), and cerebral infarction without residual deficits: Secondary | ICD-10-CM | POA: Diagnosis not present

## 2023-01-24 DIAGNOSIS — E1142 Type 2 diabetes mellitus with diabetic polyneuropathy: Secondary | ICD-10-CM | POA: Diagnosis not present

## 2023-01-24 DIAGNOSIS — S5012XD Contusion of left forearm, subsequent encounter: Secondary | ICD-10-CM | POA: Diagnosis not present

## 2023-01-24 DIAGNOSIS — I10 Essential (primary) hypertension: Secondary | ICD-10-CM | POA: Diagnosis not present

## 2023-01-24 DIAGNOSIS — Z7902 Long term (current) use of antithrombotics/antiplatelets: Secondary | ICD-10-CM | POA: Diagnosis not present

## 2023-01-24 DIAGNOSIS — E039 Hypothyroidism, unspecified: Secondary | ICD-10-CM | POA: Diagnosis not present

## 2023-01-24 DIAGNOSIS — M545 Low back pain, unspecified: Secondary | ICD-10-CM | POA: Diagnosis not present

## 2023-01-24 DIAGNOSIS — E559 Vitamin D deficiency, unspecified: Secondary | ICD-10-CM | POA: Diagnosis not present

## 2023-01-24 DIAGNOSIS — Z9181 History of falling: Secondary | ICD-10-CM | POA: Diagnosis not present

## 2023-01-24 LAB — TOXASSURE SELECT 13 (MW), URINE

## 2023-01-25 DIAGNOSIS — R051 Acute cough: Secondary | ICD-10-CM | POA: Diagnosis not present

## 2023-01-25 DIAGNOSIS — E559 Vitamin D deficiency, unspecified: Secondary | ICD-10-CM | POA: Diagnosis not present

## 2023-01-25 DIAGNOSIS — E1142 Type 2 diabetes mellitus with diabetic polyneuropathy: Secondary | ICD-10-CM | POA: Diagnosis not present

## 2023-01-25 DIAGNOSIS — Z7902 Long term (current) use of antithrombotics/antiplatelets: Secondary | ICD-10-CM | POA: Diagnosis not present

## 2023-01-25 DIAGNOSIS — Z8673 Personal history of transient ischemic attack (TIA), and cerebral infarction without residual deficits: Secondary | ICD-10-CM | POA: Diagnosis not present

## 2023-01-25 DIAGNOSIS — E039 Hypothyroidism, unspecified: Secondary | ICD-10-CM | POA: Diagnosis not present

## 2023-01-25 DIAGNOSIS — I10 Essential (primary) hypertension: Secondary | ICD-10-CM | POA: Diagnosis not present

## 2023-01-25 DIAGNOSIS — E785 Hyperlipidemia, unspecified: Secondary | ICD-10-CM | POA: Diagnosis not present

## 2023-01-25 DIAGNOSIS — Z9181 History of falling: Secondary | ICD-10-CM | POA: Diagnosis not present

## 2023-01-25 DIAGNOSIS — M545 Low back pain, unspecified: Secondary | ICD-10-CM | POA: Diagnosis not present

## 2023-01-25 DIAGNOSIS — S5012XD Contusion of left forearm, subsequent encounter: Secondary | ICD-10-CM | POA: Diagnosis not present

## 2023-01-27 ENCOUNTER — Other Ambulatory Visit: Payer: Self-pay | Admitting: Family Medicine

## 2023-01-27 DIAGNOSIS — E1169 Type 2 diabetes mellitus with other specified complication: Secondary | ICD-10-CM

## 2023-01-27 DIAGNOSIS — E1142 Type 2 diabetes mellitus with diabetic polyneuropathy: Secondary | ICD-10-CM

## 2023-01-27 DIAGNOSIS — S32010A Wedge compression fracture of first lumbar vertebra, initial encounter for closed fracture: Secondary | ICD-10-CM

## 2023-01-29 ENCOUNTER — Ambulatory Visit (INDEPENDENT_AMBULATORY_CARE_PROVIDER_SITE_OTHER): Payer: Medicare Other

## 2023-01-29 DIAGNOSIS — I10 Essential (primary) hypertension: Secondary | ICD-10-CM | POA: Diagnosis not present

## 2023-01-29 DIAGNOSIS — E785 Hyperlipidemia, unspecified: Secondary | ICD-10-CM

## 2023-01-29 DIAGNOSIS — F0280 Dementia in other diseases classified elsewhere without behavioral disturbance: Secondary | ICD-10-CM | POA: Diagnosis not present

## 2023-01-29 DIAGNOSIS — E039 Hypothyroidism, unspecified: Secondary | ICD-10-CM | POA: Diagnosis not present

## 2023-01-29 DIAGNOSIS — E1142 Type 2 diabetes mellitus with diabetic polyneuropathy: Secondary | ICD-10-CM

## 2023-01-29 DIAGNOSIS — R051 Acute cough: Secondary | ICD-10-CM | POA: Diagnosis not present

## 2023-01-29 DIAGNOSIS — M545 Low back pain, unspecified: Secondary | ICD-10-CM

## 2023-01-29 DIAGNOSIS — E559 Vitamin D deficiency, unspecified: Secondary | ICD-10-CM

## 2023-01-31 ENCOUNTER — Ambulatory Visit (INDEPENDENT_AMBULATORY_CARE_PROVIDER_SITE_OTHER): Payer: Medicare Other

## 2023-01-31 VITALS — Ht 66.0 in | Wt 170.0 lb

## 2023-01-31 DIAGNOSIS — E039 Hypothyroidism, unspecified: Secondary | ICD-10-CM | POA: Diagnosis not present

## 2023-01-31 DIAGNOSIS — Z0000 Encounter for general adult medical examination without abnormal findings: Secondary | ICD-10-CM | POA: Diagnosis not present

## 2023-01-31 DIAGNOSIS — Z9181 History of falling: Secondary | ICD-10-CM | POA: Diagnosis not present

## 2023-01-31 DIAGNOSIS — E785 Hyperlipidemia, unspecified: Secondary | ICD-10-CM | POA: Diagnosis not present

## 2023-01-31 DIAGNOSIS — Z7902 Long term (current) use of antithrombotics/antiplatelets: Secondary | ICD-10-CM | POA: Diagnosis not present

## 2023-01-31 DIAGNOSIS — I10 Essential (primary) hypertension: Secondary | ICD-10-CM | POA: Diagnosis not present

## 2023-01-31 DIAGNOSIS — Z8673 Personal history of transient ischemic attack (TIA), and cerebral infarction without residual deficits: Secondary | ICD-10-CM | POA: Diagnosis not present

## 2023-01-31 DIAGNOSIS — Z1231 Encounter for screening mammogram for malignant neoplasm of breast: Secondary | ICD-10-CM

## 2023-01-31 DIAGNOSIS — E1142 Type 2 diabetes mellitus with diabetic polyneuropathy: Secondary | ICD-10-CM | POA: Diagnosis not present

## 2023-01-31 DIAGNOSIS — S5012XD Contusion of left forearm, subsequent encounter: Secondary | ICD-10-CM | POA: Diagnosis not present

## 2023-01-31 DIAGNOSIS — M545 Low back pain, unspecified: Secondary | ICD-10-CM | POA: Diagnosis not present

## 2023-01-31 DIAGNOSIS — Z2821 Immunization not carried out because of patient refusal: Secondary | ICD-10-CM

## 2023-01-31 DIAGNOSIS — R051 Acute cough: Secondary | ICD-10-CM | POA: Diagnosis not present

## 2023-01-31 DIAGNOSIS — E559 Vitamin D deficiency, unspecified: Secondary | ICD-10-CM | POA: Diagnosis not present

## 2023-01-31 NOTE — Patient Instructions (Signed)
Laura Dalton , Thank you for taking time to come for your Medicare Wellness Visit. I appreciate your ongoing commitment to your health goals. Please review the following plan we discussed and let me know if I can assist you in the future.   Referrals/Orders/Follow-Ups/Clinician Recommendations:  Next Medicare Annual Wellness Visit: February 01, 2024 @ 11:20 am virtual visit  You have an order for:  []   2D Mammogram  [x]   3D Mammogram  []   Bone Density     Please call for appointment:   [facility] [employer] [phone]  Make sure to wear two-piece clothing.  No lotions powders or deodorants the day of the appointment Make sure to bring picture ID and insurance card.  Bring list of medications you are currently taking including any supplements.   Schedule your [place] screening mammogram through MyChart!   Log into your MyChart account.  Go to 'Visit' (or 'Appointments' if on mobile App) --> Schedule an Appointment  Under 'Select a Reason for Visit' choose the Mammogram Screening option.  Complete the pre-visit questions and select the time and place that best fits your schedule.   This is a list of the screening recommended for you and due dates:  Health Maintenance  Topic Date Due   Eye exam for diabetics  05/24/2022   Flu Shot  06/11/2023*   Colon Cancer Screening  07/17/2023*   Stool Blood Test  07/18/2023*   Mammogram  02/16/2023   DEXA scan (bone density measurement)  04/13/2023   Hemoglobin A1C  04/25/2023   Complete foot exam   07/17/2023   Yearly kidney function blood test for diabetes  12/11/2023   Yearly kidney health urinalysis for diabetes  01/18/2024   Medicare Annual Wellness Visit  01/31/2024   DTaP/Tdap/Td vaccine (3 - Td or Tdap) 07/16/2032   Pneumonia Vaccine  Completed   Hepatitis C Screening  Completed   Zoster (Shingles) Vaccine  Completed   HPV Vaccine  Aged Out   Screening for Lung Cancer  Discontinued   COVID-19  Vaccine  Discontinued  *Topic was postponed. The date shown is not the original due date.    Advanced directives: (ACP Link)Information on Advanced Care Planning can be found at [facility] Advance Health Care Directives Advance Health Care Directives ([email or web address]   Next Medicare Annual Wellness Visit scheduled for next year: Yes  Preventive Care 65 Years and Older, Female Preventive care refers to lifestyle choices and visits with your health care provider that can promote health and wellness. Preventive care visits are also called wellness exams. What can I expect for my preventive care visit? Counseling Your health care provider may ask you questions about your: Medical history, including: Past medical problems. Family medical history. Pregnancy and menstrual history. History of falls. Current health, including: Memory and ability to understand (cognition). Emotional well-being. Home life and relationship well-being. Sexual activity and sexual health. Lifestyle, including: Alcohol, nicotine or tobacco, and drug use. Access to firearms. Diet, exercise, and sleep habits. Work and work Astronomer. Sunscreen use. Safety issues such as seatbelt and bike helmet use. Physical exam Your health care provider will check your: Height and weight. These may be used to calculate your BMI (body mass index). BMI is a measurement that tells if you are at a healthy weight. Waist circumference. This measures the distance around your waistline. This measurement also tells if you are at a healthy weight and may help predict your risk of certain diseases, such  as type 2 diabetes and high blood pressure. Heart rate and blood pressure. Body temperature. Skin for abnormal spots. What immunizations do I need?  Vaccines are usually given at various ages, according to a schedule. Your health care provider will recommend vaccines for you based on your age, medical history, and  lifestyle or other factors, such as travel or where you work. What tests do I need? Screening Your health care provider may recommend screening tests for certain conditions. This may include: Lipid and cholesterol levels. Hepatitis C test. Hepatitis B test. HIV (human immunodeficiency virus) test. STI (sexually transmitted infection) testing, if you are at risk. Lung cancer screening. Colorectal cancer screening. Diabetes screening. This is done by checking your blood sugar (glucose) after you have not eaten for a while (fasting). Mammogram. Talk with your health care provider about how often you should have regular mammograms. BRCA-related cancer screening. This may be done if you have a family history of breast, ovarian, tubal, or peritoneal cancers. Bone density scan. This is done to screen for osteoporosis. Talk with your health care provider about your test results, treatment options, and if necessary, the need for more tests. Follow these instructions at home: Eating and drinking  Eat a diet that includes fresh fruits and vegetables, whole grains, lean protein, and low-fat dairy products. Limit your intake of foods with high amounts of sugar, saturated fats, and salt. Take vitamin and mineral supplements as recommended by your health care provider. Do not drink alcohol if your health care provider tells you not to drink. If you drink alcohol: Limit how much you have to 0-1 drink a day. Know how much alcohol is in your drink. In the [place] one drink equals one 12 oz bottle of beer (355 mL), one 5 oz glass of wine (148 mL), or one 1 oz glass of hard liquor (44 mL). Lifestyle Brush your teeth every morning and night with fluoride toothpaste. Floss one time each day. Exercise for at least 30 minutes 5 or more days each week. Do not use any products that contain nicotine or tobacco. These products include cigarettes, chewing tobacco, and vaping devices, such as e-cigarettes. If you need  help quitting, ask your health care provider. Do not use drugs. If you are sexually active, practice safe sex. Use a condom or other form of protection in order to prevent STIs. Take aspirin only as told by your health care provider. Make sure that you understand how much to take and what form to take. Work with your health care provider to find out whether it is safe and beneficial for you to take aspirin daily. Ask your health care provider if you need to take a cholesterol-lowering medicine (statin). Find healthy ways to manage stress, such as: Meditation, yoga, or listening to music. Journaling. Talking to a trusted person. Spending time with friends and family. Minimize exposure to UV radiation to reduce your risk of skin cancer. Safety Always wear your seat belt while driving or riding in a vehicle. Do not drive: If you have been drinking alcohol. Do not ride with someone who has been drinking. When you are tired or distracted. While texting. If you have been using any mind-altering substances or drugs. Wear a helmet and other protective equipment during sports activities. If you have firearms in your house, make sure you follow all gun safety procedures. What's next? Visit your health care provider once a year for an annual wellness visit. Ask your health care provider how often you  should have your eyes and teeth checked. Stay up to date on all vaccines. This information is not intended to replace advice given to you by your health care provider. Make sure you discuss any questions you have with your health care provider. Document Revised: 08/25/2020 Document Reviewed: 08/25/2020 Elsevier Patient Education  2024 [employer]. Understanding Your Risk for Falls Millions of people have serious injuries from falls each year. It is important to understand your risk of falling. Talk with your health care provider about your risk and what you can do to lower it. If you do have a  serious fall, make sure to tell your provider. Falling once raises your risk of falling again. How can falls affect me? Serious injuries from falls are common. These include: Broken bones, such as hip fractures. Head injuries, such as traumatic brain injuries (TBI) or concussions. A fear of falling can cause you to avoid activities and stay at home. This can make your muscles weaker and raise your risk for a fall. What can increase my risk? There are a number of risk factors that increase your risk for falling. The more risk factors you have, the higher your risk of falling. Serious injuries from a fall happen most often to people who are older than 73 years old. Teenagers and young adults ages 1-29 are also at higher risk. Common risk factors include: Weakness in the lower body. Being generally weak or confused due to long-term (chronic) illness. Dizziness or balance problems. Poor vision. Medicines that cause dizziness or drowsiness. These may include: Medicines for your blood pressure, heart, anxiety, insomnia, or swelling (edema). Pain medicines. Muscle relaxants. Other risk factors include: Drinking alcohol. Having had a fall in the past. Having foot pain or wearing improper footwear. Working at a dangerous job. Having any of the following in your home: Tripping hazards, such as floor clutter or loose rugs. Poor lighting. Pets. Having dementia or memory loss. What actions can I take to lower my risk of falling?     Physical activity Stay physically fit. Do strength and balance exercises. Consider taking a regular class to build strength and balance. Yoga and tai chi are good options. Vision Have your eyes checked every year and your prescription for glasses or contacts updated as needed. Shoes and walking aids Wear non-skid shoes. Wear shoes that have rubber soles and low heels. Do not wear high heels. Do not walk around the house in socks or slippers. Use a cane or  walker as told by your provider. Home safety Attach secure railings on both sides of your stairs. Install grab bars for your bathtub, shower, and toilet. Use a non-skid mat in your bathtub or shower. Attach bath mats securely with double-sided, non-slip rug tape. Use good lighting in all rooms. Keep a flashlight near your bed. Make sure there is a clear path from your bed to the bathroom. Use night-lights. Do not use throw rugs. Make sure all carpeting is taped or tacked down securely. Remove all clutter from walkways and stairways, including extension cords. Repair uneven or broken steps and floors. Avoid walking on icy or slippery surfaces. Walk on the grass instead of on icy or slick sidewalks. Use ice melter to get rid of ice on walkways in the winter. Use a cordless phone. Questions to ask your health care provider Can you help me check my risk for a fall? Do any of my medicines make me more likely to fall? Should I take a vitamin D supplement?  What exercises can I do to improve my strength and balance? Should I make an appointment to have my vision checked? Do I need a bone density test to check for weak bones (osteoporosis)? Would it help to use a cane or a walker? Where to find more information Centers for Disease Control and Prevention, STEAD[employer]: TonerPromos.no Community-Based Fall Prevention Programs: TonerPromos.no [employer] on Aging: BaseRingTones.pl Contact a health care provider if: You fall at home. You are afraid of falling at home. You feel weak, drowsy, or dizzy. This information is not intended to replace advice given to you by your health care provider. Make sure you discuss any questions you have with your health care provider. Document Revised: 10/31/2021 Document Reviewed: 10/31/2021 Elsevier Patient Education  2024 [employer].

## 2023-01-31 NOTE — Progress Notes (Signed)
Because this visit was a virtual/telehealth visit,  certain criteria was not obtained, such a blood pressure, CBG if applicable, and timed get up and go. Any medications not marked as "taking" were not mentioned during the medication reconciliation part of the visit. Any vitals not documented were not able to be obtained due to this being a telehealth visit or patient was unable to self-report a recent blood pressure reading due to a lack of equipment at home via telehealth. Vitals that have been documented are verbally provided by the patient.   Subjective:   Laura Dalton is a 72 y.o. female who presents for Medicare Annual (Subsequent) preventive examination.  Visit Complete: Virtual I connected with  Laura Dalton on 01/31/23 by a audio enabled telemedicine application and verified that I am speaking with the correct person using two identifiers.  Patient Location: Home  Provider Location: Home Office  I discussed the limitations of evaluation and management by telemedicine. The patient expressed understanding and agreed to proceed.  Vital Signs: Because this visit was a virtual/telehealth visit, some criteria may be missing or patient reported. Any vitals not documented were not able to be obtained and vitals that have been documented are patient reported.  Patient Medicare AWV questionnaire was completed by the patient on na; I have confirmed that all information answered by patient is correct and no changes since this date.  Cardiac Risk Factors include: advanced age (>56men, >21 women);diabetes mellitus;dyslipidemia;hypertension;sedentary lifestyle     Objective:    Today's Vitals   01/31/23 1259  Weight: 170 lb (77.1 kg)  Height: 5\' 6"  (1.676 m)   Body mass index is 27.44 kg/m.     01/31/2023   12:59 PM 12/11/2022    1:27 PM 12/11/2022    1:26 PM 04/26/2022    2:43 PM 01/23/2022   11:17 AM 07/14/2021   12:33 PM 01/11/2021   11:21 AM  Advanced Directives  Does  Patient Have a Medical Advance Directive? No No No No No No No  Would patient like information on creating a medical advance directive? No - Patient declined    No - Patient declined No - Patient declined No - Patient declined    Current Medications (verified) Outpatient Encounter Medications as of 01/31/2023  Medication Sig   albuterol (VENTOLIN HFA) 108 (90 Base) MCG/ACT inhaler 2 puffs every 6 hours as needed for wheezing or shortness of breath   alendronate (FOSAMAX) 70 MG tablet TAKE 1 TABLET BY MOUTH WEEKLY  WITH 8 OZ OF PLAIN WATER 30  MINUTES BEFORE FIRST FOOD, DRINK OR MEDS. STAY UPRIGHT FOR 30  MINS   amitriptyline (ELAVIL) 50 MG tablet TAKE 1 TABLET BY MOUTH AT  BEDTIME   atorvastatin (LIPITOR) 40 MG tablet TAKE 1 TABLET BY MOUTH DAILY   calcium carbonate (OSCAL) 1500 (600 Ca) MG TABS tablet Take by mouth 2 (two) times daily with a meal.   clopidogrel (PLAVIX) 75 MG tablet TAKE 1 TABLET BY MOUTH DAILY   donepezil (ARICEPT) 10 MG tablet Take 1 tablet (10 mg total) by mouth at bedtime.   DULoxetine (CYMBALTA) 60 MG capsule TAKE 1 CAPSULE BY MOUTH DAILY   levothyroxine (SYNTHROID) 125 MCG tablet TAKE 1 TABLET BY MOUTH DAILY  BEFORE BREAKFAST   lisinopril (ZESTRIL) 40 MG tablet Take 1 tablet (40 mg total) by mouth daily.   traMADol (ULTRAM) 50 MG tablet Take 1 tablet (50 mg total) by mouth every 6 (six) hours as needed.   triamcinolone cream (KENALOG) 0.1 %  Apply 1 application topically 2 (two) times daily.   No facility-administered encounter medications on file as of 01/31/2023.    Allergies (verified) Aspirin and Gabapentin   History: Past Medical History:  Diagnosis Date   Bursitis of hip    Diabetes mellitus without complication (HCC)    type II   Hyperlipidemia    Hypertension    Hypothyroidism    Low back pain    benign turmor back   Stroke (HCC)    Vitamin D deficiency disease    Past Surgical History:  Procedure Laterality Date   APPENDECTOMY     BACK  SURGERY  1996   turmor on back    CHOLECYSTECTOMY     LEG SURGERY Right    "clot and had drain in leg  rt lower leg   Family History  Problem Relation Age of Onset   Diabetes Mother    Stroke Mother    Seizures Mother    Cancer Father        prostate   COPD Sister    COPD Sister    COPD Sister    Cancer Brother        lung   Cancer Brother        prostate   COPD Brother    COPD Brother    Thyroid disease Son    Hashimoto's thyroiditis Son    Breast cancer Neg Hx    Social History   Socioeconomic History   Marital status: Widowed    Spouse name: Not on file   Number of children: 3   Years of education: 14   Highest education level: Associate degree: academic program  Occupational History   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   Children out of town   She depends on her neighbor a lot for yard work and to check on her daily   Social Determinants of Health   Financial Resource Strain: Low Risk  (01/31/2023)   Overall Financial Resource Strain (CARDIA)    Difficulty of Paying Living Expenses: Not hard at all  Food Insecurity: No Food Insecurity (01/31/2023)   Hunger Vital Sign    Worried About Running Out of Food in the Last Year: Never true    Ran Out of Food in the Last Year: Never true  Transportation Needs: No Transportation Needs (01/31/2023)   PRAPARE - Administrator, Civil Service (Medical): No    Lack of  Transportation (Non-Medical): No  Physical Activity: Inactive (01/31/2023)   Exercise Vital Sign    Days of Exercise per Week: 0 days    Minutes of Exercise per Session: 0 min  Stress: No Stress Concern Present (01/31/2023)   [employer] of Occupational Health - Occupational Stress Questionnaire    Feeling of Stress : Not at all  Social Connections: Moderately  Integrated (01/31/2023)   Social Connection and Isolation Panel [NHANES]    Frequency of Communication with Friends and Family: More than three times a week    Frequency of Social Gatherings with Friends and Family: More than three times a week    Attends Religious Services: More than 4 times per year    Active Member of [employer] or Organizations: Yes    Attends Banker Meetings: More than 4 times per year    Marital Status: Widowed    Tobacco Counseling Counseling given: Yes   Clinical Intake:  Pre-visit preparation completed: Yes  Pain : No/denies pain     BMI - recorded: 27.44 Nutritional Status: BMI 25 -29 Overweight Nutritional Risks: None Diabetes: No  How often do you need to have someone help you when you read instructions, pamphlets, or other written materials from your doctor or pharmacy?: 1 - Never  Interpreter Needed?: No  Information entered by :: Maryjean Ka CMA   Activities of Daily Living    01/31/2023    1:20 PM  In your present state of health, do you have any difficulty performing the following activities:  Hearing? 0  Vision? 0  Difficulty concentrating or making decisions? 0  Walking or climbing stairs? 0  Dressing or bathing? 0  Doing errands, shopping? 0  Preparing Food and eating ? N  Using the Toilet? N  In the past six months, have you accidently leaked urine? N  Do you have problems with loss of bowel control? N  Managing your Medications? N  Managing your Finances? N  Housekeeping or managing your Housekeeping? N    Patient Care Team: Dettinger, Elige Radon, MD  as PCP - General (Family Medicine) Wyline Mood Dorothe Pea, MD as PCP - Cardiology (Cardiology) Donnetta Hail, MD as Consulting Physician (Rheumatology) Ranee Gosselin, MD as Consulting Physician (Orthopedic Surgery) Len Blalock, NP (Inactive) as Nurse Practitioner (Internal Medicine) Clinton Gallant, RN as [facility] Care Management Oliver Barre, MD as Consulting Physician (Orthopedic Surgery)  Indicate any recent Medical Services you may have received from other than [facility] providers in the past year (date may be approximate).     Assessment:   This is a routine wellness examination for Tandria.  Hearing/Vision screen Hearing Screening - Comments:: Patient denies any hearing difficulties.   Vision Screening - Comments:: Wears rx glasses - up to date with routine eye exams  Sees Dr. Desiree Lucy [facility]   Goals Addressed             This Visit's Progress    Patient Stated       To get to a point to where I no longer have to use a walker       Depression Screen    01/31/2023    1:07 PM 01/18/2023   11:20 AM 12/13/2022    2:04 PM 10/23/2022    3:01 PM 10/17/2022   10:44 AM 08/25/2022    9:08 AM 07/17/2022   11:59 AM  PHQ 2/9 Scores  PHQ - 2 Score 4 2 4  0 2 4 2   PHQ- 9 Score 9 6 10 1 8 17 5     Fall Risk    01/31/2023    1:20 PM 01/18/2023   11:20 AM 12/13/2022    2:04 PM 08/25/2022    9:08 AM 07/17/2022   11:59 AM  Fall Risk   Falls in the past year? 1 1 1 1 1   Number falls in past  yr: 1 1 1 1  0  Injury with Fall? 1 1 1 1  0  Risk for fall due to : History of fall(s);Impaired balance/gait;Impaired mobility;Medication side effect History of fall(s);Impaired balance/gait;Impaired mobility;Medication side effect History of fall(s);Medication side effect;Impaired mobility History of fall(s);Impaired balance/gait;Impaired mobility Impaired balance/gait  Follow up Education provided;Falls prevention discussed Falls evaluation completed Falls evaluation completed  Falls evaluation completed Falls evaluation completed    MEDICARE RISK AT HOME: Medicare Risk at Home Any stairs in or around the home?: Yes If so, are there any without handrails?: No Home free of loose throw rugs in walkways, pet beds, electrical cords, etc?: Yes Life alert?: No Use of a cane, walker or w/c?: Yes Grab bars in the bathroom?: Yes Shower chair or bench in shower?: No Elevated toilet seat or a handicapped toilet?: Yes  TIMED UP AND GO:  Was the test performed?  No    Cognitive Function:    10/23/2022    3:00 PM 09/03/2017    9:30 AM 08/31/2016   11:51 AM  MMSE - Mini Mental State Exam  Orientation to time 4 5 5   Orientation to Place 4 5 5   Registration 3 3 3   Attention/ Calculation 5 5 5   Recall 3 2 3   Language- name 2 objects 2 2 2   Language- repeat 1 1 1   Language- follow 3 step command 3 3 3   Language- read & follow direction 1 1 1   Write a sentence 1 1 1   Copy design 0 1 1  Total score 27 29 30         01/31/2023    1:03 PM 01/23/2022   11:16 AM 11/16/2020    9:42 AM 12/16/2018    8:58 AM  6CIT Screen  What Year? 0 points 0 points 0 points 0 points  What month? 0 points 0 points 0 points 0 points  What time? 0 points 0 points 0 points 0 points  Count back from 20 0 points 0 points 0 points 0 points  Months in reverse 0 points 0 points 0 points 2 points  Repeat phrase 0 points 2 points 4 points 2 points  Total Score 0 points 2 points 4 points 4 points    Immunizations Immunization History  Administered Date(s) Administered   Influenza Whole 03/13/2010   PNEUMOCOCCAL CONJUGATE-20 07/17/2022   Td (Adult),5 Lf Tetanus Toxid, Preservative Free 07/27/2000   Tdap 03/13/2010, 07/17/2022   Zoster Recombinant(Shingrix) 04/11/2021, 07/13/2021    TDAP status: Up to date  Flu Vaccine status: Declined, Education has been provided regarding the importance of this vaccine but patient still declined. Advised may receive this vaccine at local pharmacy or  Health Dept. Aware to provide a copy of the vaccination record if obtained from local pharmacy or Health Dept. Verbalized acceptance and understanding.  Pneumococcal vaccine status: Up to date  Covid-19 vaccine status: Information provided on how to obtain vaccines.   Qualifies for Shingles Vaccine? Yes   Zostavax completed No   Shingrix Completed?: Yes  Screening Tests Health Maintenance  Topic Date Due   OPHTHALMOLOGY EXAM  05/24/2022   Medicare Annual Wellness (AWV)  01/24/2023   INFLUENZA VACCINE  06/11/2023 (Originally 10/12/2022)   Colonoscopy  07/17/2023 (Originally 08/15/1994)   COLON CANCER SCREENING ANNUAL FOBT  07/18/2023 (Originally 11/08/2018)   MAMMOGRAM  02/16/2023   DEXA SCAN  04/13/2023   HEMOGLOBIN A1C  04/25/2023   FOOT EXAM  07/17/2023   Diabetic kidney evaluation - eGFR measurement  12/11/2023  Diabetic kidney evaluation - Urine ACR  01/18/2024   DTaP/Tdap/Td (3 - Td or Tdap) 07/16/2032   Pneumonia Vaccine 7+ Years old  Completed   Hepatitis C Screening  Completed   Zoster Vaccines- Shingrix  Completed   HPV VACCINES  Aged Out   Lung Cancer Screening  Discontinued   COVID-19 Vaccine  Discontinued    Health Maintenance  Health Maintenance Due  Topic Date Due   OPHTHALMOLOGY EXAM  05/24/2022   Medicare Annual Wellness (AWV)  01/24/2023    Colorectal Cancer Screening: Patient declined colorectal cancer screening   Mammogram status: Ordered 01/31/2023. Pt provided with contact info and advised to call to schedule appt.   Bone Density status: Completed 04/12/2021. Results reflect: Bone density results: OSTEOPENIA. Repeat every 2 years.  Lung Cancer Screening: (Low Dose CT Chest recommended if Age 34-80 years, 20 pack-year currently smoking OR have quit w/in 15years.) does not qualify.   Lung Cancer Screening Referral: na  Additional Screening:  Hepatitis C Screening: does not qualify; Completed 03/12/2015  Vision Screening: Recommended annual  ophthalmology exams for early detection of glaucoma and other disorders of the eye. Is the patient up to date with their annual eye exam?  Yes  Who is the provider or what is the name of the office in which the patient attends annual eye exams? [facility] [place] If pt is not established with a provider, would they like to be referred to a provider to establish care? No .   Dental Screening: Recommended annual dental exams for proper oral hygiene  Diabetic Foot Exam: Diabetic Foot Exam: Completed 07/17/2022  Community Resource Referral / Chronic Care Management: CRR required this visit?  No   CCM required this visit?  No     Plan:     I have personally reviewed and noted the following in the patient's chart:   Medical and social history Use of alcohol, tobacco or illicit drugs  Current medications and supplements including opioid prescriptions. Patient is currently taking opioid prescriptions. Information provided to patient regarding non-opioid alternatives. Patient advised to discuss non-opioid treatment plan with their provider. Functional ability and status Nutritional status Physical activity Advanced directives List of other physicians Hospitalizations, surgeries, and ER visits in previous 12 months Vitals Screenings to include cognitive, depression, and falls Referrals and appointments  In addition, I have reviewed and discussed with patient certain preventive protocols, quality metrics, and best practice recommendations. A written personalized care plan for preventive services as well as general preventive health recommendations were provided to patient.     Jordan Hawks Kenisha Lynds, CMA   01/31/2023   After Visit Summary: (MyChart) Due to this being a telephonic visit, the after visit summary with patients personalized plan was offered to patient via MyChart   Nurse Notes: see routing comment

## 2023-02-02 ENCOUNTER — Encounter: Payer: Self-pay | Admitting: Family Medicine

## 2023-02-02 ENCOUNTER — Telehealth: Payer: Self-pay | Admitting: Family Medicine

## 2023-02-02 ENCOUNTER — Ambulatory Visit (INDEPENDENT_AMBULATORY_CARE_PROVIDER_SITE_OTHER): Payer: Medicare Other | Admitting: Family Medicine

## 2023-02-02 VITALS — BP 121/68 | HR 80 | Ht 66.0 in | Wt 169.0 lb

## 2023-02-02 DIAGNOSIS — M479 Spondylosis, unspecified: Secondary | ICD-10-CM | POA: Diagnosis not present

## 2023-02-02 DIAGNOSIS — S32010A Wedge compression fracture of first lumbar vertebra, initial encounter for closed fracture: Secondary | ICD-10-CM

## 2023-02-02 DIAGNOSIS — M5489 Other dorsalgia: Secondary | ICD-10-CM | POA: Diagnosis not present

## 2023-02-02 DIAGNOSIS — M961 Postlaminectomy syndrome, not elsewhere classified: Secondary | ICD-10-CM

## 2023-02-02 DIAGNOSIS — S22080A Wedge compression fracture of T11-T12 vertebra, initial encounter for closed fracture: Secondary | ICD-10-CM

## 2023-02-02 DIAGNOSIS — G8929 Other chronic pain: Secondary | ICD-10-CM | POA: Diagnosis not present

## 2023-02-02 NOTE — Progress Notes (Signed)
BP 121/68   Pulse 80   Ht 5\' 6"  (1.676 m)   Wt 169 lb (76.7 kg)   SpO2 95%   BMI 27.28 kg/m    Subjective:   Patient ID: Laura Dalton, female    DOB: Jul 04, 1950, 72 y.o.   MRN: [id number]  HPI: Laura Dalton is a 72 y.o. female presenting on 02/02/2023 for Request scripts for potty chair   HPI Patient has significant degenerative disc disease and is less mobile and has difficulty getting up and down from chairs and seats and concern for fall risk.  She wants a shower chair and a chair for her bathroom with them raised toilet seat and handles so she can get up and down without concern for falling.  She uses a walker to get around  Relevant past medical, surgical, family and social history reviewed and updated as indicated. Interim medical history since our last visit reviewed. Allergies and medications reviewed and updated.  Review of Systems  Constitutional:  Negative for chills and fever.  Eyes:  Negative for visual disturbance.  Respiratory:  Negative for chest tightness and shortness of breath.   Cardiovascular:  Negative for chest pain and leg swelling.  Genitourinary:  Negative for difficulty urinating and dysuria.  Musculoskeletal:  Positive for arthralgias, back pain and gait problem.  Skin:  Negative for rash.  Neurological:  Positive for weakness. Negative for light-headedness and headaches.  Psychiatric/Behavioral:  Negative for agitation and behavioral problems.   All other systems reviewed and are negative.   Per HPI unless specifically indicated above   Allergies as of 02/02/2023       Reactions   Aspirin Hives   Gabapentin Other (See Comments)   Dizzy,falls        Medication List        Accurate as of February 02, 2023 11:00 AM. If you have any questions, ask your nurse or doctor.          albuterol 108 (90 Base) MCG/ACT inhaler Commonly known as: VENTOLIN HFA 2 puffs every 6 hours as needed for wheezing or shortness of breath    alendronate 70 MG tablet Commonly known as: FOSAMAX TAKE 1 TABLET BY MOUTH WEEKLY  WITH 8 OZ OF PLAIN WATER 30  MINUTES BEFORE FIRST FOOD, DRINK OR MEDS. STAY UPRIGHT FOR 30  MINS   amitriptyline 50 MG tablet Commonly known as: ELAVIL TAKE 1 TABLET BY MOUTH AT  BEDTIME   atorvastatin 40 MG tablet Commonly known as: LIPITOR TAKE 1 TABLET BY MOUTH DAILY   calcium carbonate 1500 (600 Ca) MG Tabs tablet Commonly known as: OSCAL Take by mouth 2 (two) times daily with a meal.   clopidogrel 75 MG tablet Commonly known as: PLAVIX TAKE 1 TABLET BY MOUTH DAILY   donepezil 10 MG tablet Commonly known as: ARICEPT Take 1 tablet (10 mg total) by mouth at bedtime.   DULoxetine 60 MG capsule Commonly known as: CYMBALTA TAKE 1 CAPSULE BY MOUTH DAILY   levothyroxine 125 MCG tablet Commonly known as: SYNTHROID TAKE 1 TABLET BY MOUTH DAILY  BEFORE BREAKFAST   lisinopril 40 MG tablet Commonly known as: ZESTRIL Take 1 tablet (40 mg total) by mouth daily.   traMADol 50 MG tablet Commonly known as: ULTRAM Take 1 tablet (50 mg total) by mouth every 6 (six) hours as needed.   triamcinolone cream 0.1 % Commonly known as: KENALOG Apply 1 application topically 2 (two) times daily.  Durable Medical Equipment  (From admission, onward)           Start     Ordered   02/02/23 0000  For home use only DME Other see comment       Comments: Diagnosis degenerative disc disease and history of compression fractures. Shower chair and raised handicap toilet seat with handles  Question:  Length of Need  Answer:  Lifetime   02/02/23 1059             Objective:   BP 121/68   Pulse 80   Ht 5\' 6"  (1.676 m)   Wt 169 lb (76.7 kg)   SpO2 95%   BMI 27.28 kg/m   Wt Readings from Last 3 Encounters:  02/02/23 169 lb (76.7 kg)  01/31/23 170 lb (77.1 kg)  01/18/23 170 lb (77.1 kg)    Physical Exam Vitals and nursing note reviewed.  Constitutional:      General: She is  not in acute distress.    Appearance: Normal appearance. She is well-developed. She is not diaphoretic.     Comments: Uses a walker to get around and has a slow to get up and go test from the sitting position.  Eyes:     Conjunctiva/sclera: Conjunctivae normal.  Musculoskeletal:        General: No tenderness. Normal range of motion.  Skin:    General: Skin is warm and dry.     Findings: No rash.  Neurological:     Mental Status: She is alert and oriented to person, place, and time.     Coordination: Coordination normal.  Psychiatric:        Behavior: Behavior normal.       Assessment & Plan:   Problem List Items Addressed This Visit       Musculoskeletal and Integument   Degeneration of spine   Relevant Orders   For home use only DME Other see comment   Thoracic compression fracture (HCC)   Relevant Orders   For home use only DME Other see comment   Lumbar compression fracture (HCC)   Relevant Orders   For home use only DME Other see comment     Other   Chronic back pain - Primary   Relevant Orders   For home use only DME Other see comment   Lumbar post-laminectomy syndrome   Relevant Orders   For home use only DME Other see comment    Did face-to-face today for shower chair and raised toilet seat with handles. Follow up plan: Return if symptoms worsen or fail to improve.  Counseling provided for all of the vaccine components Orders Placed This Encounter  Procedures   For home use only DME Other see comment    Arville Care, MD [place] [facility] 02/02/2023, 11:00 AM

## 2023-02-02 NOTE — Telephone Encounter (Signed)
Pt is needing to know where her meds where called into she would like a call back regarding this

## 2023-02-06 ENCOUNTER — Ambulatory Visit: Payer: Self-pay | Admitting: *Deleted

## 2023-02-06 NOTE — Patient Outreach (Signed)
  Care Coordination   Follow Up Visit Note   02/06/2023 Name: Laura Dalton MRN: [id number] DOB: 05-13-1950  Laura Dalton is a 72 y.o. year old female who sees Dettinger, Elige Radon, MD for primary care. I spoke with  Laura Dalton by phone today.  What matters to the patients health and wellness today?  shower chair and raised toilet seat with handles not received since ordered during 02/02/23 office visit  durable medical equipment (DME)- she reports she forgot to follow up. She has never had DME with assist from her insurance company. She agrees to have RN CM to contact her pcp about this.  She also agreed to check with the [employer] in [place] for assist if needed during the Thanksgiving holiday   New Cough with congestion, "closing of throat"- She reports her grandson came in with strong cologne last night and walked in her room . She reports her symptoms were triggered and progressed.  She denies need to be seen for allergic reaction History of COPD No pulmonologist being seen, no longer smoking. She reports she can not recall being informed she "had COPD" She has Albuterol but no maintenance inhaler and can not recall having pulmonary function tests       Goals Addressed               This Visit's Progress     Patient Stated     home management of stomach (GI) symptoms, Dementia care coordination services (pt-stated)   Not on track     02/05/23 Confirmed her stomach symptoms are resolved- Completed goal Patient will receive durable medical equipment (DME) as ordered on 02/02/23 Patient will be able to manage respiratory symptoms at home with medicines and comfort measures plus seek emergency services as needed   Interventions Today    Flowsheet Row Most Recent Value  Chronic Disease   Chronic disease during today's visit Chronic Obstructive Pulmonary Disease (COPD), Other  General Interventions   General Interventions Discussed/Reviewed General  Interventions Reviewed, Durable Medical Equipment (DME), Doctor Visits, [facility], Communication with  Doctor Visits Discussed/Reviewed Doctor Visits Reviewed, PCP, Specialist  [discussed pulmonology services + pcp]  Horticulturist, commercial (DME) Shower bench, Bed side commode  [need home items, ordered not received]  PCP/Specialist Visits Compliance with follow-up visit  Communication with PCP/Specialists  [In basket message to pcp office clinical pool for DME assist]  Education Interventions   Education Provided Provided Education  [allergies & when Emergency care needed, pulmonology, action plan for allergy & COPD, [employer] [place] to get DME]  Provided Verbal Education On Medication, Sick Day Rules, Teacher, early years/pre Interventions   Pharmacy Dicussed/Reviewed Pharmacy Topics Reviewed, Medications and their functions, Affording Medications             SDOH assessments and interventions completed:  No{THN Tip this will not be part of the note when signed-REQUIRED REPORT FIELD DO NOT DELETE (Optional):27901}     Care Coordination Interventions:  Yes, provided {THN Tip this will not be part of the note when signed-REQUIRED REPORT FIELD DO NOT DELETE (Optional):27901}  Follow up plan: Follow up call scheduled for 02/13/23    Encounter Outcome:  Patient Visit Completed {THN Tip this will not be part of the note when signed-REQUIRED REPORT FIELD DO NOT DELETE (Optional):27901}   Yu Peggs L. Noelle Penner, RN, BSN, [facility]  VBCI Care Management Coordinator  [phone]  Fax: [phone]

## 2023-02-07 ENCOUNTER — Ambulatory Visit: Payer: Self-pay | Admitting: *Deleted

## 2023-02-07 DIAGNOSIS — E039 Hypothyroidism, unspecified: Secondary | ICD-10-CM | POA: Diagnosis not present

## 2023-02-07 DIAGNOSIS — R051 Acute cough: Secondary | ICD-10-CM | POA: Diagnosis not present

## 2023-02-07 DIAGNOSIS — S5012XD Contusion of left forearm, subsequent encounter: Secondary | ICD-10-CM | POA: Diagnosis not present

## 2023-02-07 DIAGNOSIS — M545 Low back pain, unspecified: Secondary | ICD-10-CM | POA: Diagnosis not present

## 2023-02-07 DIAGNOSIS — Z7902 Long term (current) use of antithrombotics/antiplatelets: Secondary | ICD-10-CM | POA: Diagnosis not present

## 2023-02-07 DIAGNOSIS — Z9181 History of falling: Secondary | ICD-10-CM | POA: Diagnosis not present

## 2023-02-07 DIAGNOSIS — E785 Hyperlipidemia, unspecified: Secondary | ICD-10-CM | POA: Diagnosis not present

## 2023-02-07 DIAGNOSIS — E1142 Type 2 diabetes mellitus with diabetic polyneuropathy: Secondary | ICD-10-CM | POA: Diagnosis not present

## 2023-02-07 DIAGNOSIS — I10 Essential (primary) hypertension: Secondary | ICD-10-CM | POA: Diagnosis not present

## 2023-02-07 DIAGNOSIS — E559 Vitamin D deficiency, unspecified: Secondary | ICD-10-CM | POA: Diagnosis not present

## 2023-02-07 DIAGNOSIS — Z8673 Personal history of transient ischemic attack (TIA), and cerebral infarction without residual deficits: Secondary | ICD-10-CM | POA: Diagnosis not present

## 2023-02-07 NOTE — Patient Instructions (Addendum)
Visit Information  Thank you for taking time to visit with me today. Please don't hesitate to contact me if I can be of assistance to you.   Following are the goals we discussed today:   Goals Addressed               This Visit's Progress     Patient Stated     home management of stomach (GI) symptoms, Dementia care coordination services (pt-stated)   Not on track     02/05/23 Confirmed her stomach symptoms are resolved- Completed goal Patient will receive durable medical equipment (DME) as ordered on 02/02/23 Patient will be able to manage respiratory symptoms at home with medicines and comfort measures plus seek emergency services as needed   Interventions Today    Flowsheet Row Most Recent Value  Chronic Disease   Chronic disease during today's visit Chronic Obstructive Pulmonary Disease (COPD), Other  General Interventions   General Interventions Discussed/Reviewed General Interventions Reviewed, Durable Medical Equipment (DME), Doctor Visits, [facility], Communication with  Doctor Visits Discussed/Reviewed Doctor Visits Reviewed, PCP, Specialist  [discussed pulmonology services + pcp]  Horticulturist, commercial (DME) Shower bench, Bed side commode  [need home items, ordered not received]  PCP/Specialist Visits Compliance with follow-up visit  Communication with PCP/Specialists  [In basket message to pcp office clinical pool for DME assist]  Education Interventions   Education Provided Provided Education  [allergies & when Emergency care needed, pulmonology, action plan for allergy & COPD, dancing [employer] [place] to get DME]  Provided Verbal Education On Medication, Sick Day Rules, [employer]  Pharmacy Interventions   Pharmacy Dicussed/Reviewed Pharmacy Topics Reviewed, Medications and their functions, Affording Medications             Our next appointment is by telephone on 02/13/23 at 1 pm  Please call the care guide team at [phone] if you need  to cancel or reschedule your appointment.   If you are experiencing a Mental Health or Behavioral Health Crisis or need someone to talk to, please call the Suicide and Crisis Lifeline: 988 call the [place] National Suicide Prevention Lifeline: 2133764776 or TTY: 4792144832 TTY 336-740-4868) to talk to a trained counselor call 1-800-273-TALK (toll free, 24 hour hotline) call the [facility]: ([phone] call 911   Patient verbalizes understanding of instructions and care plan provided today and agrees to view in MyChart. Active MyChart status and patient understanding of how to access instructions and care plan via MyChart confirmed with patient.     The patient has been provided with contact information for the care management team and has been advised to call with any health related questions or concerns.   Muaz Shorey L. Noelle Penner, RN, BSN, [facility]  VBCI Care Management Coordinator  [phone]  Fax: [phone]

## 2023-02-07 NOTE — Patient Instructions (Signed)
Visit Information  Thank you for taking time to visit with me today. Please don't hesitate to contact me if I can be of assistance to you.   Following are the goals we discussed today:   Goals Addressed               This Visit's Progress     Patient Stated     Receive home equipment, home management of stomach (GI) symptoms, Dementia -RN CM care coordination services (pt-stated)        02/05/23 Confirmed her stomach symptoms are resolved- Completed goal Patient will receive durable medical equipment (DME) as ordered on 02/02/23 Patient will be able to manage respiratory symptoms at home with medicines and comfort measures plus seek emergency services as needed   Interventions Today    Flowsheet Row Most Recent Value  Chronic Disease   Chronic disease during today's visit Other  [Equipment]  General Interventions   General Interventions Discussed/Reviewed Communication with, General Interventions Reviewed  Doctor Visits Discussed/Reviewed Doctor Visits Reviewed, PCP  Durable Medical Equipment (DME) Shower bench, Bed side commode  [Ken from pcp office confirmed that the prescriptions for patient equipment was sent to her pharmacy]  Communication with --  [spoke with [facility] staff, [facility] can not bill insurance DME]             Our next appointment is by telephone on 02/13/23 at 1 pm  Please call the care guide team at [phone] if you need to cancel or reschedule your appointment.   If you are experiencing a Mental Health or Behavioral Health Crisis or need someone to talk to, please call the Suicide and Crisis Lifeline: 988 call the [place] National Suicide Prevention Lifeline: 820-589-4694 or TTY: (856)436-4300 TTY (708)109-9642) to talk to a trained counselor call 1-800-273-TALK (toll free, 24 hour hotline) call the [facility]: [phone] call 911   Patient verbalizes understanding of instructions and care plan provided today and agrees to view  in MyChart. Active MyChart status and patient understanding of how to access instructions and care plan via MyChart confirmed with patient.     The patient has been provided with contact information for the care management team and has been advised to call with any health related questions or concerns.   Khrystina Bonnes L. Noelle Penner, RN, BSN, [facility]  VBCI Care Management Coordinator  [phone]  Fax: [phone]

## 2023-02-12 NOTE — Patient Outreach (Addendum)
  Care Coordination   Collaboration with pcp staff/DME  Visit Note   02/12/2023 Late entry for 02/07/23 Name: Laura Dalton MRN: [id number] DOB: 11-17-1950  Laura Dalton is a 72 y.o. year old female who sees Dettinger, Elige Radon, MD for primary care. I  spoke with Rocky Link at pcp office about DME for patient when he called  What matters to the patients health and wellness today?  Patient reported not receiving shower chair nor raised toilet set with handles discussed at her 02/02/23 pcp visit & ordered.   Response from an in basket message received from [place] at pcp office to state her orders had been sent to her pharmacy in [place].   Goals Addressed               This Visit's Progress     Patient Stated     Receive home equipment, home management of stomach (GI) symptoms, Dementia -RN CM care coordination services (pt-stated)        02/05/23 Confirmed her stomach symptoms are resolved- Completed goal Patient will receive durable medical equipment (DME) as ordered on 02/02/23 Patient will be able to manage respiratory symptoms at home with medicines and comfort measures plus seek emergency services as needed   Interventions Today    Flowsheet Row Most Recent Value  Chronic Disease   Chronic disease during today's visit Other  [Equipment]  General Interventions   General Interventions Discussed/Reviewed Communication with, General Interventions Reviewed  Doctor Visits Discussed/Reviewed Doctor Visits Reviewed, PCP  Durable Medical Equipment (DME) Shower bench, Bed side commode  [Ken from pcp office confirmed that the prescriptions for patient equipment was sent to her pharmacy]  Communication with --  [spoke with [facility] staff, [facility] can not bill insurance DME]             SDOH assessments and interventions completed:  No     Care Coordination Interventions:  Yes, provided   Follow up plan: Follow up call scheduled for 02/13/23    Encounter Outcome:  Patient Visit  Completed   [name], RN, BSN, [facility]  VBCI Care Management Coordinator  [phone]  Fax: [phone]

## 2023-02-13 ENCOUNTER — Telehealth: Payer: Self-pay | Admitting: Family Medicine

## 2023-02-13 ENCOUNTER — Ambulatory Visit: Payer: Self-pay | Admitting: *Deleted

## 2023-02-13 NOTE — Telephone Encounter (Signed)
-----   Message from Nurse Claretha Cooper sent at 02/13/2023  1:38 PM EST ----- Regarding: renewal of tramadol refills needed Just spoke with patient who states only has 3 tramadol tabs left. Request refill. Reports taking 1 pill a day for back pain Laura L. Noelle Penner, RN, BSN, [facility] VBCI Care Management Coordinator  [phone]  Fax: [phone]

## 2023-02-13 NOTE — Patient Instructions (Signed)
Visit Information  Thank you for taking time to visit with me today. Please don't hesitate to contact me if I can be of assistance to you.   Following are the goals we discussed today:   Goals Addressed               This Visit's Progress     Patient Stated     Receive home equipment, home management of stomach (GI) symptoms, Dementia -RN CM care coordination services (pt-stated)        02/05/23 Confirmed her stomach symptoms are resolved- Completed goal Patient will receive durable medical equipment (DME) as ordered on 02/02/23- 02/13/23 DME not received at this time Patient will be able to manage respiratory symptoms at home with medicines and comfort measures plus seek emergency services as needed - 02/13/23 Respiratory symptoms within normal limits   Interventions Today    Flowsheet Row Most Recent Value  Chronic Disease   Chronic disease during today's visit --  [DME, back pain, conference calls]  General Interventions   General Interventions Discussed/Reviewed Durable Medical Equipment (DME), Communication with, [facility], General Interventions Reviewed  Durable Medical Equipment (DME) Bed side commode, Shower bench  [call to [employer] with patient she was informed of the $55 out of pocket cost for the shower bench. She wrote down the dancing goat address & contact number again]  Communication with Pharmacists, PCP/Specialists  Nicholes Stairs to [employer] (no Prescriptions, No fill for medicare patients) call to [employer] (jordan)]  Exercise Interventions   Exercise Discussed/Reviewed Physical Activity, Exercise Reviewed  [using her walker]  Education Interventions   Education Provided Provided Education  [equipment can not be filed via pharmacies, [employer] services, [employer] services,]  Provided Verbal Education On Medication, Development worker, community, Teacher, early years/pre Interventions   Pharmacy Dicussed/Reviewed Pharmacy Topics Reviewed, Affording Medications,  Medications and their functions  Safety Interventions   Safety Discussed/Reviewed Safety Reviewed, Fall Risk, Home Safety  Home Safety Assistive Devices  [using her walker]             Our next appointment is by telephone on 02/20/23 at 1 pm  Please call the care guide team at [phone] if you need to cancel or reschedule your appointment.   If you are experiencing a Mental Health or Behavioral Health Crisis or need someone to talk to, please call the Suicide and Crisis Lifeline: 988 call the [place] National Suicide Prevention Lifeline: (757) 536-8409 or TTY: 440-862-6199 TTY 505-867-9558) to talk to a trained counselor call 1-800-273-TALK (toll free, 24 hour hotline) call the [facility]: [phone] call 911   Patient verbalizes understanding of instructions and care plan provided today and agrees to view in MyChart. Active MyChart status and patient understanding of how to access instructions and care plan via MyChart confirmed with patient.     The patient has been provided with contact information for the care management team and has been advised to call with any health related questions or concerns.    Laura Dalton L. Noelle Penner, RN, BSN, [facility]  VBCI Care Management Coordinator  [phone]  Fax: [phone]

## 2023-02-13 NOTE — Patient Outreach (Signed)
  Care Coordination   Follow Up Visit Note   02/13/2023 Name: Laura Dalton MRN: [id number] DOB: 19-Nov-1950  Laura Dalton is a 72 y.o. year old female who sees Dettinger, Elige Radon, MD for primary care. I spoke with  Laura Dalton by phone today.  What matters to the patients health and wellness today?  Therapist, art & beside commode not received  Patient provided permission for conference calls to [facility] pharmacy & [employer] She voiced understanding of RN CM collaboration with pcp staff last week ending in request to send prescriptions to [facility] She voiced understanding from [place] at [facility] that her shower bench is an out of pocket expense of $55 + he will have the shower bench & bedside commode sent to her insurance carrier.  He confirmed her number for a return call from [employer].   Back pain- pain level 8-9 sharp pain have only 3 tramadol tabs left requests a refill from pcp She is taking Tramadol once a day per patient for pain level 8-9 sharp back pain    Goals Addressed               This Visit's Progress     Patient Stated     Receive home equipment, home management of stomach (GI) symptoms, Dementia -RN CM care coordination services (pt-stated)        02/05/23 Confirmed her stomach symptoms are resolved- Completed goal Patient will receive durable medical equipment (DME) as ordered on 02/02/23- 02/13/23 DME not received at this time Patient will be able to manage respiratory symptoms at home with medicines and comfort measures plus seek emergency services as needed - 02/13/23 Respiratory symptoms within normal limits   Interventions Today    Flowsheet Row Most Recent Value  Chronic Disease   Chronic disease during today's visit --  [DME, back pain, conference calls]  General Interventions   General Interventions Discussed/Reviewed Durable Medical Equipment (DME), Communication with, [facility], General Interventions Reviewed  Durable Medical  Equipment (DME) Bed side commode, Shower bench  [call to [employer] with patient she was informed of the $55 out of pocket cost for the shower bench. She wrote down the [employer] address & contact number again]  Communication with Pharmacists, PCP/Specialists  Nicholes Stairs to [employer] (no Prescriptions, No fill for medicare patients) call to [employer] (jordan)]  Exercise Interventions   Exercise Discussed/Reviewed Physical Activity, Exercise Reviewed  [using her walker]  Education Interventions   Education Provided Provided Education  [equipment can not be filed via pharmacies, [employer] services, [employer] services,]  Provided Verbal Education On Medication, Development worker, community, Teacher, early years/pre Interventions   Pharmacy Dicussed/Reviewed Pharmacy Topics Reviewed, Affording Medications, Medications and their functions  Safety Interventions   Safety Discussed/Reviewed Safety Reviewed, Fall Risk, Home Safety  Home Safety Assistive Devices  [using her walker]             SDOH assessments and interventions completed:  No     Care Coordination Interventions:  Yes, provided   Follow up plan: Follow up call scheduled for 02/20/23    Encounter Outcome:  Patient Visit Completed   Laura Bradford L. Noelle Penner, RN, BSN, [facility]  VBCI Care Management Coordinator  [phone]  Fax: [phone]

## 2023-02-13 NOTE — Telephone Encounter (Signed)
Patient has a refill at the pharmacy. Called to let patient know

## 2023-02-14 DIAGNOSIS — Z8673 Personal history of transient ischemic attack (TIA), and cerebral infarction without residual deficits: Secondary | ICD-10-CM | POA: Diagnosis not present

## 2023-02-14 DIAGNOSIS — I10 Essential (primary) hypertension: Secondary | ICD-10-CM | POA: Diagnosis not present

## 2023-02-14 DIAGNOSIS — E559 Vitamin D deficiency, unspecified: Secondary | ICD-10-CM | POA: Diagnosis not present

## 2023-02-14 DIAGNOSIS — M545 Low back pain, unspecified: Secondary | ICD-10-CM | POA: Diagnosis not present

## 2023-02-14 DIAGNOSIS — R051 Acute cough: Secondary | ICD-10-CM | POA: Diagnosis not present

## 2023-02-14 DIAGNOSIS — S5012XD Contusion of left forearm, subsequent encounter: Secondary | ICD-10-CM | POA: Diagnosis not present

## 2023-02-14 DIAGNOSIS — Z7902 Long term (current) use of antithrombotics/antiplatelets: Secondary | ICD-10-CM | POA: Diagnosis not present

## 2023-02-14 DIAGNOSIS — E785 Hyperlipidemia, unspecified: Secondary | ICD-10-CM | POA: Diagnosis not present

## 2023-02-14 DIAGNOSIS — E1142 Type 2 diabetes mellitus with diabetic polyneuropathy: Secondary | ICD-10-CM | POA: Diagnosis not present

## 2023-02-14 DIAGNOSIS — E039 Hypothyroidism, unspecified: Secondary | ICD-10-CM | POA: Diagnosis not present

## 2023-02-14 DIAGNOSIS — Z9181 History of falling: Secondary | ICD-10-CM | POA: Diagnosis not present

## 2023-02-14 NOTE — Telephone Encounter (Signed)
Pt made aware that Tramadol has to be filled inside of an OV. She does not want to schedule an appt at this time. States that she will take the "knock off" brand until she is seen again.

## 2023-02-14 NOTE — Telephone Encounter (Signed)
-----   Message from [place] A Dettinger sent at 02/14/2023 11:19 AM EST ----- Regarding: FW: renewal of tramadol refills needed We did give her some tramadol for her back pain.  It was not meant to be continued long-term but if she does feel like she needs some more of it then she will need an appointment because this medicine cannot be filled outside of a visit because it is controlled. ----- Message ----- From: Clinton Gallant, RN Sent: 02/13/2023   1:40 PM EST To: Elige Radon Dettinger, MD; Fred[facility] Romans Clinical Subject: renewal of tramadol refills needed             Just spoke with patient who states only has 3 tramadol tabs left. Request refill. Reports taking 1 pill a day for back pain Laura L. Noelle Penner, RN, BSN, [facility] VBCI Care Management Coordinator  [phone]  Fax: [phone]

## 2023-02-20 ENCOUNTER — Ambulatory Visit: Payer: Self-pay | Admitting: *Deleted

## 2023-02-20 NOTE — Patient Instructions (Signed)
Visit Information  Thank you for taking time to visit with me today. Please don't hesitate to contact me if I can be of assistance to you.   Following are the goals we discussed today:   Goals Addressed               This Visit's Progress     Patient Stated     Receive home equipment, home management of stomach (GI) symptoms, Dementia -RN CM care coordination services (pt-stated)   Not on track     02/05/23 Confirmed her stomach symptoms are resolved- Completed goal Patient will receive durable medical equipment (DME) as ordered on 02/02/23- 02/13/23 DME not received at this time 02/20/23 patient still does not have her DME   Patient will be able to manage respiratory symptoms at home with medicines and comfort measures plus seek emergency services as needed - 02/13/23 Respiratory symptoms within normal limits 02/19/22 no worsening respiratory symptoms   Interventions Today    Flowsheet Row Most Recent Value  Chronic Disease   Chronic disease during today's visit Other  [refill of tramadol (originally ordreed by Dr Ophelia Charter in May 2024) and bedside commode from [employer] as not able to afford $55 bedside commode + $65 shower bench]  General Interventions   General Interventions Discussed/Reviewed General Interventions Reviewed, Durable Medical Equipment (DME), Communication with, Doctor Visits, Community Resources  Doctor Visits Discussed/Reviewed Doctor Visits Reviewed, PCP, Specialist  Durable Medical Equipment (DME) Bed side commode  [patient preference is for a bedside commode.She reports she can do without a shower bench or use the bedside commode as shower chair]  PCP/Specialist Visits Compliance with follow-up visit  Communication with PCP/Specialists, RN  Nicholes Stairs with patient to [facility]. Message left. To [facility] [place] [facility] to get clarity from Megan related to Tramadol renewal/refill needs. Secure chat with pcp, CMAs about Tramadol.]  Exercise Interventions    Exercise Discussed/Reviewed Exercise Reviewed, Physical Activity, Assistive device use and maintanence  [decrease mobiity confirm she has to get someone to drive her to pharmacy, [employer], pcp]  Physical Activity Discussed/Reviewed Physical Activity Reviewed, Home Exercise Program (HEP)  Education Interventions   Education Provided Provided Education  [process to renew/refill narcotic, Tramadol per pharmacy guidelines, Re reviewed the [employer]. RN CM has discussed this with her x 2 previously but she could not recall this today (?)]  Provided Verbal Education On Medication, When to see the doctor, [facility], Other  Mental Health Interventions   Mental Health Discussed/Reviewed Mental Health Reviewed, Coping Strategies  Pharmacy Interventions   Pharmacy Dicussed/Reviewed Pharmacy Topics Reviewed, Medications and their functions, Medication Adherence, Affording Medications  Medication Adherence Unable to refill medication  [needing to refill Tramadol orginally ordered in May 2024 by Dr Ophelia Charter which she does not plan to see in near future]  Safety Interventions   Safety Discussed/Reviewed Safety Reviewed, Fall Risk, Home Safety  Home Safety Assistive Devices             Our next appointment is by telephone on 03/23/23 at 1130  Please call the care guide team at [phone] if you need to cancel or reschedule your appointment.   If you are experiencing a Mental Health or Behavioral Health Crisis or need someone to talk to, please call the Suicide and Crisis Lifeline: 988 call the [place] National Suicide Prevention Lifeline: 863 074 4047 or TTY: 918-322-7856 TTY 870-028-7903) to talk to a trained counselor call 1-800-273-TALK (toll free, 24 hour hotline) call the [facility]: (520) [phone] call  911   Patient verbalizes understanding of instructions and care plan provided today and agrees to view in MyChart. Active MyChart status and patient understanding  of how to access instructions and care plan via MyChart confirmed with patient.     The patient has been provided with contact information for the care management team and has been advised to call with any health related questions or concerns.   Jaceon Heiberger L. Noelle Penner, RN, BSN, [facility]  VBCI Care Management Coordinator  [phone]  Fax: [phone]

## 2023-02-20 NOTE — Patient Outreach (Signed)
Care Coordination   Follow Up Visit Note   02/20/2023 Name: Laura Dalton MRN: [id number] DOB: 1950-12-23  Laura Dalton is a 72 y.o. year old female who sees Dettinger, Elige Radon, MD for primary care. I spoke with  Laura Dalton by phone today.  What matters to the patients health and wellness today?  Has not received her bedside commode nor tramadol. Reports she is not able at this time to get someone to take her to the [facility] [place] nor to [facility] in [place] Patient provide permission for RN CM to outreach with her to the [facility], and her pcp/pcp CMA Megan at [facility]) states patient will need a new prescription from either Dr Ophelia Charter or Dettinger and believes she would need to be seen by Dr Ophelia Charter again as the first prescription was written in May 2024. Patient has recently been seen by pcp + on 02/13/24 pcp CMA responded that a call was made to let patient no there are refills at the pharmacy.  Questioning which pharmacy as [facility] [place]. Patient clarified she uses the [facility] in [place] .    After speaking with Aundra Millet, Laura Dalton reports she can continue to take her Ibuprofen and wait to be seen if needed by pcp. Her next pcp visit is listed in November 2025.  Patient received a call while on the line with RN CM from her pcp office and concluded our outreach.     Goals Addressed               This Visit's Progress     Patient Stated     Receive home equipment, home management of stomach (GI) symptoms, Dementia -RN CM care coordination services (pt-stated)   Not on track     02/05/23 Confirmed her stomach symptoms are resolved- Completed goal Patient will receive durable medical equipment (DME) as ordered on 02/02/23- 02/13/23 DME not received at this time 02/20/23 patient still does not have her DME   Patient will be able to manage respiratory symptoms at home with medicines and comfort measures plus seek emergency  services as needed - 02/13/23 Respiratory symptoms within normal limits 02/19/22 no worsening respiratory symptoms   Interventions Today    Flowsheet Row Most Recent Value  Chronic Disease   Chronic disease during today's visit Other  [refill of tramadol (originally ordreed by Dr Ophelia Charter in May 2024) and [facility]ode from [facility] as not able to afford $55 bedside commode + $65 shower bench]  General Interventions   General Interventions Discussed/Reviewed General Interventions Reviewed, Durable Medical Equipment (DME), Communication with, Doctor Visits, Community Resources  Doctor Visits Discussed/Reviewed Doctor Visits Reviewed, PCP, Specialist  Durable Medical Equipment (DME) Bed side commode  [patient preference is for a bedside commode.She reports she can do without a shower bench or use the bedside commode as shower chair]  PCP/Specialist Visits Compliance with follow-up visit  Communication with PCP/Specialists, RN  Nicholes Stairs with [facility] to [facility]. [facility] [place] [facility] to get clarity from Megan related to Tramadol renewal/refill needs. Secure chat with pcp, CMAs about Tramadol.]  Exercise Interventions   Exercise Discussed/Reviewed Exercise Reviewed, Physical Activity, Assistive device use and maintanence  [decrease mobiity confirm she has to get someone to drive her to pharmacy, [facility], pcp]  Physical Activity Discussed/Reviewed Physical Activity Reviewed, Home Exercise Program (HEP)  Education Interventions   Education Provided Provided Education  [process to renew/refill narcotic,  Tramadol per pharmacy guidelines, Re reviewed the [facility]. RN CM has discussed this with her x 2 previously but she could not recall this today (?)]  Provided Verbal Education On Medication, When to see the doctor, [facility], Other  Mental Health Interventions   Mental Health Discussed/Reviewed Mental Health Reviewed, Coping Strategies  Pharmacy  Interventions   Pharmacy Dicussed/Reviewed Pharmacy Topics Reviewed, Medications and their functions, Medication Adherence, Affording Medications  Medication Adherence Unable to refill medication  [needing to refill Tramadol orginally ordered in May 2024 by Dr Ophelia Charter which she does not plan to see in near future]  Safety Interventions   Safety Discussed/Reviewed Safety Reviewed, Fall Risk, Home Safety  Home Safety Assistive Devices             SDOH assessments and interventions completed:  Yes  SDOH Interventions Today    Flowsheet Row Most Recent Value  SDOH Interventions   Transportation Interventions Intervention Not Indicated, Other (Comment)  [gets assistance from others/daughter in law to get medicines from pharmacy, [facility]]        Care Coordination Interventions:  Yes, provided   Follow up plan: Follow up call scheduled for 03/23/23    Encounter Outcome:  Patient Visit Completed   [name], RN, BSN, [facility]  VBCI Care Management Coordinator  [phone]  Fax: [phone]

## 2023-02-21 ENCOUNTER — Encounter: Payer: Self-pay | Admitting: *Deleted

## 2023-02-22 DIAGNOSIS — E039 Hypothyroidism, unspecified: Secondary | ICD-10-CM | POA: Diagnosis not present

## 2023-02-22 DIAGNOSIS — S5012XD Contusion of left forearm, subsequent encounter: Secondary | ICD-10-CM | POA: Diagnosis not present

## 2023-02-22 DIAGNOSIS — E1142 Type 2 diabetes mellitus with diabetic polyneuropathy: Secondary | ICD-10-CM | POA: Diagnosis not present

## 2023-02-22 DIAGNOSIS — E559 Vitamin D deficiency, unspecified: Secondary | ICD-10-CM | POA: Diagnosis not present

## 2023-02-22 DIAGNOSIS — I10 Essential (primary) hypertension: Secondary | ICD-10-CM | POA: Diagnosis not present

## 2023-02-22 DIAGNOSIS — Z9181 History of falling: Secondary | ICD-10-CM | POA: Diagnosis not present

## 2023-02-22 DIAGNOSIS — Z7902 Long term (current) use of antithrombotics/antiplatelets: Secondary | ICD-10-CM | POA: Diagnosis not present

## 2023-02-22 DIAGNOSIS — M545 Low back pain, unspecified: Secondary | ICD-10-CM | POA: Diagnosis not present

## 2023-02-22 DIAGNOSIS — R051 Acute cough: Secondary | ICD-10-CM | POA: Diagnosis not present

## 2023-02-22 DIAGNOSIS — E785 Hyperlipidemia, unspecified: Secondary | ICD-10-CM | POA: Diagnosis not present

## 2023-02-22 DIAGNOSIS — Z8673 Personal history of transient ischemic attack (TIA), and cerebral infarction without residual deficits: Secondary | ICD-10-CM | POA: Diagnosis not present

## 2023-03-02 ENCOUNTER — Inpatient Hospital Stay: Admission: RE | Admit: 2023-03-02 | Payer: Medicare Other | Source: Ambulatory Visit

## 2023-03-23 ENCOUNTER — Ambulatory Visit: Payer: Self-pay | Admitting: *Deleted

## 2023-03-23 NOTE — Patient Outreach (Signed)
 Care Coordination   Follow Up Visit Note   03/24/2023 Name: Laura Dalton MRN: [id number] DOB: 10/12/1950  Laura Dalton is a 73 y.o. year old female who sees Dettinger, Fonda LABOR, MD for primary care. I spoke with  Laura Dalton by phone today.  What matters to the patients health and wellness today?  Still does not have a bedside commode nor tramadol   She has not been able to [facility] to [facility] a [facility]ode  No time to see MD to get assist with Tramadol   She confirms she is managing her pain with Ibuprofen  600 mg well She confirms it is working well as she is up today cleaning at intervals + she was able to completed canning food on 03/22/23   She reports she is able to notice when she gets tired as her legs begin to shake This prompts her to rest.  Hypertension (HTN) - doing well denies elevations or hypotension values  Diabetes not monitoring as her HgA1c has been within normal limit.  don't think I had diabetes  Back pain is manageable Can not walk with out her walker  She reports that her daughter states she does not use her walker properly  She confirms she has pain when standing straight ands using her walker Voiced understanding contractures, spine curvatures & need to keep the muscles flexible with exercised taught during her home health therapy sessions. She agreed to work on this   Goals Addressed               This Visit's Progress     Patient Stated     Receive home equipment, home management of  Dementia/memory changes -RN CM care coordination services (pt-stated)        Patient will obtain bedside commode from the [facility] or equipment store Patient will continue to manage her pain at home Patient will continue to use devices at home to maintain her memory changes Patienr reports resolution of stomach symptoms (goal met)   Interventions Today    Flowsheet Row Most Recent Value  Chronic Disease   Chronic disease during  today's visit Diabetes, Hypertension (HTN), Other  [back pain, DME,]  General Interventions   General Interventions Discussed/Reviewed General Interventions Reviewed, Durable Medical Equipment (DME), Doctor Visits  Doctor Visits Discussed/Reviewed Doctor Visits Reviewed, PCP  Durable Medical Equipment (DME) Bed side commode  [confirmed still has not obtained a bedside commode but has the infomration on how to obtain one via the [facility]]  PCP/Specialist Visits Compliance with follow-up visit  Exercise Interventions   Exercise Discussed/Reviewed Exercise Reviewed, Physical Activity, Assistive device use and maintanence  Physical Activity Discussed/Reviewed Physical Activity Reviewed, Types of exercise, Home Exercise Program (HEP)  Education Interventions   Education Provided Provided Web-based Education, Provided Education  [Mild neurocognitive disorder, fall prevention in the home, adult, confusion, health maintenance after age 68]  Provided Verbal Education On Other, Medication  Mental Health Interventions   Mental Health Discussed/Reviewed Mental Health Reviewed, Coping Strategies  Pharmacy Interventions   Pharmacy Dicussed/Reviewed Pharmacy Topics Reviewed, Medications and their functions, Affording Medications  Safety Interventions   Safety Discussed/Reviewed Safety Reviewed, Fall Risk, Home Safety  Home Safety Assistive Devices                SDOH assessments and interventions completed:  No     Care Coordination Interventions:  Yes, provided   Follow up plan: Follow up call scheduled for 06/21/23 1130  Encounter Outcome:  Patient Visit Completed     Suzen L. Ramonita, RN, BSN, [facility]  VBCI Care Management Coordinator  [phone]  Fax: [phone]

## 2023-03-23 NOTE — Patient Instructions (Addendum)
 Visit Information  Thank you for taking time to visit with me today. Please don't hesitate to contact me if I can be of assistance to you.   Following are the goals we discussed today:   Goals Addressed               This Visit's Progress     Patient Stated     Receive home equipment, home management of  Dementia/memory changes -RN CM care coordination services (pt-stated)   On track     Patient will obtain bedside commode from the [facility] or equipment store Patient will continue to manage her pain at home Patient will continue to use devices at home to maintain her memory changes Patienr reports resolution of stomach symptoms (goal met)   Interventions Today    Flowsheet Row Most Recent Value  Chronic Disease   Chronic disease during today's visit Diabetes, Hypertension (HTN), Other  [back pain, DME,]  General Interventions   General Interventions Discussed/Reviewed General Interventions Reviewed, Durable Medical Equipment (DME), Doctor Visits  Doctor Visits Discussed/Reviewed Doctor Visits Reviewed, PCP  Durable Medical Equipment (DME) Bed side commode  [confirmed still has not obtained a bedside commode but has the infomration on how to obtain one via the [facility]]  PCP/Specialist Visits Compliance with follow-up visit  Exercise Interventions   Exercise Discussed/Reviewed Exercise Reviewed, Physical Activity, Assistive device use and maintanence  Physical Activity Discussed/Reviewed Physical Activity Reviewed, Types of exercise, Home Exercise Program (HEP)  Education Interventions   Education Provided Provided Web-based Education, Provided Education  [Mild neurocognitive disorder, fall prevention in the home, adult, confusion, health maintenance after age 79]  Provided Verbal Education On Other, Medication  Mental Health Interventions   Mental Health Discussed/Reviewed Mental Health Reviewed, Coping Strategies  Pharmacy Interventions   Pharmacy Dicussed/Reviewed  Pharmacy Topics Reviewed, Medications and their functions, Affording Medications  Safety Interventions   Safety Discussed/Reviewed Safety Reviewed, Fall Risk, Home Safety  Home Safety Assistive Devices                Our next appointment is by telephone on 06/21/23 at 1130  Please call the care guide team at [phone] if you need to cancel or reschedule your appointment.   If you are experiencing a Mental Health or Behavioral Health Crisis or need someone to talk to, please call the Suicide and Crisis Lifeline: 988 call the [place]  National Suicide Prevention Lifeline: 639-569-4997 or TTY: (716)134-9962 TTY (847)718-5903) to talk to a trained counselor call 1-800-273-TALK (toll free, 24 hour hotline) call the [facility]: [phone] call 911   Patient verbalizes understanding of instructions and care plan provided today and agrees to view in MyChart. Active MyChart status and patient understanding of how to access instructions and care plan via MyChart confirmed with patient.     The patient has been provided with contact information for the care management team and has been advised to call with any health related questions or concerns.   Kelton Bultman L. Ramonita, RN, BSN, [facility]  VBCI Care Management Coordinator  [phone]  Fax: [phone]

## 2023-04-19 ENCOUNTER — Telehealth: Payer: Self-pay | Admitting: *Deleted

## 2023-04-19 NOTE — Telephone Encounter (Signed)
 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

## 2023-04-19 NOTE — Telephone Encounter (Signed)
 Yes she does need to call and get an appointment with her ophthalmologist.  If she says that she cannot and needs a referral then please place referral to ophthalmology for her, diagnosis macular degeneration

## 2023-05-31 ENCOUNTER — Other Ambulatory Visit: Payer: Self-pay

## 2023-05-31 ENCOUNTER — Encounter (HOSPITAL_COMMUNITY): Payer: Self-pay | Admitting: *Deleted

## 2023-05-31 ENCOUNTER — Emergency Department (HOSPITAL_COMMUNITY)

## 2023-05-31 ENCOUNTER — Observation Stay (HOSPITAL_COMMUNITY)
Admission: EM | Admit: 2023-05-31 | Discharge: 2023-06-02 | Disposition: A | Discharge status: Home Health | Attending: Internal Medicine | Admitting: Internal Medicine

## 2023-05-31 DIAGNOSIS — Z87891 Personal history of nicotine dependence: Secondary | ICD-10-CM | POA: Insufficient documentation

## 2023-05-31 DIAGNOSIS — S32592A Other specified fracture of left pubis, initial encounter for closed fracture: Principal | ICD-10-CM

## 2023-05-31 DIAGNOSIS — Z8673 Personal history of transient ischemic attack (TIA), and cerebral infarction without residual deficits: Secondary | ICD-10-CM | POA: Insufficient documentation

## 2023-05-31 DIAGNOSIS — E1165 Type 2 diabetes mellitus with hyperglycemia: Secondary | ICD-10-CM | POA: Diagnosis not present

## 2023-05-31 DIAGNOSIS — W19XXXA Unspecified fall, initial encounter: Secondary | ICD-10-CM | POA: Diagnosis not present

## 2023-05-31 DIAGNOSIS — I10 Essential (primary) hypertension: Secondary | ICD-10-CM

## 2023-05-31 DIAGNOSIS — Z79899 Other long term (current) drug therapy: Secondary | ICD-10-CM | POA: Diagnosis not present

## 2023-05-31 DIAGNOSIS — S32512A Fracture of superior rim of left pubis, initial encounter for closed fracture: Secondary | ICD-10-CM | POA: Diagnosis not present

## 2023-05-31 DIAGNOSIS — E039 Hypothyroidism, unspecified: Secondary | ICD-10-CM

## 2023-05-31 DIAGNOSIS — M549 Dorsalgia, unspecified: Secondary | ICD-10-CM | POA: Insufficient documentation

## 2023-05-31 DIAGNOSIS — Z7902 Long term (current) use of antithrombotics/antiplatelets: Secondary | ICD-10-CM | POA: Insufficient documentation

## 2023-05-31 DIAGNOSIS — Z7982 Long term (current) use of aspirin: Secondary | ICD-10-CM | POA: Diagnosis not present

## 2023-05-31 DIAGNOSIS — E782 Mixed hyperlipidemia: Secondary | ICD-10-CM | POA: Diagnosis not present

## 2023-05-31 DIAGNOSIS — M25552 Pain in left hip: Secondary | ICD-10-CM | POA: Diagnosis present

## 2023-05-31 DIAGNOSIS — S32502A Unspecified fracture of left pubis, initial encounter for closed fracture: Secondary | ICD-10-CM | POA: Diagnosis not present

## 2023-05-31 LAB — CBC WITH DIFFERENTIAL/PLATELET
Abs Immature Granulocytes: 0.07 10*3/uL (ref 0.00–0.07)
Basophils Absolute: 0 10*3/uL (ref 0.0–0.1)
Basophils Relative: 0 %
Eosinophils Absolute: 0.3 10*3/uL (ref 0.0–0.5)
Eosinophils Relative: 2 %
HCT: 42.9 % (ref 36.0–46.0)
Hemoglobin: 14 g/dL (ref 12.0–15.0)
Immature Granulocytes: 0 %
Lymphocytes Relative: 8 %
Lymphs Abs: 1.3 10*3/uL (ref 0.7–4.0)
MCH: 27.7 pg (ref 26.0–34.0)
MCHC: 32.6 g/dL (ref 30.0–36.0)
MCV: 85 fL (ref 80.0–100.0)
Monocytes Absolute: 1.5 10*3/uL — ABNORMAL HIGH (ref 0.1–1.0)
Monocytes Relative: 9 %
Neutro Abs: 13.1 10*3/uL — ABNORMAL HIGH (ref 1.7–7.7)
Neutrophils Relative %: 81 %
Platelets: 305 10*3/uL (ref 150–400)
RBC: 5.05 MIL/uL (ref 3.87–5.11)
RDW: 13.2 % (ref 11.5–15.5)
WBC: 16.3 10*3/uL — ABNORMAL HIGH (ref 4.0–10.5)
nRBC: 0 % (ref 0.0–0.2)

## 2023-05-31 LAB — BASIC METABOLIC PANEL
Anion gap: 11 (ref 5–15)
BUN: 8 mg/dL (ref 8–23)
CO2: 26 mmol/L (ref 22–32)
Calcium: 9.5 mg/dL (ref 8.9–10.3)
Chloride: 97 mmol/L — ABNORMAL LOW (ref 98–111)
Creatinine, Ser: 0.66 mg/dL (ref 0.44–1.00)
GFR, Estimated: >60 mL/min (ref >60)
Glucose, Bld: 129 mg/dL — ABNORMAL HIGH (ref 70–99)
Potassium: 3.3 mmol/L — ABNORMAL LOW (ref 3.5–5.1)
Sodium: 134 mmol/L — ABNORMAL LOW (ref 135–145)

## 2023-05-31 MED ORDER — HYDROMORPHONE HCL 1 MG/ML IJ SOLN
0.5000 mg | Freq: Once | INTRAMUSCULAR | Status: AC
  Administered 2023-05-31: 0.5 mg via INTRAVENOUS
  Filled 2023-05-31: qty 0.5

## 2023-05-31 NOTE — ED Provider Notes (Signed)
 [place] EMERGENCY DEPARTMENT AT [facility] Provider Note   CSN: [id number] Arrival date & time: 05/31/23  1800     History {Add pertinent medical, surgical, social history, OB history to HPI:1} Chief Complaint  Patient presents with   Laura Dalton is a 73 y.o. female.  Patient with a history of hypertension and diabetes.  She fell and complains of left hip and lower back pain   Fall       Home Medications Prior to Admission medications   Medication Sig Start Date End Date Taking? Authorizing Provider  albuterol (VENTOLIN HFA) 108 (90 Base) MCG/ACT inhaler 2 puffs every 6 hours as needed for wheezing or shortness of breath 10/23/22   Dettinger, Elige Radon, MD  alendronate (FOSAMAX) 70 MG tablet TAKE 1 TABLET BY MOUTH WEEKLY  WITH 8 OZ OF PLAIN WATER 30  MINUTES BEFORE FIRST FOOD, DRINK OR MEDS. STAY UPRIGHT FOR 30  MINS 01/29/23   Dettinger, Elige Radon, MD  amitriptyline (ELAVIL) 50 MG tablet TAKE 1 TABLET BY MOUTH AT  BEDTIME 01/29/23   Dettinger, Elige Radon, MD  atorvastatin (LIPITOR) 40 MG tablet TAKE 1 TABLET BY MOUTH DAILY 01/29/23   Dettinger, Elige Radon, MD  calcium carbonate (OSCAL) 1500 (600 Ca) MG TABS tablet Take by mouth 2 (two) times daily with a meal.    [provider]  clopidogrel (PLAVIX) 75 MG tablet TAKE 1 TABLET BY MOUTH DAILY 01/29/23   Dettinger, Elige Radon, MD  donepezil (ARICEPT) 10 MG tablet Take 1 tablet (10 mg total) by mouth at bedtime. 01/18/23   Dettinger, Elige Radon, MD  DULoxetine (CYMBALTA) 60 MG capsule TAKE 1 CAPSULE BY MOUTH DAILY 01/29/23   Dettinger, Elige Radon, MD  levothyroxine (SYNTHROID) 125 MCG tablet TAKE 1 TABLET BY MOUTH DAILY  BEFORE BREAKFAST 01/29/23   Dettinger, Elige Radon, MD  lisinopril (ZESTRIL) 40 MG tablet Take 1 tablet (40 mg total) by mouth daily. 10/23/22   Dettinger, Elige Radon, MD  traMADol (ULTRAM) 50 MG tablet Take 1 tablet (50 mg total) by mouth every 6 (six) hours as needed. 01/18/23   Dettinger,  Elige Radon, MD  triamcinolone cream (KENALOG) 0.1 % Apply 1 application topically 2 (two) times daily. 08/01/18   Dettinger, Elige Radon, MD      Allergies    Aspirin and Gabapentin    Review of Systems   Review of Systems  Physical Exam Updated Vital Signs BP 123/67   Pulse 83   Temp 98.3 F (36.8 C) (Oral)   Resp 18   Ht 5\' 6"  (1.676 m)   Wt 74.8 kg   SpO2 92%   BMI 26.63 kg/m  Physical Exam  ED Results / Procedures / Treatments   Labs (all labs ordered are listed, but only abnormal results are displayed) Labs Reviewed  CBC WITH DIFFERENTIAL/PLATELET - Abnormal; Notable for the following components:      Result Value   WBC 16.3 (*)    Neutro Abs 13.1 (*)    Monocytes Absolute 1.5 (*)    All other components within normal limits  BASIC METABOLIC PANEL - Abnormal; Notable for the following components:   Sodium 134 (*)    Potassium 3.3 (*)    Chloride 97 (*)    Glucose, Bld 129 (*)    All other components within normal limits    EKG None  Radiology DG Hip Unilat W or Wo Pelvis 2-3 Views Left Result Date: 05/31/2023 CLINICAL DATA:  Fall EXAM: DG HIP (WITH OR WITHOUT PELVIS) 2-3V LEFT COMPARISON:  12/11/2022 FINDINGS: Remote healed fracture of the right pubic rami. New acute fracture of the left inferior and superior pubic rami. Possible fracture of the left sacral ala. No fracture of the left proximal femur identified. 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 IMPRESSION: 1. Acute fractures of the left inferior and superior pubic rami. 2. Possible fracture of the left sacral ala. 3. Remote healed fracture of the right pubic rami. 4. 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 Electronically Signed   By: Gaylyn Rong M.D.   On: 05/31/2023 18:38    Procedures Procedures  {Document cardiac monitor, telemetry assessment procedure when appropriate:1}  Medications Ordered in ED Medications  HYDROmorphone (DILAUDID) injection 0.5 mg (0.5 mg Intravenous Given  05/31/23 2217)    ED Course/ Medical Decision Making/ A&P  Patient with pubic rami fractures.  She will be admitted to medicine and Dr. Romeo Apple will consult tomorrow` {   Click here for ABCD2, HEART and other calculatorsREFRESH Note before signing :1}                              Medical Decision Making Amount and/or Complexity of Data Reviewed Labs: ordered. Radiology: ordered.  Risk Prescription drug management. Decision regarding hospitalization.   Patient with superior and inferior left superior rami fractures.  {Document critical care time when appropriate:1} {Document review of labs and clinical decision tools ie heart score, Chads2Vasc2 etc:1}  {Document your independent review of radiology images, and any outside records:1} {Document your discussion with family members, caretakers, and with consultants:1} {Document social determinants of health affecting pt's care:1} {Document your decision making why or why not admission, treatments were needed:1} Final Clinical Impression(s) / ED Diagnoses Final diagnoses:  Closed fracture of multiple pubic rami, left, initial encounter [facility])    Rx / DC Orders ED Discharge Orders     None

## 2023-05-31 NOTE — H&P (Signed)
 History and Physical    Patient: Laura Dalton GNF:[id number] DOB: [birth year] DOA: 05/31/2023 DOS: the patient was seen and examined on 06/01/2023 PCP: Dettinger, Elige Radon, MD  Patient coming from: Home  Chief Complaint:  Chief Complaint  Patient presents with   Fall   HPI: Laura Dalton is a 73 y.o. female with medical history significant of type 2 diabetes mellitus, hypertension, hypothyroidism, hyperlipidemia who presents to the emergency department due to fall sustained while getting out of her car.  Patient states that shoulder hit the door while getting out of the car in the parking lot and she lost balance and sustained a fall with an abrasion of the left elbow and left groin area.  She denies hitting her head or losing consciousness.  She had difficulty in being able to ambulate after the fall. Patient lives at home with a son who works all day, so patient was usually at home alone most of the time.  ED Course:  In the emergency department, patient was hemodynamically stable..  Workup in the ED showed normal CBC except for WBC of 16.3.  BMP showed sodium 134, potassium 3.3, chloride 97, bicarb 26, blood glucose 129, BUN 8, creatinine 0.66, calcium 9.5. Left hip x-ray showed acute fractures of the left inferior and superior pubic rami and possible fracture of the left sacral alla. Patient was treated with IV Dilaudid 0.5 mg x 1 Orthopedic surgeon (Dr. Romeo Apple) was consulted and will see patient in the morning per EDP.  Review of Systems: Review of systems as noted in the HPI. All other systems reviewed and are negative.   Past Medical History:  Diagnosis Date   Bursitis of hip    Diabetes mellitus without complication (HCC)    type II   Hyperlipidemia    Hypertension    Hypothyroidism    Low back pain    benign turmor back   Stroke (HCC)    Vitamin D deficiency disease    Past Surgical History:  Procedure Laterality Date   APPENDECTOMY     BACK SURGERY  1996    turmor on back    CHOLECYSTECTOMY     LEG SURGERY Right    "clot and had drain in leg  rt lower leg    Social History:  reports that she quit smoking about 15 years ago. Her smoking use included cigarettes. She started smoking about 60 years ago. She has a 22.6 pack-year smoking history. She has never used smokeless tobacco. She reports that she does not drink alcohol and does not use drugs.   Allergies  Allergen Reactions   Aspirin Hives   Gabapentin Other (See Comments)    Dizzy,falls    Family History  Problem Relation Age of Onset   Diabetes Mother    Stroke Mother    Seizures Mother    Cancer Father        prostate   COPD Sister    COPD Sister    COPD Sister    Cancer Brother        lung   Cancer Brother        prostate   COPD Brother    COPD Brother    Thyroid disease Son    Hashimoto's thyroiditis Son    Breast cancer Neg Hx      Prior to Admission medications   Medication Sig Start Date End Date Taking? Authorizing Provider  albuterol (VENTOLIN HFA) 108 (90 Base) MCG/ACT inhaler 2 puffs every 6 hours  as needed for wheezing or shortness of breath 10/23/22   Dettinger, Elige Radon, MD  alendronate (FOSAMAX) 70 MG tablet TAKE 1 TABLET BY MOUTH WEEKLY  WITH 8 OZ OF PLAIN WATER 30  MINUTES BEFORE FIRST FOOD, DRINK OR MEDS. STAY UPRIGHT FOR 30  MINS 01/29/23   Dettinger, Elige Radon, MD  amitriptyline (ELAVIL) 50 MG tablet TAKE 1 TABLET BY MOUTH AT  BEDTIME 01/29/23   Dettinger, Elige Radon, MD  atorvastatin (LIPITOR) 40 MG tablet TAKE 1 TABLET BY MOUTH DAILY 01/29/23   Dettinger, Elige Radon, MD  calcium carbonate (OSCAL) 1500 (600 Ca) MG TABS tablet Take by mouth 2 (two) times daily with a meal.    [provider]  clopidogrel (PLAVIX) 75 MG tablet TAKE 1 TABLET BY MOUTH DAILY 01/29/23   Dettinger, Elige Radon, MD  donepezil (ARICEPT) 10 MG tablet Take 1 tablet (10 mg total) by mouth at bedtime. 01/18/23   Dettinger, Elige Radon, MD  DULoxetine (CYMBALTA) 60 MG capsule TAKE 1  CAPSULE BY MOUTH DAILY 01/29/23   Dettinger, Elige Radon, MD  levothyroxine (SYNTHROID) 125 MCG tablet TAKE 1 TABLET BY MOUTH DAILY  BEFORE BREAKFAST 01/29/23   Dettinger, Elige Radon, MD  lisinopril (ZESTRIL) 40 MG tablet Take 1 tablet (40 mg total) by mouth daily. 10/23/22   Dettinger, Elige Radon, MD  traMADol (ULTRAM) 50 MG tablet Take 1 tablet (50 mg total) by mouth every 6 (six) hours as needed. 01/18/23   Dettinger, Elige Radon, MD  triamcinolone cream (KENALOG) 0.1 % Apply 1 application topically 2 (two) times daily. 08/01/18   Dettinger, Elige Radon, MD    Physical Exam: BP 127/85   Pulse 83   Temp 98.3 F (36.8 C) (Oral)   Resp 18   Ht 5\' 6"  (1.676 m)   Wt 74.8 kg   SpO2 94%   BMI 26.63 kg/m   General: 73 y.o. year-old female well developed well nourished in no acute distress.  Alert and oriented x3. HEENT: NCAT, EOMI Neck: Supple, trachea medial Cardiovascular: Regular rate and rhythm with no rubs or gallops.  No thyromegaly or JVD noted.  No lower extremity edema. 2/4 pulses in all 4 extremities. Respiratory: Clear to auscultation with no wheezes or rales. Good inspiratory effort. Abdomen: Soft, nontender nondistended with normal bowel sounds x4 quadrants. Muskuloskeletal: Minimal ROM due to left hip pain.  No cyanosis, clubbing or edema noted bilaterally Neuro: CN II-XII intact, sensation, reflexes intact Skin: No ulcerative lesions noted or rashes Psychiatry: Judgement and insight appear normal. Mood is appropriate for condition and setting          Labs on Admission:  Basic Metabolic Panel: Recent Labs  Lab 05/31/23 2042  NA 134*  K 3.3*  CL 97*  CO2 26  GLUCOSE 129*  BUN 8  CREATININE 0.66  CALCIUM 9.5   Liver Function Tests: No results for input(s): "AST", "ALT", "ALKPHOS", "BILITOT", "PROT", "ALBUMIN" in the last 168 hours. No results for input(s): "LIPASE", "AMYLASE" in the last 168 hours. No results for input(s): "AMMONIA" in the last 168 hours. CBC: Recent Labs   Lab 05/31/23 2042  WBC 16.3*  NEUTROABS 13.1*  HGB 14.0  HCT 42.9  MCV 85.0  PLT 305   Cardiac Enzymes: No results for input(s): "CKTOTAL", "CKMB", "CKMBINDEX", "TROPONINI" in the last 168 hours.  BNP (last 3 results) No results for input(s): "BNP" in the last 8760 hours.  ProBNP (last 3 results) No results for input(s): "PROBNP" in the last 8760 hours.  CBG: No results for input(s): "GLUCAP" in the last 168 hours.  Radiological Exams on Admission: DG Hip Unilat W or Wo Pelvis 2-3 Views Left Result Date: 05/31/2023 CLINICAL DATA:  Fall EXAM: DG HIP (WITH OR WITHOUT PELVIS) 2-3V LEFT COMPARISON:  12/11/2022 FINDINGS: Remote healed fracture of the right pubic rami. New acute fracture of the left inferior and superior pubic rami. Possible fracture of the left sacral ala. No fracture of the left proximal femur identified. 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 IMPRESSION: 1. Acute fractures of the left inferior and superior pubic rami. 2. Possible fracture of the left sacral ala. 3. Remote healed fracture of the right pubic rami. 4. 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 Electronically Signed   By: Gaylyn Rong M.D.   On: 05/31/2023 18:38    EKG: I independently viewed the EKG done and my findings are as followed: EKG was not done in the ED  Assessment/Plan Present on Admission:  Closed fracture of multiple pubic rami, left, initial encounter [facility])  Essential hypertension  Mixed hyperlipidemia  Acquired hypothyroidism  Principal Problem:   Closed fracture of multiple pubic rami, left, initial encounter [facility]) Active Problems:   Essential hypertension   Mixed hyperlipidemia   Acquired hypothyroidism   Fall   Type 2 diabetes mellitus with hyperglycemia (HCC)  Closed fracture of multiple pubic rami, left, initial encounter Possible fracture of left sacral alla Fall Left hip x-ray showed acute fractures of the left inferior and superior pubic rami and  possible fracture of the left sacral alla. Continue oxycodone 5 mg every 4 hours as needed Continue fall precaution Continue PT/OT eval and treat Orthopedic surgeon (Dr. Romeo Apple) was consulted and will see patient in the morning per EDP  Essential hypertension Continue lisinopril  Mixed hyperlipidemia Continue Lipitor  Type 2 diabetes mellitus with hyperglycemia Hemoglobin A1c on 10/23/2022 was 5.9 indicating good blood glucose control Continue diet and lifestyle modification  Acquired hypothyroidism Continue Synthroid   DVT prophylaxis: Lovenox  Code Status: Full code  Family Communication: Daughter-in-law at bedside (all questions answered to satisfaction)  Consults: Orthopedic surgery  Severity of Illness: The appropriate patient status for this patient is INPATIENT. Inpatient status is judged to be reasonable and necessary in order to provide the required intensity of service to ensure the patient's safety. The patient's presenting symptoms, physical exam findings, and initial radiographic and laboratory data in the context of their chronic comorbidities is felt to place them at high risk for further clinical deterioration. Furthermore, it is not anticipated that the patient will be medically stable for discharge from the hospital within 2 midnights of admission.   * I certify that at the point of admission it is my clinical judgment that the patient will require inpatient hospital care spanning beyond 2 midnights from the point of admission due to high intensity of service, high risk for further deterioration and high frequency of surveillance required.*  Author: Frankey Shown, DO 06/01/2023 2:39 AM  For on call review [email or web address].

## 2023-05-31 NOTE — ED Triage Notes (Signed)
 Pt fell while getting out of her car, pt states the door hit her shoulder causing her to fall in the parking lot, pt denies hitting her head with fall. Pt with abrasion noted to left elbow. +left groin area. Pt drove self here but was unable to ambulate when out of car.

## 2023-05-31 NOTE — ED Notes (Signed)
 Patient had an abrasion to the left elbow, this was bandaged per patient request. Bleeding was under controlled prior to bandage.

## 2023-06-01 DIAGNOSIS — E1165 Type 2 diabetes mellitus with hyperglycemia: Secondary | ICD-10-CM | POA: Insufficient documentation

## 2023-06-01 DIAGNOSIS — W19XXXA Unspecified fall, initial encounter: Secondary | ICD-10-CM | POA: Insufficient documentation

## 2023-06-01 DIAGNOSIS — S32592A Other specified fracture of left pubis, initial encounter for closed fracture: Secondary | ICD-10-CM | POA: Diagnosis not present

## 2023-06-01 LAB — CBC
HCT: 36.7 % (ref 36.0–46.0)
Hemoglobin: 11.9 g/dL — ABNORMAL LOW (ref 12.0–15.0)
MCH: 27.7 pg (ref 26.0–34.0)
MCHC: 32.4 g/dL (ref 30.0–36.0)
MCV: 85.3 fL (ref 80.0–100.0)
Platelets: 263 10*3/uL (ref 150–400)
RBC: 4.3 MIL/uL (ref 3.87–5.11)
RDW: 13.2 % (ref 11.5–15.5)
WBC: 11.4 10*3/uL — ABNORMAL HIGH (ref 4.0–10.5)
nRBC: 0 % (ref 0.0–0.2)

## 2023-06-01 LAB — COMPREHENSIVE METABOLIC PANEL
ALT: 7 U/L (ref 0–44)
AST: 16 U/L (ref 15–41)
Albumin: 3.5 g/dL (ref 3.5–5.0)
Alkaline Phosphatase: 58 U/L (ref 38–126)
Anion gap: 6 (ref 5–15)
BUN: 11 mg/dL (ref 8–23)
CO2: 27 mmol/L (ref 22–32)
Calcium: 8.2 mg/dL — ABNORMAL LOW (ref 8.9–10.3)
Chloride: 102 mmol/L (ref 98–111)
Creatinine, Ser: 0.58 mg/dL (ref 0.44–1.00)
GFR, Estimated: >60 mL/min (ref >60)
Glucose, Bld: 117 mg/dL — ABNORMAL HIGH (ref 70–99)
Potassium: 3 mmol/L — ABNORMAL LOW (ref 3.5–5.1)
Sodium: 135 mmol/L (ref 135–145)
Total Bilirubin: 0.5 mg/dL (ref 0.0–1.2)
Total Protein: 6.1 g/dL — ABNORMAL LOW (ref 6.5–8.1)

## 2023-06-01 LAB — MAGNESIUM: Magnesium: 1.9 mg/dL (ref 1.7–2.4)

## 2023-06-01 LAB — PHOSPHORUS: Phosphorus: 3.3 mg/dL (ref 2.5–4.6)

## 2023-06-01 MED ORDER — CALCIUM CARBONATE 1250 (500 CA) MG PO TABS
500.0000 mg | ORAL_TABLET | Freq: Two times a day (BID) | ORAL | Status: DC
  Administered 2023-06-01 – 2023-06-02 (×3): 1250 mg via ORAL
  Filled 2023-06-01 (×3): qty 1

## 2023-06-01 MED ORDER — ACETAMINOPHEN 650 MG RE SUPP: 650.0000 mg | Freq: Four times a day (QID) | RECTAL | Status: DC | PRN

## 2023-06-01 MED ORDER — ONDANSETRON HCL 4 MG PO TABS
4.0000 mg | ORAL_TABLET | Freq: Four times a day (QID) | ORAL | Status: DC | PRN
  Administered 2023-06-01: 4 mg via ORAL
  Filled 2023-06-01: qty 1

## 2023-06-01 MED ORDER — LEVOTHYROXINE SODIUM 25 MCG PO TABS
125.0000 ug | ORAL_TABLET | Freq: Every day | ORAL | Status: DC
  Administered 2023-06-01 – 2023-06-02 (×2): 125 ug via ORAL
  Filled 2023-06-01: qty 3
  Filled 2023-06-01: qty 1

## 2023-06-01 MED ORDER — OXYCODONE HCL 5 MG PO TABS
5.0000 mg | ORAL_TABLET | ORAL | Status: AC | PRN
  Administered 2023-06-01: 5 mg via ORAL
  Filled 2023-06-01: qty 1

## 2023-06-01 MED ORDER — OXYCODONE HCL 5 MG PO TABS
5.0000 mg | ORAL_TABLET | Freq: Once | ORAL | Status: AC | PRN
  Administered 2023-06-01: 5 mg via ORAL
  Filled 2023-06-01: qty 1

## 2023-06-01 MED ORDER — ACETAMINOPHEN 325 MG PO TABS
650.0000 mg | ORAL_TABLET | Freq: Four times a day (QID) | ORAL | Status: DC | PRN
  Administered 2023-06-01: 650 mg via ORAL
  Filled 2023-06-01: qty 2

## 2023-06-01 MED ORDER — LISINOPRIL 10 MG PO TABS
40.0000 mg | ORAL_TABLET | Freq: Every day | ORAL | Status: DC
  Administered 2023-06-01 – 2023-06-02 (×2): 40 mg via ORAL
  Filled 2023-06-01 (×2): qty 4

## 2023-06-01 MED ORDER — POTASSIUM CHLORIDE CRYS ER 20 MEQ PO TBCR
40.0000 meq | EXTENDED_RELEASE_TABLET | Freq: Two times a day (BID) | ORAL | Status: AC
Start: 2023-06-01 — End: 2023-06-01
  Administered 2023-06-01 (×2): 40 meq via ORAL
  Filled 2023-06-01 (×2): qty 2

## 2023-06-01 MED ORDER — METHOCARBAMOL 500 MG PO TABS
500.0000 mg | ORAL_TABLET | Freq: Three times a day (TID) | ORAL | Status: DC | PRN
  Administered 2023-06-01: 500 mg via ORAL
  Filled 2023-06-01: qty 1

## 2023-06-01 MED ORDER — ENOXAPARIN SODIUM 40 MG/0.4ML IJ SOSY
40.0000 mg | PREFILLED_SYRINGE | INTRAMUSCULAR | Status: DC
Start: 2023-06-01 — End: 2023-06-02
  Administered 2023-06-01 – 2023-06-02 (×2): 40 mg via SUBCUTANEOUS
  Filled 2023-06-01 (×2): qty 0.4

## 2023-06-01 MED ORDER — ATORVASTATIN CALCIUM 40 MG PO TABS
40.0000 mg | ORAL_TABLET | Freq: Every day | ORAL | Status: DC
  Administered 2023-06-01 – 2023-06-02 (×2): 40 mg via ORAL
  Filled 2023-06-01 (×2): qty 1

## 2023-06-01 MED ORDER — ONDANSETRON HCL 4 MG/2ML IJ SOLN: 4.0000 mg | Freq: Four times a day (QID) | INTRAMUSCULAR | Status: DC | PRN

## 2023-06-01 MED ORDER — LEVOTHYROXINE SODIUM 50 MCG PO TABS: 125.0000 ug | ORAL_TABLET | Freq: Every day | ORAL | Status: DC

## 2023-06-01 NOTE — Care Management CC44 (Signed)
 Condition Code 44 Documentation Completed  Patient Details  Name: Laura Dalton MRN: [id number] Date of Birth: June 14, 1950   Condition Code 44 given:  Yes Patient signature on Condition Code 44 notice:  Yes Documentation of 2 MD's agreement:  Yes Code 44 added to claim:  Yes    Isabella Bowens, LCSWA 06/01/2023, 1:00 PM

## 2023-06-01 NOTE — TOC Initial Note (Addendum)
 Transition of Care [facility]) - Initial/Assessment Note    Patient Details  Name: Laura Dalton MRN: [id number] Date of Birth: September 25, 1950  Transition of Care [facility] 2) CM/SW Contact:    Isabella Bowens, LCSWA Phone Number: 06/01/2023, 1:07 PM  Clinical Narrative:                 CSW spoke with patient and daughter at bedside about PT recommendation for SNF. Patient and daughter agreeable to CSW sending patient out to SNF's in [place], [place], and [place] only. TOC will continue to follow.   Patient FL2 completed, so far one bed offer will see if [facility] offers since that it family top choice. PASRR is pending , requested documents have already been submitted.   Expected Discharge Plan: Skilled Nursing Facility Barriers to Discharge: Continued Medical Work up   Patient Goals and CMS Choice Patient states their goals for this hospitalization and ongoing recovery are:: DC to SNF for ST rehab CMS Medicare.gov Compare Post Acute Care list provided to:: Patient Choice offered to / list presented to : Patient, Adult Children [place] ownership interest in [facility].provided to:: Adult Children (Daughter - Oceanographer)    Expected Discharge Plan and Services In-house Referral: Clinical Social Work     Living arrangements for the past 2 months: Single Family Home                                      Prior Living Arrangements/Services Living arrangements for the past 2 months: Single Family Home Lives with:: Self Patient language and need for interpreter reviewed:: Yes Do you feel safe going back to the place where you live?: Yes (After ST rehab)      Need for Family Participation in Patient Care: Yes (Comment) Care giver support system in place?: Yes (comment)   Criminal Activity/Legal Involvement Pertinent to Current Situation/Hospitalization: No - Comment as needed  Activities of Daily Living   ADL Screening (condition at time of  admission) Independently performs ADLs?: Yes (appropriate for developmental age) Is the patient deaf or have difficulty hearing?: No Does the patient have difficulty seeing, even when wearing glasses/contacts?: No Does the patient have difficulty concentrating, remembering, or making decisions?: No  Permission Sought/Granted      Share Information with NAME: Shanaya and Gearldine Bienenstock     Permission granted to share info w Relationship: Patient and Daughter     Emotional Assessment Appearance:: Appears stated age Attitude/Demeanor/Rapport: Engaged Affect (typically observed): Appropriate, Accepting Orientation: : Oriented to Self, Oriented to Place, Oriented to Situation Alcohol / Substance Use: Not Applicable Psych Involvement: No (comment)  Admission diagnosis:  Closed fracture of multiple pubic rami, left, initial encounter [facility]) [S32.592A] Patient Active Problem List   Diagnosis Date Noted   Fall 06/01/2023   Type 2 diabetes mellitus with hyperglycemia (HCC) 06/01/2023   Closed fracture of multiple pubic rami, left, initial encounter (HCC) 05/31/2023   Lumbar spondylosis 10/17/2022   Osteopenia with high risk of fracture 05/18/2022   Thoracic compression fracture (HCC) 04/13/2022   Lumbar compression fracture (HCC) 04/13/2022   Memory changes 08/23/2021   Thoracic myelopathy 10/12/2018   Degeneration of spine 05/07/2017   Obesity (BMI 30.0-34.9) 03/12/2015   Acquired hypothyroidism 07/29/2014   Osteoarthritis of right shoulder 01/21/2014   Leg length discrepancy 11/04/2013   Lumbar post-laminectomy syndrome 11/04/2013   Diabetic peripheral neuropathy associated with type 2 diabetes mellitus (  HCC) 11/04/2013   Chronic obstructive pulmonary disease (HCC) 10/01/2012   Chronic back pain 10/01/2012   Essential hypertension 10/01/2012   DM (diabetes mellitus) (HCC) 10/01/2012   Vitamin D deficiency 10/01/2012   Mixed hyperlipidemia 10/01/2012   Dyslipidemia due to type 2 diabetes  mellitus (HCC) 10/01/2012   PCP:  Dettinger, Elige Radon, MD Pharmacy:   [facility] [address] [place] - [address] [place] [address] Phone: [phone] Fax: [phone]  [facility] - [place], [place] - [address] [place] [address] Phone: [phone] Fax: [phone]  [facility] [address], [place] - [address] [place] [address] [place] [address] [place] [address] Phone: [phone] Fax: [phone]     Social Drivers of Health (SDOH) Social History: SDOH Screenings   Food Insecurity: No Food Insecurity (06/01/2023)  Housing: Low Risk  (06/01/2023)  Transportation Needs: No Transportation Needs (06/01/2023)  Utilities: Not At Risk (06/01/2023)  Alcohol Screen: Low Risk  (01/31/2023)  Depression (PHQ2-9): Medium Risk (02/02/2023)  Financial Resource Strain: Low Risk  (01/31/2023)  Physical Activity: Inactive (01/31/2023)  Social Connections: Moderately Integrated (06/01/2023)  Stress: No Stress Concern Present (01/31/2023)  Tobacco Use: Medium Risk (05/31/2023)  Health Literacy: Inadequate Health Literacy (01/31/2023)   SDOH Interventions:     Readmission Risk Interventions    06/01/2023    1:04 PM  Readmission Risk Prevention Plan  Medication Screening Complete  Transportation Screening Complete

## 2023-06-01 NOTE — Care Management Obs Status (Signed)
 MEDICARE OBSERVATION STATUS NOTIFICATION   Patient Details  Name: Laura Dalton MRN: [id number] Date of Birth: 05/24/1950   Medicare Observation Status Notification Given:  Yes    Isabella Bowens, LCSWA 06/01/2023, 1:00 PM

## 2023-06-01 NOTE — ED Notes (Signed)
 ED TO INPATIENT HANDOFF REPORT  ED Nurse Name and Phone #: [phone]  S Name/Age/Gender Laura Dalton 73 y.o. female Room/Bed: APA04/APA04  Code Status   Code Status: Full Code  Home/SNF/Other Rehab Patient oriented to: self, place, time, and situation Is this baseline? Yes   Triage Complete: Triage complete  Chief Complaint Closed fracture of multiple pubic rami, left, initial encounter [facility]) [S32.592A]  Triage Note Pt fell while getting out of her car, pt states the door hit her shoulder causing her to fall in the parking lot, pt denies hitting her head with fall. Pt with abrasion noted to left elbow. +left groin area. Pt drove self here but was unable to ambulate when out of car.    Allergies Allergies  Allergen Reactions   Aspirin Hives   Gabapentin Other (See Comments)    Dizzy,falls    Level of Care/Admitting Diagnosis ED Disposition     ED Disposition  Admit   Condition  --   Comment  Hospital Area: [facility] [[id number]]  Level of Care: Med-Surg [16]  Covid Evaluation: Asymptomatic - no recent exposure (last 10 days) testing not required  Diagnosis: Closed fracture of multiple pubic rami, left, initial encounter [facility]) [[id number]]  Admitting Physician: Frankey Shown [[id number]]  Attending Physician: Erick Blinks [[id number]]          B Medical/Surgery History Past Medical History:  Diagnosis Date   Bursitis of hip    Diabetes mellitus without complication (HCC)    type II   Hyperlipidemia    Hypertension    Hypothyroidism    Low back pain    benign turmor back   Stroke (HCC)    Vitamin D deficiency disease    Past Surgical History:  Procedure Laterality Date   APPENDECTOMY     BACK SURGERY  1996   turmor on back    CHOLECYSTECTOMY     LEG SURGERY Right    "clot and had drain in leg  rt lower leg     A IV Location/Drains/Wounds Patient Lines/Drains/Airways Status     Active Line/Drains/Airways     Name Placement  date Placement time Site Days   Peripheral IV 05/31/23 20 G Right Antecubital 05/31/23  2037  Antecubital  1   External Urinary Catheter 06/01/23  1330  --  less than 1            Intake/Output Last 24 hours No intake or output data in the 24 hours ending 06/01/23 1426  Labs/Imaging Results for orders placed or performed during the hospital encounter of 05/31/23 (from the past 48 hours)  CBC with Differential     Status: Abnormal   Collection Time: 05/31/23  8:42 PM  Result Value Ref Range   WBC 16.3 (H) 4.0 - 10.5 K/uL   RBC 5.05 3.87 - 5.11 MIL/uL   Hemoglobin 14.0 12.0 - 15.0 g/dL   HCT 78.4 69.6 - 29.5 %   MCV 85.0 80.0 - 100.0 fL   MCH 27.7 26.0 - 34.0 pg   MCHC 32.6 30.0 - 36.0 g/dL   RDW 28.4 13.2 - 44.0 %   Platelets 305 150 - 400 K/uL   nRBC 0.0 0.0 - 0.2 %   Neutrophils Relative % 81 %   Neutro Abs 13.1 (H) 1.7 - 7.7 K/uL   Lymphocytes Relative 8 %   Lymphs Abs 1.3 0.7 - 4.0 K/uL   Monocytes Relative 9 %   Monocytes Absolute 1.5 (H) 0.1 - 1.0 K/uL  Eosinophils Relative 2 %   Eosinophils Absolute 0.3 0.0 - 0.5 K/uL   Basophils Relative 0 %   Basophils Absolute 0.0 0.0 - 0.1 K/uL   Immature Granulocytes 0 %   Abs Immature Granulocytes 0.07 0.00 - 0.07 K/uL    Comment: Performed at [facility], [address], [place], [place] [address]  Basic metabolic panel     Status: Abnormal   Collection Time: 05/31/23  8:42 PM  Result Value Ref Range   Sodium 134 (L) 135 - 145 mmol/L   Potassium 3.3 (L) 3.5 - 5.1 mmol/L   Chloride 97 (L) 98 - 111 mmol/L   CO2 26 22 - 32 mmol/L   Glucose, Bld 129 (H) 70 - 99 mg/dL    Comment: Glucose reference range applies only to samples taken after fasting for at least 8 hours.   BUN 8 8 - 23 mg/dL   Creatinine, Ser 1.09 0.44 - 1.00 mg/dL   Calcium 9.5 8.9 - 32.3 mg/dL   GFR, Estimated >55 >73 mL/min    Comment: (NOTE) Calculated using the CKD-EPI Creatinine Equation (2021)    Anion gap 11 5 - 15    Comment: Performed at [facility], [address] [place], [place] [address]  Comprehensive metabolic panel     Status: Abnormal   Collection Time: 06/01/23  5:14 AM  Result Value Ref Range   Sodium 135 135 - 145 mmol/L   Potassium 3.0 (L) 3.5 - 5.1 mmol/L   Chloride 102 98 - 111 mmol/L   CO2 27 22 - 32 mmol/L   Glucose, Bld 117 (H) 70 - 99 mg/dL    Comment: Glucose reference range applies only to samples taken after fasting for at least 8 hours.   BUN 11 8 - 23 mg/dL   Creatinine, Ser 4.27 0.44 - 1.00 mg/dL   Calcium 8.2 (L) 8.9 - 10.3 mg/dL   Total Protein 6.1 (L) 6.5 - 8.1 g/dL   Albumin 3.5 3.5 - 5.0 g/dL   AST 16 15 - 41 U/L   ALT 7 0 - 44 U/L   Alkaline Phosphatase 58 38 - 126 U/L   Total Bilirubin 0.5 0.0 - 1.2 mg/dL   GFR, Estimated >06 >23 mL/min    Comment: (NOTE) Calculated using the CKD-EPI Creatinine Equation (2021)    Anion gap 6 5 - 15    Comment: Performed at [facility], [address], [place], [place] [address]  CBC     Status: Abnormal   Collection Time: 06/01/23  5:14 AM  Result Value Ref Range   WBC 11.4 (H) 4.0 - 10.5 K/uL   RBC 4.30 3.87 - 5.11 MIL/uL   Hemoglobin 11.9 (L) 12.0 - 15.0 g/dL   HCT 15.1 76.1 - 60.7 %   MCV 85.3 80.0 - 100.0 fL   MCH 27.7 26.0 - 34.0 pg   MCHC 32.4 30.0 - 36.0 g/dL   RDW 37.1 06.2 - 69.4 %   Platelets 263 150 - 400 K/uL   nRBC 0.0 0.0 - 0.2 %    Comment: Performed at [facility], [address], [place], [place] [address]  Magnesium     Status: None   Collection Time: 06/01/23  5:14 AM  Result Value Ref Range   Magnesium 1.9 1.7 - 2.4 mg/dL    Comment: Performed at [facility], [address] [place], [place] [address]  Phosphorus     Status: None   Collection Time: 06/01/23  5:14 AM  Result Value Ref Range   Phosphorus 3.3 2.5 - 4.6 mg/dL    Comment: Performed at [facility], [address], [place], [place] [address]   DG Hip Lucienne Capers or [place] Pelvis 2-3 Views Left Result Date: 05/31/2023 CLINICAL DATA:  Fall EXAM: DG HIP (WITH OR WITHOUT  PELVIS) 2-3V LEFT COMPARISON:  12/11/2022 FINDINGS: Remote healed fracture of the right pubic rami. New acute fracture of the left inferior and superior pubic rami. Possible fracture of the left sacral ala. No fracture of the left proximal femur identified. 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 IMPRESSION: 1. Acute fractures of the left inferior and superior pubic rami. 2. Possible fracture of the left sacral ala. 3. Remote healed fracture of the right pubic rami. 4. 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 Electronically Signed   By: Gaylyn Rong M.D.   On: 05/31/2023 18:38    Pending Labs Unresulted Labs (From admission, onward)     Start     Ordered   06/08/23 0500  Creatinine, serum  (enoxaparin (LOVENOX)    CrCl >/= 30 ml/min)  Weekly,   R     Comments: while on enoxaparin therapy    06/01/23 0225   06/02/23 0500  CBC  (Procedure Panel)  Tomorrow morning,   R       Placed in "And" Linked Group   06/01/23 1211   06/02/23 0500  Basic metabolic panel  (Procedure Panel)  Tomorrow morning,   R       Placed in "And" Linked Group   06/01/23 1211   06/02/23 0500  Magnesium  (Procedure Panel)  Tomorrow morning,   R       Placed in "And" Linked Group   06/01/23 1211            Vitals/Pain Today's Vitals   06/01/23 1100 06/01/23 1200 06/01/23 1423 06/01/23 1423  BP: (!) 122/59 (!) 108/48    Pulse: 74 64    Resp: 16 15    Temp:   98.4 F (36.9 C)   TempSrc:   Oral   SpO2: 95% 94%    Weight:      Height:      PainSc:    0-No pain    Isolation Precautions No active isolations  Medications Medications  enoxaparin (LOVENOX) injection 40 mg (40 mg Subcutaneous Given 06/01/23 0959)  acetaminophen (TYLENOL) tablet 650 mg (650 mg Oral Given 06/01/23 0317)    Or  acetaminophen (TYLENOL) suppository 650 mg ( Rectal See Alternative 06/01/23 0317)  ondansetron (ZOFRAN) tablet 4 mg (4 mg Oral Given 06/01/23 0319)    Or  ondansetron (ZOFRAN) injection 4 mg ( Intravenous  See Alternative 06/01/23 0319)  atorvastatin (LIPITOR) tablet 40 mg (40 mg Oral Given 06/01/23 1000)  lisinopril (ZESTRIL) tablet 40 mg (40 mg Oral Given 06/01/23 1000)  calcium carbonate (OS-CAL - dosed in mg of elemental calcium) tablet 1,250 mg (1,250 mg Oral Given 06/01/23 0959)  levothyroxine (SYNTHROID) tablet 125 mcg (125 mcg Oral Given 06/01/23 0536)  methocarbamol (ROBAXIN) tablet 500 mg (has no administration in time range)  potassium chloride SA (KLOR-CON M) CR tablet 40 mEq (40 mEq Oral Given 06/01/23 1329)  HYDROmorphone (DILAUDID) injection 0.5 mg (0.5 mg Intravenous Given 05/31/23 2217)  oxyCODONE (Oxy IR/ROXICODONE) immediate release tablet 5 mg (5 mg Oral Given 06/01/23 0318)  oxyCODONE (Oxy IR/ROXICODONE) immediate release tablet 5 mg (5 mg Oral Given 06/01/23 1007)    Mobility walks with person assist  Focused Assessments     R Recommendations: See Admitting Provider Note  Report given to:   Additional Notes:

## 2023-06-01 NOTE — Plan of Care (Signed)

## 2023-06-01 NOTE — Evaluation (Signed)
 Physical Therapy Evaluation Patient Details Name: Laura Dalton MRN: [id number] DOB: [birth year] Today's Date: 06/01/2023  History of Present Illness  Laura Dalton is a 73 y.o. female with medical history significant of type 2 diabetes mellitus, hypertension, hypothyroidism, hyperlipidemia who presents to the emergency department due to fall sustained while getting out of her car.  Patient states that shoulder hit the door while getting out of the car in the parking lot and she lost balance and sustained a fall with an abrasion of the left elbow and left groin area.  She denies hitting her head or losing consciousness.  She had difficulty in being able to ambulate after the fall.  Patient lives at home with a son who works all day, so patient was usually at home alone most of the time.   Clinical Impression  Pt demonstrates decreased BLE rom and strength, primarily limited to pain. Balance also effected due to pain at this time. Pt requires min/mod assist for bed mobility with evidence of pain. Pt requires min assist for STS transfer with RW for UE support and decreased WB on LLE. Pt demonstrates ability to ambulate with RW, CGA, and gait belt for short distance limited by pain and fatigue of LE musculature. Pt would continue to benefit from skilled acute PT for increased balance, LE strength, LE endurance, and decreased pain with movement for increased quality of life and return to higher level of function.         If plan is discharge home, recommend the following: A lot of help with walking and/or transfers;A lot of help with bathing/dressing/bathroom;Assistance with cooking/housework;Assist for transportation;Help with stairs or ramp for entrance   Can travel by private vehicle   No    Equipment Recommendations None recommended by PT  Recommendations for Other Services       Functional Status Assessment Patient has had a recent decline in their functional status and demonstrates the  ability to make significant improvements in function in a reasonable and predictable amount of time.     Precautions / Restrictions Precautions Precautions: Fall Recall of Precautions/Restrictions: Intact Precaution/Restrictions Comments: WBAT Restrictions Weight Bearing Restrictions Per Provider Order: No      Mobility  Bed Mobility Overal bed mobility: Needs Assistance Bed Mobility: Supine to Sit, Sit to Supine     Supine to sit: Min assist, Mod assist, HOB elevated Sit to supine: Min assist, Mod assist   General bed mobility comments: labord effort; some physical assist to come to sit.    Transfers Overall transfer level: Needs assistance Equipment used: Rolling walker (2 wheels) Transfers: Sit to/from Stand, Bed to chair/wheelchair/BSC Sit to Stand: Min assist, Contact guard assist   Step pivot transfers: Contact guard assist, Min assist       General transfer comment: slow labored movement    Ambulation/Gait Ambulation/Gait assistance: Contact guard assist, Min assist (RW) Gait Distance (Feet): 18 Feet Assistive device: Rolling walker (2 wheels) Gait Pattern/deviations: Step-to pattern, Decreased step length - right, Decreased step length - left, Decreased stride length, Antalgic Gait velocity: decreased     General Gait Details: Pt demonstrates decreased WB on LLE during ambulation causing decreased gait quality and velocity.  Stairs            Wheelchair Mobility     Tilt Bed    Modified Rankin (Stroke Patients Only)       Balance Overall balance assessment: Needs assistance Sitting-balance support: No upper extremity supported, Feet supported Sitting balance-Leahy Scale: Fair  Sitting balance - Comments: fair to good at EOB   Standing balance support: Bilateral upper extremity supported, During functional activity, Reliant on assistive device for balance Standing balance-Leahy Scale: Fair Standing balance comment: using RW                              Pertinent Vitals/Pain Pain Assessment Pain Assessment: 0-10 Pain Score: 1  Pain Location: groin Pain Descriptors / Indicators: Aching Pain Intervention(s): Monitored during session, Repositioned    Home Living Family/patient expects to be discharged to:: Private residence Living Arrangements: Other relatives Available Help at Discharge: Available PRN/intermittently;Family Type of Home: House Home Access: Ramped entrance       Home Layout: One level Home Equipment: Agricultural consultant (2 wheels);BSC/3in1;Wheelchair - manual      Prior Function Prior Level of Function : Independent/Modified Independent             Mobility Comments: Short distance community ambulator with RW ADLs Comments: Independent ADL and IADL's. Drives     Extremity/Trunk Assessment   Upper Extremity Assessment Upper Extremity Assessment: Defer to OT evaluation    Lower Extremity Assessment Lower Extremity Assessment: Generalized weakness;LLE deficits/detail LLE Deficits / Details: pain during ambulation, decreased ROM and strength    Cervical / Trunk Assessment Cervical / Trunk Assessment: Kyphotic  Communication   Communication Communication: No apparent difficulties    Cognition Arousal: Alert Behavior During Therapy: WFL for tasks assessed/performed   PT - Cognitive impairments: No apparent impairments                         Following commands: Intact       Cueing Cueing Techniques: Verbal cues, Visual cues     General Comments      Exercises     Assessment/Plan    PT Assessment Patient needs continued PT services  PT Problem List Decreased strength;Decreased range of motion;Decreased activity tolerance;Decreased balance;Decreased mobility;Pain       PT Treatment Interventions DME instruction;Gait training;Stair training;Functional mobility training;Therapeutic activities;Therapeutic exercise;Balance training;Patient/family education     PT Goals (Current goals can be found in the Care Plan section)  Acute Rehab PT Goals Patient Stated Goal: to return home PT Goal Formulation: With patient Time For Goal Achievement: 06/15/23 Potential to Achieve Goals: Good    Frequency Min 3X/week     Co-evaluation PT/OT/SLP Co-Evaluation/Treatment: Yes Reason for Co-Treatment: To address functional/ADL transfers PT goals addressed during session: Mobility/safety with mobility;Balance;Strengthening/ROM OT goals addressed during session: ADL's and self-care       AM-PAC PT "6 Clicks" Mobility  Outcome Measure Help needed turning from your back to your side while in a flat bed without using bedrails?: A Lot Help needed moving from lying on your back to sitting on the side of a flat bed without using bedrails?: A Lot Help needed moving to and from a bed to a chair (including a wheelchair)?: A Lot Help needed standing up from a chair using your arms (e.g., wheelchair or bedside chair)?: A Little Help needed to walk in hospital room?: A Little Help needed climbing 3-5 steps with a railing? : A Lot 6 Click Score: 14    End of Session Equipment Utilized During Treatment: Gait belt Activity Tolerance: Patient limited by fatigue;Patient limited by pain;Patient tolerated treatment well Patient left: in bed;with call bell/phone within reach Nurse Communication: Mobility status;Precautions PT Visit Diagnosis: Unsteadiness on feet (R26.81);Other abnormalities of gait  and mobility (R26.89);Muscle weakness (generalized) (M62.81);History of falling (Z91.81);Difficulty in walking, not elsewhere classified (R26.2)    Time: 1610-9604 PT Time Calculation (min) (ACUTE ONLY): 15 min   Charges:   PT Evaluation $PT Eval Low Complexity: 1 Low PT Treatments $Therapeutic Activity: 8-22 mins PT General Charges $$ ACUTE PT VISIT: 1 Visit         Luz Lex, PT, DPT [facility] Office: [phone] 11:55 AM,  06/01/23

## 2023-06-01 NOTE — Evaluation (Signed)
 Occupational Therapy Evaluation Patient Details Name: Laura Dalton MRN: [id number] DOB: 04-09-1950 Today's Date: 06/01/2023   History of Present Illness   Laura Dalton is a 73 y.o. female with medical history significant of type 2 diabetes mellitus, hypertension, hypothyroidism, hyperlipidemia who presents to the emergency department due to fall sustained while getting out of her car.  Patient states that shoulder hit the door while getting out of the car in the parking lot and she lost balance and sustained a fall with an abrasion of the left elbow and left groin area.  She denies hitting her head or losing consciousness.  She had difficulty in being able to ambulate after the fall.  Patient lives at home with a son who works all day, so patient was usually at home alone most of the time. (per DO)     Clinical Impressions Pt agreeable to OT and PT co-evaluation. Pt is independent at baseline with use of RW. Today pt required min to mod A for bed mobility and CGA to min A for ambulation. Slow labored movement with RW. B UE generally weak. Pt unable to attempt lower body dressing. Likely will need assist for lower body ADL's. Pt left in the bed with call bell within reach. Pt will benefit from continued OT in the hospital and recommended venue below to increase strength, balance, and endurance for safe ADL's.        If plan is discharge home, recommend the following:   A little help with walking and/or transfers;A lot of help with bathing/dressing/bathroom;Assistance with cooking/housework;Assist for transportation;Help with stairs or ramp for entrance     Functional Status Assessment   Patient has had a recent decline in their functional status and demonstrates the ability to make significant improvements in function in a reasonable and predictable amount of time.     Equipment Recommendations   None recommended by OT              Precautions/Restrictions    Precautions Precautions: Fall Recall of Precautions/Restrictions: Intact Restrictions Weight Bearing Restrictions Per Provider Order: No     Mobility Bed Mobility Overal bed mobility: Needs Assistance Bed Mobility: Supine to Sit, Sit to Supine     Supine to sit: Min assist, Mod assist, HOB elevated Sit to supine: Min assist, Mod assist   General bed mobility comments: labord effort; some physical assist to come to sit.    Transfers Overall transfer level: Needs assistance Equipment used: Rolling walker (2 wheels) Transfers: Sit to/from Stand, Bed to chair/wheelchair/BSC Sit to Stand: Min assist, Contact guard assist     Step pivot transfers: Contact guard assist, Min assist     General transfer comment: slow labored movement      Balance Overall balance assessment: Needs assistance Sitting-balance support: No upper extremity supported, Feet supported Sitting balance-Leahy Scale: Fair Sitting balance - Comments: fair to good at EOB   Standing balance support: Bilateral upper extremity supported, During functional activity, Reliant on assistive device for balance Standing balance-Leahy Scale: Fair Standing balance comment: using RW                           ADL either performed or assessed with clinical judgement   ADL Overall ADL's : Needs assistance/impaired Eating/Feeding: Sitting;Modified independent   Grooming: Set up;Sitting   Upper Body Bathing: Contact guard assist;Set up;Sitting   Lower Body Bathing: Maximal assistance;Total assistance;Sitting/lateral leans   Upper Body Dressing : Set up;Contact  guard assist;Sitting   Lower Body Dressing: Maximal assistance;Total assistance;Sitting/lateral leans   Toilet Transfer: Minimal assistance;Ambulation;Rolling walker (2 wheels) Toilet Transfer Details (indicate cue type and reason): Simulated via ambulation in the room with RW Toileting- Clothing Manipulation and Hygiene: Moderate  assistance;Maximal assistance;Sitting/lateral lean       Functional mobility during ADLs: Minimal assistance;Rolling walker (2 wheels) General ADL Comments: Able to ambulate to the door and back to bed with RW     Vision Baseline Vision/History: 1 Wears glasses;6 Macular Degeneration Ability to See in Adequate Light: 2 Moderately impaired Patient Visual Report: No change from baseline Vision Assessment?: No apparent visual deficits     Perception Perception: Not tested       Praxis Praxis: Not tested       Pertinent Vitals/Pain Pain Assessment Pain Assessment: 0-10 Pain Score: 1  Pain Location: groin Pain Descriptors / Indicators: Aching Pain Intervention(s): Monitored during session, Repositioned     Extremity/Trunk Assessment Upper Extremity Assessment Upper Extremity Assessment: Generalized weakness   Lower Extremity Assessment Lower Extremity Assessment: Defer to PT evaluation   Cervical / Trunk Assessment Cervical / Trunk Assessment: Kyphotic   Communication Communication Communication: No apparent difficulties   Cognition Arousal: Alert Behavior During Therapy: WFL for tasks assessed/performed Cognition: No apparent impairments                               Following commands: Intact       Cueing  General Comments   Cueing Techniques: Verbal cues                 Home Living Family/patient expects to be discharged to:: Private residence Living Arrangements: Other relatives (grandson) Available Help at Discharge: Available PRN/intermittently;Family Type of Home: House Home Access: Ramped entrance     Home Layout: One level     Bathroom Shower/Tub: Producer, television/film/video: Standard (BSC placed over toilet) Bathroom Accessibility: Yes (accessible to toilet) How Accessible: Accessible via wheelchair;Accessible via walker (only to the toilet; not the entire bathroom) Home Equipment: Rolling Walker (2  wheels);BSC/3in1;Wheelchair - manual          Prior Functioning/Environment Prior Level of Function : Independent/Modified Independent             Mobility Comments: Short distance community ambulator with RW ADLs Comments: Independent ADL and IADL's. Drives    OT Problem List: Decreased strength;Decreased activity tolerance;Impaired balance (sitting and/or standing);Pain   OT Treatment/Interventions: Self-care/ADL training;Therapeutic exercise;Therapeutic activities;Patient/family education;Balance training      OT Goals(Current goals can be found in the care plan section)   Acute Rehab OT Goals Patient Stated Goal: return home OT Goal Formulation: With patient/family Time For Goal Achievement: 06/15/23 Potential to Achieve Goals: Good   OT Frequency:  Min 2X/week    Co-evaluation PT/OT/SLP Co-Evaluation/Treatment: Yes Reason for Co-Treatment: To address functional/ADL transfers   OT goals addressed during session: ADL's and self-care                       End of Session Equipment Utilized During Treatment: Rolling walker (2 wheels);Gait belt  Activity Tolerance: Patient tolerated treatment well Patient left: in bed;with call bell/phone within reach;with family/visitor present  OT Visit Diagnosis: Unsteadiness on feet (R26.81);Other abnormalities of gait and mobility (R26.89);Muscle weakness (generalized) (M62.81);History of falling (Z91.81)                Time: 3474-2595 OT  Time Calculation (min): 23 min Charges:  OT General Charges $OT Visit: 1 Visit OT Evaluation $OT Eval Low Complexity: 1 Low  Baltazar Pekala OT, MOT   Danie Chandler 06/01/2023, 10:28 AM

## 2023-06-01 NOTE — [place] FL2 (Signed)
 [place] MEDICAID FL2 LEVEL OF CARE FORM     IDENTIFICATION  Patient Name: Laura Dalton Birthdate: [birth year] Sex: female Admission Date (Current Location): 05/31/2023  [facility] and [place] Number:  [employer] and Address:  [facility],  [address], [place] [address]      Provider Number: [id number]  Attending Physician Name and Address:  Erick Blinks, DO  Relative Name and Phone Number:  [name] ( daughter ) [phone]    Current Level of Care: Hospital Recommended Level of Care: Skilled Nursing Facility Prior Approval Number:    Date Approved/Denied:   PASRR Number: pending  Discharge Plan: SNF    Current Diagnoses: Patient Active Problem List   Diagnosis Date Noted   Fall 06/01/2023   Type 2 diabetes mellitus with hyperglycemia (HCC) 06/01/2023   Closed fracture of multiple pubic rami, left, initial encounter (HCC) 05/31/2023   Lumbar spondylosis 10/17/2022   Osteopenia with high risk of fracture 05/18/2022   Thoracic compression fracture (HCC) 04/13/2022   Lumbar compression fracture (HCC) 04/13/2022   Memory changes 08/23/2021   Thoracic myelopathy 10/12/2018   Degeneration of spine 05/07/2017   Obesity (BMI 30.0-34.9) 03/12/2015   Acquired hypothyroidism 07/29/2014   Osteoarthritis of right shoulder 01/21/2014   Leg length discrepancy 11/04/2013   Lumbar post-laminectomy syndrome 11/04/2013   Diabetic peripheral neuropathy associated with type 2 diabetes mellitus (HCC) 11/04/2013   Chronic obstructive pulmonary disease (HCC) 10/01/2012   Chronic back pain 10/01/2012   Essential hypertension 10/01/2012   DM (diabetes mellitus) (HCC) 10/01/2012   Vitamin D deficiency 10/01/2012   Mixed hyperlipidemia 10/01/2012   Dyslipidemia due to type 2 diabetes mellitus (HCC) 10/01/2012    Orientation RESPIRATION BLADDER Height & Weight     Self, Time, Place, Situation  Normal Continent Weight: 165 lb (74.8 kg) Height:  5'  6" (167.6 cm)  BEHAVIORAL SYMPTOMS/MOOD NEUROLOGICAL BOWEL NUTRITION STATUS      Continent Diet (See DC summary)  AMBULATORY STATUS COMMUNICATION OF NEEDS Skin   Extensive Assist Verbally Normal                       Personal Care Assistance Level of Assistance  Bathing, Feeding, Dressing Bathing Assistance: Maximum assistance Feeding assistance: Independent Dressing Assistance: Maximum assistance     Functional Limitations Info  Sight, Hearing, Speech Sight Info: Impaired Hearing Info: Adequate Speech Info: Adequate    SPECIAL CARE FACTORS FREQUENCY  PT (By licensed PT), OT (By licensed OT)     PT Frequency: 5 x a week OT Frequency: 5 x a week            Contractures Contractures Info: Not present    Additional Factors Info  Code Status, Allergies, Psychotropic Code Status Info: FULL Allergies Info: Aspirin and Gabapentin Psychotropic Info: Cymbalta         Current Medications (06/01/2023):  This is the current hospital active medication list Current Facility-Administered Medications  Medication Dose Route Frequency Provider Last Rate Last Admin   acetaminophen (TYLENOL) tablet 650 mg  650 mg Oral Q6H PRN Adefeso, Oladapo, DO   650 mg at 06/01/23 7425   Or   acetaminophen (TYLENOL) suppository 650 mg  650 mg Rectal Q6H PRN Adefeso, Oladapo, DO       atorvastatin (LIPITOR) tablet 40 mg  40 mg Oral Daily Adefeso, Oladapo, DO   40 mg at 06/01/23 1000   calcium carbonate (OS-CAL - dosed in mg of elemental calcium)  tablet 1,250 mg  500 mg of elemental calcium Oral BID WC Adefeso, Oladapo, DO   1,250 mg at 06/01/23 0959   enoxaparin (LOVENOX) injection 40 mg  40 mg Subcutaneous Q24H Adefeso, Oladapo, DO   40 mg at 06/01/23 0959   levothyroxine (SYNTHROID) tablet 125 mcg  125 mcg Oral Q0600 Adefeso, Oladapo, DO   125 mcg at 06/01/23 0536   lisinopril (ZESTRIL) tablet 40 mg  40 mg Oral Daily Adefeso, Oladapo, DO   40 mg at 06/01/23 1000   methocarbamol (ROBAXIN)  tablet 500 mg  500 mg Oral Q8H PRN Sherryll Burger, Pratik D, DO       ondansetron (ZOFRAN) tablet 4 mg  4 mg Oral Q6H PRN Adefeso, Oladapo, DO   4 mg at 06/01/23 0319   Or   ondansetron (ZOFRAN) injection 4 mg  4 mg Intravenous Q6H PRN Adefeso, Oladapo, DO       potassium chloride SA (KLOR-CON M) CR tablet 40 mEq  40 mEq Oral BID Sherryll Burger, Pratik D, DO       Current Outpatient Medications  Medication Sig Dispense Refill   albuterol (VENTOLIN HFA) 108 (90 Base) MCG/ACT inhaler 2 puffs every 6 hours as needed for wheezing or shortness of breath 6.7 g 0   alendronate (FOSAMAX) 70 MG tablet TAKE 1 TABLET BY MOUTH WEEKLY  WITH 8 OZ OF PLAIN WATER 30  MINUTES BEFORE FIRST FOOD, DRINK OR MEDS. STAY UPRIGHT FOR 30  MINS 8 tablet 4   amitriptyline (ELAVIL) 50 MG tablet TAKE 1 TABLET BY MOUTH AT  BEDTIME 90 tablet 2   atorvastatin (LIPITOR) 40 MG tablet TAKE 1 TABLET BY MOUTH DAILY 90 tablet 2   clopidogrel (PLAVIX) 75 MG tablet TAKE 1 TABLET BY MOUTH DAILY 90 tablet 2   donepezil (ARICEPT) 10 MG tablet Take 1 tablet (10 mg total) by mouth at bedtime. 90 tablet 3   DULoxetine (CYMBALTA) 60 MG capsule TAKE 1 CAPSULE BY MOUTH DAILY (Patient taking differently: Take 60 mg by mouth at bedtime.) 90 capsule 2   ibuprofen (ADVIL) 200 MG tablet Take 400 mg by mouth every 6 (six) hours as needed for moderate pain (pain score 4-6).     levothyroxine (SYNTHROID) 125 MCG tablet TAKE 1 TABLET BY MOUTH DAILY  BEFORE BREAKFAST 90 tablet 2   lisinopril (ZESTRIL) 40 MG tablet Take 1 tablet (40 mg total) by mouth daily. 90 tablet 3   triamcinolone cream (KENALOG) 0.1 % Apply 1 application topically 2 (two) times daily. (Patient taking differently: Apply 1 application  topically 2 (two) times daily as needed (Dermatitis).) 80 g 0     Discharge Medications: Please see discharge summary for a list of discharge medications.  Relevant Imaging Results:  Relevant Lab Results:   Additional Information SSN: [id number]  Isabella Bowens, LCSWA

## 2023-06-01 NOTE — Plan of Care (Signed)
  Problem: Acute Rehab OT Goals (only OT should resolve) Goal: Pt. Will Perform Grooming Flowsheets (Taken 06/01/2023 1030) Pt Will Perform Grooming:  with supervision  standing Goal: Pt. Will Perform Upper Body Dressing Flowsheets (Taken 06/01/2023 1030) Pt Will Perform Upper Body Dressing: with modified independence Goal: Pt. Will Perform Lower Body Dressing Flowsheets (Taken 06/01/2023 1030) Pt Will Perform Lower Body Dressing:  with contact guard assist  with min assist  sitting/lateral leans Goal: Pt. Will Transfer To Toilet Flowsheets (Taken 06/01/2023 1030) Pt Will Transfer to Toilet:  with modified independence  ambulating Goal: Pt. Will Perform Toileting-Clothing Manipulation Flowsheets (Taken 06/01/2023 1030) Pt Will Perform Toileting - Clothing Manipulation and hygiene:  with contact guard assist  with min assist  sitting/lateral leans Goal: Pt/Caregiver Will Perform Home Exercise Program Flowsheets (Taken 06/01/2023 1030) Pt/caregiver will Perform Home Exercise Program:  Increased strength  Both right and left upper extremity  Independently  Xochilth Standish OT, MOT

## 2023-06-01 NOTE — TOC CM/SW Note (Signed)
 To whom it May Concern:  Please be advised that the above name patient will require a short-term nursing home stay- anticipated 30 days or less rehabilitation and strengthening. The plan is for return home.

## 2023-06-01 NOTE — Plan of Care (Signed)
  Problem: Acute Rehab PT Goals(only PT should resolve) Goal: Pt Will Go Supine/Side To Sit Outcome: Progressing Flowsheets (Taken 06/01/2023 1156) Pt will go Supine/Side to Sit:  with modified independence  with supervision Goal: Patient Will Transfer Sit To/From Stand Outcome: Progressing Flowsheets (Taken 06/01/2023 1156) Patient will transfer sit to/from stand:  with modified independence  with supervision Goal: Pt Will Transfer Bed To Chair/Chair To Bed Outcome: Progressing Flowsheets (Taken 06/01/2023 1156) Pt will Transfer Bed to Chair/Chair to Bed:  with modified independence  with supervision Goal: Pt Will Ambulate Outcome: Progressing Flowsheets (Taken 06/01/2023 1156) Pt will Ambulate:  75 feet  with modified independence  with contact guard assist  Luz Lex, PT, DPT [facility] Office: [phone] 11:56 AM, 06/01/23

## 2023-06-01 NOTE — Progress Notes (Signed)
 PROGRESS NOTE    Laura Dalton  GNF:[id number] DOB: February 11, 1951 DOA: 05/31/2023 PCP: Dettinger, Elige Radon, MD   Brief Narrative:    Laura Dalton is a 73 y.o. female with medical history significant of type 2 diabetes mellitus, hypertension, hypothyroidism, hyperlipidemia who presents to the emergency department due to fall sustained while getting out of her car. Patient was admitted with pubic rami and L sacral fracture. PT recommending SNF placement for rehab.  Assessment & Plan:   Principal Problem:   Closed fracture of multiple pubic rami, left, initial encounter [facility]) Active Problems:   Essential hypertension   Mixed hyperlipidemia   Acquired hypothyroidism   Fall   Type 2 diabetes mellitus with hyperglycemia (HCC)  Assessment and Plan:  Closed fracture of multiple pubic rami, left, initial encounter Possible fracture of left sacral alla Fall Left hip x-ray showed acute fractures of the left inferior and superior pubic rami and possible fracture of the left sacral alla. Continue oxycodone 5 mg every 4 hours as needed Continue fall precaution Continue PT/OT eval and treat with recommendation for SNF  Hypokalemia Replete and re-evaluate   Essential hypertension Continue lisinopril   Mixed hyperlipidemia Continue Lipitor   Type 2 diabetes mellitus with hyperglycemia Hemoglobin A1c on 10/23/2022 was 5.9 indicating good blood glucose control Continue diet and lifestyle modification   Acquired hypothyroidism Continue Synthroid    DVT prophylaxis:Lovenox Code Status: Full Family Communication: DIL at bedside 3/21 Disposition Plan:  Status is: Inpatient Not inpatient appropriate, will call UM team and downgrade to OBS.   Consultants:  None  Procedures:  None  Antimicrobials:  None   Subjective: Patient seen and evaluated today with no new acute complaints or concerns. No acute concerns or events noted overnight. No significant pain while not  moving.  Objective: Vitals:   06/01/23 1001 06/01/23 1005 06/01/23 1100 06/01/23 1200  BP:  (!) 125/58 (!) 122/59 (!) 108/48  Pulse:  71 74 64  Resp:  15 16 15   Temp: 98.1 F (36.7 C)     TempSrc: Oral     SpO2:  95% 95% 94%  Weight:      Height:       No intake or output data in the 24 hours ending 06/01/23 1212 Filed Weights   05/31/23 1804  Weight: 74.8 kg    Examination:  General exam: Appears calm and comfortable  Respiratory system: Clear to auscultation. Respiratory effort normal. Cardiovascular system: S1 & S2 heard, RRR.  Gastrointestinal system: Abdomen is soft Central nervous system: Alert and awake Extremities: No edema Skin: No significant lesions noted Psychiatry: Flat affect.    Data Reviewed: I have personally reviewed following labs and imaging studies  CBC: Recent Labs  Lab 05/31/23 2042 06/01/23 0514  WBC 16.3* 11.4*  NEUTROABS 13.1*  --   HGB 14.0 11.9*  HCT 42.9 36.7  MCV 85.0 85.3  PLT 305 263   Basic Metabolic Panel: Recent Labs  Lab 05/31/23 2042 06/01/23 0514  NA 134* 135  K 3.3* 3.0*  CL 97* 102  CO2 26 27  GLUCOSE 129* 117*  BUN 8 11  CREATININE 0.66 0.58  CALCIUM 9.5 8.2*  MG  --  1.9  PHOS  --  3.3   GFR: Estimated Creatinine Clearance: 64.8 mL/min (by C-G formula based on SCr of 0.58 mg/dL). Liver Function Tests: Recent Labs  Lab 06/01/23 0514  AST 16  ALT 7  ALKPHOS 58  BILITOT 0.5  PROT 6.1*  ALBUMIN 3.5  No results for input(s): "LIPASE", "AMYLASE" in the last 168 hours. No results for input(s): "AMMONIA" in the last 168 hours. Coagulation Profile: No results for input(s): "INR", "PROTIME" in the last 168 hours. Cardiac Enzymes: No results for input(s): "CKTOTAL", "CKMB", "CKMBINDEX", "TROPONINI" in the last 168 hours. BNP (last 3 results) No results for input(s): "PROBNP" in the last 8760 hours. HbA1C: No results for input(s): "HGBA1C" in the last 72 hours. CBG: No results for input(s): "GLUCAP"  in the last 168 hours. Lipid Profile: No results for input(s): "CHOL", "HDL", "LDLCALC", "TRIG", "CHOLHDL", "LDLDIRECT" in the last 72 hours. Thyroid Function Tests: No results for input(s): "TSH", "T4TOTAL", "FREET4", "T3FREE", "THYROIDAB" in the last 72 hours. Anemia Panel: No results for input(s): "VITAMINB12", "FOLATE", "FERRITIN", "TIBC", "IRON", "RETICCTPCT" in the last 72 hours. Sepsis Labs: No results for input(s): "PROCALCITON", "LATICACIDVEN" in the last 168 hours.  No results found for this or any previous visit (from the past 240 hours).       Radiology Studies: DG Hip Unilat W or Wo Pelvis 2-3 Views Left Result Date: 05/31/2023 CLINICAL DATA:  Fall EXAM: DG HIP (WITH OR WITHOUT PELVIS) 2-3V LEFT COMPARISON:  12/11/2022 FINDINGS: Remote healed fracture of the right pubic rami. New acute fracture of the left inferior and superior pubic rami. Possible fracture of the left sacral ala. No fracture of the left proximal femur identified. 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 IMPRESSION: 1. Acute fractures of the left inferior and superior pubic rami. 2. Possible fracture of the left sacral ala. 3. Remote healed fracture of the right pubic rami. 4. 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 Electronically Signed   By: Gaylyn Rong M.D.   On: 05/31/2023 18:38        Scheduled Meds:  atorvastatin  40 mg Oral Daily   calcium carbonate  500 mg of elemental calcium Oral BID WC   enoxaparin (LOVENOX) injection  40 mg Subcutaneous Q24H   levothyroxine  125 mcg Oral Q0600   lisinopril  40 mg Oral Daily   potassium chloride  40 mEq Oral BID     LOS: 1 day    Time spent: 55 minutes    Eliud Polo Hoover Brunette, DO [facility]  If 7PM-7AM, please contact night-coverage [email or web address] 06/01/2023, 12:12 PM

## 2023-06-01 NOTE — Plan of Care (Signed)
  Problem: Education: Goal: Knowledge of General Education information will improve Description: Including pain rating scale, medication(s)/side effects and non-pharmacologic comfort measures Outcome: Progressing   Problem: Clinical Measurements: Goal: Will remain free from infection Outcome: Progressing   Problem: Nutrition: Goal: Adequate nutrition will be maintained Outcome: Progressing   Problem: Activity: Goal: Risk for activity intolerance will decrease Outcome: Progressing

## 2023-06-02 DIAGNOSIS — S32592A Other specified fracture of left pubis, initial encounter for closed fracture: Secondary | ICD-10-CM | POA: Diagnosis not present

## 2023-06-02 LAB — BASIC METABOLIC PANEL
Anion gap: 7 (ref 5–15)
BUN: 7 mg/dL — ABNORMAL LOW (ref 8–23)
CO2: 26 mmol/L (ref 22–32)
Calcium: 10.1 mg/dL (ref 8.9–10.3)
Chloride: 101 mmol/L (ref 98–111)
Creatinine, Ser: 0.64 mg/dL (ref 0.44–1.00)
GFR, Estimated: >60 mL/min (ref >60)
Glucose, Bld: 103 mg/dL — ABNORMAL HIGH (ref 70–99)
Potassium: 4.4 mmol/L (ref 3.5–5.1)
Sodium: 134 mmol/L — ABNORMAL LOW (ref 135–145)

## 2023-06-02 LAB — MAGNESIUM: Magnesium: 1.8 mg/dL (ref 1.7–2.4)

## 2023-06-02 LAB — CBC
HCT: 37 % (ref 36.0–46.0)
Hemoglobin: 11.9 g/dL — ABNORMAL LOW (ref 12.0–15.0)
MCH: 27.7 pg (ref 26.0–34.0)
MCHC: 32.2 g/dL (ref 30.0–36.0)
MCV: 86.2 fL (ref 80.0–100.0)
Platelets: 262 10*3/uL (ref 150–400)
RBC: 4.29 MIL/uL (ref 3.87–5.11)
RDW: 13.2 % (ref 11.5–15.5)
WBC: 11.2 10*3/uL — ABNORMAL HIGH (ref 4.0–10.5)
nRBC: 0 % (ref 0.0–0.2)

## 2023-06-02 MED ORDER — CALCIUM CARBONATE 1500 (600 CA) MG PO TABS: 1500.0000 mg | ORAL_TABLET | Freq: Two times a day (BID) | ORAL | 0 refills | Status: AC

## 2023-06-02 MED ORDER — OXYCODONE HCL 5 MG PO TABS: 5.0000 mg | ORAL_TABLET | Freq: Three times a day (TID) | ORAL | 0 refills | Status: DC | PRN

## 2023-06-02 MED ORDER — METHOCARBAMOL 500 MG PO TABS: 500.0000 mg | ORAL_TABLET | Freq: Three times a day (TID) | ORAL | 0 refills | Status: DC | PRN

## 2023-06-02 NOTE — Discharge Summary (Signed)
 Physician Discharge Summary  Laura Dalton ZHY:[id number] DOB: 04-Jul-1950 DOA: 05/31/2023  PCP: Dettinger, Elige Radon, MD  Admit date: 05/31/2023  Discharge date: 06/02/2023  Admitted From: Home  Disposition: Home  Recommendations for Outpatient Follow-up:  Follow up with PCP in 1-2 weeks Follow-up with Dr. Romeo Apple in 6 weeks for repeat imaging TED hose for DVT prophylaxis Continue on pain medications as noted below Continue home medications as prior  Home Health: Yes with PT/OT, declined SNF  Equipment/Devices: None  Discharge Condition:Stable  CODE STATUS: Full  Diet recommendation: Heart Healthy/carb modified  Brief/Interim Summary: Laura Dalton is a 73 y.o. female with medical history significant of type 2 diabetes mellitus, hypertension, hypothyroidism, hyperlipidemia who presents to the emergency department due to fall sustained while getting out of her car. Patient was admitted with pubic rami and L sacral fracture. PT recommending SNF placement for rehab.  She was also seen by orthopedics Dr. Romeo Apple with recommendations to follow-up in clinic in 6 weeks for repeat imaging.  No need for acute operative intervention.  She now declines SNF and would like to go home with home health services which have not been arranged.  No other acute events or concerns noted.  Discharge Diagnoses:  Principal Problem:   Closed fracture of multiple pubic rami, left, initial encounter [facility]) Active Problems:   Essential hypertension   Mixed hyperlipidemia   Acquired hypothyroidism   Fall   Type 2 diabetes mellitus with hyperglycemia (HCC)  Principal discharge diagnosis: Closed fracture of multiple pubic rami as well as left sacral ala secondary to mechanical fall.  Discharge Instructions  Discharge Instructions     Ambulatory referral to Orthopedic Surgery   Complete by: As directed    Diet - low sodium heart healthy   Complete by: As directed    Increase activity slowly    Complete by: As directed       Allergies as of 06/02/2023       Reactions   Aspirin Hives   Gabapentin Other (See Comments)   Dizzy,falls        Medication List     TAKE these medications    albuterol 108 (90 Base) MCG/ACT inhaler Commonly known as: VENTOLIN HFA 2 puffs every 6 hours as needed for wheezing or shortness of breath   alendronate 70 MG tablet Commonly known as: FOSAMAX TAKE 1 TABLET BY MOUTH WEEKLY  WITH 8 OZ OF PLAIN WATER 30  MINUTES BEFORE FIRST FOOD, DRINK OR MEDS. STAY UPRIGHT FOR 30  MINS   amitriptyline 50 MG tablet Commonly known as: ELAVIL TAKE 1 TABLET BY MOUTH AT  BEDTIME   atorvastatin 40 MG tablet Commonly known as: LIPITOR TAKE 1 TABLET BY MOUTH DAILY   calcium carbonate 1500 (600 Ca) MG Tabs tablet Commonly known as: OSCAL Take 1 tablet (1,500 mg total) by mouth 2 (two) times daily with a meal.   clopidogrel 75 MG tablet Commonly known as: PLAVIX TAKE 1 TABLET BY MOUTH DAILY   donepezil 10 MG tablet Commonly known as: ARICEPT Take 1 tablet (10 mg total) by mouth at bedtime.   DULoxetine 60 MG capsule Commonly known as: CYMBALTA TAKE 1 CAPSULE BY MOUTH DAILY What changed: when to take this   ibuprofen 200 MG tablet Commonly known as: ADVIL Take 400 mg by mouth every 6 (six) hours as needed for moderate pain (pain score 4-6).   levothyroxine 125 MCG tablet Commonly known as: SYNTHROID TAKE 1 TABLET BY MOUTH DAILY  BEFORE BREAKFAST  lisinopril 40 MG tablet Commonly known as: ZESTRIL Take 1 tablet (40 mg total) by mouth daily.   methocarbamol 500 MG tablet Commonly known as: ROBAXIN Take 1 tablet (500 mg total) by mouth every 8 (eight) hours as needed for muscle spasms.   oxyCODONE 5 MG immediate release tablet Commonly known as: Roxicodone Take 1 tablet (5 mg total) by mouth every 8 (eight) hours as needed for moderate pain (pain score 4-6), severe pain (pain score 7-10) or breakthrough pain.   triamcinolone cream 0.1  % Commonly known as: KENALOG Apply 1 application topically 2 (two) times daily. What changed:  when to take this reasons to take this        Follow-up Information     Dettinger, Elige Radon, MD. Schedule an appointment as soon as possible for a visit in 1 week(s).   Specialties: Family Medicine, Cardiology Contact information: [address] [place] [address] [phone]         Vickki Hearing, MD Follow up in 6 week(s).   Specialties: Orthopedic Surgery, Radiology Contact information: [address] [place] [address] [phone]                Allergies  Allergen Reactions   Aspirin Hives   Gabapentin Other (See Comments)    Dizzy,falls    Consultations: Orthopedics   Procedures/Studies: DG Hip Unilat W or Wo Pelvis 2-3 Views Left Result Date: 05/31/2023 CLINICAL DATA:  Fall EXAM: DG HIP (WITH OR WITHOUT PELVIS) 2-3V LEFT COMPARISON:  12/11/2022 FINDINGS: Remote healed fracture of the right pubic rami. New acute fracture of the left inferior and superior pubic rami. Possible fracture of the left sacral ala. No fracture of the left proximal femur identified. 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 IMPRESSION: 1. Acute fractures of the left inferior and superior pubic rami. 2. Possible fracture of the left sacral ala. 3. Remote healed fracture of the right pubic rami. 4. 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 Electronically Signed   By: Gaylyn Rong M.D.   On: 05/31/2023 18:38     Discharge Exam: Vitals:   06/01/23 2308 06/02/23 0338  BP: (!) 117/59 (!) 137/53  Pulse: 74 72  Resp: 16 18  Temp: 98.9 F (37.2 C) 98.5 F (36.9 C)  SpO2: 99% 93%   Vitals:   06/01/23 2032 06/01/23 2038 06/01/23 2308 06/02/23 0338  BP: (!) 114/45  (!) 117/59 (!) 137/53  Pulse: 74  74 72  Resp: 16  16 18   Temp:  98.1 F (36.7 C) 98.9 F (37.2 C) 98.5 F (36.9 C)  TempSrc:  Oral Oral Oral  SpO2: 97%  99% 93%  Weight:      Height:         General: Pt is alert, awake, not in acute distress Cardiovascular: RRR, S1/S2 +, no rubs, no gallops Respiratory: CTA bilaterally, no wheezing, no rhonchi Abdominal: Soft, NT, ND, bowel sounds + Extremities: no edema, no cyanosis    The results of significant diagnostics from this hospitalization (including imaging, microbiology, ancillary and laboratory) are listed below for reference.     Microbiology: No results found for this or any previous visit (from the past 240 hours).   Labs: BNP (last 3 results) No results for input(s): "BNP" in the last 8760 hours. Basic Metabolic Panel: Recent Labs  Lab 05/31/23 2042 06/01/23 0514 06/02/23 0403  NA 134* 135 134*  K 3.3* 3.0* 4.4  CL 97* 102 101  CO2  26 27 26   GLUCOSE 129* 117* 103*  BUN 8 11 7*  CREATININE 0.66 0.58 0.64  CALCIUM 9.5 8.2* 10.1  MG  --  1.9 1.8  PHOS  --  3.3  --    Liver Function Tests: Recent Labs  Lab 06/01/23 0514  AST 16  ALT 7  ALKPHOS 58  BILITOT 0.5  PROT 6.1*  ALBUMIN 3.5   No results for input(s): "LIPASE", "AMYLASE" in the last 168 hours. No results for input(s): "AMMONIA" in the last 168 hours. CBC: Recent Labs  Lab 05/31/23 2042 06/01/23 0514 06/02/23 0403  WBC 16.3* 11.4* 11.2*  NEUTROABS 13.1*  --   --   HGB 14.0 11.9* 11.9*  HCT 42.9 36.7 37.0  MCV 85.0 85.3 86.2  PLT 305 263 262   Cardiac Enzymes: No results for input(s): "CKTOTAL", "CKMB", "CKMBINDEX", "TROPONINI" in the last 168 hours. BNP: Invalid input(s): "POCBNP" CBG: No results for input(s): "GLUCAP" in the last 168 hours. D-Dimer No results for input(s): "DDIMER" in the last 72 hours. Hgb A1c No results for input(s): "HGBA1C" in the last 72 hours. Lipid Profile No results for input(s): "CHOL", "HDL", "LDLCALC", "TRIG", "CHOLHDL", "LDLDIRECT" in the last 72 hours. Thyroid function studies No results for input(s): "TSH", "T4TOTAL", "T3FREE", "THYROIDAB" in the last 72 hours.  Invalid input(s):  "FREET3" Anemia work up No results for input(s): "VITAMINB12", "FOLATE", "FERRITIN", "TIBC", "IRON", "RETICCTPCT" in the last 72 hours. Urinalysis    Component Value Date/Time   COLORURINE YELLOW 12/11/2022 1742   APPEARANCEUR CLEAR 12/11/2022 1742   APPEARANCEUR Clear 07/28/2021 1634   LABSPEC 1.010 12/11/2022 1742   PHURINE 6.0 12/11/2022 1742   GLUCOSEU NEGATIVE 12/11/2022 1742   HGBUR SMALL (A) 12/11/2022 1742   BILIRUBINUR NEGATIVE 12/11/2022 1742   BILIRUBINUR Negative 07/28/2021 1634   KETONESUR NEGATIVE 12/11/2022 1742   PROTEINUR NEGATIVE 12/11/2022 1742   UROBILINOGEN negative 04/30/2015 1313   NITRITE NEGATIVE 12/11/2022 1742   LEUKOCYTESUR NEGATIVE 12/11/2022 1742   Sepsis Labs Recent Labs  Lab 05/31/23 2042 06/01/23 0514 06/02/23 0403  WBC 16.3* 11.4* 11.2*   Microbiology No results found for this or any previous visit (from the past 240 hours).   Time coordinating discharge: 35 minutes  SIGNED:   Erick Blinks, DO [facility] 06/02/2023, 11:30 AM  If 7PM-7AM, please contact night-coverage [email or web address]

## 2023-06-02 NOTE — TOC Transition Note (Signed)
 Transition of Care [facility]) - Discharge Note   Patient Details  Name: Laura Dalton MRN: [id number] Date of Birth: 1950/03/20  Transition of [facility]) CM/SW Contact:  Villa Herb, LCSWA Phone Number: 06/02/2023, 1:40 PM  Clinical Narrative:    CSW updated that pt is requesting to return home with [facility] services. CSW met with pt at bedside to review SNF vs HH. Pt states that she plans to return home with [facility] services and does not have an agency preference. CSW spoke to [place] with [facility] who accepts [facility] PT/OT referral, MD placed HH orders. Pt confirms she has 3 walkers in the home to use if needed. TOC signing off.    Final next level of care: Home w Home Health Services Barriers to Discharge: Barriers Resolved   Patient Goals and CMS Choice Patient states their goals for this hospitalization and ongoing recovery are:: Return home CMS Medicare.gov Compare Post Acute Care list provided to:: Patient Choice offered to / list presented to : Patient [place] ownership interest in [facility].provided to:: Adult Children (Daughter - Oceanographer)    Discharge Placement                       Discharge Plan and Services Additional resources added to the After Visit Summary for   In-house Referral: Clinical Social Work                        HH Arranged: PT, OT HH Agency: [facility] Care Date [facility] Agency Contacted: 06/02/23   Representative spoke with at [facility] Agency: Kandee Keen  Social Drivers of Health (SDOH) Interventions SDOH Screenings   Food Insecurity: No Food Insecurity (06/01/2023)  Housing: Low Risk  (06/01/2023)  Transportation Needs: No Transportation Needs (06/01/2023)  Utilities: Not At Risk (06/01/2023)  Alcohol Screen: Low Risk  (01/31/2023)  Depression (PHQ2-9): Medium Risk (02/02/2023)  Financial Resource Strain: Low Risk  (01/31/2023)  Physical Activity: Inactive (01/31/2023)  Social Connections: Moderately Integrated (06/01/2023)  Stress: No Stress  Concern Present (01/31/2023)  Tobacco Use: Medium Risk (05/31/2023)  Health Literacy: Inadequate Health Literacy (01/31/2023)     Readmission Risk Interventions    06/02/2023    1:39 PM 06/01/2023    1:04 PM  Readmission Risk Prevention Plan  Medication Screening Complete Complete  Transportation Screening Complete Complete

## 2023-06-02 NOTE — Consult Note (Addendum)
 Reason for Consult: Left pelvic fracture Referring Physician: Dr. Dena Billet Laura Dalton is an 73 y.o. female.  HPI: 73 year old female tells me she has fallen over 20 times she says she is very clumsy.  She has a left pubic ramus fracture and complains of pain on the left side.  She is normally ambulatory she says she has 3 walkers at home.  Date of injury date of admission 05/31/2023   Past Medical History:  Diagnosis Date   Bursitis of hip    Diabetes mellitus without complication (HCC)    type II   Hyperlipidemia    Hypertension    Hypothyroidism    Low back pain    benign turmor back   Stroke (HCC)    Vitamin D deficiency disease     Past Surgical History:  Procedure Laterality Date   APPENDECTOMY     BACK SURGERY  1996   turmor on back    CHOLECYSTECTOMY     LEG SURGERY Right    "clot and had drain in leg  rt lower leg    Family History  Problem Relation Age of Onset   Diabetes Mother    Stroke Mother    Seizures Mother    Cancer Father        prostate   COPD Sister    COPD Sister    COPD Sister    Cancer Brother        lung   Cancer Brother        prostate   COPD Brother    COPD Brother    Thyroid disease Son    Hashimoto's thyroiditis Son    Breast cancer Neg Hx     Social History:  reports that she quit smoking about 15 years ago. Her smoking use included cigarettes. She started smoking about 60 years ago. She has a 22.6 pack-year smoking history. She has never used smokeless tobacco. She reports that she does not drink alcohol and does not use drugs.  Allergies:  Allergies  Allergen Reactions   Aspirin Hives   Gabapentin Other (See Comments)    Dizzy,falls    Medications: I have reviewed the patient's current medications.  All lab studies were reviewed   Results for orders placed or performed during the hospital encounter of 05/31/23 (from the past 48 hours)  CBC with Differential     Status: Abnormal   Collection Time: 05/31/23  8:42  PM  Result Value Ref Range   WBC 16.3 (H) 4.0 - 10.5 K/uL   RBC 5.05 3.87 - 5.11 MIL/uL   Hemoglobin 14.0 12.0 - 15.0 g/dL   HCT 45.4 09.8 - 11.9 %   MCV 85.0 80.0 - 100.0 fL   MCH 27.7 26.0 - 34.0 pg   MCHC 32.6 30.0 - 36.0 g/dL   RDW 14.7 82.9 - 56.2 %   Platelets 305 150 - 400 K/uL   nRBC 0.0 0.0 - 0.2 %   Neutrophils Relative % 81 %   Neutro Abs 13.1 (H) 1.7 - 7.7 K/uL   Lymphocytes Relative 8 %   Lymphs Abs 1.3 0.7 - 4.0 K/uL   Monocytes Relative 9 %   Monocytes Absolute 1.5 (H) 0.1 - 1.0 K/uL   Eosinophils Relative 2 %   Eosinophils Absolute 0.3 0.0 - 0.5 K/uL   Basophils Relative 0 %   Basophils Absolute 0.0 0.0 - 0.1 K/uL   Immature Granulocytes 0 %   Abs Immature Granulocytes 0.07 0.00 - 0.07 K/uL  Comment: Performed at [facility], [address], [place], [place] [address]  Basic metabolic panel     Status: Abnormal   Collection Time: 05/31/23  8:42 PM  Result Value Ref Range   Sodium 134 (L) 135 - 145 mmol/L   Potassium 3.3 (L) 3.5 - 5.1 mmol/L   Chloride 97 (L) 98 - 111 mmol/L   CO2 26 22 - 32 mmol/L   Glucose, Bld 129 (H) 70 - 99 mg/dL    Comment: Glucose reference range applies only to samples taken after fasting for at least 8 hours.   BUN 8 8 - 23 mg/dL   Creatinine, Ser 2.84 0.44 - 1.00 mg/dL   Calcium 9.5 8.9 - 13.2 mg/dL   GFR, Estimated >44 >01 mL/min    Comment: (NOTE) Calculated using the CKD-EPI Creatinine Equation (2021)    Anion gap 11 5 - 15    Comment: Performed at [facility], [address] [place], [place] [address]  Comprehensive metabolic panel     Status: Abnormal   Collection Time: 06/01/23  5:14 AM  Result Value Ref Range   Sodium 135 135 - 145 mmol/L   Potassium 3.0 (L) 3.5 - 5.1 mmol/L   Chloride 102 98 - 111 mmol/L   CO2 27 22 - 32 mmol/L   Glucose, Bld 117 (H) 70 - 99 mg/dL    Comment: Glucose reference range applies only to samples taken after fasting for at least 8 hours.   BUN 11 8 - 23 mg/dL   Creatinine, Ser 3.66  0.44 - 1.00 mg/dL   Calcium 8.2 (L) 8.9 - 10.3 mg/dL   Total Protein 6.1 (L) 6.5 - 8.1 g/dL   Albumin 3.5 3.5 - 5.0 g/dL   AST 16 15 - 41 U/L   ALT 7 0 - 44 U/L   Alkaline Phosphatase 58 38 - 126 U/L   Total Bilirubin 0.5 0.0 - 1.2 mg/dL   GFR, Estimated >44 >03 mL/min    Comment: (NOTE) Calculated using the CKD-EPI Creatinine Equation (2021)    Anion gap 6 5 - 15    Comment: Performed at [facility], [address] [place], [place] [address]  CBC     Status: Abnormal   Collection Time: 06/01/23  5:14 AM  Result Value Ref Range   WBC 11.4 (H) 4.0 - 10.5 K/uL   RBC 4.30 3.87 - 5.11 MIL/uL   Hemoglobin 11.9 (L) 12.0 - 15.0 g/dL   HCT 95.6 38.7 - 56.4 %   MCV 85.3 80.0 - 100.0 fL   MCH 27.7 26.0 - 34.0 pg   MCHC 32.4 30.0 - 36.0 g/dL   RDW 33.2 95.1 - 88.4 %   Platelets 263 150 - 400 K/uL   nRBC 0.0 0.0 - 0.2 %    Comment: Performed at [facility], [address] [place], [place] [address]  Magnesium     Status: None   Collection Time: 06/01/23  5:14 AM  Result Value Ref Range   Magnesium 1.9 1.7 - 2.4 mg/dL    Comment: Performed at [facility], [address] [place], [place] [address]  Phosphorus     Status: None   Collection Time: 06/01/23  5:14 AM  Result Value Ref Range   Phosphorus 3.3 2.5 - 4.6 mg/dL    Comment: Performed at [facility], [address], [place], [place] [address]  CBC     Status: Abnormal   Collection Time: 06/02/23  4:03 AM  Result Value Ref  Range   WBC 11.2 (H) 4.0 - 10.5 K/uL   RBC 4.29 3.87 - 5.11 MIL/uL   Hemoglobin 11.9 (L) 12.0 - 15.0 g/dL   HCT 16.1 09.6 - 04.5 %   MCV 86.2 80.0 - 100.0 fL   MCH 27.7 26.0 - 34.0 pg   MCHC 32.2 30.0 - 36.0 g/dL   RDW 40.9 81.1 - 91.4 %   Platelets 262 150 - 400 K/uL   nRBC 0.0 0.0 - 0.2 %    Comment: Performed at [facility], [address], [place], [place] [address]  Basic metabolic panel     Status: Abnormal   Collection Time: 06/02/23  4:03 AM  Result Value Ref Range   Sodium 134 (L) 135 - 145  mmol/L   Potassium 4.4 3.5 - 5.1 mmol/L   Chloride 101 98 - 111 mmol/L   CO2 26 22 - 32 mmol/L   Glucose, Bld 103 (H) 70 - 99 mg/dL    Comment: Glucose reference range applies only to samples taken after fasting for at least 8 hours.   BUN 7 (L) 8 - 23 mg/dL   Creatinine, Ser 6.21 0.44 - 1.00 mg/dL   Calcium 30.8 8.9 - 65.7 mg/dL   GFR, Estimated >84 >69 mL/min    Comment: (NOTE) Calculated using the CKD-EPI Creatinine Equation (2021)    Anion gap 7 5 - 15    Comment: Performed at [facility], [address], [place], [place] [address]  Magnesium     Status: None   Collection Time: 06/02/23  4:03 AM  Result Value Ref Range   Magnesium 1.8 1.7 - 2.4 mg/dL    Comment: Performed at [facility], [address] [place], [place] [address]    Physician interpretation of imaging:  Pelvis and left hip we note a remote healed fracture in the right pubic ramus some degenerative disc disease at L5-S1 left inferior and superior pubic rami fracture with left sacral ala fracture consistent with lateral compression injury   DG Hip Unilat W or Wo Pelvis 2-3 Views Left Result Date: 05/31/2023 CLINICAL DATA:  Fall EXAM: DG HIP (WITH OR WITHOUT PELVIS) 2-3V LEFT COMPARISON:  12/11/2022 FINDINGS: Remote healed fracture of the right pubic rami. New acute fracture of the left inferior and superior pubic rami. Possible fracture of the left sacral ala. No fracture of the left proximal femur identified. 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 IMPRESSION: 1. Acute fractures of the left inferior and superior pubic rami. 2. Possible fracture of the left sacral ala. 3. Remote healed fracture of the right pubic rami. 4. 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 Electronically Signed   By: Gaylyn Rong M.D.   On: 05/31/2023 18:38    Review of Systems  Constitutional:  Negative for fever and unexpected weight change.  Gastrointestinal: Negative.   Genitourinary: Negative.   Musculoskeletal:         Frequent falls  Neurological:  Negative for numbness.   Blood pressure (!) 137/53, pulse 72, temperature 98.5 F (36.9 C), temperature source Oral, resp. rate 18, height 5\' 6"  (1.676 m), weight 74.8 kg, SpO2 93%. Physical Exam Vitals and nursing note reviewed.  Constitutional:      General: She is not in acute distress.    Appearance: Normal appearance. She is normal weight. She is not ill-appearing, toxic-appearing or diaphoretic.  HENT:     Head: Normocephalic and atraumatic.     Nose: No congestion or rhinorrhea.  Mouth/Throat:     Mouth: Mucous membranes are moist.     Pharynx: No oropharyngeal exudate or posterior oropharyngeal erythema.  Eyes:     General: No scleral icterus.       Right eye: No discharge.        Left eye: No discharge.     Extraocular Movements: Extraocular movements intact.     Conjunctiva/sclera: Conjunctivae normal.     Pupils: Pupils are equal, round, and reactive to light.  Cardiovascular:     Rate and Rhythm: Normal rate and regular rhythm.     Pulses: Normal pulses.  Pulmonary:     Effort: Pulmonary effort is normal.  Abdominal:     General: Abdomen is flat. There is no distension.     Palpations: Abdomen is soft.  Musculoskeletal:     Cervical back: Normal range of motion.     Comments: Left lower extremity right lower extremity leg lengths are equal.  Normal alignment is noted in both lower extremities.  Painful range of motion left lower extremity normal range of motion without pain right lower extremity  Upper extremities normal range of motion and strength stability alignment skin normal  Skin:    Capillary Refill: Capillary refill takes more than 3 seconds.  Neurological:     General: No focal deficit present.     Mental Status: She is alert and oriented to person, place, and time. Mental status is at baseline.     Cranial Nerves: No cranial nerve deficit.     Motor: No weakness.     Coordination: Coordination normal.     Gait: Gait  normal.     Deep Tendon Reflexes: Reflexes normal.  Psychiatric:        Mood and Affect: Mood normal.        Behavior: Behavior normal.        Thought Content: Thought content normal.        Judgment: Judgment normal.     Assessment/Plan: Lateral compression pelvic fracture type I with sacral ala involvement  The fracture can be treated nonoperatively with appropriate pain medication monitoring of hemoglobin and weight-bear as tolerated  Orthopedic follow-up  X-rays in 6 weeks Aspirin can be used as DVT prophylaxis  Fuller Canada 06/02/2023, 8:57 AM   [phone]

## 2023-06-06 ENCOUNTER — Telehealth: Payer: Self-pay | Admitting: Family Medicine

## 2023-06-06 NOTE — Telephone Encounter (Unsigned)
 Copied from CRM [id number]. Topic: Clinical - Medical Advice >> Jun 06, 2023 12:08 PM Carlatta H wrote: Reason for CRM: Patient would like a call back from the nurse about setting up home health care assistance//She has a broken pelvic bone

## 2023-06-07 NOTE — Telephone Encounter (Signed)
 Informed pt that the hospital placed a order for [facility] [place], if she does not her from an agency in the next couple of days to give [place] a call back and make a hospital FU appt so that we can place the [facility] order.

## 2023-06-08 ENCOUNTER — Other Ambulatory Visit: Payer: Self-pay | Admitting: Family Medicine

## 2023-06-14 ENCOUNTER — Encounter (INDEPENDENT_AMBULATORY_CARE_PROVIDER_SITE_OTHER): Payer: Self-pay

## 2023-06-14 DIAGNOSIS — M47817 Spondylosis without myelopathy or radiculopathy, lumbosacral region: Secondary | ICD-10-CM | POA: Diagnosis not present

## 2023-06-14 DIAGNOSIS — I10 Essential (primary) hypertension: Secondary | ICD-10-CM | POA: Diagnosis not present

## 2023-06-14 DIAGNOSIS — E119 Type 2 diabetes mellitus without complications: Secondary | ICD-10-CM | POA: Diagnosis not present

## 2023-06-14 DIAGNOSIS — S32592D Other specified fracture of left pubis, subsequent encounter for fracture with routine healing: Secondary | ICD-10-CM | POA: Diagnosis not present

## 2023-06-14 DIAGNOSIS — S32119D Unspecified Zone I fracture of sacrum, subsequent encounter for fracture with routine healing: Secondary | ICD-10-CM | POA: Diagnosis not present

## 2023-06-19 ENCOUNTER — Telehealth: Payer: Self-pay

## 2023-06-19 DIAGNOSIS — S32119D Unspecified Zone I fracture of sacrum, subsequent encounter for fracture with routine healing: Secondary | ICD-10-CM | POA: Diagnosis not present

## 2023-06-19 DIAGNOSIS — M47817 Spondylosis without myelopathy or radiculopathy, lumbosacral region: Secondary | ICD-10-CM | POA: Diagnosis not present

## 2023-06-19 DIAGNOSIS — I10 Essential (primary) hypertension: Secondary | ICD-10-CM | POA: Diagnosis not present

## 2023-06-19 DIAGNOSIS — S32592D Other specified fracture of left pubis, subsequent encounter for fracture with routine healing: Secondary | ICD-10-CM | POA: Diagnosis not present

## 2023-06-19 DIAGNOSIS — E119 Type 2 diabetes mellitus without complications: Secondary | ICD-10-CM | POA: Diagnosis not present

## 2023-06-19 NOTE — Telephone Encounter (Signed)
 Copied from CRM [id number]. Topic: Clinical - Home Health Verbal Orders >> Jun 19, 2023 10:25 AM Elle L wrote: Caller/Agency: Stacy Occupational Therapist with [facility] Callback Number: [phone] Service Requested: Occupational Therapy Frequency: She called to advise that the patient will no longer need Occupational Therapy services. Any new concerns about the patient? No

## 2023-06-21 ENCOUNTER — Other Ambulatory Visit: Payer: Self-pay

## 2023-06-21 ENCOUNTER — Ambulatory Visit: Payer: Self-pay | Admitting: *Deleted

## 2023-06-21 DIAGNOSIS — M47817 Spondylosis without myelopathy or radiculopathy, lumbosacral region: Secondary | ICD-10-CM | POA: Diagnosis not present

## 2023-06-21 DIAGNOSIS — S32119D Unspecified Zone I fracture of sacrum, subsequent encounter for fracture with routine healing: Secondary | ICD-10-CM | POA: Diagnosis not present

## 2023-06-21 DIAGNOSIS — I10 Essential (primary) hypertension: Secondary | ICD-10-CM | POA: Diagnosis not present

## 2023-06-21 DIAGNOSIS — E119 Type 2 diabetes mellitus without complications: Secondary | ICD-10-CM | POA: Diagnosis not present

## 2023-06-21 DIAGNOSIS — S32592D Other specified fracture of left pubis, subsequent encounter for fracture with routine healing: Secondary | ICD-10-CM | POA: Diagnosis not present

## 2023-06-21 NOTE — Patient Instructions (Signed)
 Visit Information  Thank you for taking time to visit with me today. Please don't hesitate to contact me if I can be of assistance to you before our next scheduled appointment.  Your next care management appointment is by telephone on 07/11/23 at 2 pm  Will be outreached by Danise Edge 336 [phone] new [facility] RN care manager  Please call the care guide team at [phone] if you need to cancel, schedule, or reschedule an appointment.   Please call the Suicide and Crisis Lifeline: 988 call the [place] National Suicide Prevention Lifeline: (475)099-0477 or TTY: 415-847-2540 TTY 747-615-3871) to talk to a trained counselor call 1-800-273-TALK (toll free, 24 hour hotline) call the [facility]: [phone] call 911 if you are experiencing a Mental Health or Behavioral Health Crisis or need someone to talk to.   Yesennia Hirota L. Noelle Penner, RN, BSN, CCM [place]  [facility], [facility] RN Care Manager Direct Dial: [phone]  Fax: [phone]

## 2023-06-21 NOTE — Patient Outreach (Signed)
 Complex Care Management   Visit Note  06/21/2023  Name:  Laura Dalton MRN: [id number] DOB: [birth year]  Situation: Referral received for Complex Care Management related to Stroke, COPD, Diabetes with Complications, and falls  I obtained verbal consent from Charmain Puller.  Visit completed with Shamel Bonaventura  on the phone  Background:   Past Medical History:  Diagnosis Date   Bursitis of hip    Diabetes mellitus without complication (HCC)    type II   Hyperlipidemia    Hypertension    Hypothyroidism    Low back pain    benign turmor back   Stroke (HCC)    Vitamin D deficiency disease     Assessment: Patient Reported Symptoms:  Cognitive Alert and oriented to person, place, and time, Able to follow simple commands  Neurological Vision changes    HEENT Sudden change or loss of vision    Cardiovascular Swelling in legs or feet (left leg swelling after fracture)    Respiratory Productive cough, Shortness of breath denies asthma COPD but during the call cough with productive sputum discussed  Endocrine No symptoms reported    Gastrointestinal No symptoms reported    Genitourinary No symptoms reported    Integumentary No symptoms reported    Musculoskeletal Difficulty walking, Unsteady gait    Psychosocial No symptoms reported     There were no vitals filed for this visit.  Medications Reviewed Today     Reviewed by Clinton Gallant, RN (Registered Nurse) on 06/21/23 at 1308  Med List Status: <None>   Medication Order Taking? Sig Documenting Provider Last Dose Status Informant  albuterol (VENTOLIN HFA) 108 (90 Base) MCG/ACT inhaler [id number] Yes 2 puffs every 6 hours as needed for wheezing or shortness of breath Dettinger, Elige Radon, MD Taking Active Self, Child, Pharmacy Records  alendronate (FOSAMAX) 70 MG tablet [id number]  TAKE 1 TABLET BY MOUTH WEEKLY  WITH 8 OZ OF PLAIN WATER 30  MINUTES BEFORE FIRST FOOD, DRINK OR MEDS. STAY UPRIGHT FOR 30  MINS Dettinger, Elige Radon,  MD  Active Self, Child, Pharmacy Records  amitriptyline (ELAVIL) 50 MG tablet [id number]  TAKE 1 TABLET BY MOUTH AT  BEDTIME Dettinger, Elige Radon, MD  Active Self, Child, Pharmacy Records  atorvastatin (LIPITOR) 40 MG tablet [id number]  TAKE 1 TABLET BY MOUTH DAILY Dettinger, Elige Radon, MD  Active Self, Child, Pharmacy Records  calcium carbonate (OSCAL) 1500 (600 Ca) MG TABS tablet [id number]  Take 1 tablet (1,500 mg total) by mouth 2 (two) times daily with a meal. Sherryll Burger, Pratik D, DO  Active   clopidogrel (PLAVIX) 75 MG tablet [id number]  TAKE 1 TABLET BY MOUTH DAILY Dettinger, Elige Radon, MD  Active Self, Child, Pharmacy Records  donepezil (ARICEPT) 10 MG tablet [id number]  Take 1 tablet (10 mg total) by mouth at bedtime. Dettinger, Elige Radon, MD  Active Self, Child, Pharmacy Records           Med Note Jola Schmidt   Fri Jun 01, 2023 10:40 AM) No dispense records. Both daughter and patient state she is taking.  DULoxetine (CYMBALTA) 60 MG capsule [id number]  TAKE 1 CAPSULE BY MOUTH DAILY  Patient taking differently: Take 60 mg by mouth at bedtime.   Dettinger, Elige Radon, MD  Active Self, Child, Pharmacy Records           Med Note Jola Schmidt   Fri Jun 01, 2023 10:40 AM) Daughter and patient confirm she is taking. Dispense records do not  support claim.   ibuprofen (ADVIL) 200 MG tablet [id number]  Take 400 mg by mouth every 6 (six) hours as needed for moderate pain (pain score 4-6). [provider]  Active Self, Child, Pharmacy Records  levothyroxine (SYNTHROID) 125 MCG tablet [id number] Yes TAKE 1 TABLET BY MOUTH DAILY  BEFORE BREAKFAST Dettinger, Elige Radon, MD Taking Active Self, Child, Pharmacy Records  lisinopril (ZESTRIL) 40 MG tablet [id number]  Take 1 tablet (40 mg total) by mouth daily. Dettinger, Elige Radon, MD  Active Self, Child, Pharmacy Records  methocarbamol (ROBAXIN) 500 MG tablet [id number]  Take 1 tablet (500 mg total) by mouth every 8 (eight) hours as needed for muscle spasms.  Sherryll Burger, Pratik D, DO  Active   oxyCODONE (ROXICODONE) 5 MG immediate release tablet [id number] Yes Take 1 tablet (5 mg total) by mouth every 8 (eight) hours as needed for moderate pain (pain score 4-6), severe pain (pain score 7-10) or breakthrough pain. Sherryll Burger, Pratik D, DO Taking Active   triamcinolone cream (KENALOG) 0.1 % [id number]  Apply 1 application topically 2 (two) times daily.  Patient taking differently: Apply 1 application  topically 2 (two) times daily as needed (Dermatitis).   Dettinger, Elige Radon, MD  Active Self, Child, Pharmacy Records            Recommendation:   PCP Follow-up Continue to manage diabetes with diet, fall precautions/use walker, follow COPD treatment plan, maintain appetite/take in protein foods & supplements  Follow Up Plan:   Will be followed up by Danise Edge RN CM on 07/11/23 3 pm as patient agreed to after warm transfer discussed  Kirsta Probert L. Noelle Penner, RN, BSN, CCM [place]  [facility], [facility] RN Care Manager Direct Dial: [phone]  Fax: [phone]

## 2023-06-22 ENCOUNTER — Ambulatory Visit

## 2023-06-22 DIAGNOSIS — S32119D Unspecified Zone I fracture of sacrum, subsequent encounter for fracture with routine healing: Secondary | ICD-10-CM

## 2023-06-22 DIAGNOSIS — Z7902 Long term (current) use of antithrombotics/antiplatelets: Secondary | ICD-10-CM | POA: Diagnosis not present

## 2023-06-22 DIAGNOSIS — Z86018 Personal history of other benign neoplasm: Secondary | ICD-10-CM | POA: Diagnosis not present

## 2023-06-22 DIAGNOSIS — Z7983 Long term (current) use of bisphosphonates: Secondary | ICD-10-CM | POA: Diagnosis not present

## 2023-06-22 DIAGNOSIS — E559 Vitamin D deficiency, unspecified: Secondary | ICD-10-CM

## 2023-06-22 DIAGNOSIS — E039 Hypothyroidism, unspecified: Secondary | ICD-10-CM | POA: Diagnosis not present

## 2023-06-22 DIAGNOSIS — E782 Mixed hyperlipidemia: Secondary | ICD-10-CM

## 2023-06-22 DIAGNOSIS — E119 Type 2 diabetes mellitus without complications: Secondary | ICD-10-CM

## 2023-06-22 DIAGNOSIS — M47817 Spondylosis without myelopathy or radiculopathy, lumbosacral region: Secondary | ICD-10-CM | POA: Diagnosis not present

## 2023-06-22 DIAGNOSIS — Z8673 Personal history of transient ischemic attack (TIA), and cerebral infarction without residual deficits: Secondary | ICD-10-CM

## 2023-06-22 DIAGNOSIS — S32592D Other specified fracture of left pubis, subsequent encounter for fracture with routine healing: Secondary | ICD-10-CM | POA: Diagnosis not present

## 2023-06-22 DIAGNOSIS — I10 Essential (primary) hypertension: Secondary | ICD-10-CM

## 2023-06-27 DIAGNOSIS — S32119D Unspecified Zone I fracture of sacrum, subsequent encounter for fracture with routine healing: Secondary | ICD-10-CM | POA: Diagnosis not present

## 2023-06-27 DIAGNOSIS — I10 Essential (primary) hypertension: Secondary | ICD-10-CM | POA: Diagnosis not present

## 2023-06-27 DIAGNOSIS — E119 Type 2 diabetes mellitus without complications: Secondary | ICD-10-CM | POA: Diagnosis not present

## 2023-06-27 DIAGNOSIS — M47817 Spondylosis without myelopathy or radiculopathy, lumbosacral region: Secondary | ICD-10-CM | POA: Diagnosis not present

## 2023-06-27 DIAGNOSIS — S32592D Other specified fracture of left pubis, subsequent encounter for fracture with routine healing: Secondary | ICD-10-CM | POA: Diagnosis not present

## 2023-06-28 DIAGNOSIS — M47817 Spondylosis without myelopathy or radiculopathy, lumbosacral region: Secondary | ICD-10-CM | POA: Diagnosis not present

## 2023-06-28 DIAGNOSIS — S32592D Other specified fracture of left pubis, subsequent encounter for fracture with routine healing: Secondary | ICD-10-CM | POA: Diagnosis not present

## 2023-06-28 DIAGNOSIS — E119 Type 2 diabetes mellitus without complications: Secondary | ICD-10-CM | POA: Diagnosis not present

## 2023-06-28 DIAGNOSIS — I10 Essential (primary) hypertension: Secondary | ICD-10-CM | POA: Diagnosis not present

## 2023-06-28 DIAGNOSIS — S32119D Unspecified Zone I fracture of sacrum, subsequent encounter for fracture with routine healing: Secondary | ICD-10-CM | POA: Diagnosis not present

## 2023-07-03 ENCOUNTER — Inpatient Hospital Stay (HOSPITAL_COMMUNITY)
Admission: EM | Admit: 2023-07-03 | Discharge: 2023-07-10 | DRG: 871 | Disposition: A | Discharge status: Home Health | Attending: Student | Admitting: Student

## 2023-07-03 ENCOUNTER — Encounter (HOSPITAL_COMMUNITY): Payer: Self-pay

## 2023-07-03 ENCOUNTER — Emergency Department (HOSPITAL_COMMUNITY)

## 2023-07-03 DIAGNOSIS — G8929 Other chronic pain: Secondary | ICD-10-CM | POA: Diagnosis present

## 2023-07-03 DIAGNOSIS — J690 Pneumonitis due to inhalation of food and vomit: Secondary | ICD-10-CM | POA: Diagnosis not present

## 2023-07-03 DIAGNOSIS — L89302 Pressure ulcer of unspecified buttock, stage 2: Secondary | ICD-10-CM | POA: Diagnosis not present

## 2023-07-03 DIAGNOSIS — R0603 Acute respiratory distress: Principal | ICD-10-CM

## 2023-07-03 DIAGNOSIS — M479 Spondylosis, unspecified: Secondary | ICD-10-CM | POA: Diagnosis present

## 2023-07-03 DIAGNOSIS — Z8673 Personal history of transient ischemic attack (TIA), and cerebral infarction without residual deficits: Secondary | ICD-10-CM

## 2023-07-03 DIAGNOSIS — J9601 Acute respiratory failure with hypoxia: Secondary | ICD-10-CM | POA: Diagnosis not present

## 2023-07-03 DIAGNOSIS — E43 Unspecified severe protein-calorie malnutrition: Secondary | ICD-10-CM | POA: Diagnosis present

## 2023-07-03 DIAGNOSIS — E875 Hyperkalemia: Secondary | ICD-10-CM | POA: Diagnosis not present

## 2023-07-03 DIAGNOSIS — J440 Chronic obstructive pulmonary disease with acute lower respiratory infection: Secondary | ICD-10-CM | POA: Diagnosis present

## 2023-07-03 DIAGNOSIS — M810 Age-related osteoporosis without current pathological fracture: Secondary | ICD-10-CM | POA: Diagnosis present

## 2023-07-03 DIAGNOSIS — R0602 Shortness of breath: Secondary | ICD-10-CM | POA: Diagnosis not present

## 2023-07-03 DIAGNOSIS — J441 Chronic obstructive pulmonary disease with (acute) exacerbation: Secondary | ICD-10-CM | POA: Diagnosis present

## 2023-07-03 DIAGNOSIS — A419 Sepsis, unspecified organism: Secondary | ICD-10-CM | POA: Diagnosis not present

## 2023-07-03 DIAGNOSIS — E876 Hypokalemia: Secondary | ICD-10-CM | POA: Diagnosis present

## 2023-07-03 DIAGNOSIS — E1165 Type 2 diabetes mellitus with hyperglycemia: Secondary | ICD-10-CM | POA: Diagnosis present

## 2023-07-03 DIAGNOSIS — Z87891 Personal history of nicotine dependence: Secondary | ICD-10-CM

## 2023-07-03 DIAGNOSIS — Z743 Need for continuous supervision: Secondary | ICD-10-CM | POA: Diagnosis not present

## 2023-07-03 DIAGNOSIS — L89152 Pressure ulcer of sacral region, stage 2: Secondary | ICD-10-CM | POA: Diagnosis present

## 2023-07-03 DIAGNOSIS — E785 Hyperlipidemia, unspecified: Secondary | ICD-10-CM | POA: Diagnosis not present

## 2023-07-03 DIAGNOSIS — Z7983 Long term (current) use of bisphosphonates: Secondary | ICD-10-CM

## 2023-07-03 DIAGNOSIS — Z823 Family history of stroke: Secondary | ICD-10-CM

## 2023-07-03 DIAGNOSIS — E871 Hypo-osmolality and hyponatremia: Secondary | ICD-10-CM | POA: Diagnosis not present

## 2023-07-03 DIAGNOSIS — E1142 Type 2 diabetes mellitus with diabetic polyneuropathy: Secondary | ICD-10-CM | POA: Diagnosis not present

## 2023-07-03 DIAGNOSIS — E039 Hypothyroidism, unspecified: Secondary | ICD-10-CM | POA: Diagnosis present

## 2023-07-03 DIAGNOSIS — J439 Emphysema, unspecified: Secondary | ICD-10-CM | POA: Diagnosis not present

## 2023-07-03 DIAGNOSIS — Z9049 Acquired absence of other specified parts of digestive tract: Secondary | ICD-10-CM

## 2023-07-03 DIAGNOSIS — T380X5A Adverse effect of glucocorticoids and synthetic analogues, initial encounter: Secondary | ICD-10-CM | POA: Diagnosis not present

## 2023-07-03 DIAGNOSIS — Z79899 Other long term (current) drug therapy: Secondary | ICD-10-CM

## 2023-07-03 DIAGNOSIS — R079 Chest pain, unspecified: Secondary | ICD-10-CM | POA: Diagnosis not present

## 2023-07-03 DIAGNOSIS — I5041 Acute combined systolic (congestive) and diastolic (congestive) heart failure: Secondary | ICD-10-CM | POA: Diagnosis not present

## 2023-07-03 DIAGNOSIS — Z6824 Body mass index (BMI) 24.0-24.9, adult: Secondary | ICD-10-CM

## 2023-07-03 DIAGNOSIS — Z1152 Encounter for screening for COVID-19: Secondary | ICD-10-CM | POA: Diagnosis not present

## 2023-07-03 DIAGNOSIS — I5181 Takotsubo syndrome: Secondary | ICD-10-CM | POA: Diagnosis not present

## 2023-07-03 DIAGNOSIS — I10 Essential (primary) hypertension: Secondary | ICD-10-CM | POA: Diagnosis present

## 2023-07-03 DIAGNOSIS — E119 Type 2 diabetes mellitus without complications: Secondary | ICD-10-CM

## 2023-07-03 DIAGNOSIS — R069 Unspecified abnormalities of breathing: Secondary | ICD-10-CM | POA: Diagnosis not present

## 2023-07-03 DIAGNOSIS — I499 Cardiac arrhythmia, unspecified: Secondary | ICD-10-CM | POA: Diagnosis not present

## 2023-07-03 DIAGNOSIS — R6889 Other general symptoms and signs: Secondary | ICD-10-CM | POA: Diagnosis not present

## 2023-07-03 DIAGNOSIS — F39 Unspecified mood [affective] disorder: Secondary | ICD-10-CM | POA: Diagnosis present

## 2023-07-03 DIAGNOSIS — J4520 Mild intermittent asthma, uncomplicated: Secondary | ICD-10-CM

## 2023-07-03 DIAGNOSIS — Z886 Allergy status to analgesic agent status: Secondary | ICD-10-CM

## 2023-07-03 DIAGNOSIS — Z79890 Hormone replacement therapy (postmenopausal): Secondary | ICD-10-CM | POA: Diagnosis not present

## 2023-07-03 DIAGNOSIS — J189 Pneumonia, unspecified organism: Secondary | ICD-10-CM | POA: Diagnosis present

## 2023-07-03 DIAGNOSIS — I700 Atherosclerosis of aorta: Secondary | ICD-10-CM | POA: Diagnosis not present

## 2023-07-03 DIAGNOSIS — I5043 Acute on chronic combined systolic (congestive) and diastolic (congestive) heart failure: Secondary | ICD-10-CM | POA: Diagnosis present

## 2023-07-03 DIAGNOSIS — R0902 Hypoxemia: Secondary | ICD-10-CM | POA: Diagnosis not present

## 2023-07-03 DIAGNOSIS — I2510 Atherosclerotic heart disease of native coronary artery without angina pectoris: Secondary | ICD-10-CM | POA: Diagnosis not present

## 2023-07-03 DIAGNOSIS — R009 Unspecified abnormalities of heart beat: Secondary | ICD-10-CM | POA: Diagnosis not present

## 2023-07-03 DIAGNOSIS — J9602 Acute respiratory failure with hypercapnia: Secondary | ICD-10-CM | POA: Diagnosis present

## 2023-07-03 DIAGNOSIS — Z7902 Long term (current) use of antithrombotics/antiplatelets: Secondary | ICD-10-CM

## 2023-07-03 DIAGNOSIS — Z833 Family history of diabetes mellitus: Secondary | ICD-10-CM

## 2023-07-03 DIAGNOSIS — R918 Other nonspecific abnormal finding of lung field: Secondary | ICD-10-CM | POA: Diagnosis not present

## 2023-07-03 DIAGNOSIS — Z825 Family history of asthma and other chronic lower respiratory diseases: Secondary | ICD-10-CM

## 2023-07-03 DIAGNOSIS — R0689 Other abnormalities of breathing: Secondary | ICD-10-CM | POA: Diagnosis not present

## 2023-07-03 DIAGNOSIS — L8990 Pressure ulcer of unspecified site, unspecified stage: Secondary | ICD-10-CM | POA: Insufficient documentation

## 2023-07-03 LAB — CBC WITH DIFFERENTIAL/PLATELET
Abs Immature Granulocytes: 0.15 10*3/uL — ABNORMAL HIGH (ref 0.00–0.07)
Basophils Absolute: 0 10*3/uL (ref 0.0–0.1)
Basophils Relative: 0 %
Eosinophils Absolute: 0.2 10*3/uL (ref 0.0–0.5)
Eosinophils Relative: 1 %
HCT: 42.8 % (ref 36.0–46.0)
Hemoglobin: 13.4 g/dL (ref 12.0–15.0)
Immature Granulocytes: 1 %
Lymphocytes Relative: 15 %
Lymphs Abs: 3 10*3/uL (ref 0.7–4.0)
MCH: 27.2 pg (ref 26.0–34.0)
MCHC: 31.3 g/dL (ref 30.0–36.0)
MCV: 86.8 fL (ref 80.0–100.0)
Monocytes Absolute: 1.2 10*3/uL — ABNORMAL HIGH (ref 0.1–1.0)
Monocytes Relative: 6 %
Neutro Abs: 16.1 10*3/uL — ABNORMAL HIGH (ref 1.7–7.7)
Neutrophils Relative %: 77 %
Platelets: 448 10*3/uL — ABNORMAL HIGH (ref 150–400)
RBC: 4.93 MIL/uL (ref 3.87–5.11)
RDW: 13 % (ref 11.5–15.5)
WBC: 20.7 10*3/uL — ABNORMAL HIGH (ref 4.0–10.5)
nRBC: 0 % (ref 0.0–0.2)

## 2023-07-03 LAB — I-STAT ARTERIAL BLOOD GAS, ED
Acid-Base Excess: 0 mmol/L (ref 0.0–2.0)
Bicarbonate: 27.5 mmol/L (ref 20.0–28.0)
Calcium, Ion: 1.26 mmol/L (ref 1.15–1.40)
HCT: 40 % (ref 36.0–46.0)
Hemoglobin: 13.6 g/dL (ref 12.0–15.0)
O2 Saturation: 98 %
Patient temperature: 98.5
Potassium: 2.6 mmol/L — CL (ref 3.5–5.1)
Sodium: 138 mmol/L (ref 135–145)
TCO2: 29 mmol/L (ref 22–32)
pCO2 arterial: 53.4 mmHg — ABNORMAL HIGH (ref 32–48)
pH, Arterial: 7.32 — ABNORMAL LOW (ref 7.35–7.45)
pO2, Arterial: 119 mmHg — ABNORMAL HIGH (ref 83–108)

## 2023-07-03 LAB — COMPREHENSIVE METABOLIC PANEL WITH GFR
ALT: 9 U/L (ref 0–44)
AST: 24 U/L (ref 15–41)
Albumin: 3.9 g/dL (ref 3.5–5.0)
Alkaline Phosphatase: 80 U/L (ref 38–126)
Anion gap: 17 — ABNORMAL HIGH (ref 5–15)
BUN: 5 mg/dL — ABNORMAL LOW (ref 8–23)
CO2: 22 mmol/L (ref 22–32)
Calcium: 9.6 mg/dL (ref 8.9–10.3)
Chloride: 95 mmol/L — ABNORMAL LOW (ref 98–111)
Creatinine, Ser: 0.86 mg/dL (ref 0.44–1.00)
GFR, Estimated: >60 mL/min (ref >60)
Glucose, Bld: 354 mg/dL — ABNORMAL HIGH (ref 70–99)
Potassium: 2.8 mmol/L — ABNORMAL LOW (ref 3.5–5.1)
Sodium: 134 mmol/L — ABNORMAL LOW (ref 135–145)
Total Bilirubin: 0.6 mg/dL (ref 0.0–1.2)
Total Protein: 6.8 g/dL (ref 6.5–8.1)

## 2023-07-03 LAB — RESPIRATORY PANEL BY PCR

## 2023-07-03 LAB — HEMOGLOBIN A1C
Hgb A1c MFr Bld: 5.3 % (ref 4.8–5.6)
Mean Plasma Glucose: 105.41 mg/dL

## 2023-07-03 LAB — RESP PANEL BY RT-PCR (RSV, FLU A&B, COVID)  RVPGX2
Influenza A by PCR: NEGATIVE
Influenza B by PCR: NEGATIVE
Resp Syncytial Virus by PCR: NEGATIVE
SARS Coronavirus 2 by RT PCR: NEGATIVE

## 2023-07-03 LAB — CBG MONITORING, ED
Glucose-Capillary: 134 mg/dL — ABNORMAL HIGH (ref 70–99)
Glucose-Capillary: 165 mg/dL — ABNORMAL HIGH (ref 70–99)

## 2023-07-03 LAB — MAGNESIUM: Magnesium: 2.2 mg/dL (ref 1.7–2.4)

## 2023-07-03 LAB — BRAIN NATRIURETIC PEPTIDE: B Natriuretic Peptide: 18.6 pg/mL (ref 0.0–100.0)

## 2023-07-03 LAB — HIV ANTIBODY (ROUTINE TESTING W REFLEX): HIV Screen 4th Generation wRfx: NONREACTIVE

## 2023-07-03 MED ORDER — ALBUTEROL SULFATE (2.5 MG/3ML) 0.083% IN NEBU
5.0000 mg | INHALATION_SOLUTION | Freq: Once | RESPIRATORY_TRACT | Status: AC
  Administered 2023-07-03: 5 mg via RESPIRATORY_TRACT
  Filled 2023-07-03: qty 6

## 2023-07-03 MED ORDER — POTASSIUM CHLORIDE CRYS ER 20 MEQ PO TBCR
40.0000 meq | EXTENDED_RELEASE_TABLET | ORAL | Status: AC
  Administered 2023-07-03 (×2): 40 meq via ORAL
  Filled 2023-07-03 (×2): qty 2

## 2023-07-03 MED ORDER — ARFORMOTEROL TARTRATE 15 MCG/2ML IN NEBU
15.0000 ug | INHALATION_SOLUTION | Freq: Two times a day (BID) | RESPIRATORY_TRACT | Status: DC
Start: 2023-07-03 — End: 2023-07-04

## 2023-07-03 MED ORDER — POTASSIUM CHLORIDE IN NACL 20-0.9 MEQ/L-% IV SOLN
Freq: Once | INTRAVENOUS | Status: AC
  Filled 2023-07-03: qty 1000

## 2023-07-03 MED ORDER — IPRATROPIUM-ALBUTEROL 0.5-2.5 (3) MG/3ML IN SOLN: 3.0000 mL | RESPIRATORY_TRACT | Status: DC | PRN

## 2023-07-03 MED ORDER — ONDANSETRON HCL 4 MG/2ML IJ SOLN: 4.0000 mg | Freq: Four times a day (QID) | INTRAMUSCULAR | Status: DC | PRN

## 2023-07-03 MED ORDER — METHYLPREDNISOLONE SODIUM SUCC 125 MG IJ SOLR
80.0000 mg | Freq: Every day | INTRAMUSCULAR | Status: DC
  Administered 2023-07-04 – 2023-07-07 (×4): 80 mg via INTRAVENOUS
  Filled 2023-07-03 (×4): qty 2

## 2023-07-03 MED ORDER — SODIUM CHLORIDE 0.9 % IV SOLN
1.0000 g | Freq: Once | INTRAVENOUS | Status: AC
  Administered 2023-07-03: 1 g via INTRAVENOUS
  Filled 2023-07-03: qty 10

## 2023-07-03 MED ORDER — POTASSIUM CHLORIDE IN NACL 20-0.9 MEQ/L-% IV SOLN
INTRAVENOUS | Status: DC
  Filled 2023-07-03 (×2): qty 1000

## 2023-07-03 MED ORDER — REVEFENACIN 175 MCG/3ML IN SOLN: 175.0000 ug | Freq: Every day | RESPIRATORY_TRACT | Status: DC

## 2023-07-03 MED ORDER — INSULIN ASPART 100 UNIT/ML IJ SOLN
0.0000 [IU] | Freq: Three times a day (TID) | INTRAMUSCULAR | Status: DC
  Administered 2023-07-04: 3 [IU] via SUBCUTANEOUS
  Administered 2023-07-04 (×2): 2 [IU] via SUBCUTANEOUS
  Administered 2023-07-05: 5 [IU] via SUBCUTANEOUS
  Administered 2023-07-05: 3 [IU] via SUBCUTANEOUS
  Administered 2023-07-06: 5 [IU] via SUBCUTANEOUS
  Administered 2023-07-07: 2 [IU] via SUBCUTANEOUS
  Administered 2023-07-07: 3 [IU] via SUBCUTANEOUS
  Administered 2023-07-08: 5 [IU] via SUBCUTANEOUS
  Administered 2023-07-08 – 2023-07-09 (×2): 3 [IU] via SUBCUTANEOUS

## 2023-07-03 MED ORDER — DEXTROSE 5 % IV SOLN
250.0000 mg | INTRAVENOUS | Status: DC
  Administered 2023-07-04 – 2023-07-06 (×3): 250 mg via INTRAVENOUS
  Filled 2023-07-03 (×4): qty 2.5

## 2023-07-03 MED ORDER — BUDESONIDE 0.5 MG/2ML IN SUSP
0.5000 mg | Freq: Two times a day (BID) | RESPIRATORY_TRACT | Status: DC
  Administered 2023-07-04 – 2023-07-09 (×11): 0.5 mg via RESPIRATORY_TRACT
  Filled 2023-07-03 (×11): qty 2

## 2023-07-03 MED ORDER — ONDANSETRON HCL 4 MG PO TABS: 4.0000 mg | ORAL_TABLET | Freq: Four times a day (QID) | ORAL | Status: DC | PRN

## 2023-07-03 MED ORDER — SODIUM CHLORIDE 0.9 % IV SOLN
500.0000 mg | Freq: Once | INTRAVENOUS | Status: AC
  Administered 2023-07-03: 500 mg via INTRAVENOUS
  Filled 2023-07-03: qty 5

## 2023-07-03 MED ORDER — ACETAMINOPHEN 650 MG RE SUPP: 650.0000 mg | Freq: Four times a day (QID) | RECTAL | Status: DC | PRN

## 2023-07-03 MED ORDER — ACETAMINOPHEN 325 MG PO TABS: 650.0000 mg | ORAL_TABLET | Freq: Four times a day (QID) | ORAL | Status: DC | PRN

## 2023-07-03 MED ORDER — ENOXAPARIN SODIUM 40 MG/0.4ML IJ SOSY
40.0000 mg | PREFILLED_SYRINGE | INTRAMUSCULAR | Status: DC
  Administered 2023-07-03 – 2023-07-09 (×7): 40 mg via SUBCUTANEOUS
  Filled 2023-07-03 (×7): qty 0.4

## 2023-07-03 MED ORDER — INSULIN ASPART 100 UNIT/ML IJ SOLN
0.0000 [IU] | Freq: Every day | INTRAMUSCULAR | Status: DC
Start: 2023-07-03 — End: 2023-07-10

## 2023-07-03 NOTE — ED Provider Notes (Addendum)
 [place] EMERGENCY DEPARTMENT AT [facility] Provider Note   CSN: [id number] Arrival date & time: 07/03/23  1303     History  Chief Complaint  Patient presents with   Respiratory Distress    Laura Dalton is a 73 y.o. female.  HPI Patient presents in respiratory distress.  History is provided by EMS that the patient can only nod for questioning.  Per EMS report the patient's illness began about 5 days ago, with gradual weakness that was progressive.  Shortness of breath was also progressive and today she was found to be hypoxic, 70% on room air.  Patient denies history of COPD or CHF.  EMS provided steroids, bronchodilators and CPAP in transport.    Home Medications Prior to Admission medications   Medication Sig Start Date End Date Taking? Authorizing Provider  albuterol  (VENTOLIN  HFA) 108 (90 Base) MCG/ACT inhaler 2 puffs every 6 hours as needed for wheezing or shortness of breath 10/23/22   Dettinger, Lucio Sabin, MD  alendronate  (FOSAMAX ) 70 MG tablet TAKE 1 TABLET BY MOUTH WEEKLY  WITH 8 OZ OF PLAIN WATER 30  MINUTES BEFORE FIRST FOOD, DRINK OR MEDS. STAY UPRIGHT FOR 30  MINS 01/29/23   Dettinger, Lucio Sabin, MD  amitriptyline  (ELAVIL ) 50 MG tablet TAKE 1 TABLET BY MOUTH AT  BEDTIME 01/29/23   Dettinger, Lucio Sabin, MD  atorvastatin  (LIPITOR) 40 MG tablet TAKE 1 TABLET BY MOUTH DAILY 01/29/23   Dettinger, Lucio Sabin, MD  calcium  carbonate (OSCAL) 1500 (600 Ca) MG TABS tablet Take 1 tablet (1,500 mg total) by mouth 2 (two) times daily with a meal. 06/02/23   Mason Sole, Pratik D, DO  clopidogrel  (PLAVIX ) 75 MG tablet TAKE 1 TABLET BY MOUTH DAILY 01/29/23   Dettinger, Lucio Sabin, MD  donepezil  (ARICEPT ) 10 MG tablet Take 1 tablet (10 mg total) by mouth at bedtime. 01/18/23   Dettinger, Lucio Sabin, MD  DULoxetine  (CYMBALTA ) 60 MG capsule TAKE 1 CAPSULE BY MOUTH DAILY Patient taking differently: Take 60 mg by mouth at bedtime. 01/29/23   Dettinger, Lucio Sabin, MD  ibuprofen  (ADVIL ) 200 MG  tablet Take 400 mg by mouth every 6 (six) hours as needed for moderate pain (pain score 4-6).    [provider]  levothyroxine  (SYNTHROID ) 125 MCG tablet TAKE 1 TABLET BY MOUTH DAILY  BEFORE BREAKFAST 01/29/23   Dettinger, Lucio Sabin, MD  lisinopril  (ZESTRIL ) 40 MG tablet Take 1 tablet (40 mg total) by mouth daily. 10/23/22   Dettinger, Lucio Sabin, MD  methocarbamol  (ROBAXIN ) 500 MG tablet Take 1 tablet (500 mg total) by mouth every 8 (eight) hours as needed for muscle spasms. 06/02/23   Mason Sole, Pratik D, DO  oxyCODONE  (ROXICODONE ) 5 MG immediate release tablet Take 1 tablet (5 mg total) by mouth every 8 (eight) hours as needed for moderate pain (pain score 4-6), severe pain (pain score 7-10) or breakthrough pain. 06/02/23 06/01/24  Mason Sole, Pratik D, DO  triamcinolone  cream (KENALOG ) 0.1 % Apply 1 application topically 2 (two) times daily. Patient taking differently: Apply 1 application  topically 2 (two) times daily as needed (Dermatitis). 08/01/18   Dettinger, Lucio Sabin, MD      Allergies    Aspirin and Gabapentin     Review of Systems   Review of Systems  Physical Exam Updated Vital Signs BP (!) 100/57   Pulse 99   Temp 98.5 F (36.9 C) (Rectal)   Resp (!) 24   Ht 5\' 6"  (1.676 m)   Wt 68 kg  SpO2 94%   BMI 24.20 kg/m  Physical Exam Vitals and nursing note reviewed.  Constitutional:      General: She is not in acute distress.    Appearance: She is well-developed.  HENT:     Head: Normocephalic and atraumatic.  Eyes:     Conjunctiva/sclera: Conjunctivae normal.  Cardiovascular:     Rate and Rhythm: Regular rhythm. Tachycardia present.  Pulmonary:     Effort: Respiratory distress present.     Breath sounds: No stridor. Rhonchi present.  Abdominal:     General: There is no distension.  Skin:    General: Skin is warm and dry.  Neurological:     Mental Status: She is alert and oriented to person, place, and time.     Cranial Nerves: No cranial nerve deficit.  Psychiatric:         Mood and Affect: Mood normal.     ED Results / Procedures / Treatments   Labs (all labs ordered are listed, but only abnormal results are displayed) Labs Reviewed  COMPREHENSIVE METABOLIC PANEL WITH GFR - Abnormal; Notable for the following components:      Result Value   Sodium 134 (*)    Potassium 2.8 (*)    Chloride 95 (*)    Glucose, Bld 354 (*)    BUN 5 (*)    Anion gap 17 (*)    All other components within normal limits  CBC WITH DIFFERENTIAL/PLATELET - Abnormal; Notable for the following components:   WBC 20.7 (*)    Platelets 448 (*)    Neutro Abs 16.1 (*)    Monocytes Absolute 1.2 (*)    Abs Immature Granulocytes 0.15 (*)    All other components within normal limits  RESP PANEL BY RT-PCR (RSV, FLU A&B, COVID)  RVPGX2  BRAIN NATRIURETIC PEPTIDE    EKG EKG Interpretation Date/Time:  Tuesday July 03 2023 13:10:08 EDT Ventricular Rate:  106 PR Interval:  44 QRS Duration:  124 QT Interval:  483 QTC Calculation: 642 R Axis:   -80  Text Interpretation: Sinus or ectopic atrial tachycardia Nonspecific IVCD with LAD Left ventricular hypertrophy Anterior infarct, old ST-t wave abnormality Confirmed by Dorenda Gandy [phone]) on 07/03/2023 1:14:06 PM  Radiology DG Chest Port 1 View Result Date: 07/03/2023 CLINICAL DATA:  Shortness of breath EXAM: PORTABLE CHEST 1 VIEW COMPARISON:  December 13, 2022 FINDINGS: The heart size and mediastinal contours are within normal limits. Both lungs are clear. The visualized skeletal structures are unremarkable. IMPRESSION: No active disease. Electronically Signed   By: Fredrich Jefferson M.D.   On: 07/03/2023 14:27    Procedures Procedures    Medications Ordered in ED Medications  azithromycin  (ZITHROMAX ) 500 mg in sodium chloride  0.9 % 250 mL IVPB (500 mg Intravenous New Bag/Given 07/03/23 1405)  0.9 % NaCl with KCl 20 mEq/ L  infusion (has no administration in time range)  albuterol  (PROVENTIL ) (2.5 MG/3ML) 0.083% nebulizer solution  5 mg (5 mg Nebulization Given by Other 07/03/23 1322)  cefTRIAXone  (ROCEPHIN ) 1 g in sodium chloride  0.9 % 100 mL IVPB (0 g Intravenous Stopped 07/03/23 1455)    ED Course/ Medical Decision Making/ A&P                                 Medical Decision Making Adult female without history of heart failure, or COPD now presents in respiratory distress. Differential includes new COPD, CHF, infection, cardiomyopathy. After  arrival patient started on BiPAP with assistance of a respiratory therapists, placed on continuous cardiac monitoring, pulse oximetry. Cardiac 80 sinus normal Pulse oximetry variable, BiPAP, abnormal   Amount and/or Complexity of Data Reviewed Independent Historian: EMS Labs: ordered. Decision-making details documented in ED Course. Radiology: ordered and independent interpretation performed. Decision-making details documented in ED Course. ECG/medicine tests: ordered and independent interpretation performed. Decision-making details documented in ED Course.  Risk Prescription drug management. Decision regarding hospitalization. Diagnosis or treatment significantly limited by social determinants of health.   3:01 PM Patient substantially more awake, now companied by her son.  We reviewed the findings thus far, notable for leukocytosis, respiratory compromise requiring antibiotics, BiPAP, concern for pneumonia clinically, though her x-ray interpretation is not consistent with this.  Viral panel pending.  Patient is now much more awake, alert, interactive, transitioned from BiPAP now on 3 L via nasal cannula, and given these findings patient will require admission for further monitoring, management.  3:32 PM Patient did not tolerate nasal cannula, is back on BiPAP, but remains with appropriate mentation.  Final Clinical Impression(s) / ED Diagnoses Final diagnoses:  Respiratory distress    CRITICAL CARE Performed by: Dorenda Gandy Total critical care time: 35  minutes Critical care time was exclusive of separately billable procedures and treating other patients. Critical care was necessary to treat or prevent imminent or life-threatening deterioration. Critical care was time spent personally by me on the following activities: development of treatment plan with patient and/or surrogate as well as nursing, discussions with consultants, evaluation of patient's response to treatment, examination of patient, obtaining history from patient or surrogate, ordering and performing treatments and interventions, ordering and review of laboratory studies, ordering and review of radiographic studies, pulse oximetry and re-evaluation of patient's condition.    Dorenda Gandy, MD 07/03/23 1502    Dorenda Gandy, MD 07/03/23 [phone]

## 2023-07-03 NOTE — Progress Notes (Signed)
 Pt removed from bipap for trial but did not tolerate being off for more than a few minutes.  I tried a Information systems manager at 15L but sat stayed in mid-eighties.  I replaced the bipap at this time d/t increased O2 needs.  Pt now on 8+ and 100%.  Rt will continue to monitor.

## 2023-07-03 NOTE — H&P (Signed)
 History and Physical    Patient: Laura Dalton ZOX:[id number] DOB: February 17, 1951 DOA: 07/03/2023 DOS: the patient was seen and examined on 07/03/2023 PCP: Dettinger, Lucio Sabin, MD  Patient coming from: Home.  Lives alone.  Uses rolling walker at baseline.  Chief Complaint:  Chief Complaint  Patient presents with   Respiratory Distress   HPI: Laura Dalton is a 73 y.o. female with PMH of COPD, DM-2, with neuropathy, HTN, chronic back pain, lumbar laminectomy, thoracic compression fraction, hypothyroidism, memory loss and osteoporosis presenting with progressive shortness of breath.  History provided by patient and patient's son at bedside.  Patient reports viral respiratory infection about 2 weeks ago.  She had chest congestion, cough and shortness of breath.  She did not seek medical care.  She has progressive symptoms and felt very short of breath, weak and diaphoretic when she tried to get out of a house today.  She denies chest pain, fever, chills and palpitation.  She reports runny nose but denies sore throat.  She had 1 episode of emesis today.  Emesis was nonbloody and nonbilious.  She denies nausea or abdominal pain.  Denies UTI or focal neurosymptoms.  She denies formal diagnosis of COPD with albuterol  for "wheezing and shortness of breath".  She is not on oxygen.  She denies orthopnea, PND, leg swelling.  Quit smoking long time ago.  Denies drinking alcohol  or recreational drug use.  She is interested in cardiopulmonary cessation in an event of sudden cardiopulmonary arrest.  Per EMS, patient was wheezy and diaphoretic with saturation to 70% on room air.  She was started on CPAP, given Solu-Medrol  125 mg and magnesium  2 g.  In ED, started on BiPAP.  Desaturated to 86% when weaned to 6 L by [place].  Tachypneic to 28.  Slightly tachycardic to 100.  Na 124.  K2.8.  Glucose 354.  WBC 20.4 with left shift.  COVID-19, influenza and RSV PCR nonreactive.  CXR without acute finding.  BNP 18.6.  EKG  sinus rhythm with no acute ischemic finding but poor quality.  Received ceftriaxone , Zithromax , 500 cc NaCl with KCl.  Admission requested for acute hypoxic respiratory failure due to COPD exacerbation.  Review of Systems: As mentioned in the history of present illness. All other systems reviewed and are negative. Past Medical History:  Diagnosis Date   Bursitis of hip    Diabetes mellitus without complication (HCC)    type II   Hyperlipidemia    Hypertension    Hypothyroidism    Low back pain    benign turmor back   Stroke [facility])    Vitamin D  deficiency disease    Past Surgical History:  Procedure Laterality Date   APPENDECTOMY     BACK SURGERY  1996   turmor on back    CHOLECYSTECTOMY     LEG SURGERY Right    "clot and had drain in leg  rt lower leg   Social History:  reports that she quit smoking about 15 years ago. Her smoking use included cigarettes. She started smoking about 60 years ago. She has a 22.6 pack-year smoking history. She has never used smokeless tobacco. She reports that she does not drink alcohol  and does not use drugs.  Allergies  Allergen Reactions   Aspirin Hives   Gabapentin  Other (See Comments)    Dizzy,falls    Family History  Problem Relation Age of Onset   Diabetes Mother    Stroke Mother    Seizures Mother  Cancer Father        prostate   COPD Sister    COPD Sister    COPD Sister    Cancer Brother        lung   Cancer Brother        prostate   COPD Brother    COPD Brother    Thyroid  disease Son    Hashimoto's thyroiditis Son    Breast cancer Neg Hx     Prior to Admission medications   Medication Sig Start Date End Date Taking? Authorizing Provider  albuterol  (VENTOLIN  HFA) 108 (90 Base) MCG/ACT inhaler 2 puffs every 6 hours as needed for wheezing or shortness of breath 10/23/22   Dettinger, Lucio Sabin, MD  alendronate  (FOSAMAX ) 70 MG tablet TAKE 1 TABLET BY MOUTH WEEKLY  WITH 8 OZ OF PLAIN WATER 30  MINUTES BEFORE FIRST FOOD, DRINK  OR MEDS. STAY UPRIGHT FOR 30  MINS 01/29/23   Dettinger, Lucio Sabin, MD  amitriptyline  (ELAVIL ) 50 MG tablet TAKE 1 TABLET BY MOUTH AT  BEDTIME 01/29/23   Dettinger, Lucio Sabin, MD  atorvastatin  (LIPITOR) 40 MG tablet TAKE 1 TABLET BY MOUTH DAILY 01/29/23   Dettinger, Lucio Sabin, MD  calcium  carbonate (OSCAL) 1500 (600 Ca) MG TABS tablet Take 1 tablet (1,500 mg total) by mouth 2 (two) times daily with a meal. 06/02/23   Mason Sole, Pratik D, DO  clopidogrel  (PLAVIX ) 75 MG tablet TAKE 1 TABLET BY MOUTH DAILY 01/29/23   Dettinger, Lucio Sabin, MD  donepezil  (ARICEPT ) 10 MG tablet Take 1 tablet (10 mg total) by mouth at bedtime. 01/18/23   Dettinger, Lucio Sabin, MD  DULoxetine  (CYMBALTA ) 60 MG capsule TAKE 1 CAPSULE BY MOUTH DAILY Patient taking differently: Take 60 mg by mouth at bedtime. 01/29/23   Dettinger, Lucio Sabin, MD  ibuprofen  (ADVIL ) 200 MG tablet Take 400 mg by mouth every 6 (six) hours as needed for moderate pain (pain score 4-6).    [provider]  levothyroxine  (SYNTHROID ) 125 MCG tablet TAKE 1 TABLET BY MOUTH DAILY  BEFORE BREAKFAST 01/29/23   Dettinger, Lucio Sabin, MD  lisinopril  (ZESTRIL ) 40 MG tablet Take 1 tablet (40 mg total) by mouth daily. 10/23/22   Dettinger, Lucio Sabin, MD  methocarbamol  (ROBAXIN ) 500 MG tablet Take 1 tablet (500 mg total) by mouth every 8 (eight) hours as needed for muscle spasms. 06/02/23   Mason Sole, Pratik D, DO  oxyCODONE  (ROXICODONE ) 5 MG immediate release tablet Take 1 tablet (5 mg total) by mouth every 8 (eight) hours as needed for moderate pain (pain score 4-6), severe pain (pain score 7-10) or breakthrough pain. 06/02/23 06/01/24  Mason Sole, Pratik D, DO  triamcinolone  cream (KENALOG ) 0.1 % Apply 1 application topically 2 (two) times daily. Patient taking differently: Apply 1 application  topically 2 (two) times daily as needed (Dermatitis). 08/01/18   Dettinger, Lucio Sabin, MD    Physical Exam: Vitals:   07/03/23 1502 07/03/23 1515 07/03/23 1518 07/03/23 1530  BP: 100/64 101/60   (!) 106/59  Pulse: 100 95  92  Resp: (!) 22 (!) 22  (!) 22  Temp:      TempSrc:      SpO2: (!) 86% (!) 86% 90% 100%  Weight:      Height:       GENERAL: No apparent distress.  Nontoxic. HEENT: MMM.  Vision and hearing grossly intact.  NECK: Supple.  No apparent JVD.  RESP: On BiPAP.  Rhonchorous. CVS:  RRR. Heart sounds normal.  ABD/GI/GU:  BS+. Abd soft, NTND.  MSK/EXT:   No apparent deformity. Moves extremities. No edema.  No calf tenderness. SKIN: no apparent skin lesion or wound NEURO: Awake and alert. Oriented appropriately.  No apparent focal neuro deficit. PSYCH: Calm. Normal affect.  Data Reviewed: See HPI Assessment and Plan: Principal Problem:   Acute respiratory failure with hypoxia and hypercapnia (HCC) Active Problems:   COPD with acute exacerbation (HCC)   Essential hypertension   DM (diabetes mellitus) (HCC)   Diabetic peripheral neuropathy associated with type 2 diabetes mellitus (HCC)   Acquired hypothyroidism   Degeneration of spine   Hyponatremia    Acute respiratory failure with hypoxia and hypercapnia: Likely due to COPD exacerbation.  Patient with progressive respiratory symptoms for about 2 weeks.  Reportedly saturating at 70% on RA when EMS arrived.  Desaturated to 86% when weaned off BiPAP to 6 L.  ABG 7.3/53/27.  She has URI symptoms.  COVID-19, influenza and RSV PCR nonreactive.  BNP within normal.  Appears euvolemic.  Low suspicion for PE.  CXR without infiltrate.  Received Solu-Medrol  and IV magnesium  from EMS.  Received CTX and Zithromax  in ED. - Continue IV Solu-Medrol  and Zithromax  starting tomorrow - Brovana , Pulmicort , Yupelri  and as needed DuoNebs - Continue BiPAP.  Wean as able - IV NS with KCl at 100 cc an hour - Check full RVP - Needs controller on discharge. - N.p.o. except sips with meds while on BiPAP  COPD exacerbation:  - Management as above  NIDDM-2 with hyperglycemia: Does not seem to be medication.  A1c 5.9% in 10/2022.   Hyperglycemia likely due to steroid  - SSI-moderate while on steroid - CBG monitoring - Hemoglobin A1c  Chronic back pain: Reports using ibuprofen  at home.  Denies using oxycodone  or Robaxin . - Tylenol  as needed  Mood disorder: Stable - Continue home meds after med rec  Hypothyroidism - Continue home Synthroid  after med rec  Essential hypertension - Continue home meds as appropriate  Hyponatremia: Mild - Monitor  Hypokalemia: K2.8 - IV KCl 20 mill equivalent ordered in ED - P.o. KCl 40 mill equivalent QC 3 hours x 2 - Check magnesium      Advance Care Planning:   Code Status: Full Code discussed with patient and patient's son at bedside  Consults: None  Family Communication: Updated patient's son at bedside  Severity of Illness: The appropriate patient status for this patient is INPATIENT. Inpatient status is judged to be reasonable and necessary in order to provide the required intensity of service to ensure the patient's safety. The patient's presenting symptoms, physical exam findings, and initial radiographic and laboratory data in the context of their chronic comorbidities is felt to place them at high risk for further clinical deterioration. Furthermore, it is not anticipated that the patient will be medically stable for discharge from the hospital within 2 midnights of admission.   * I certify that at the point of admission it is my clinical judgment that the patient will require inpatient hospital care spanning beyond 2 midnights from the point of admission due to high intensity of service, high risk for further deterioration and high frequency of surveillance required.*  Author: Theadore Finger, MD 07/03/2023 3:52 PM  For on call review [email or web address].

## 2023-07-03 NOTE — ED Triage Notes (Signed)
 Pt BIB EMS from home for URI x 1 wk. EMS reports pt was found with wheezes/diaphoretic, satting 70% RA. EMS placed on cpap and satting 80%. EMS gave 125 mg solumedrol, 2g Magnesium 

## 2023-07-04 ENCOUNTER — Inpatient Hospital Stay (HOSPITAL_COMMUNITY)

## 2023-07-04 ENCOUNTER — Other Ambulatory Visit: Payer: Self-pay

## 2023-07-04 DIAGNOSIS — E43 Unspecified severe protein-calorie malnutrition: Secondary | ICD-10-CM | POA: Insufficient documentation

## 2023-07-04 DIAGNOSIS — J441 Chronic obstructive pulmonary disease with (acute) exacerbation: Secondary | ICD-10-CM | POA: Diagnosis not present

## 2023-07-04 DIAGNOSIS — E1142 Type 2 diabetes mellitus with diabetic polyneuropathy: Secondary | ICD-10-CM | POA: Diagnosis not present

## 2023-07-04 DIAGNOSIS — J9601 Acute respiratory failure with hypoxia: Secondary | ICD-10-CM | POA: Diagnosis not present

## 2023-07-04 DIAGNOSIS — E039 Hypothyroidism, unspecified: Secondary | ICD-10-CM | POA: Diagnosis not present

## 2023-07-04 LAB — BASIC METABOLIC PANEL WITH GFR
Anion gap: 10 (ref 5–15)
BUN: 8 mg/dL (ref 8–23)
CO2: 24 mmol/L (ref 22–32)
Calcium: 9 mg/dL (ref 8.9–10.3)
Chloride: 106 mmol/L (ref 98–111)
Creatinine, Ser: 0.65 mg/dL (ref 0.44–1.00)
GFR, Estimated: >60 mL/min (ref >60)
Glucose, Bld: 144 mg/dL — ABNORMAL HIGH (ref 70–99)
Potassium: 5.5 mmol/L — ABNORMAL HIGH (ref 3.5–5.1)
Sodium: 140 mmol/L (ref 135–145)

## 2023-07-04 LAB — STREP PNEUMONIAE URINARY ANTIGEN: Strep Pneumo Urinary Antigen: NEGATIVE

## 2023-07-04 LAB — BRAIN NATRIURETIC PEPTIDE
B Natriuretic Peptide: 622.1 pg/mL — ABNORMAL HIGH (ref 0.0–100.0)
B Natriuretic Peptide: 1348.5 pg/mL — ABNORMAL HIGH (ref 0.0–100.0)

## 2023-07-04 LAB — GLUCOSE, CAPILLARY
Glucose-Capillary: 162 mg/dL — ABNORMAL HIGH (ref 70–99)
Glucose-Capillary: 148 mg/dL — ABNORMAL HIGH (ref 70–99)

## 2023-07-04 LAB — CBC
HCT: 38.8 % (ref 36.0–46.0)
Hemoglobin: 12.2 g/dL (ref 12.0–15.0)
MCH: 27.7 pg (ref 26.0–34.0)
MCHC: 31.4 g/dL (ref 30.0–36.0)
MCV: 88.2 fL (ref 80.0–100.0)
Platelets: 301 10*3/uL (ref 150–400)
RBC: 4.4 MIL/uL (ref 3.87–5.11)
RDW: 13.2 % (ref 11.5–15.5)
WBC: 24.6 10*3/uL — ABNORMAL HIGH (ref 4.0–10.5)
nRBC: 0 % (ref 0.0–0.2)

## 2023-07-04 LAB — CBG MONITORING, ED
Glucose-Capillary: 131 mg/dL — ABNORMAL HIGH (ref 70–99)
Glucose-Capillary: 129 mg/dL — ABNORMAL HIGH (ref 70–99)

## 2023-07-04 LAB — PROCALCITONIN: Procalcitonin: 0.24 ng/mL

## 2023-07-04 LAB — MRSA NEXT GEN BY PCR, NASAL: MRSA by PCR Next Gen: NOT DETECTED

## 2023-07-04 MED ORDER — IPRATROPIUM-ALBUTEROL 0.5-2.5 (3) MG/3ML IN SOLN
3.0000 mL | Freq: Four times a day (QID) | RESPIRATORY_TRACT | Status: DC
  Administered 2023-07-04 – 2023-07-05 (×5): 3 mL via RESPIRATORY_TRACT
  Filled 2023-07-04 (×5): qty 3

## 2023-07-04 MED ORDER — DULOXETINE HCL 60 MG PO CPEP
60.0000 mg | ORAL_CAPSULE | Freq: Every day | ORAL | Status: DC
  Administered 2023-07-05 – 2023-07-09 (×5): 60 mg via ORAL
  Filled 2023-07-04 (×5): qty 1

## 2023-07-04 MED ORDER — AMITRIPTYLINE HCL 25 MG PO TABS
25.0000 mg | ORAL_TABLET | Freq: Every day | ORAL | Status: DC
  Administered 2023-07-06: 25 mg via ORAL
  Filled 2023-07-04 (×4): qty 1

## 2023-07-04 MED ORDER — ATORVASTATIN CALCIUM 40 MG PO TABS
40.0000 mg | ORAL_TABLET | Freq: Every day | ORAL | Status: DC
  Administered 2023-07-04 – 2023-07-10 (×7): 40 mg via ORAL
  Filled 2023-07-04 (×7): qty 1

## 2023-07-04 MED ORDER — ENSURE ENLIVE PO LIQD
237.0000 mL | Freq: Two times a day (BID) | ORAL | Status: DC
  Administered 2023-07-05 – 2023-07-10 (×11): 237 mL via ORAL

## 2023-07-04 MED ORDER — LEVOTHYROXINE SODIUM 25 MCG PO TABS
125.0000 ug | ORAL_TABLET | Freq: Every day | ORAL | Status: DC
  Administered 2023-07-05 – 2023-07-10 (×6): 125 ug via ORAL
  Filled 2023-07-04 (×6): qty 1

## 2023-07-04 MED ORDER — SODIUM CHLORIDE 0.9 % IV SOLN: INTRAVENOUS | Status: DC

## 2023-07-04 MED ORDER — ADULT MULTIVITAMIN W/MINERALS CH
1.0000 | ORAL_TABLET | Freq: Every day | ORAL | Status: DC
  Administered 2023-07-04 – 2023-07-10 (×7): 1 via ORAL
  Filled 2023-07-04 (×7): qty 1

## 2023-07-04 MED ORDER — ORAL CARE MOUTH RINSE: 15.0000 mL | OROMUCOSAL | Status: DC | PRN

## 2023-07-04 MED ORDER — SODIUM CHLORIDE 0.9 % IV SOLN
2.0000 g | INTRAVENOUS | Status: AC
  Administered 2023-07-04 – 2023-07-08 (×5): 2 g via INTRAVENOUS
  Filled 2023-07-04 (×5): qty 20

## 2023-07-04 MED ORDER — SODIUM ZIRCONIUM CYCLOSILICATE 10 G PO PACK
10.0000 g | PACK | Freq: Once | ORAL | Status: AC
  Administered 2023-07-04: 10 g via ORAL
  Filled 2023-07-04: qty 1

## 2023-07-04 MED ORDER — CLOPIDOGREL BISULFATE 75 MG PO TABS
75.0000 mg | ORAL_TABLET | Freq: Every day | ORAL | Status: DC
  Administered 2023-07-04 – 2023-07-10 (×7): 75 mg via ORAL
  Filled 2023-07-04 (×8): qty 1

## 2023-07-04 MED ORDER — IOHEXOL 350 MG/ML SOLN
75.0000 mL | Freq: Once | INTRAVENOUS | Status: AC | PRN
  Administered 2023-07-04: 75 mL via INTRAVENOUS

## 2023-07-04 MED ORDER — FUROSEMIDE 10 MG/ML IJ SOLN
40.0000 mg | Freq: Once | INTRAMUSCULAR | Status: AC
  Administered 2023-07-04: 40 mg via INTRAVENOUS
  Filled 2023-07-04: qty 4

## 2023-07-04 MED ORDER — IPRATROPIUM-ALBUTEROL 0.5-2.5 (3) MG/3ML IN SOLN
3.0000 mL | RESPIRATORY_TRACT | Status: DC | PRN
Start: 2023-07-04 — End: 2023-07-10

## 2023-07-04 NOTE — Progress Notes (Signed)
 Initial Nutrition Assessment  DOCUMENTATION CODES:   Severe malnutrition in context of chronic illness  INTERVENTION:  Ensure Enlive po BID, each supplement provides 350 kcal and 20 grams of protein.  Multivitamin with minerals  NUTRITION DIAGNOSIS:   Severe Malnutrition related to chronic illness as evidenced by energy intake < 75% for > or equal to 3 months, percent weight loss.  GOAL:   Patient will meet greater than or equal to 90% of their needs  MONITOR:   PO intake, Supplement acceptance  REASON FOR ASSESSMENT:   Consult Assessment of nutrition requirement/status  ASSESSMENT:   PMH COPD, DM-2, with neuropathy, HTN, chronic back pain, lumbar laminectomy, thoracic compression fraction, hypothyroidism, memory loss and osteoporosis presenting with progressive shortness of breath. Adm acute hypoxic respiratory failure due to COPD exacerbation.  Medications reviewed and include: SSI 0-15 units, synthroid   Labs reviewed: Potassium 5.5 H, CBGs 107-165 x 24h, A1c 5.5%   Intake/Output Summary (Last 24 hours) at 07/04/2023 1621 Last data filed at 07/04/2023 1604 Gross per 24 hour  Intake 2571.24 ml  Output 300 ml  Net 2271.24 ml    Weights reviewed. Admit weight 67.1 kg. -16.8 kg (20%) wt loss in 1 year, -10 kg (13%) in 6 months  NUTRITION - FOCUSED PHYSICAL EXAM:  Flowsheet Row Most Recent Value  Orbital Region Moderate depletion  Upper Arm Region Severe depletion  Thoracic and Lumbar Region Moderate depletion  Buccal Region Mild depletion  Temple Region Mild depletion  Clavicle Bone Region Mild depletion  Clavicle and Acromion Bone Region Moderate depletion  Scapular Bone Region Moderate depletion  Dorsal Hand Moderate depletion  Patellar Region Moderate depletion  Anterior Thigh Region Moderate depletion  Posterior Calf Region Moderate depletion  Edema (RD Assessment) None  Hair Reviewed  Eyes Reviewed  Mouth Reviewed  Skin Reviewed  Nails Reviewed        Diet Order:   Diet Order             Diet Heart Room service appropriate? Yes; Fluid consistency: Thin  Diet effective now                   EDUCATION NEEDS:   Education needs have been addressed  Skin:  Skin Assessment: Skin Integrity Issues: Skin Integrity Issues:: Stage II Stage II: sacrum  Last BM:  4/21  Height:   Ht Readings from Last 1 Encounters:  07/04/23 5\' 6"  (1.676 m)    Weight:   Wt Readings from Last 1 Encounters:  07/04/23 67.1 kg    BMI:  Body mass index is 23.88 kg/m.  Estimated Nutritional Needs:   Kcal:  1700-2000  Protein:  80-105  Fluid:  >1.7 L or per MD  Laren Player, MPH, RD, LDN Clinical Dietitian Contact information can be found at [facility].

## 2023-07-04 NOTE — ED Notes (Signed)
 CBG is 129

## 2023-07-04 NOTE — Progress Notes (Signed)
 PROGRESS NOTE  Laura Dalton BJY:[id number] DOB: 1951-02-12   PCP: Dettinger, Lucio Sabin, MD  Patient is from: Home.  Lives alone.  Uses rolling walker at baseline.   DOA: 07/03/2023 LOS: 1  Chief complaints Chief Complaint  Patient presents with   Respiratory Distress     Brief Narrative / Interim history: 73 y.o. female with PMH of COPD, DM-2, with neuropathy, HTN, chronic back pain, lumbar laminectomy, thoracic compression fraction, hypothyroidism, memory loss and osteoporosis presenting with progressive shortness of breath, DOE, wheezing, weakness and diaphoresis, and admitted with acute respiratory failure with hypoxia and hypercapnia in the setting of COPD exacerbation.  Reportedly saturating at 70% on room air when EMS arrived.  Wheezing with work of breathing as well.  In ED, she was started on BiPAP.  She desaturated to 86% when weaned to 6 L.  She was put back on BiPAP.   Subjective: Seen and examined earlier this morning.  Patient did not receive bronchodilators last night.  Attempted to wean off BiPAP this morning and but she desaturated.  She was put back on BiPAP.  Reports shortness of breath and right-sided chest pain when she was taken off BiPAP.  Patient's son at bedside.  Objective: Vitals:   07/04/23 0715 07/04/23 0730 07/04/23 0745 07/04/23 0900  BP: 122/67 (!) 112/59 110/78 112/89  Pulse: (!) 112 (!) 210 (!) 115 (!) 109  Resp: (!) 24 (!) 32 (!) 30 (!) 28  Temp:      TempSrc:      SpO2: 96% (!) 84% (!) 84% 95%  Weight:      Height:        Examination:  GENERAL: No apparent distress.  Nontoxic. HEENT: MMM.  Vision and hearing grossly intact.  NECK: Supple.  No apparent JVD.  RESP: On BiPAP.  Fair aeration bilaterally. CVS:  RRR. Heart sounds normal.  ABD/GI/GU: BS+. Abd soft, NTND.  MSK/EXT:  Moves extremities. No apparent deformity. No edema.  SKIN: no apparent skin lesion or wound NEURO: Awake, alert and oriented appropriately.  No apparent focal  neuro deficit. PSYCH: Calm. Normal affect.   Consultants:  None  Procedures: None  Microbiology summarized: COVID-19, influenza and RSV PCR nonreactive A 20 pathogen RVP nonreactive  Assessment and plan: Acute respiratory failure with hypoxia and hypercapnia: Likely due to COPD exacerbation.  Patient with progressive respiratory symptoms for about 2 weeks.  Reportedly saturating at 70% on RA when EMS arrived.  Desaturated to 86% when weaned off BiPAP to 6 L.  ABG 7.3/53/27.  She has URI symptoms.  COVID-19 and full RVP nonreactive.  BNP 18.6>>> 622.  CXR without infiltrate.  Received Solu-Medrol  and IV magnesium  from EMS.  Received CTX and Zithromax  in ED. Did not tolerate weaning of BiPAP this morning.  She did not receive bronchodilator last night - Continue IV Solu-Medrol  and Zithromax  starting tomorrow.  - Discontinue Brovana  and Yupelri .  Did not receive last night.  "Medication not available" - Scheduled DuoNeb every 6 hours with as needed every 2 hours - Continue BiPAP.  Wean as able - Stop IV fluid.  Trial of IV Lasix .  Strict intake and output, daily weight - Check CT angio chest to rule out PE although low suspicion. - Check echocardiogram. - Needs controller on discharge. - N.p.o. except sips with meds while on BiPAP   COPD exacerbation:  - Management as above   Hyperglycemia: A1c 5.3%.  Not on medication.  Hyperglycemia likely due to steroid Recent Labs  Lab  07/03/23 1624 07/03/23 2243 07/04/23 0914 07/04/23 1133  GLUCAP 134* 165* 131* 129*  - Continue SSI-moderate   Chronic back pain: Reports using ibuprofen  at home.  Denies using oxycodone  or Robaxin . -Tylenol  as needed   Mood disorder: Stable -Continue home meds after med rec   Hypothyroidism - Continue home Synthroid  after med rec   Essential hypertension - Continue home meds as appropriate   Hyponatremia: Mild - Monitor   Hypokalemia: Resolved.  Hypokalemia: Mild. -Lokelma  10 g x  1 -Recheck  Leukocytosis/bandemia: Likely demargination in the setting of steroid - Continue monitoring  Body mass index is 24.2 kg/m.          DVT prophylaxis:  enoxaparin  (LOVENOX ) injection 40 mg Start: 07/03/23 1600  Code Status: Full code Family Communication: Updated patient's son at bedside Level of care: Progressive Status is: Inpatient Remains inpatient appropriate because: Acute respiratory failure, COPD exacerbation   Final disposition: Likely home once medically stable   55 minutes with more than 50% spent in reviewing records, counseling patient/family and coordinating care.   Sch Meds:  Scheduled Meds:  budesonide  (PULMICORT ) nebulizer solution  0.5 mg Nebulization BID   enoxaparin  (LOVENOX ) injection  40 mg Subcutaneous Q24H   insulin  aspart  0-15 Units Subcutaneous TID WC   insulin  aspart  0-5 Units Subcutaneous QHS   ipratropium-albuterol   3 mL Nebulization Q6H   methylPREDNISolone  (SOLU-MEDROL ) injection  80 mg Intravenous Daily   Continuous Infusions:  azithromycin      PRN Meds:.acetaminophen  **OR** acetaminophen , ipratropium-albuterol , ondansetron  **OR** ondansetron  (ZOFRAN ) IV  Antimicrobials: Anti-infectives (From admission, onward)    Start     Dose/Rate Route Frequency Ordered Stop   07/04/23 1400  azithromycin  (ZITHROMAX ) 250 mg in dextrose  5 % 125 mL IVPB        250 mg 127.5 mL/hr over 60 Minutes Intravenous Every 24 hours 07/03/23 1603     07/03/23 1400  azithromycin  (ZITHROMAX ) 500 mg in sodium chloride  0.9 % 250 mL IVPB        500 mg 250 mL/hr over 60 Minutes Intravenous  Once 07/03/23 1349 07/03/23 1507   07/03/23 1400  cefTRIAXone  (ROCEPHIN ) 1 g in sodium chloride  0.9 % 100 mL IVPB        1 g 200 mL/hr over 30 Minutes Intravenous  Once 07/03/23 1349 07/03/23 1455        I have personally reviewed the following labs and images: CBC: Recent Labs  Lab 07/03/23 1318 07/03/23 1531 07/04/23 0600  WBC 20.7*  --  24.6*   NEUTROABS 16.1*  --   --   HGB 13.4 13.6 12.2  HCT 42.8 40.0 38.8  MCV 86.8  --  88.2  PLT 448*  --  301   BMP &GFR Recent Labs  Lab 07/03/23 1318 07/03/23 1531 07/03/23 1726 07/04/23 0600  NA 134* 138  --  140  K 2.8* 2.6*  --  5.5*  CL 95*  --   --  106  CO2 22  --   --  24  GLUCOSE 354*  --   --  144*  BUN 5*  --   --  8  CREATININE 0.86  --   --  0.65  CALCIUM  9.6  --   --  9.0  MG  --   --  2.2  --    Estimated Creatinine Clearance: 58.6 mL/min (by C-G formula based on SCr of 0.65 mg/dL). Liver & Pancreas: Recent Labs  Lab 07/03/23 1318  AST 24  ALT  9  ALKPHOS 80  BILITOT 0.6  PROT 6.8  ALBUMIN 3.9   No results for input(s): "LIPASE", "AMYLASE" in the last 168 hours. No results for input(s): "AMMONIA" in the last 168 hours. Diabetic: Recent Labs    07/03/23 1716  HGBA1C 5.3   Recent Labs  Lab 07/03/23 1624 07/03/23 2243 07/04/23 0914 07/04/23 1133  GLUCAP 134* 165* 131* 129*   Cardiac Enzymes: No results for input(s): "CKTOTAL", "CKMB", "CKMBINDEX", "TROPONINI" in the last 168 hours. No results for input(s): "PROBNP" in the last 8760 hours. Coagulation Profile: No results for input(s): "INR", "PROTIME" in the last 168 hours. Thyroid  Function Tests: No results for input(s): "TSH", "T4TOTAL", "FREET4", "T3FREE", "THYROIDAB" in the last 72 hours. Lipid Profile: No results for input(s): "CHOL", "HDL", "LDLCALC", "TRIG", "CHOLHDL", "LDLDIRECT" in the last 72 hours. Anemia Panel: No results for input(s): "VITAMINB12", "FOLATE", "FERRITIN", "TIBC", "IRON", "RETICCTPCT" in the last 72 hours. Urine analysis:    Component Value Date/Time   COLORURINE YELLOW 12/11/2022 1742   APPEARANCEUR CLEAR 12/11/2022 1742   APPEARANCEUR Clear 07/28/2021 1634   LABSPEC 1.010 12/11/2022 1742   PHURINE 6.0 12/11/2022 1742   GLUCOSEU NEGATIVE 12/11/2022 1742   HGBUR SMALL (A) 12/11/2022 1742   BILIRUBINUR NEGATIVE 12/11/2022 1742   BILIRUBINUR Negative 07/28/2021  1634   KETONESUR NEGATIVE 12/11/2022 1742   PROTEINUR NEGATIVE 12/11/2022 1742   UROBILINOGEN negative 04/30/2015 1313   NITRITE NEGATIVE 12/11/2022 1742   LEUKOCYTESUR NEGATIVE 12/11/2022 1742   Sepsis Labs: Invalid input(s): "PROCALCITONIN", "LACTICIDVEN"  Microbiology: Recent Results (from the past 240 hours)  Resp panel by RT-PCR (RSV, Flu A&B, Covid) Anterior Nasal Swab     Status: None   Collection Time: 07/03/23  1:11 PM   Specimen: Anterior Nasal Swab  Result Value Ref Range Status   SARS Coronavirus 2 by RT PCR NEGATIVE NEGATIVE Final   Influenza A by PCR NEGATIVE NEGATIVE Final   Influenza B by PCR NEGATIVE NEGATIVE Final    Comment: (NOTE) The Xpert Xpress SARS-CoV-2/FLU/RSV plus assay is intended as an aid in the diagnosis of influenza from Nasopharyngeal swab specimens and should not be used as a sole basis for treatment. Nasal washings and aspirates are unacceptable for Xpert Xpress SARS-CoV-2/FLU/RSV testing.  Fact Sheet for Patients: BloggerCourse.com  Fact Sheet for Healthcare Providers: SeriousBroker.it  This test is not yet approved or cleared by the [place]  FDA and has been authorized for detection and/or diagnosis of SARS-CoV-2 by FDA under an Emergency Use Authorization (EUA). This EUA will remain in effect (meaning this test can be used) for the duration of the COVID-19 declaration under Section 564(b)(1) of the Act, 21 U.S.C. section 360bbb-3(b)(1), unless the authorization is terminated or revoked.     Resp Syncytial Virus by PCR NEGATIVE NEGATIVE Final    Comment: (NOTE) Fact Sheet for Patients: BloggerCourse.com  Fact Sheet for Healthcare Providers: SeriousBroker.it  This test is not yet approved or cleared by the [place]  FDA and has been authorized for detection and/or diagnosis of SARS-CoV-2 by FDA under an Emergency Use  Authorization (EUA). This EUA will remain in effect (meaning this test can be used) for the duration of the COVID-19 declaration under Section 564(b)(1) of the Act, 21 U.S.C. section 360bbb-3(b)(1), unless the authorization is terminated or revoked.  Performed at [facility], [address], [place], [place] [address]   Respiratory (~20 pathogens) panel by PCR     Status: None   Collection Time: 07/03/23  3:49 PM  Specimen: Nasopharyngeal Swab; Respiratory  Result Value Ref Range Status   Adenovirus NOT DETECTED NOT DETECTED Final   Coronavirus 229E NOT DETECTED NOT DETECTED Final    Comment: (NOTE) The Coronavirus on the Respiratory Panel, DOES NOT test for the novel  Coronavirus (2019 nCoV)    Coronavirus HKU1 NOT DETECTED NOT DETECTED Final   Coronavirus NL63 NOT DETECTED NOT DETECTED Final   Coronavirus OC43 NOT DETECTED NOT DETECTED Final   Metapneumovirus NOT DETECTED NOT DETECTED Final   Rhinovirus / Enterovirus NOT DETECTED NOT DETECTED Final   Influenza A NOT DETECTED NOT DETECTED Final   Influenza B NOT DETECTED NOT DETECTED Final   Parainfluenza Virus 1 NOT DETECTED NOT DETECTED Final   Parainfluenza Virus 2 NOT DETECTED NOT DETECTED Final   Parainfluenza Virus 3 NOT DETECTED NOT DETECTED Final   Parainfluenza Virus 4 NOT DETECTED NOT DETECTED Final   Respiratory Syncytial Virus NOT DETECTED NOT DETECTED Final   Bordetella pertussis NOT DETECTED NOT DETECTED Final   Bordetella Parapertussis NOT DETECTED NOT DETECTED Final   Chlamydophila pneumoniae NOT DETECTED NOT DETECTED Final   Mycoplasma pneumoniae NOT DETECTED NOT DETECTED Final    Comment: Performed at [facility], [address] [place], [place] [address]    Radiology Studies: DG Chest Port 1 View Result Date: 07/04/2023 CLINICAL DATA:  Hypoxemia. EXAM: PORTABLE CHEST 1 VIEW COMPARISON:  07/03/2023 FINDINGS: Heart size appears normal scratch set stable cardiomediastinal contours. Aortic  atherosclerosis. New small right pleural effusion. Diffuse increase interstitial markings with septal thickening is new from the previous exam. Decreased aeration to the right base may reflect posterior layering effusion and or atelectasis. IMPRESSION: 1. Suspect mild congestive heart failure, new from previous exam. 2. Decreased aeration to right base may reflect atelectasis and/or effusion. Electronically Signed   By: Kimberley Penman M.D.   On: 07/04/2023 08:06   DG Chest Port 1 View Result Date: 07/03/2023 CLINICAL DATA:  Shortness of breath EXAM: PORTABLE CHEST 1 VIEW COMPARISON:  December 13, 2022 FINDINGS: The heart size and mediastinal contours are within normal limits. Both lungs are clear. The visualized skeletal structures are unremarkable. IMPRESSION: No active disease. Electronically Signed   By: Fredrich Jefferson M.D.   On: 07/03/2023 14:27      Terre Zabriskie T. Jahmai Finelli [facility]  If 7PM-7AM, please contact night-coverage [email or web address] 07/04/2023, 12:03 PM

## 2023-07-04 NOTE — Progress Notes (Signed)
   07/04/23 2301  BiPAP/CPAP/SIPAP  $ Non-Invasive Ventilator  Non-Invasive Vent Set Up  $ Face Mask Small Yes  BiPAP/CPAP/SIPAP Pt Type Adult  BiPAP/CPAP/SIPAP Resmed  Mask Type Full face mask  Dentures removed? Not applicable  Mask Size Small  IPAP 10 cmH20  EPAP 5 cmH2O  Flow Rate 5 lpm  Patient Home Machine No  Patient Home Mask No  Patient Home Tubing No  Auto Titrate No   Pt resting comfortably on bipap.  RT will continue to monitor

## 2023-07-04 NOTE — Evaluation (Signed)
 Physical Therapy Evaluation Patient Details Name: Laura Dalton MRN: [id number] DOB: 1950/03/17 Today's Date: 07/04/2023  History of Present Illness  Patient is a 73 year old female with progressive shortness of breath, admitted with acute on chronic respiratory failure. History of COPD, DM-2, neuropathy, HTN, chronic back pain, lumbar laminectomy, thoracic compression fracture hypothyroidism, memory loss, osteoporosis  Clinical Impression  Patient is agreeable to PT evaluation. She reports she lives at home alone and uses a rolling walker for ambulation.  She has a ramped entrance to her home.  Today the patient required CGA for bed mobility and transfers. Relying on rolling walker for support in standing with no loss of balance. Walking deferred due to fatigue and dyspnea with exertion. On 6 L02 throughout session with Sp02 in the low 90's at rest before and after mobilizing, however unable to get accurate reading during mobility. The patient is hopeful to return home when breathing is improved. PT will continue to follow to maximize independence.       If plan is discharge home, recommend the following: Assist for transportation;Assistance with cooking/housework   Can travel by private vehicle        Equipment Recommendations None recommended by PT  Recommendations for Other Services       Functional Status Assessment Patient has had a recent decline in their functional status and demonstrates the ability to make significant improvements in function in a reasonable and predictable amount of time.     Precautions / Restrictions Precautions Precautions: Fall Recall of Precautions/Restrictions: Intact Restrictions Weight Bearing Restrictions Per Provider Order: No      Mobility  Bed Mobility Overal bed mobility: Needs Assistance Bed Mobility: Supine to Sit, Sit to Supine     Supine to sit: Contact guard Sit to supine: Contact guard assist   General bed mobility  comments: increased time    Transfers Overall transfer level: Needs assistance Equipment used: Rolling walker (2 wheels) Transfers: Sit to/from Stand Sit to Stand: Contact guard assist           General transfer comment: CGA for safety    Ambulation/Gait               General Gait Details: not attempted due to fatigue and dyspnea  Stairs            Wheelchair Mobility     Tilt Bed    Modified Rankin (Stroke Patients Only)       Balance Overall balance assessment: Needs assistance Sitting-balance support: Feet supported Sitting balance-Leahy Scale: Good     Standing balance support: Bilateral upper extremity supported Standing balance-Leahy Scale: Poor Standing balance comment: using rolling walker for support in standing                             Pertinent Vitals/Pain Pain Assessment Pain Assessment: Faces Faces Pain Scale: Hurts little more Pain Location: right side rib cage Pain Descriptors / Indicators: Discomfort Pain Intervention(s): Limited activity within patient's tolerance, Monitored during session, Repositioned    Home Living Family/patient expects to be discharged to:: Private residence Living Arrangements: Alone (grandson there intermittently but does not help) Available Help at Discharge: Available PRN/intermittently Type of Home: House Home Access: Ramped entrance       Home Layout: One level Home Equipment: Agricultural consultant (2 wheels);Wheelchair - manual;Shower seat      Prior Function Prior Level of Function : Independent/Modified Independent  Mobility Comments: RW used for all ambulation inside and outside the home ADLs Comments: no driving recently. Mod I for ADLs     Extremity/Trunk Assessment   Upper Extremity Assessment Upper Extremity Assessment: Defer to OT evaluation    Lower Extremity Assessment Lower Extremity Assessment: Generalized weakness       Communication    Communication Communication: No apparent difficulties    Cognition Arousal: Alert Behavior During Therapy: WFL for tasks assessed/performed   PT - Cognitive impairments: No apparent impairments                         Following commands: Intact       Cueing Cueing Techniques: Verbal cues     General Comments General comments (skin integrity, edema, etc.): Sp02 90's on 6 L02 before and after mobilizing. unable to get accurate reading with mobility due to poor pleth    Exercises     Assessment/Plan    PT Assessment Patient needs continued PT services  PT Problem List Decreased strength;Decreased activity tolerance;Decreased balance;Decreased mobility;Cardiopulmonary status limiting activity;Decreased safety awareness       PT Treatment Interventions DME instruction;Gait training;Stair training;Functional mobility training;Therapeutic activities;Balance training;Therapeutic exercise;Neuromuscular re-education;Cognitive remediation;Patient/family education    PT Goals (Current goals can be found in the Care Plan section)  Acute Rehab PT Goals Patient Stated Goal: to go home PT Goal Formulation: With patient Time For Goal Achievement: 07/18/23 Potential to Achieve Goals: Fair    Frequency Min 2X/week     Co-evaluation               AM-PAC PT "6 Clicks" Mobility  Outcome Measure Help needed turning from your back to your side while in a flat bed without using bedrails?: None Help needed moving from lying on your back to sitting on the side of a flat bed without using bedrails?: A Little Help needed moving to and from a bed to a chair (including a wheelchair)?: A Little Help needed standing up from a chair using your arms (e.g., wheelchair or bedside chair)?: A Little Help needed to walk in hospital room?: A Little Help needed climbing 3-5 steps with a railing? : A Lot 6 Click Score: 18    End of Session   Activity Tolerance: Patient limited by  fatigue Patient left: in bed;with call bell/phone within reach Nurse Communication: Mobility status PT Visit Diagnosis: Muscle weakness (generalized) (M62.81);Unsteadiness on feet (R26.81)    Time: 1131-1150 PT Time Calculation (min) (ACUTE ONLY): 19 min   Charges:   PT Evaluation $PT Eval Moderate Complexity: 1 Mod   PT General Charges $$ ACUTE PT VISIT: 1 Visit         Ozie Bo, PT, MPT   Erlene Hawks 07/04/2023, 1:18 PM

## 2023-07-05 ENCOUNTER — Inpatient Hospital Stay (HOSPITAL_COMMUNITY)

## 2023-07-05 DIAGNOSIS — J441 Chronic obstructive pulmonary disease with (acute) exacerbation: Secondary | ICD-10-CM | POA: Diagnosis not present

## 2023-07-05 DIAGNOSIS — E1142 Type 2 diabetes mellitus with diabetic polyneuropathy: Secondary | ICD-10-CM | POA: Diagnosis not present

## 2023-07-05 DIAGNOSIS — R009 Unspecified abnormalities of heart beat: Secondary | ICD-10-CM | POA: Diagnosis not present

## 2023-07-05 DIAGNOSIS — E039 Hypothyroidism, unspecified: Secondary | ICD-10-CM | POA: Diagnosis not present

## 2023-07-05 DIAGNOSIS — I5041 Acute combined systolic (congestive) and diastolic (congestive) heart failure: Secondary | ICD-10-CM | POA: Diagnosis not present

## 2023-07-05 DIAGNOSIS — J690 Pneumonitis due to inhalation of food and vomit: Secondary | ICD-10-CM | POA: Insufficient documentation

## 2023-07-05 DIAGNOSIS — J9601 Acute respiratory failure with hypoxia: Secondary | ICD-10-CM | POA: Diagnosis not present

## 2023-07-05 DIAGNOSIS — L8990 Pressure ulcer of unspecified site, unspecified stage: Secondary | ICD-10-CM | POA: Insufficient documentation

## 2023-07-05 LAB — CBC
HCT: 39.6 % (ref 36.0–46.0)
Hemoglobin: 13 g/dL (ref 12.0–15.0)
MCH: 27.7 pg (ref 26.0–34.0)
MCHC: 32.8 g/dL (ref 30.0–36.0)
MCV: 84.3 fL (ref 80.0–100.0)
Platelets: 326 10*3/uL (ref 150–400)
RBC: 4.7 MIL/uL (ref 3.87–5.11)
RDW: 13.2 % (ref 11.5–15.5)
WBC: 28.9 10*3/uL — ABNORMAL HIGH (ref 4.0–10.5)
nRBC: 0 % (ref 0.0–0.2)

## 2023-07-05 LAB — RENAL FUNCTION PANEL
Albumin: 3.3 g/dL — ABNORMAL LOW (ref 3.5–5.0)
Anion gap: 18 — ABNORMAL HIGH (ref 5–15)
BUN: 13 mg/dL (ref 8–23)
CO2: 27 mmol/L (ref 22–32)
Calcium: 9.7 mg/dL (ref 8.9–10.3)
Chloride: 96 mmol/L — ABNORMAL LOW (ref 98–111)
Creatinine, Ser: 0.73 mg/dL (ref 0.44–1.00)
GFR, Estimated: >60 mL/min (ref >60)
Glucose, Bld: 149 mg/dL — ABNORMAL HIGH (ref 70–99)
Phosphorus: 2.4 mg/dL — ABNORMAL LOW (ref 2.5–4.6)
Potassium: 3.2 mmol/L — ABNORMAL LOW (ref 3.5–5.1)
Sodium: 141 mmol/L (ref 135–145)

## 2023-07-05 LAB — MAGNESIUM: Magnesium: 1.9 mg/dL (ref 1.7–2.4)

## 2023-07-05 LAB — GLUCOSE, CAPILLARY
Glucose-Capillary: 105 mg/dL — ABNORMAL HIGH (ref 70–99)
Glucose-Capillary: 180 mg/dL — ABNORMAL HIGH (ref 70–99)
Glucose-Capillary: 225 mg/dL — ABNORMAL HIGH (ref 70–99)
Glucose-Capillary: 154 mg/dL — ABNORMAL HIGH (ref 70–99)

## 2023-07-05 LAB — ECHOCARDIOGRAM COMPLETE
Area-P 1/2: 5.97 cm2
Calc EF: 34.2 %
Est EF: 25
Height: 66 in
S' Lateral: 2.8 cm
Single Plane A2C EF: 40.3 %
Single Plane A4C EF: 25.8 %
Weight: 2264.57 [oz_av]

## 2023-07-05 MED ORDER — PERFLUTREN LIPID MICROSPHERE
1.0000 mL | INTRAVENOUS | Status: AC | PRN
  Administered 2023-07-05: 2 mL via INTRAVENOUS

## 2023-07-05 MED ORDER — FUROSEMIDE 10 MG/ML IJ SOLN
40.0000 mg | Freq: Once | INTRAMUSCULAR | Status: AC
  Administered 2023-07-05: 40 mg via INTRAVENOUS
  Filled 2023-07-05: qty 4

## 2023-07-05 MED ORDER — METRONIDAZOLE 500 MG/100ML IV SOLN
500.0000 mg | Freq: Two times a day (BID) | INTRAVENOUS | Status: DC
  Administered 2023-07-05 – 2023-07-07 (×4): 500 mg via INTRAVENOUS
  Filled 2023-07-05 (×4): qty 100

## 2023-07-05 MED ORDER — IPRATROPIUM-ALBUTEROL 0.5-2.5 (3) MG/3ML IN SOLN
3.0000 mL | Freq: Two times a day (BID) | RESPIRATORY_TRACT | Status: DC
  Administered 2023-07-05 – 2023-07-09 (×9): 3 mL via RESPIRATORY_TRACT
  Filled 2023-07-05 (×9): qty 3

## 2023-07-05 MED ORDER — POTASSIUM CHLORIDE CRYS ER 20 MEQ PO TBCR
40.0000 meq | EXTENDED_RELEASE_TABLET | ORAL | Status: AC
  Administered 2023-07-05 (×2): 40 meq via ORAL
  Filled 2023-07-05 (×2): qty 2

## 2023-07-05 MED ORDER — FUROSEMIDE 10 MG/ML IJ SOLN: 40.0000 mg | Freq: Two times a day (BID) | INTRAMUSCULAR | Status: DC

## 2023-07-05 NOTE — Consult Note (Signed)
 Cardiology Consultation   Patient ID: Laura Dalton MRN: [id number]; DOB: [birth year]  Admit date: 07/03/2023 Date of Consult: 07/05/2023  PCP:  Dettinger, Lucio Sabin, MD   [facility] Providers Cardiologist:  Armida Lander, MD        Patient Profile:   Laura Dalton is a 73 y.o. female with a hx of COPD, T2DM, hypothyroidism  who is being seen 07/05/2023 for the evaluation of acute systolic heart failure at the request of Dr. Mae Schlossman.  History of Present Illness:   [name] is a 73 year old female with history of COPD, T2DM, hypothyroidism who we are consulted by Dr. Gonfa for evaluation of acute systolic heart failure.  She presented with worsening shortness of breath.  States that she had a viral URI about 2 weeks ago, reported cough and shortness of breath.  Also reports right-sided pleuritic chest pain.  Reports shortness of breath worsened, prompting her to call EMS on 4/22.  She was found to be wheezy and diaphoretic and initial SpO2 down to 70% on room air.  She was started on BiPAP in the ED.  Labs notable for creatinine 0.86, sodium 134, potassium 2.8, glucose 354, WBC 21, platelets 448, BNP 19 (repeat BNP yesterday 622 then 1349), respiratory viral panel negative, procalcitonin 0.24.  CTPA showed large right lower lobe airspace opacity most consistent with pneumonia and with small associated right pleural effusion.  EKG shows sinus tachycardia, rate 106, anterior Q waves, inferior Q waves.  She was initially given IV fluids on admission but had worsening BNP and has been started on Lasix .  Started on ceftriaxone  for pneumonia.  She is currently on 10 L HFNC.  Echocardiogram today showed EF 25% with apical akinesis suggesting either Takotsubo cardiomyopathy or LAD infarct.   Past Medical History:  Diagnosis Date   Bursitis of hip    Diabetes mellitus without complication (HCC)    type II   Hyperlipidemia    Hypertension    Hypothyroidism    Low back pain     benign turmor back   Stroke (HCC)    Vitamin D  deficiency disease     Past Surgical History:  Procedure Laterality Date   APPENDECTOMY     BACK SURGERY  1996   turmor on back    CHOLECYSTECTOMY     LEG SURGERY Right    "clot and had drain in leg  rt lower leg      Inpatient Medications: Scheduled Meds:  amitriptyline   25 mg Oral QHS   atorvastatin   40 mg Oral Daily   budesonide  (PULMICORT ) nebulizer solution  0.5 mg Nebulization BID   clopidogrel   75 mg Oral Daily   DULoxetine   60 mg Oral QHS   enoxaparin  (LOVENOX ) injection  40 mg Subcutaneous Q24H   feeding supplement  237 mL Oral BID BM   furosemide   40 mg Intravenous Q12H   insulin  aspart  0-15 Units Subcutaneous TID WC   insulin  aspart  0-5 Units Subcutaneous QHS   ipratropium-albuterol   3 mL Nebulization BID   levothyroxine   125 mcg Oral QAC breakfast   methylPREDNISolone  (SOLU-MEDROL ) injection  80 mg Intravenous Daily   multivitamin with minerals  1 tablet Oral Daily   Continuous Infusions:  azithromycin  250 mg (07/05/23 1710)   cefTRIAXone  (ROCEPHIN )  IV 2 g (07/05/23 1531)   metronidazole  500 mg (07/05/23 1311)   PRN Meds: acetaminophen  **OR** acetaminophen , ipratropium-albuterol , ondansetron  **OR** ondansetron  (ZOFRAN ) IV, mouth rinse  Allergies:    Allergies  Allergen Reactions   Aspirin  Hives   Gabapentin  Other (See Comments)    Dizzy,falls    Social History:   Social History   Socioeconomic History   Marital status: Widowed    Spouse name: Not on file   Number of children: 3   Years of education: 14   Highest education level: Associate degree: academic program  Occupational History   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   Children out of town   She depends on her neighbor a lot for yard work and to check on her daily   Social Drivers of Corporate investment banker Strain: Low Risk  (06/21/2023)   Overall Financial Resource Strain (CARDIA)    Difficulty of Paying Living Expenses: Not hard at all  Food Insecurity: No Food Insecurity (07/04/2023)   Hunger Vital Sign    Worried About Running Out of Food in the Last Year: Never true    Ran Out of Food in the Last Year: Never true  Transportation Needs: No Transportation Needs (07/04/2023)   PRAPARE - Administrator, Civil Service (Medical): No    Lack of Transportation (Non-Medical): No  Physical Activity: Inactive (01/31/2023)   Exercise Vital Sign    Days of Exercise per Week: 0 days    Minutes of Exercise per Session: 0 min  Stress: No Stress Concern Present (06/21/2023)   [employer] of Occupational Health - Occupational Stress Questionnaire    Feeling of Stress : Not at all  Social Connections: Moderately Integrated (07/04/2023)   Social Connection and Isolation Panel [NHANES]    Frequency of Communication with Friends and Family: Twice a week    Frequency of Social Gatherings with Friends and Family: Twice a week    Attends Religious Services: More than 4 times per year    Active Member of [employer] or Organizations: Yes    Attends Banker Meetings: More than 4 times per year    Marital Status: Widowed  Intimate Partner Violence: Not At Risk (07/04/2023)   Humiliation, Afraid, Rape, and Kick questionnaire    Fear of Current or Ex-Partner: No    Emotionally Abused: No    Physically Abused: No    Sexually Abused: No    Family History:    Family History   Problem Relation Age of Onset   Diabetes Mother    Stroke Mother    Seizures Mother    Cancer Father        prostate   COPD Sister    COPD Sister    COPD Sister    Cancer Brother        lung   Cancer Brother        prostate   COPD Brother    COPD Brother    Thyroid  disease Son  Hashimoto's thyroiditis Son    Breast cancer Neg Hx      ROS:  Please see the history of present illness.   All other ROS reviewed and negative.     Physical Exam/Data:   Vitals:   07/05/23 1119 07/05/23 1400 07/05/23 1438 07/05/23 1551  BP: 101/64  113/62 109/74  Pulse: (!) 109   (!) 106  Resp: 20   20  Temp: 99 F (37.2 C)  99 F (37.2 C) 99.2 F (37.3 C)  TempSrc: Oral  Oral Oral  SpO2: 100% 99%  98%  Weight:      Height:        Intake/Output Summary (Last 24 hours) at 07/05/2023 1802 Last data filed at 07/05/2023 1627 Gross per 24 hour  Intake 552.21 ml  Output 800 ml  Net -247.79 ml      07/05/2023    5:31 AM 07/04/2023    1:18 PM 07/03/2023    1:19 PM  Last 3 Weights  Weight (lbs) 141 lb 8.6 oz 147 lb 14.9 oz 149 lb 14.6 oz  Weight (kg) 64.2 kg 67.1 kg 68 kg     Body mass index is 22.84 kg/m.  General: Appears ill HEENT: normal Neck: no JVD Vascular: No carotid bruits Cardiac:  normal S1, S2; RRR; no murmur  Lungs: Diminished breath sounds Abd: soft, nontender Ext: no edema Musculoskeletal:  No deformities, BUE and BLE strength normal and equal Skin: warm and dry  Neuro:   no focal abnormalities noted Psych:  Normal affect   EKG:  The EKG was personally reviewed and demonstrates:  sinus tachycardia, rate 106, anterior Q waves, inferior Q waves Telemetry:  Telemetry was personally reviewed and demonstrates: Normal sinus rhythm  Relevant CV Studies:   Laboratory Data:  High Sensitivity Troponin:  No results for input(s): "TROPONINIHS" in the last 720 hours.   Chemistry Recent Labs  Lab 07/03/23 1318 07/03/23 1531 07/03/23 1726 07/04/23 0600  07/05/23 0836  NA 134* 138  --  140 141  K 2.8* 2.6*  --  5.5* 3.2*  CL 95*  --   --  106 96*  CO2 22  --   --  24 27  GLUCOSE 354*  --   --  144* 149*  BUN 5*  --   --  8 13  CREATININE 0.86  --   --  0.65 0.73  CALCIUM  9.6  --   --  9.0 9.7  MG  --   --  2.2  --  1.9  GFRNONAA >60  --   --  >60 >60  ANIONGAP 17*  --   --  10 18*    Recent Labs  Lab 07/03/23 1318 07/05/23 0836  PROT 6.8  --   ALBUMIN 3.9 3.3*  AST 24  --   ALT 9  --   ALKPHOS 80  --   BILITOT 0.6  --    Lipids No results for input(s): "CHOL", "TRIG", "HDL", "LABVLDL", "LDLCALC", "CHOLHDL" in the last 168 hours.  Hematology Recent Labs  Lab 07/03/23 1318 07/03/23 1531 07/04/23 0600 07/05/23 0836  WBC 20.7*  --  24.6* 28.9*  RBC 4.93  --  4.40 4.70  HGB 13.4 13.6 12.2 13.0  HCT 42.8 40.0 38.8 39.6  MCV 86.8  --  88.2 84.3  MCH 27.2  --  27.7 27.7  MCHC 31.3  --  31.4 32.8  RDW 13.0  --  13.2 13.2  PLT 448*  --  301  326   Thyroid  No results for input(s): "TSH", "FREET4" in the last 168 hours.  BNP Recent Labs  Lab 07/03/23 1311 07/04/23 0747 07/04/23 1322  BNP 18.6 622.1* 1,348.5*    DDimer No results for input(s): "DDIMER" in the last 168 hours.   Radiology/Studies:  ECHOCARDIOGRAM COMPLETE Result Date: 07/05/2023    ECHOCARDIOGRAM REPORT   Patient Name:   Laura Dalton Date of Exam: 07/05/2023 Medical Rec #:  [id number]         Height:       66.0 in Accession #:    [id number]        Weight:       141.5 lb Date of Birth:  1950-12-02          BSA:          1.727 m Patient Age:    72 years          BP:           101/64 mmHg Patient Gender: F                 HR:           114 bpm. Exam Location:  Inpatient Procedure: 2D Echo, Cardiac Doppler, Color Doppler and Intracardiac            Opacification Agent (Both Spectral and Color Flow Doppler were            utilized during procedure). Indications:    R00.9* Unspecified abnormalities of heart beat  History:        Patient has no prior history of  Echocardiogram examinations.                 COPD, Signs/Symptoms:Shortness of Breath and Dyspnea; Risk                 Factors:Diabetes, Hypertension and Dyslipidemia.  Sonographer:    Raynelle Callow RDCS Referring Phys: [id number] Ella Gun GONFA IMPRESSIONS  1. Left ventricular ejection fraction, by estimation, is 25%. The left ventricle has severely decreased function. The left ventricle demonstrates regional wall motion abnormalities with normal function of the LV basal segments and akinesis of the LV mid  to apical segments. This suggests either a stress (Takotsubo-type) cardiomyopathy or severe coronary disease. There is mild concentric left ventricular hypertrophy. Left ventricular diastolic parameters are indeterminate.  2. Right ventricular systolic function is normal. The right ventricular size is normal. Tricuspid regurgitation signal is inadequate for assessing PA pressure.  3. The mitral valve is normal in structure. No evidence of mitral valve regurgitation. No evidence of mitral stenosis.  4. The aortic valve is tricuspid. There is mild calcification of the aortic valve. Aortic valve regurgitation is not visualized. No aortic stenosis is present.  5. The inferior vena cava is normal in size with greater than 50% respiratory variability, suggesting right atrial pressure of 3 mmHg.  6. A small pericardial effusion is present. FINDINGS  Left Ventricle: Left ventricular ejection fraction, by estimation, is 25%. The left ventricle has severely decreased function. The left ventricle demonstrates regional wall motion abnormalities. The left ventricular internal cavity size was normal in size. There is mild concentric left ventricular hypertrophy. Left ventricular diastolic parameters are indeterminate. Right Ventricle: The right ventricular size is normal. No increase in right ventricular wall thickness. Right ventricular systolic function is normal. Tricuspid regurgitation signal is inadequate for assessing PA  pressure. Left Atrium: Left atrial size was normal in size. Right Atrium: Right atrial size was  normal in size. Pericardium: A small pericardial effusion is present. Mitral Valve: The mitral valve is normal in structure. No evidence of mitral valve regurgitation. No evidence of mitral valve stenosis. Tricuspid Valve: The tricuspid valve is normal in structure. Tricuspid valve regurgitation is not demonstrated. Aortic Valve: The aortic valve is tricuspid. There is mild calcification of the aortic valve. Aortic valve regurgitation is not visualized. No aortic stenosis is present. Pulmonic Valve: The pulmonic valve was normal in structure. Pulmonic valve regurgitation is not visualized. Aorta: The aortic root is normal in size and structure. Venous: The inferior vena cava is normal in size with greater than 50% respiratory variability, suggesting right atrial pressure of 3 mmHg. IAS/Shunts: No atrial level shunt detected by color flow Doppler.  LEFT VENTRICLE PLAX 2D LVIDd:         4.20 cm LVIDs:         2.80 cm LV PW:         1.50 cm LV IVS:        1.50 cm LVOT diam:     2.30 cm LV SV:         55 LV SV Index:   32 LVOT Area:     4.15 cm  LV Volumes (MOD) LV vol d, MOD A2C: 79.4 ml LV vol d, MOD A4C: 85.7 ml LV vol s, MOD A2C: 47.4 ml LV vol s, MOD A4C: 63.6 ml LV SV MOD A2C:     32.0 ml LV SV MOD A4C:     85.7 ml LV SV MOD BP:      30.2 ml RIGHT VENTRICLE             IVC RV S prime:     13.20 cm/s  IVC diam: 1.90 cm TAPSE (M-mode): 1.3 cm LEFT ATRIUM             Index        RIGHT ATRIUM          Index LA diam:        3.10 cm 1.80 cm/m   RA Area:     8.43 cm LA Vol (A2C):   26.9 ml 15.58 ml/m  RA Volume:   14.10 ml 8.17 ml/m LA Vol (A4C):   14.4 ml 8.34 ml/m LA Biplane Vol: 19.5 ml 11.29 ml/m  AORTIC VALVE LVOT Vmax:   91.50 cm/s LVOT Vmean:  62.600 cm/s LVOT VTI:    0.133 m  AORTA Ao Root diam: 2.90 cm Ao Asc diam:  3.20 cm MITRAL VALVE MV Area (PHT): 5.97 cm     SHUNTS MV Decel Time: 127 msec     Systemic  VTI:  0.13 m MV E velocity: 142.00 cm/s  Systemic Diam: 2.30 cm Dalton McleanMD Electronically signed by Archer Bear Signature Date/Time: 07/05/2023/2:42:25 PM    Final    CT Angio Chest Pulmonary Embolism (PE) W or WO Contrast Result Date: 07/04/2023 CLINICAL DATA:  Shortness of breath, chest pain. EXAM: CT ANGIOGRAPHY CHEST WITH CONTRAST TECHNIQUE: Multidetector CT imaging of the chest was performed using the standard protocol during bolus administration of intravenous contrast. Multiplanar CT image reconstructions and MIPs were obtained to evaluate the vascular anatomy. RADIATION DOSE REDUCTION: This exam was performed according to the departmental dose-optimization program which includes automated exposure control, adjustment of the mA and/or kV according to patient size and/or use of iterative reconstruction technique. CONTRAST:  75mL OMNIPAQUE  IOHEXOL  350 MG/ML SOLN COMPARISON:  Aug 02, 2015. FINDINGS: Cardiovascular: Satisfactory opacification of the  pulmonary arteries to the segmental level. No evidence of pulmonary embolism. Normal heart size. No pericardial effusion. Mediastinum/Nodes: No enlarged mediastinal, hilar, or axillary lymph nodes. Thyroid  gland, trachea, and esophagus demonstrate no significant findings. Lungs/Pleura: No pneumothorax is noted. Mild emphysematous disease is noted in the upper lobes. Large right lower lobe airspace opacity is noted consistent with pneumonia with small associated pleural effusion. Minimal left basilar subsegmental atelectasis is noted. Mild opacity is noted posteriorly in right upper lobe most consistent with pneumonia as well. Upper Abdomen: Probable small right renal calculus. Musculoskeletal: No chest wall abnormality. No acute or significant osseous findings. Review of the MIP images confirms the above findings. IMPRESSION: No definite evidence of pulmonary embolus. Large right lower lobe airspace opacity is noted most consistent with pneumonia with small  associated right pleural effusion. Mild opacity is noted posteriorly in right upper lobe most consistent with pneumonia as well. Probable small right renal calculus. Aortic Atherosclerosis (ICD10-I70.0) and Emphysema (ICD10-J43.9). Electronically Signed   By: Rosalene Colon M.D.   On: 07/04/2023 13:27   DG Chest Port 1 View Result Date: 07/04/2023 CLINICAL DATA:  Hypoxemia. EXAM: PORTABLE CHEST 1 VIEW COMPARISON:  07/03/2023 FINDINGS: Heart size appears normal scratch set stable cardiomediastinal contours. Aortic atherosclerosis. New small right pleural effusion. Diffuse increase interstitial markings with septal thickening is new from the previous exam. Decreased aeration to the right base may reflect posterior layering effusion and or atelectasis. IMPRESSION: 1. Suspect mild congestive heart failure, new from previous exam. 2. Decreased aeration to right base may reflect atelectasis and/or effusion. Electronically Signed   By: Kimberley Penman M.D.   On: 07/04/2023 08:06   DG Chest Port 1 View Result Date: 07/03/2023 CLINICAL DATA:  Shortness of breath EXAM: PORTABLE CHEST 1 VIEW COMPARISON:  December 13, 2022 FINDINGS: The heart size and mediastinal contours are within normal limits. Both lungs are clear. The visualized skeletal structures are unremarkable. IMPRESSION: No active disease. Electronically Signed   By: Fredrich Jefferson M.D.   On: 07/03/2023 14:27     Assessment and Plan:   Acute combined heart failure: Echocardiogram today showed EF 25% with apical akinesis suggesting either Takotsubo cardiomyopathy in setting of her pneumonia versus LAD territory infarct. - Will need ischemic evaluation at some point, but not currently a good candidate given her respiratory failure, currently on 10 L [place].  Can consider cath once respiratory status improves - Will add GDMT as able, currently limited by soft BP - Recommend discontinuing IV Lasix , she appears euvolemic on exam and IVC small/collapsible on  echocardiogram  Acute hypoxic respiratory failure: Due to pneumonia.  Currently on 10 L HFNC.  On ceftriaxone .  Also being treated for COPD exacerbation, currently on IV Solu-Medrol  and scheduled DuoNebs    For questions or updates, please contact [facility] Please consult [email or web address] for contact info under    Signed, Wendie Hamburg, MD  07/05/2023 6:02 PM

## 2023-07-05 NOTE — Plan of Care (Signed)
  Problem: Coping: Goal: Ability to adjust to condition or change in health will improve Outcome: Progressing   Problem: Health Behavior/Discharge Planning: Goal: Ability to identify and utilize available resources and services will improve Outcome: Progressing   

## 2023-07-05 NOTE — Progress Notes (Signed)
  Echocardiogram 2D Echocardiogram has been performed.  Laura Dalton 07/05/2023, 2:37 PM

## 2023-07-05 NOTE — Progress Notes (Signed)
 PROGRESS NOTE  Laura Dalton WUJ:[id number] DOB: March 06, 1951   PCP: Dettinger, Lucio Sabin, MD  Patient is from: Home.  Lives alone.  Uses rolling walker at baseline.   DOA: 07/03/2023 LOS: 2  Chief complaints Chief Complaint  Patient presents with   Respiratory Distress     Brief Narrative / Interim history: 73 y.o. female with PMH of COPD, DM-2, with neuropathy, HTN, chronic back pain, lumbar laminectomy, thoracic compression fraction, hypothyroidism, memory loss and osteoporosis presenting with progressive shortness of breath, DOE, wheezing, weakness and diaphoresis, and admitted with acute respiratory failure with hypoxia and hypercapnia in the setting of COPD exacerbation.  Reportedly saturating at 70% on room air when EMS arrived.  Wheezing with work of breathing as well.  In ED, she was started on BiPAP.  She desaturated to 86% when weaned to 6 L.  She was put back on BiPAP.   The next day, patient remained on BiPAP.  CT angio chest negative for PE but large RLL airspace opacity with small associated right pleural effusion and mild opacity in RUL consistent with pneumonia.  BNP trended up to 622.  IV fluid discontinued.  Started on ceftriaxone  and Lasix .  Subjective: Seen and examined earlier this morning.  No major events overnight of this morning.  Reports feeling hot.  Shortness of breath with activity.  She is currently saturating in upper 90s to 100 on 7 L by [place] but desaturates to 80s with minimal activity.  Objective: Vitals:   07/05/23 0751 07/05/23 0800 07/05/23 0836 07/05/23 1119  BP: 103/66   101/64  Pulse: (!) 109  (!) 108 (!) 109  Resp: (!) 22 20 20 20   Temp: 99.3 F (37.4 C)   99 F (37.2 C)  TempSrc: Oral   Oral  SpO2: 94%  95% 100%  Weight:      Height:        Examination:  GENERAL: No apparent distress.  Nontoxic. HEENT: MMM.  Vision and hearing grossly intact.  NECK: Supple.  No apparent JVD.  RESP: No IWOB.  Diminished aeration bilaterally. CVS:   RRR. Heart sounds normal.  ABD/GI/GU: BS+. Abd soft, NTND.  MSK/EXT:  Moves extremities. No apparent deformity. No edema.  SKIN: no apparent skin lesion or wound NEURO: Awake, alert and oriented appropriately.  No apparent focal neuro deficit. PSYCH: Calm. Normal affect.   Consultants:  None  Procedures: None  Microbiology summarized: COVID-19, influenza and RSV PCR nonreactive A 20 pathogen RVP nonreactive MRSA PCR screen negative.  Assessment and plan: Acute respiratory failure with hypoxia and hypercapnia: Likely due to COPD exacerbation and pneumonia.  Progressive respiratory symptoms for about 2 weeks.  Reportedly saturating at 70% on RA when EMS arrived.  Desaturated to 86% when weaned off BiPAP to 6 L.  ABG 7.3/53/27.  She has URI symptoms.  COVID-19 and full RVP nonreactive.  BNP 18.6>>> 622> 1300.  CXR without infiltrate.  CT angio chest negative for PE but RLL and RUL infiltrate raising suspicion for aspiration pneumonia.  MRSA PCR screen negative.  Currently saturating in upper 90s to 100% on 7 L at rest but desaturates with minimal activity. - Continue IV Solu-Medrol  and Zithromax  starting tomorrow.  - Scheduled DuoNeb every 6 hours with as needed every 2 hours - Ceftriaxone  2 g daily 4/23>>> - IV Flagyl  4/24>>  - IV Lasix  - Wean oxygen as able,  Incentive spirometry OOB and PT/OT, - BiPAP as needed - Follow echocardiogram - Needs controller on discharge.  Right  lung pneumonia/COPD exacerbation:  - Management as above  Elevated BNP: BNP trended from 16-1300.  No history of CHF.  Appears euvolemic on exam. -IV Lasix  and echocardiogram as above -Strict intake and output, daily weight, renal functions and electrolyte   Hyperglycemia: A1c 5.3%.  Not on medication.  Hyperglycemia likely due to steroid Recent Labs  Lab 07/04/23 1133 07/04/23 1604 07/04/23 2114 07/05/23 0629 07/05/23 1120  GLUCAP 129* 162* 148* 105* 180*  -Continue SSI-moderate   Chronic back  pain: Reports using ibuprofen  at home.  Denies using oxycodone  or Robaxin . -Tylenol  as needed   Mood disorder: Stable -Continue home meds after med rec   Hypothyroidism - Continue home Synthroid  after med rec   Essential hypertension - Continue home meds as appropriate   Hyponatremia: Mild - Monitor   Hypokalemia:  - Monitor replenish as appropriate  Hyperkalemia: Resolved.  Leukocytosis/bandemia: Likely due to pneumonia and steroid - Continue monitoring  Severe malnutrition Body mass index is 22.84 kg/m. Nutrition Problem: Severe Malnutrition Etiology: chronic illness Signs/Symptoms: energy intake < 75% for > or equal to 3 months, percent weight loss Percent weight loss: 13 % Interventions: Ensure Enlive (each supplement provides 350kcal and 20 grams of protein), MVI  Stage II sacral decubitus: Present on admission Pressure Injury 07/04/23 Sacrum Right Stage 2 -  Partial thickness loss of dermis presenting as a shallow open injury with a red, pink wound bed without slough. dry (Active)  07/04/23 1353  Location: Sacrum  Location Orientation: Right  Staging: Stage 2 -  Partial thickness loss of dermis presenting as a shallow open injury with a red, pink wound bed without slough.  Wound Description (Comments): dry  Present on Admission: Yes  Dressing Type Foam - Lift dressing to assess site every shift 07/05/23 0821   DVT prophylaxis:  enoxaparin  (LOVENOX ) injection 40 mg Start: 07/03/23 1600  Code Status: Full code Family Communication: Updated patient's son at bedside Level of care: Progressive Status is: Inpatient Remains inpatient appropriate because: Acute respiratory failure, COPD exacerbation   Final disposition: Likely home once medically stable   55 minutes with more than 50% spent in reviewing records, counseling patient/family and coordinating care.   Sch Meds:  Scheduled Meds:  amitriptyline   25 mg Oral QHS   atorvastatin   40 mg Oral Daily    budesonide  (PULMICORT ) nebulizer solution  0.5 mg Nebulization BID   clopidogrel   75 mg Oral Daily   DULoxetine   60 mg Oral QHS   enoxaparin  (LOVENOX ) injection  40 mg Subcutaneous Q24H   feeding supplement  237 mL Oral BID BM   insulin  aspart  0-15 Units Subcutaneous TID WC   insulin  aspart  0-5 Units Subcutaneous QHS   ipratropium-albuterol   3 mL Nebulization BID   levothyroxine   125 mcg Oral QAC breakfast   methylPREDNISolone  (SOLU-MEDROL ) injection  80 mg Intravenous Daily   multivitamin with minerals  1 tablet Oral Daily   potassium chloride   40 mEq Oral Q3H   Continuous Infusions:  azithromycin  250 mg (07/04/23 1405)   cefTRIAXone  (ROCEPHIN )  IV 2 g (07/04/23 1521)   PRN Meds:.acetaminophen  **OR** acetaminophen , ipratropium-albuterol , ondansetron  **OR** ondansetron  (ZOFRAN ) IV, mouth rinse  Antimicrobials: Anti-infectives (From admission, onward)    Start     Dose/Rate Route Frequency Ordered Stop   07/04/23 1445  cefTRIAXone  (ROCEPHIN ) 2 g in sodium chloride  0.9 % 100 mL IVPB        2 g 200 mL/hr over 30 Minutes Intravenous Every 24 hours 07/04/23 1352 07/09/23 1444  07/04/23 1400  azithromycin  (ZITHROMAX ) 250 mg in dextrose  5 % 125 mL IVPB        250 mg 127.5 mL/hr over 60 Minutes Intravenous Every 24 hours 07/03/23 1603     07/03/23 1400  azithromycin  (ZITHROMAX ) 500 mg in sodium chloride  0.9 % 250 mL IVPB        500 mg 250 mL/hr over 60 Minutes Intravenous  Once 07/03/23 1349 07/03/23 1507   07/03/23 1400  cefTRIAXone  (ROCEPHIN ) 1 g in sodium chloride  0.9 % 100 mL IVPB        1 g 200 mL/hr over 30 Minutes Intravenous  Once 07/03/23 1349 07/03/23 1455        I have personally reviewed the following labs and images: CBC: Recent Labs  Lab 07/03/23 1318 07/03/23 1531 07/04/23 0600 07/05/23 0836  WBC 20.7*  --  24.6* 28.9*  NEUTROABS 16.1*  --   --   --   HGB 13.4 13.6 12.2 13.0  HCT 42.8 40.0 38.8 39.6  MCV 86.8  --  88.2 84.3  PLT 448*  --  301 326   BMP  &GFR Recent Labs  Lab 07/03/23 1318 07/03/23 1531 07/03/23 1726 07/04/23 0600 07/05/23 0836  NA 134* 138  --  140 141  K 2.8* 2.6*  --  5.5* 3.2*  CL 95*  --   --  106 96*  CO2 22  --   --  24 27  GLUCOSE 354*  --   --  144* 149*  BUN 5*  --   --  8 13  CREATININE 0.86  --   --  0.65 0.73  CALCIUM  9.6  --   --  9.0 9.7  MG  --   --  2.2  --  1.9  PHOS  --   --   --   --  2.4*   Estimated Creatinine Clearance: 58.6 mL/min (by C-G formula based on SCr of 0.73 mg/dL). Liver & Pancreas: Recent Labs  Lab 07/03/23 1318 07/05/23 0836  AST 24  --   ALT 9  --   ALKPHOS 80  --   BILITOT 0.6  --   PROT 6.8  --   ALBUMIN 3.9 3.3*   No results for input(s): "LIPASE", "AMYLASE" in the last 168 hours. No results for input(s): "AMMONIA" in the last 168 hours. Diabetic: Recent Labs    07/03/23 1716  HGBA1C 5.3   Recent Labs  Lab 07/04/23 1133 07/04/23 1604 07/04/23 2114 07/05/23 0629 07/05/23 1120  GLUCAP 129* 162* 148* 105* 180*   Cardiac Enzymes: No results for input(s): "CKTOTAL", "CKMB", "CKMBINDEX", "TROPONINI" in the last 168 hours. No results for input(s): "PROBNP" in the last 8760 hours. Coagulation Profile: No results for input(s): "INR", "PROTIME" in the last 168 hours. Thyroid  Function Tests: No results for input(s): "TSH", "T4TOTAL", "FREET4", "T3FREE", "THYROIDAB" in the last 72 hours. Lipid Profile: No results for input(s): "CHOL", "HDL", "LDLCALC", "TRIG", "CHOLHDL", "LDLDIRECT" in the last 72 hours. Anemia Panel: No results for input(s): "VITAMINB12", "FOLATE", "FERRITIN", "TIBC", "IRON", "RETICCTPCT" in the last 72 hours. Urine analysis:    Component Value Date/Time   COLORURINE YELLOW 12/11/2022 1742   APPEARANCEUR CLEAR 12/11/2022 1742   APPEARANCEUR Clear 07/28/2021 1634   LABSPEC 1.010 12/11/2022 1742   PHURINE 6.0 12/11/2022 1742   GLUCOSEU NEGATIVE 12/11/2022 1742   HGBUR SMALL (A) 12/11/2022 1742   BILIRUBINUR NEGATIVE 12/11/2022 1742    BILIRUBINUR Negative 07/28/2021 1634   KETONESUR NEGATIVE 12/11/2022 1742  PROTEINUR NEGATIVE 12/11/2022 1742   UROBILINOGEN negative 04/30/2015 1313   NITRITE NEGATIVE 12/11/2022 1742   LEUKOCYTESUR NEGATIVE 12/11/2022 1742   Sepsis Labs: Invalid input(s): "PROCALCITONIN", "LACTICIDVEN"  Microbiology: Recent Results (from the past 240 hours)  Resp panel by RT-PCR (RSV, Flu A&B, Covid) Anterior Nasal Swab     Status: None   Collection Time: 07/03/23  1:11 PM   Specimen: Anterior Nasal Swab  Result Value Ref Range Status   SARS Coronavirus 2 by RT PCR NEGATIVE NEGATIVE Final   Influenza A by PCR NEGATIVE NEGATIVE Final   Influenza B by PCR NEGATIVE NEGATIVE Final    Comment: (NOTE) The Xpert Xpress SARS-CoV-2/FLU/RSV plus assay is intended as an aid in the diagnosis of influenza from Nasopharyngeal swab specimens and should not be used as a sole basis for treatment. Nasal washings and aspirates are unacceptable for Xpert Xpress SARS-CoV-2/FLU/RSV testing.  Fact Sheet for Patients: BloggerCourse.com  Fact Sheet for Healthcare Providers: SeriousBroker.it  This test is not yet approved or cleared by the [place]  FDA and has been authorized for detection and/or diagnosis of SARS-CoV-2 by FDA under an Emergency Use Authorization (EUA). This EUA will remain in effect (meaning this test can be used) for the duration of the COVID-19 declaration under Section 564(b)(1) of the Act, 21 U.S.C. section 360bbb-3(b)(1), unless the authorization is terminated or revoked.     Resp Syncytial Virus by PCR NEGATIVE NEGATIVE Final    Comment: (NOTE) Fact Sheet for Patients: BloggerCourse.com  Fact Sheet for Healthcare Providers: SeriousBroker.it  This test is not yet approved or cleared by the [place]  FDA and has been authorized for detection and/or diagnosis of SARS-CoV-2 by FDA  under an Emergency Use Authorization (EUA). This EUA will remain in effect (meaning this test can be used) for the duration of the COVID-19 declaration under Section 564(b)(1) of the Act, 21 U.S.C. section 360bbb-3(b)(1), unless the authorization is terminated or revoked.  Performed at [facility], [address] [place], [place] [address]   Respiratory (~20 pathogens) panel by PCR     Status: None   Collection Time: 07/03/23  3:49 PM   Specimen: Nasopharyngeal Swab; Respiratory  Result Value Ref Range Status   Adenovirus NOT DETECTED NOT DETECTED Final   Coronavirus 229E NOT DETECTED NOT DETECTED Final    Comment: (NOTE) The Coronavirus on the Respiratory Panel, DOES NOT test for the novel  Coronavirus (2019 nCoV)    Coronavirus HKU1 NOT DETECTED NOT DETECTED Final   Coronavirus NL63 NOT DETECTED NOT DETECTED Final   Coronavirus OC43 NOT DETECTED NOT DETECTED Final   Metapneumovirus NOT DETECTED NOT DETECTED Final   Rhinovirus / Enterovirus NOT DETECTED NOT DETECTED Final   Influenza A NOT DETECTED NOT DETECTED Final   Influenza B NOT DETECTED NOT DETECTED Final   Parainfluenza Virus 1 NOT DETECTED NOT DETECTED Final   Parainfluenza Virus 2 NOT DETECTED NOT DETECTED Final   Parainfluenza Virus 3 NOT DETECTED NOT DETECTED Final   Parainfluenza Virus 4 NOT DETECTED NOT DETECTED Final   Respiratory Syncytial Virus NOT DETECTED NOT DETECTED Final   Bordetella pertussis NOT DETECTED NOT DETECTED Final   Bordetella Parapertussis NOT DETECTED NOT DETECTED Final   Chlamydophila pneumoniae NOT DETECTED NOT DETECTED Final   Mycoplasma pneumoniae NOT DETECTED NOT DETECTED Final    Comment: Performed at [facility], [address] [place], [place] [address]  MRSA Next Gen by PCR, Nasal     Status: None  Collection Time: 07/04/23  1:57 PM   Specimen: Urine, Clean Catch; Nasal Swab  Result Value Ref Range Status   MRSA by PCR Next Gen NOT DETECTED NOT DETECTED Final     Comment: (NOTE) The GeneXpert MRSA Assay (FDA approved for NASAL specimens only), is one component of a comprehensive MRSA colonization surveillance program. It is not intended to diagnose MRSA infection nor to guide or monitor treatment for MRSA infections. Test performance is not FDA approved in patients less than 54 years old. Performed at [facility], [address], [place], [place] [address]     Radiology Studies: CT Angio Chest Pulmonary Embolism (PE) W or WO Contrast Result Date: 07/04/2023 CLINICAL DATA:  Shortness of breath, chest pain. EXAM: CT ANGIOGRAPHY CHEST WITH CONTRAST TECHNIQUE: Multidetector CT imaging of the chest was performed using the standard protocol during bolus administration of intravenous contrast. Multiplanar CT image reconstructions and MIPs were obtained to evaluate the vascular anatomy. RADIATION DOSE REDUCTION: This exam was performed according to the departmental dose-optimization program which includes automated exposure control, adjustment of the mA and/or kV according to patient size and/or use of iterative reconstruction technique. CONTRAST:  75mL OMNIPAQUE  IOHEXOL  350 MG/ML SOLN COMPARISON:  Aug 02, 2015. FINDINGS: Cardiovascular: Satisfactory opacification of the pulmonary arteries to the segmental level. No evidence of pulmonary embolism. Normal heart size. No pericardial effusion. Mediastinum/Nodes: No enlarged mediastinal, hilar, or axillary lymph nodes. Thyroid  gland, trachea, and esophagus demonstrate no significant findings. Lungs/Pleura: No pneumothorax is noted. Mild emphysematous disease is noted in the upper lobes. Large right lower lobe airspace opacity is noted consistent with pneumonia with small associated pleural effusion. Minimal left basilar subsegmental atelectasis is noted. Mild opacity is noted posteriorly in right upper lobe most consistent with pneumonia as well. Upper Abdomen: Probable small right renal calculus. Musculoskeletal: No  chest wall abnormality. No acute or significant osseous findings. Review of the MIP images confirms the above findings. IMPRESSION: No definite evidence of pulmonary embolus. Large right lower lobe airspace opacity is noted most consistent with pneumonia with small associated right pleural effusion. Mild opacity is noted posteriorly in right upper lobe most consistent with pneumonia as well. Probable small right renal calculus. Aortic Atherosclerosis (ICD10-I70.0) and Emphysema (ICD10-J43.9). Electronically Signed   By: Rosalene Colon M.D.   On: 07/04/2023 13:27      Katalin Colledge T. Javione Gunawan [facility]  If 7PM-7AM, please contact night-coverage [email or web address] 07/05/2023, 12:28 PM

## 2023-07-05 NOTE — Plan of Care (Signed)
 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  Increased Lasix  to 40 mg twice daily.  Cardiology consulted

## 2023-07-05 NOTE — TOC CM/SW Note (Signed)
 Transition of Care [facility]) - Inpatient Brief Assessment   Patient Details  Name: Laura Dalton MRN: [id number] Date of Birth: 03/25/1950  [facility]) CM/SW Contact:    Tom-Johnson, Angelique Ken, RN Phone Number: 07/05/2023, 1:17 PM   Clinical Narrative:  Patient presented to the ED with worsening Chest Congestion, Shortness of Breath, Weakness and Diaphoresis. Admitted with Acute Resp Failure with Hypoxia.  Currently on IV abx, Neb tx, IV Solumedrol and 7L HFNC acute. Does not use home O2.   From home with her grandson, has three supportive children. Independent with care prior to admit. States she has all necessary DME's at home.  PCP is Dettinger, Lucio Sabin, MD and uses [employer] on [address] in [place]. Also uses [facility].   Patient is active with Home health disciplines from [place]. Resumption of care referral sent and [facility] noted acceptance, info on AVS. BSC ordered from [employer] and [facility] to deliver to patient at bedside.   Patient not Medically ready for discharge.  CM will continue to follow as patient progresses with care towards discharge.          Transition of Care Asessment: Insurance and Status: Insurance coverage has been reviewed Patient has primary care physician: Yes Home environment has been reviewed: Yes Prior level of function:: Modified Independent Prior/Current Home Services: Current home services Recruitment consultant) Social Drivers of Health Review: SDOH reviewed no interventions necessary Readmission risk has been reviewed: Yes Transition of care needs: transition of care needs identified, TOC will continue to follow

## 2023-07-05 NOTE — Evaluation (Signed)
 Occupational Therapy Evaluation Patient Details Name: Laura Dalton MRN: [id number] DOB: 06-17-1950 Today's Date: 07/05/2023   History of Present Illness   Patient is a 73 year old female with progressive shortness of breath, admitted with acute on chronic respiratory failure. History of COPD, DM-2, neuropathy, HTN, chronic back pain, lumbar laminectomy, thoracic compression fracture hypothyroidism, memory loss, osteoporosis     Clinical Impressions Pt lives with her grandson who works during the day. She walks with a RW and is independent in self care and light IADLs. Pt reports having to take rest breaks with exertion prior to admission. Pt presents with generalized weakness, decreased standing balance, poor endurance, chronic back pain and impaired awareness of fall risk. Pt requires supervision to EOB, min assist to stand and CGA to transfer to [facility]. She fatigues quickly. Pt is currently dependent on 10L O2 to maintain SpO2> 90%. Resting HR of 111 bpm, up to 131 bpm with transfers. She needs set up to total assist for ADLs. Pt needs to function modified independently for discharge home. Currently recommending HHOT.      If plan is discharge home, recommend the following:   A little help with walking and/or transfers;A little help with bathing/dressing/bathroom;Assistance with cooking/housework;Assist for transportation;Help with stairs or ramp for entrance     Functional Status Assessment   Patient has had a recent decline in their functional status and demonstrates the ability to make significant improvements in function in a reasonable and predictable amount of time.     Equipment Recommendations   BSC/3in1     Recommendations for Other Services         Precautions/Restrictions   Precautions Precautions: Fall Recall of Precautions/Restrictions: Impaired Restrictions Weight Bearing Restrictions Per Provider Order: No     Mobility Bed Mobility Overal bed  mobility: Needs Assistance Bed Mobility: Supine to Sit, Sit to Supine     Supine to sit: Supervision Sit to supine: Supervision   General bed mobility comments: assist for lines    Transfers Overall transfer level: Needs assistance Equipment used: Rolling walker (2 wheels) Transfers: Sit to/from Stand, Bed to chair/wheelchair/BSC Sit to Stand: Min assist     Step pivot transfers: Contact guard assist     General transfer comment: assist to boost from low bed using momentum, CGA from [facility]      Balance Overall balance assessment: Needs assistance   Sitting balance-Leahy Scale: Good     Standing balance support: Bilateral upper extremity supported Standing balance-Leahy Scale: Poor Standing balance comment: using rolling walker for support in standing                           ADL either performed or assessed with clinical judgement   ADL Overall ADL's : Needs assistance/impaired Eating/Feeding: Independent   Grooming: Wash/dry face;Sitting;Set up   Upper Body Bathing: Minimal assistance;Sitting   Lower Body Bathing: Minimal assistance;Sit to/from stand   Upper Body Dressing : Set up;Sitting   Lower Body Dressing: Minimal assistance;Sit to/from stand   Toilet Transfer: Minimal assistance;Stand-pivot;Rolling walker (2 wheels)   Toileting- Clothing Manipulation and Hygiene: Total assistance;Sit to/from stand               Vision Baseline Vision/History: 1 Wears glasses Ability to See in Adequate Light: 0 Adequate Patient Visual Report: No change from baseline       Perception         Praxis  Pertinent Vitals/Pain Pain Assessment Pain Assessment: Faces Faces Pain Scale: Hurts little more Pain Location: chronic back Pain Descriptors / Indicators: Discomfort, Grimacing     Extremity/Trunk Assessment Upper Extremity Assessment Upper Extremity Assessment: Overall WFL for tasks assessed   Lower Extremity Assessment Lower  Extremity Assessment: Defer to PT evaluation   Cervical / Trunk Assessment Cervical / Trunk Assessment: Kyphotic;Other exceptions (chronic back)   Communication Communication Communication: Impaired Factors Affecting Communication: Hearing impaired   Cognition Arousal: Alert Behavior During Therapy: WFL for tasks assessed/performed               OT - Cognition Comments: impaired safety awareness                 Following commands: Intact       Cueing  General Comments   Cueing Techniques: Verbal cues      Exercises     Shoulder Instructions      Home Living Family/patient expects to be discharged to:: Private residence Living Arrangements: Other relatives (grandson) Available Help at Discharge: Family;Available PRN/intermittently (grandson works) Type of Home: House Home Access: Ramped entrance     Home Layout: One level     Bathroom Shower/Tub: Producer, television/film/video: Standard     Home Equipment: Agricultural consultant (2 wheels);Wheelchair - manual;Shower seat;Hand held shower head          Prior Functioning/Environment Prior Level of Function : Independent/Modified Independent             Mobility Comments: RW used for all ambulation inside and outside the home ADLs Comments: no driving recently. Mod I for ADLs    OT Problem List: Decreased strength;Decreased activity tolerance;Impaired balance (sitting and/or standing);Pain;Decreased safety awareness;Decreased knowledge of use of DME or AE;Cardiopulmonary status limiting activity   OT Treatment/Interventions: Self-care/ADL training;DME and/or AE instruction;Patient/family education;Balance training;Therapeutic activities;Energy conservation      OT Goals(Current goals can be found in the care plan section)   Acute Rehab OT Goals OT Goal Formulation: With patient Time For Goal Achievement: 07/19/23 Potential to Achieve Goals: Good ADL Goals Pt Will Perform Grooming: with  modified independence;standing Pt Will Perform Lower Body Bathing: with modified independence;sit to/from stand Pt Will Perform Lower Body Dressing: with modified independence Pt Will Transfer to Toilet: with modified independence;ambulating Pt Will Perform Toileting - Clothing Manipulation and hygiene: with modified independence;sit to/from stand Additional ADL Goal #1: Pt will employ pursed lip breathing techniques and energy conservation strategies in ADLs as instructed. Additional ADL Goal #2: Pt will gather items necessary for ADLs with RW mod I.   OT Frequency:  Min 2X/week    Co-evaluation              AM-PAC OT "6 Clicks" Daily Activity     Outcome Measure Help from another person eating meals?: None Help from another person taking care of personal grooming?: A Little Help from another person toileting, which includes using toliet, bedpan, or urinal?: A Lot Help from another person bathing (including washing, rinsing, drying)?: A Lot Help from another person to put on and taking off regular upper body clothing?: A Little Help from another person to put on and taking off regular lower body clothing?: A Little 6 Click Score: 17   End of Session Equipment Utilized During Treatment: Rolling walker (2 wheels);Oxygen (10L) Nurse Communication: Other (comment) (pt is hallucinating)  Activity Tolerance: Patient limited by fatigue Patient left: in bed;with call bell/phone within reach;with bed alarm set;Other (comment) (SW  in room)  OT Visit Diagnosis: Unsteadiness on feet (R26.81);Other abnormalities of gait and mobility (R26.89);Muscle weakness (generalized) (M62.81);Pain                Time: 1030-1056 OT Time Calculation (min): 26 min Charges:  OT General Charges $OT Visit: 1 Visit OT Evaluation $OT Eval Moderate Complexity: 1 Mod OT Treatments $Self Care/Home Management : 8-22 mins  Avanell Leigh, OTR/L Acute Rehabilitation Services Office: [phone]   Jonette Nestle 07/05/2023, 12:52 PM

## 2023-07-06 ENCOUNTER — Other Ambulatory Visit (HOSPITAL_COMMUNITY): Payer: Self-pay

## 2023-07-06 ENCOUNTER — Telehealth (HOSPITAL_COMMUNITY): Payer: Self-pay | Admitting: Pharmacy Technician

## 2023-07-06 DIAGNOSIS — J441 Chronic obstructive pulmonary disease with (acute) exacerbation: Secondary | ICD-10-CM | POA: Diagnosis not present

## 2023-07-06 DIAGNOSIS — I5041 Acute combined systolic (congestive) and diastolic (congestive) heart failure: Secondary | ICD-10-CM | POA: Diagnosis not present

## 2023-07-06 DIAGNOSIS — E1142 Type 2 diabetes mellitus with diabetic polyneuropathy: Secondary | ICD-10-CM | POA: Diagnosis not present

## 2023-07-06 DIAGNOSIS — E039 Hypothyroidism, unspecified: Secondary | ICD-10-CM | POA: Diagnosis not present

## 2023-07-06 DIAGNOSIS — J9601 Acute respiratory failure with hypoxia: Secondary | ICD-10-CM | POA: Diagnosis not present

## 2023-07-06 LAB — CBC WITH DIFFERENTIAL/PLATELET
Abs Immature Granulocytes: 0.2 10*3/uL — ABNORMAL HIGH (ref 0.00–0.07)
Basophils Absolute: 0 10*3/uL (ref 0.0–0.1)
Basophils Relative: 0 %
Eosinophils Absolute: 0 10*3/uL (ref 0.0–0.5)
Eosinophils Relative: 0 %
HCT: 38.7 % (ref 36.0–46.0)
Hemoglobin: 12.6 g/dL (ref 12.0–15.0)
Immature Granulocytes: 1 %
Lymphocytes Relative: 6 %
Lymphs Abs: 1.5 10*3/uL (ref 0.7–4.0)
MCH: 27.7 pg (ref 26.0–34.0)
MCHC: 32.6 g/dL (ref 30.0–36.0)
MCV: 85.1 fL (ref 80.0–100.0)
Monocytes Absolute: 2.3 10*3/uL — ABNORMAL HIGH (ref 0.1–1.0)
Monocytes Relative: 9 %
Neutro Abs: 20.7 10*3/uL — ABNORMAL HIGH (ref 1.7–7.7)
Neutrophils Relative %: 84 %
Platelets: 290 10*3/uL (ref 150–400)
RBC: 4.55 MIL/uL (ref 3.87–5.11)
RDW: 13.4 % (ref 11.5–15.5)
WBC: 24.8 10*3/uL — ABNORMAL HIGH (ref 4.0–10.5)
nRBC: 0 % (ref 0.0–0.2)

## 2023-07-06 LAB — RENAL FUNCTION PANEL
Albumin: 3.1 g/dL — ABNORMAL LOW (ref 3.5–5.0)
Anion gap: 10 (ref 5–15)
BUN: 12 mg/dL (ref 8–23)
CO2: 27 mmol/L (ref 22–32)
Calcium: 8.8 mg/dL — ABNORMAL LOW (ref 8.9–10.3)
Chloride: 99 mmol/L (ref 98–111)
Creatinine, Ser: 0.71 mg/dL (ref 0.44–1.00)
GFR, Estimated: >60 mL/min (ref >60)
Glucose, Bld: 119 mg/dL — ABNORMAL HIGH (ref 70–99)
Phosphorus: 2.3 mg/dL — ABNORMAL LOW (ref 2.5–4.6)
Potassium: 4.6 mmol/L (ref 3.5–5.1)
Sodium: 136 mmol/L (ref 135–145)

## 2023-07-06 LAB — GLUCOSE, CAPILLARY
Glucose-Capillary: 109 mg/dL — ABNORMAL HIGH (ref 70–99)
Glucose-Capillary: 96 mg/dL (ref 70–99)
Glucose-Capillary: 205 mg/dL — ABNORMAL HIGH (ref 70–99)
Glucose-Capillary: 151 mg/dL — ABNORMAL HIGH (ref 70–99)

## 2023-07-06 LAB — LEGIONELLA PNEUMOPHILA SEROGP 1 UR AG: L. pneumophila Serogp 1 Ur Ag: NEGATIVE

## 2023-07-06 LAB — MAGNESIUM: Magnesium: 1.9 mg/dL (ref 1.7–2.4)

## 2023-07-06 MED ORDER — MAGNESIUM SULFATE 2 GM/50ML IV SOLN
2.0000 g | Freq: Once | INTRAVENOUS | Status: AC
  Administered 2023-07-06: 2 g via INTRAVENOUS
  Filled 2023-07-06: qty 50

## 2023-07-06 NOTE — Progress Notes (Signed)
 Rounding Note    Patient Name: Laura Dalton Date of Encounter: 07/06/2023  [facility] Cardiologist: Armida Lander, MD   Subjective   BP 93/72 this morning.  On HFNC 6 L.  Creatinine stable at 0.71.  Reports dyspnea improving, denies any chest pain  Inpatient Medications    Scheduled Meds:  amitriptyline   25 mg Oral QHS   atorvastatin   40 mg Oral Daily   budesonide  (PULMICORT ) nebulizer solution  0.5 mg Nebulization BID   clopidogrel   75 mg Oral Daily   DULoxetine   60 mg Oral QHS   enoxaparin  (LOVENOX ) injection  40 mg Subcutaneous Q24H   feeding supplement  237 mL Oral BID BM   insulin  aspart  0-15 Units Subcutaneous TID WC   insulin  aspart  0-5 Units Subcutaneous QHS   ipratropium-albuterol   3 mL Nebulization BID   levothyroxine   125 mcg Oral QAC breakfast   methylPREDNISolone  (SOLU-MEDROL ) injection  80 mg Intravenous Daily   multivitamin with minerals  1 tablet Oral Daily   Continuous Infusions:  azithromycin  250 mg (07/05/23 1710)   cefTRIAXone  (ROCEPHIN )  IV 2 g (07/05/23 1531)   metronidazole  500 mg (07/06/23 0233)   PRN Meds: acetaminophen  **OR** acetaminophen , ipratropium-albuterol , ondansetron  **OR** ondansetron  (ZOFRAN ) IV, mouth rinse   Vital Signs    Vitals:   07/05/23 1934 07/06/23 0033 07/06/23 0444 07/06/23 0813  BP: 111/69 93/71 93/72  115/63  Pulse: (!) 111 96 98 (!) 109  Resp: 20 20 20  (!) 26  Temp: 99.2 F (37.3 C) 99.2 F (37.3 C) 99.2 F (37.3 C)   TempSrc: Oral Oral Oral   SpO2: 93% 100% 100% 100%  Weight:   69.9 kg   Height:        Intake/Output Summary (Last 24 hours) at 07/06/2023 0841 Last data filed at 07/06/2023 0400 Gross per 24 hour  Intake 535.21 ml  Output 950 ml  Net -414.79 ml      07/06/2023    4:44 AM 07/05/2023    5:31 AM 07/04/2023    1:18 PM  Last 3 Weights  Weight (lbs) 154 lb 1.6 oz 141 lb 8.6 oz 147 lb 14.9 oz  Weight (kg) 69.9 kg 64.2 kg 67.1 kg      Telemetry    NSR - Personally  Reviewed  ECG    No new ECG - Personally Reviewed  Physical Exam   GEN: No acute distress.   Neck: No JVD Cardiac: RRR, no murmurs, rubs, or gallops.  Respiratory: Diminished breath sounds on R GI: Soft, nontender, non-distended  MS: No edema; No deformity. Neuro:  Nonfocal  Psych: Normal affect   Labs    High Sensitivity Troponin:  No results for input(s): "TROPONINIHS" in the last 720 hours.   Chemistry Recent Labs  Lab 07/03/23 1318 07/03/23 1531 07/03/23 1726 07/04/23 0600 07/05/23 0836 07/06/23 0349  NA 134*   < >  --  140 141 136  K 2.8*   < >  --  5.5* 3.2* 4.6  CL 95*  --   --  106 96* 99  CO2 22  --   --  24 27 27   GLUCOSE 354*  --   --  144* 149* 119*  BUN 5*  --   --  8 13 12   CREATININE 0.86  --   --  0.65 0.73 0.71  CALCIUM  9.6  --   --  9.0 9.7 8.8*  MG  --   --  2.2  --  1.9 1.9  PROT 6.8  --   --   --   --   --   ALBUMIN 3.9  --   --   --  3.3* 3.1*  AST 24  --   --   --   --   --   ALT 9  --   --   --   --   --   ALKPHOS 80  --   --   --   --   --   BILITOT 0.6  --   --   --   --   --   GFRNONAA >60  --   --  >60 >60 >60  ANIONGAP 17*  --   --  10 18* 10   < > = values in this interval not displayed.    Lipids No results for input(s): "CHOL", "TRIG", "HDL", "LABVLDL", "LDLCALC", "CHOLHDL" in the last 168 hours.  Hematology Recent Labs  Lab 07/04/23 0600 07/05/23 0836 07/06/23 0349  WBC 24.6* 28.9* 24.8*  RBC 4.40 4.70 4.55  HGB 12.2 13.0 12.6  HCT 38.8 39.6 38.7  MCV 88.2 84.3 85.1  MCH 27.7 27.7 27.7  MCHC 31.4 32.8 32.6  RDW 13.2 13.2 13.4  PLT 301 326 290   Thyroid  No results for input(s): "TSH", "FREET4" in the last 168 hours.  BNP Recent Labs  Lab 07/03/23 1311 07/04/23 0747 07/04/23 1322  BNP 18.6 622.1* 1,348.5*    DDimer No results for input(s): "DDIMER" in the last 168 hours.   Radiology    ECHOCARDIOGRAM COMPLETE Result Date: 07/05/2023    ECHOCARDIOGRAM REPORT   Patient Name:   Laura Dalton Tomeo Date of Exam:  07/05/2023 Medical Rec #:  [id number]         Height:       66.0 in Accession #:    [id number]        Weight:       141.5 lb Date of Birth:  October 10, 1950          BSA:          1.727 m Patient Age:    72 years          BP:           101/64 mmHg Patient Gender: F                 HR:           114 bpm. Exam Location:  Inpatient Procedure: 2D Echo, Cardiac Doppler, Color Doppler and Intracardiac            Opacification Agent (Both Spectral and Color Flow Doppler were            utilized during procedure). Indications:    R00.9* Unspecified abnormalities of heart beat  History:        Patient has no prior history of Echocardiogram examinations.                 COPD, Signs/Symptoms:Shortness of Breath and Dyspnea; Risk                 Factors:Diabetes, Hypertension and Dyslipidemia.  Sonographer:    Raynelle Callow RDCS Referring Phys: [id number] Laura Dalton IMPRESSIONS  1. Left ventricular ejection fraction, by estimation, is 25%. The left ventricle has severely decreased function. The left ventricle demonstrates regional wall motion abnormalities with normal function of the LV basal segments and akinesis of the LV mid  to apical segments. This suggests  either a stress (Takotsubo-type) cardiomyopathy or severe coronary disease. There is mild concentric left ventricular hypertrophy. Left ventricular diastolic parameters are indeterminate.  2. Right ventricular systolic function is normal. The right ventricular size is normal. Tricuspid regurgitation signal is inadequate for assessing PA pressure.  3. The mitral valve is normal in structure. No evidence of mitral valve regurgitation. No evidence of mitral stenosis.  4. The aortic valve is tricuspid. There is mild calcification of the aortic valve. Aortic valve regurgitation is not visualized. No aortic stenosis is present.  5. The inferior vena cava is normal in size with greater than 50% respiratory variability, suggesting right atrial pressure of 3 mmHg.  6. A small pericardial  effusion is present. FINDINGS  Left Ventricle: Left ventricular ejection fraction, by estimation, is 25%. The left ventricle has severely decreased function. The left ventricle demonstrates regional wall motion abnormalities. The left ventricular internal cavity size was normal in size. There is mild concentric left ventricular hypertrophy. Left ventricular diastolic parameters are indeterminate. Right Ventricle: The right ventricular size is normal. No increase in right ventricular wall thickness. Right ventricular systolic function is normal. Tricuspid regurgitation signal is inadequate for assessing PA pressure. Left Atrium: Left atrial size was normal in size. Right Atrium: Right atrial size was normal in size. Pericardium: A small pericardial effusion is present. Mitral Valve: The mitral valve is normal in structure. No evidence of mitral valve regurgitation. No evidence of mitral valve stenosis. Tricuspid Valve: The tricuspid valve is normal in structure. Tricuspid valve regurgitation is not demonstrated. Aortic Valve: The aortic valve is tricuspid. There is mild calcification of the aortic valve. Aortic valve regurgitation is not visualized. No aortic stenosis is present. Pulmonic Valve: The pulmonic valve was normal in structure. Pulmonic valve regurgitation is not visualized. Aorta: The aortic root is normal in size and structure. Venous: The inferior vena cava is normal in size with greater than 50% respiratory variability, suggesting right atrial pressure of 3 mmHg. IAS/Shunts: No atrial level shunt detected by color flow Doppler.  LEFT VENTRICLE PLAX 2D LVIDd:         4.20 cm LVIDs:         2.80 cm LV PW:         1.50 cm LV IVS:        1.50 cm LVOT diam:     2.30 cm LV SV:         55 LV SV Index:   32 LVOT Area:     4.15 cm  LV Volumes (MOD) LV vol d, MOD A2C: 79.4 ml LV vol d, MOD A4C: 85.7 ml LV vol s, MOD A2C: 47.4 ml LV vol s, MOD A4C: 63.6 ml LV SV MOD A2C:     32.0 ml LV SV MOD A4C:     85.7 ml LV  SV MOD BP:      30.2 ml RIGHT VENTRICLE             IVC RV S prime:     13.20 cm/s  IVC diam: 1.90 cm TAPSE (M-mode): 1.3 cm LEFT ATRIUM             Index        RIGHT ATRIUM          Index LA diam:        3.10 cm 1.80 cm/m   RA Area:     8.43 cm LA Vol (A2C):   26.9 ml 15.58 ml/m  RA Volume:   14.10 ml  8.17 ml/m LA Vol (A4C):   14.4 ml 8.34 ml/m LA Biplane Vol: 19.5 ml 11.29 ml/m  AORTIC VALVE LVOT Vmax:   91.50 cm/s LVOT Vmean:  62.600 cm/s LVOT VTI:    0.133 m  AORTA Ao Root diam: 2.90 cm Ao Asc diam:  3.20 cm MITRAL VALVE MV Area (PHT): 5.97 cm     SHUNTS MV Decel Time: 127 msec     Systemic VTI:  0.13 m MV E velocity: 142.00 cm/s  Systemic Diam: 2.30 cm Dalton McleanMD Electronically signed by Archer Bear Signature Date/Time: 07/05/2023/2:42:25 PM    Final    CT Angio Chest Pulmonary Embolism (PE) W or WO Contrast Result Date: 07/04/2023 CLINICAL DATA:  Shortness of breath, chest pain. EXAM: CT ANGIOGRAPHY CHEST WITH CONTRAST TECHNIQUE: Multidetector CT imaging of the chest was performed using the standard protocol during bolus administration of intravenous contrast. Multiplanar CT image reconstructions and MIPs were obtained to evaluate the vascular anatomy. RADIATION DOSE REDUCTION: This exam was performed according to the departmental dose-optimization program which includes automated exposure control, adjustment of the mA and/or kV according to patient size and/or use of iterative reconstruction technique. CONTRAST:  75mL OMNIPAQUE  IOHEXOL  350 MG/ML SOLN COMPARISON:  Aug 02, 2015. FINDINGS: Cardiovascular: Satisfactory opacification of the pulmonary arteries to the segmental level. No evidence of pulmonary embolism. Normal heart size. No pericardial effusion. Mediastinum/Nodes: No enlarged mediastinal, hilar, or axillary lymph nodes. Thyroid  gland, trachea, and esophagus demonstrate no significant findings. Lungs/Pleura: No pneumothorax is noted. Mild emphysematous disease is noted in the upper  lobes. Large right lower lobe airspace opacity is noted consistent with pneumonia with small associated pleural effusion. Minimal left basilar subsegmental atelectasis is noted. Mild opacity is noted posteriorly in right upper lobe most consistent with pneumonia as well. Upper Abdomen: Probable small right renal calculus. Musculoskeletal: No chest wall abnormality. No acute or significant osseous findings. Review of the MIP images confirms the above findings. IMPRESSION: No definite evidence of pulmonary embolus. Large right lower lobe airspace opacity is noted most consistent with pneumonia with small associated right pleural effusion. Mild opacity is noted posteriorly in right upper lobe most consistent with pneumonia as well. Probable small right renal calculus. Aortic Atherosclerosis (ICD10-I70.0) and Emphysema (ICD10-J43.9). Electronically Signed   By: Rosalene Colon M.D.   On: 07/04/2023 13:27    Cardiac Studies     Patient Profile     73 y.o. female with a hx of COPD, T2DM, hypothyroidism  who is being seen for the evaluation of acute systolic heart failure  Assessment & Plan    Acute combined heart failure: Echocardiogram 4/24 showed EF 25% with apical akinesis suggesting either Takotsubo cardiomyopathy in setting of her pneumonia versus LAD territory infarct. - Will need ischemic evaluation at some point, but not currently a good candidate given her respiratory failure, currently on 6L [place].  Can consider cath once respiratory status improves - Will add GDMT as able, currently limited by soft BP - Recommend discontinuing IV Lasix , she appears euvolemic on exam and IVC small/collapsible on echocardiogram   Acute hypoxic respiratory failure: Due to pneumonia.  Currently on 6L HFNC.  On ceftriaxone .  Also being treated for COPD exacerbation, currently on IV Solu-Medrol  and scheduled DuoNebs  For questions or updates, please contact [facility] Please consult [email or web address] for  contact info under        Signed, Wendie Hamburg, MD  07/06/2023, 8:41 AM

## 2023-07-06 NOTE — Telephone Encounter (Signed)
 Patient Product/process development scientist completed.    The patient is insured through [facility]. Patient has Medicare and is not eligible for a copay card, but may be able to apply for patient assistance or Medicare RX Payment Plan (Patient Must reach out to their plan, if eligible for payment plan), if available.    Ran test claim for Entresto 24-26 mg and the current 30 day co-pay is $229.14 due to a deductible.  Ran test claim for Farxiga 10 mg and the current 30 day co-pay is $229.14 due to a deductible.  Ran test claim for Jardiance 10 mg and the current 30 day co-pay is $229.14 due to a deductible.   This test claim was processed through [facility]- copay amounts may vary at other pharmacies due to pharmacy/plan contracts, or as the patient moves through the different stages of their insurance plan.     Morgan Arab, CPHT Pharmacy Technician III Certified Patient Advocate [facility] Patient Advocate Team Direct Number: [phone]  Fax: [phone]

## 2023-07-06 NOTE — TOC Progression Note (Addendum)
 Transition of Care [facility]) - Progression Note    Patient Details  Name: Laura Dalton MRN: [id number] Date of Birth: 14-Jun-1950  [facility] of [facility]) CM/SW Contact  Jennett Model, RN Phone Number: 07/06/2023, 3:37 PM  Clinical Narrative:    NCM offered choice for HHPT, HHOT, she states she does not have a preference,  NCM made referral to Amy with [facility], she is able to take referral.  Soc will begin 24 to 48 hrs post dc.  Patient states she already has a bsc at home.  Looks like she is already set up with [facility], so she will stay with [facility].          Expected Discharge Plan and Services                                               Social Determinants of Health (SDOH) Interventions SDOH Screenings   Food Insecurity: No Food Insecurity (07/04/2023)  Housing: Low Risk  (07/04/2023)  Transportation Needs: No Transportation Needs (07/04/2023)  Utilities: Not At Risk (07/04/2023)  Alcohol  Screen: Low Risk  (01/31/2023)  Depression (PHQ2-9): Low Risk  (06/21/2023)  Financial Resource Strain: Low Risk  (06/21/2023)  Physical Activity: Inactive (01/31/2023)  Social Connections: Moderately Integrated (07/04/2023)  Stress: No Stress Concern Present (06/21/2023)  Tobacco Use: Medium Risk (07/03/2023)  Health Literacy: Adequate Health Literacy (06/21/2023)    Readmission Risk Interventions    07/05/2023    1:16 PM 06/02/2023    1:39 PM 06/01/2023    1:04 PM  Readmission Risk Prevention Plan  Post Dischage Appt Complete    Medication Screening Complete Complete Complete  Transportation Screening Complete Complete Complete

## 2023-07-06 NOTE — Plan of Care (Signed)
   Problem: Coping: Goal: Ability to adjust to condition or change in health will improve Outcome: Progressing

## 2023-07-06 NOTE — Progress Notes (Signed)
   07/06/23 2201  BiPAP/CPAP/SIPAP  Reason BIPAP/CPAP not in use Non-compliant  BiPAP/CPAP /SiPAP Vitals  Pulse Rate 78  Resp 20  SpO2 97 %  Bilateral Breath Sounds Rhonchi;Diminished  MEWS Score/Color  MEWS Score 0  MEWS Score Color Laura Dalton

## 2023-07-06 NOTE — Progress Notes (Signed)
 Physical Therapy Treatment Patient Details Name: Laura Dalton MRN: [id number] DOB: 07-21-1950 Today's Date: 07/06/2023   History of Present Illness Patient is a 73 year old female with progressive shortness of breath, admitted with acute on chronic respiratory failure. History of COPD, DM-2, neuropathy, HTN, chronic back pain, lumbar laminectomy, thoracic compression fracture hypothyroidism, memory loss, osteoporosis    PT Comments  Pt greeted supine in bed, pleasant and agreeable to PT session. She advanced OOB mobility by engaging in gait training. Pt ambulated using RW with CGA and a chair follow. She demonstrated a step-to gait pattern, NBOS, and slow gait speed. Pt was greeted on 6L HFNC and bumped up to 8L O2 during gait for safety as the pleth reading was poor and unreliable. Will continue to follow acutely and advance appropriately.     If plan is discharge home, recommend the following: Assist for transportation;Assistance with cooking/housework;A little help with walking and/or transfers;Help with stairs or ramp for entrance   Can travel by private vehicle        Equipment Recommendations  None recommended by PT    Recommendations for Other Services       Precautions / Restrictions Precautions Precautions: Fall Recall of Precautions/Restrictions: Intact Precaution/Restrictions Comments: watch SpO2 Restrictions Weight Bearing Restrictions Per Provider Order: No     Mobility  Bed Mobility Overal bed mobility: Needs Assistance Bed Mobility: Supine to Sit     Supine to sit: Supervision, HOB elevated, Used rails     General bed mobility comments: Pt sat up on L side of bed with increased time.    Transfers Overall transfer level: Needs assistance Equipment used: Rolling walker (2 wheels) Transfers: Sit to/from Stand Sit to Stand: Contact guard assist           General transfer comment: Pt stood from lowest bed height and recliner chair with CGA to power  up. She demonstrated proper hand placement using RW. Good eccentric control with sitting.    Ambulation/Gait Ambulation/Gait assistance: Contact guard assist, +2 safety/equipment (Chair Follow) Gait Distance (Feet): 60 Feet (1x10, seated rest, 1x60) Assistive device: Rolling walker (2 wheels) Gait Pattern/deviations: Narrow base of support, Trunk flexed, Step-to pattern, Decreased step length - right, Decreased step length - left Gait velocity: decreased Gait velocity interpretation: <1.31 ft/sec, indicative of household ambulator   General Gait Details: Pt ambulated with short slow steps and feet closed together. Cued pt to widen BOS to shoulder width apart and increase step length, which she was able to intermittently adapt to. Pt maintained fwd flex posture with RW slightly too far in front especially during turns. Pt tended to stay offset to the R in the RW.   Stairs             Wheelchair Mobility     Tilt Bed    Modified Rankin (Stroke Patients Only)       Balance Overall balance assessment: Needs assistance Sitting-balance support: Feet supported Sitting balance-Leahy Scale: Fair Sitting balance - Comments: Pt sat EOB with supervision and was able to scoot fwd/bkwd with BUE support.   Standing balance support: Bilateral upper extremity supported, During functional activity, Reliant on assistive device for balance Standing balance-Leahy Scale: Poor Standing balance comment: Pt dependent on RW                            Communication Communication Communication: Impaired Factors Affecting Communication: Hearing impaired  Cognition Arousal: Alert Behavior During Therapy:  WFL for tasks assessed/performed   PT - Cognitive impairments: No apparent impairments                         Following commands: Intact      Cueing Cueing Techniques: Verbal cues  Exercises      General Comments General comments (skin integrity, edema, etc.): Pt  greeted on 6L HFNC. Pleth reading was unreliable throughout session. Increased pt to 8L during gait, just in case as it was unclear if she was truly desaturating.      Pertinent Vitals/Pain Pain Assessment Pain Assessment: No/denies pain    Home Living                          Prior Function            PT Goals (current goals can now be found in the care plan section) Acute Rehab PT Goals Patient Stated Goal: Return Home Progress towards PT goals: Progressing toward goals    Frequency    Min 2X/week      PT Plan      Co-evaluation              AM-PAC PT "6 Clicks" Mobility   Outcome Measure  Help needed turning from your back to your side while in a flat bed without using bedrails?: A Little Help needed moving from lying on your back to sitting on the side of a flat bed without using bedrails?: A Little Help needed moving to and from a bed to a chair (including a wheelchair)?: A Little Help needed standing up from a chair using your arms (e.g., wheelchair or bedside chair)?: A Little Help needed to walk in hospital room?: Total Help needed climbing 3-5 steps with a railing? : Total 6 Click Score: 14    End of Session Equipment Utilized During Treatment: Gait belt;Oxygen Activity Tolerance: Patient tolerated treatment well Patient left: in chair;with call bell/phone within reach;with chair alarm set Nurse Communication: Mobility status;Other (comment) (Poor pleth reading) PT Visit Diagnosis: Muscle weakness (generalized) (M62.81);Unsteadiness on feet (R26.81)     Time: 1610-9604 PT Time Calculation (min) (ACUTE ONLY): 23 min  Charges:    $Gait Training: 8-22 mins $Therapeutic Activity: 8-22 mins PT General Charges $$ ACUTE PT VISIT: 1 Visit                     Glenford Lanes, PT, DPT Acute Rehabilitation Services Office: [phone] Secure Chat Preferred  Riva Chester 07/06/2023, 12:41 PM

## 2023-07-06 NOTE — Progress Notes (Signed)
 PROGRESS NOTE  Laura Dalton HKV:[id number] DOB: 09/03/1950   PCP: Dettinger, Lucio Sabin, MD  Patient is from: Home.  Lives alone.  Uses rolling walker at baseline.   DOA: 07/03/2023 LOS: 3  Chief complaints Chief Complaint  Patient presents with   Respiratory Distress     Brief Narrative / Interim history: 73 y.o. female with PMH of COPD, DM-2, with neuropathy, HTN, chronic back pain, lumbar laminectomy, thoracic compression fraction, hypothyroidism, memory loss and osteoporosis presenting with progressive shortness of breath, DOE, wheezing, weakness and diaphoresis, and admitted with acute respiratory failure with hypoxia and hypercapnia in the setting of COPD exacerbation.  Reportedly saturating at 70% on room air when EMS arrived.  Wheezing with work of breathing as well.  In ED, she was started on BiPAP.  She desaturated to 86% when weaned to 6 L.  She was put back on BiPAP.   The next day, patient remained on BiPAP.  CT angio chest negative for PE but large RLL airspace opacity with small associated right pleural effusion and mild opacity in RUL consistent with pneumonia.  BNP trended up to 1300.  IV fluid discontinued.  Started on ceftriaxone  and Lasix .  TTE with LVEF of 25% and RWMA pattern suggesting Takotsubo cardiomyopathy or severe CAD.  Cardiology consulted.  Diuretics discontinued.   Subjective: Seen and examined earlier this morning.  No major events overnight of this morning.  No complaints.  Denies shortness of breath at rest.  Denies chest pain.  Currently saturating in mid 90s on 6 L.   Objective: Vitals:   07/05/23 1934 07/06/23 0033 07/06/23 0444 07/06/23 0813  BP: 111/69 93/71 93/72  115/63  Pulse: (!) 111 96 98 (!) 109  Resp: 20 20 20  (!) 26  Temp: 99.2 F (37.3 C) 99.2 F (37.3 C) 99.2 F (37.3 C)   TempSrc: Oral Oral Oral   SpO2: 93% 100% 100% 100%  Weight:   69.9 kg   Height:        Examination:  GENERAL: No apparent distress.  Nontoxic. HEENT: MMM.   Vision and hearing grossly intact.  NECK: Supple.  No apparent JVD.  RESP: No IWOB.  Diminished aeration bilaterally. CVS:  RRR. Heart sounds normal.  ABD/GI/GU: BS+. Abd soft, NTND.  MSK/EXT:  Moves extremities. No apparent deformity. No edema.  SKIN: no apparent skin lesion or wound NEURO: Awake, alert and oriented appropriately.  No apparent focal neuro deficit. PSYCH: Calm. Normal affect.   Consultants:  None  Procedures: None  Microbiology summarized: COVID-19, influenza and RSV PCR nonreactive A 20 pathogen RVP nonreactive MRSA PCR screen negative.  Assessment and plan: Acute respiratory failure with hypoxia and hypercapnia: Due to COPD, pneumonia and acute CHF.Aaron Dalton  Her respiratory illness 2 weeks prior.  Reportedly saturating at 70% on RA when EMS arrived.  Desaturated to 86% when weaned off BiPAP to 6 L.  ABG 7.3/53/27.   COVID-19 and full RVP nonreactive.  BNP 18.6>>> 622> 1300.  CXR without infiltrate.  CT angio chest negative for PE but RLL and RUL infiltrate raising suspicion for aspiration pneumonia.  MRSA PCR screen negative.  TTE with acute CHF as below.  Improved.  Currently saturating in mid 90s on 6 L.  - Continue IV Solu-Medrol  and Zithromax  starting tomorrow.  - Scheduled DuoNeb every 6 hours with as needed every 2 hours - Ceftriaxone  2 g daily 4/23>>> - IV Flagyl  4/24>>  - Wean oxygen as able,  Incentive spirometry OOB and PT/OT, - BiPAP as needed -  Needs controller on discharge.  Acute combined CHF: TTE with LVEF of 25%, RWMA and possible and normal RV function.  Patient appears euvolemic.  BNP trended from 16-1300.  She only received minimal IV fluid on admission.  Briefly diuresed with IV Lasix  -Appreciate help by cardiology.  Discontinued IV Lasix  -Plan for heart catheterization once stable from respiratory standpoint -Strict intake and output, daily weight, renal functions and electrolyte  Right lung pneumonia/COPD exacerbation:  - Management as above    Hyperglycemia: A1c 5.3%.  Not on medication.  Hyperglycemia likely due to steroid Recent Labs  Lab 07/05/23 0629 07/05/23 1120 07/05/23 1550 07/05/23 2143 07/06/23 0605  GLUCAP 105* 180* 225* 154* 109*  -Continue SSI-moderate   Chronic back pain: Reports using ibuprofen  at home.  Denies using oxycodone  or Robaxin . -Tylenol  as needed   Mood disorder: Stable -Continue home meds after med rec   Hypothyroidism - Continue home Synthroid  after med rec   Essential hypertension - Continue home meds as appropriate   Hyponatremia: Mild - Monitor   Hypokalemia:  - Monitor replenish as appropriate  Hyperkalemia: Resolved.  Leukocytosis/bandemia: Likely due to pneumonia and steroid.  Started to improve. -Continue monitoring  Severe malnutrition Body mass index is 24.87 kg/m. Nutrition Problem: Severe Malnutrition Etiology: chronic illness Signs/Symptoms: energy intake < 75% for > or equal to 3 months, percent weight loss Percent weight loss: 13 % Interventions: Ensure Enlive (each supplement provides 350kcal and 20 grams of protein), MVI  Stage II sacral decubitus: Present on admission Pressure Injury 07/04/23 Sacrum Right Stage 2 -  Partial thickness loss of dermis presenting as a shallow open injury with a red, pink wound bed without slough. dry (Active)  07/04/23 1353  Location: Sacrum  Location Orientation: Right  Staging: Stage 2 -  Partial thickness loss of dermis presenting as a shallow open injury with a red, pink wound bed without slough.  Wound Description (Comments): dry  Present on Admission: Yes  Dressing Type Foam - Lift dressing to assess site every shift 07/06/23 0400   DVT prophylaxis:  enoxaparin  (LOVENOX ) injection 40 mg Start: 07/03/23 1600  Code Status: Full code Family Communication: Updated patient's son at bedside Level of care: Progressive Status is: Inpatient Remains inpatient appropriate because: Acute respiratory failure, COPD exacerbation  and acute CHF   Final disposition: Likely home once medically stable   55 minutes with more than 50% spent in reviewing records, counseling patient/family and coordinating care.   Sch Meds:  Scheduled Meds:  amitriptyline   25 mg Oral QHS   atorvastatin   40 mg Oral Daily   budesonide  (PULMICORT ) nebulizer solution  0.5 mg Nebulization BID   clopidogrel   75 mg Oral Daily   DULoxetine   60 mg Oral QHS   enoxaparin  (LOVENOX ) injection  40 mg Subcutaneous Q24H   feeding supplement  237 mL Oral BID BM   insulin  aspart  0-15 Units Subcutaneous TID WC   insulin  aspart  0-5 Units Subcutaneous QHS   ipratropium-albuterol   3 mL Nebulization BID   levothyroxine   125 mcg Oral QAC breakfast   methylPREDNISolone  (SOLU-MEDROL ) injection  80 mg Intravenous Daily   multivitamin with minerals  1 tablet Oral Daily   Continuous Infusions:  azithromycin  250 mg (07/05/23 1710)   cefTRIAXone  (ROCEPHIN )  IV 2 g (07/05/23 1531)   metronidazole  500 mg (07/06/23 0233)   PRN Meds:.acetaminophen  **OR** acetaminophen , ipratropium-albuterol , ondansetron  **OR** ondansetron  (ZOFRAN ) IV, mouth rinse  Antimicrobials: Anti-infectives (From admission, onward)    Start  Dose/Rate Route Frequency Ordered Stop   07/05/23 1330  metroNIDAZOLE  (FLAGYL ) IVPB 500 mg        500 mg 100 mL/hr over 60 Minutes Intravenous Every 12 hours 07/05/23 1233     07/04/23 1445  cefTRIAXone  (ROCEPHIN ) 2 g in sodium chloride  0.9 % 100 mL IVPB        2 g 200 mL/hr over 30 Minutes Intravenous Every 24 hours 07/04/23 1352 07/09/23 1444   07/04/23 1400  azithromycin  (ZITHROMAX ) 250 mg in dextrose  5 % 125 mL IVPB        250 mg 127.5 mL/hr over 60 Minutes Intravenous Every 24 hours 07/03/23 1603     07/03/23 1400  azithromycin  (ZITHROMAX ) 500 mg in sodium chloride  0.9 % 250 mL IVPB        500 mg 250 mL/hr over 60 Minutes Intravenous  Once 07/03/23 1349 07/03/23 1507   07/03/23 1400  cefTRIAXone  (ROCEPHIN ) 1 g in sodium chloride  0.9 %  100 mL IVPB        1 g 200 mL/hr over 30 Minutes Intravenous  Once 07/03/23 1349 07/03/23 1455        I have personally reviewed the following labs and images: CBC: Recent Labs  Lab 07/03/23 1318 07/03/23 1531 07/04/23 0600 07/05/23 0836 07/06/23 0349  WBC 20.7*  --  24.6* 28.9* 24.8*  NEUTROABS 16.1*  --   --   --  20.7*  HGB 13.4 13.6 12.2 13.0 12.6  HCT 42.8 40.0 38.8 39.6 38.7  MCV 86.8  --  88.2 84.3 85.1  PLT 448*  --  301 326 290   BMP &GFR Recent Labs  Lab 07/03/23 1318 07/03/23 1531 07/03/23 1726 07/04/23 0600 07/05/23 0836 07/06/23 0349  NA 134* 138  --  140 141 136  K 2.8* 2.6*  --  5.5* 3.2* 4.6  CL 95*  --   --  106 96* 99  CO2 22  --   --  24 27 27   GLUCOSE 354*  --   --  144* 149* 119*  BUN 5*  --   --  8 13 12   CREATININE 0.86  --   --  0.65 0.73 0.71  CALCIUM  9.6  --   --  9.0 9.7 8.8*  MG  --   --  2.2  --  1.9 1.9  PHOS  --   --   --   --  2.4* 2.3*   Estimated Creatinine Clearance: 58.6 mL/min (by C-G formula based on SCr of 0.71 mg/dL). Liver & Pancreas: Recent Labs  Lab 07/03/23 1318 07/05/23 0836 07/06/23 0349  AST 24  --   --   ALT 9  --   --   ALKPHOS 80  --   --   BILITOT 0.6  --   --   PROT 6.8  --   --   ALBUMIN 3.9 3.3* 3.1*   No results for input(s): "LIPASE", "AMYLASE" in the last 168 hours. No results for input(s): "AMMONIA" in the last 168 hours. Diabetic: Recent Labs    07/03/23 1716  HGBA1C 5.3   Recent Labs  Lab 07/05/23 0629 07/05/23 1120 07/05/23 1550 07/05/23 2143 07/06/23 0605  GLUCAP 105* 180* 225* 154* 109*   Cardiac Enzymes: No results for input(s): "CKTOTAL", "CKMB", "CKMBINDEX", "TROPONINI" in the last 168 hours. No results for input(s): "PROBNP" in the last 8760 hours. Coagulation Profile: No results for input(s): "INR", "PROTIME" in the last 168 hours. Thyroid  Function Tests: No results for  input(s): "TSH", "T4TOTAL", "FREET4", "T3FREE", "THYROIDAB" in the last 72 hours. Lipid Profile: No  results for input(s): "CHOL", "HDL", "LDLCALC", "TRIG", "CHOLHDL", "LDLDIRECT" in the last 72 hours. Anemia Panel: No results for input(s): "VITAMINB12", "FOLATE", "FERRITIN", "TIBC", "IRON", "RETICCTPCT" in the last 72 hours. Urine analysis:    Component Value Date/Time   COLORURINE YELLOW 12/11/2022 1742   APPEARANCEUR CLEAR 12/11/2022 1742   APPEARANCEUR Clear 07/28/2021 1634   LABSPEC 1.010 12/11/2022 1742   PHURINE 6.0 12/11/2022 1742   GLUCOSEU NEGATIVE 12/11/2022 1742   HGBUR SMALL (A) 12/11/2022 1742   BILIRUBINUR NEGATIVE 12/11/2022 1742   BILIRUBINUR Negative 07/28/2021 1634   KETONESUR NEGATIVE 12/11/2022 1742   PROTEINUR NEGATIVE 12/11/2022 1742   UROBILINOGEN negative 04/30/2015 1313   NITRITE NEGATIVE 12/11/2022 1742   LEUKOCYTESUR NEGATIVE 12/11/2022 1742   Sepsis Labs: Invalid input(s): "PROCALCITONIN", "LACTICIDVEN"  Microbiology: Recent Results (from the past 240 hours)  Resp panel by RT-PCR (RSV, Flu A&B, Covid) Anterior Nasal Swab     Status: None   Collection Time: 07/03/23  1:11 PM   Specimen: Anterior Nasal Swab  Result Value Ref Range Status   SARS Coronavirus 2 by RT PCR NEGATIVE NEGATIVE Final   Influenza A by PCR NEGATIVE NEGATIVE Final   Influenza B by PCR NEGATIVE NEGATIVE Final    Comment: (NOTE) The Xpert Xpress SARS-CoV-2/FLU/RSV plus assay is intended as an aid in the diagnosis of influenza from Nasopharyngeal swab specimens and should not be used as a sole basis for treatment. Nasal washings and aspirates are unacceptable for Xpert Xpress SARS-CoV-2/FLU/RSV testing.  Fact Sheet for Patients: BloggerCourse.com  Fact Sheet for Healthcare Providers: SeriousBroker.it  This test is not yet approved or cleared by the [place]  FDA and has been authorized for detection and/or diagnosis of SARS-CoV-2 by FDA under an Emergency Use Authorization (EUA). This EUA will remain in effect (meaning  this test can be used) for the duration of the COVID-19 declaration under Section 564(b)(1) of the Act, 21 U.S.C. section 360bbb-3(b)(1), unless the authorization is terminated or revoked.     Resp Syncytial Virus by PCR NEGATIVE NEGATIVE Final    Comment: (NOTE) Fact Sheet for Patients: BloggerCourse.com  Fact Sheet for Healthcare Providers: SeriousBroker.it  This test is not yet approved or cleared by the [place]  FDA and has been authorized for detection and/or diagnosis of SARS-CoV-2 by FDA under an Emergency Use Authorization (EUA). This EUA will remain in effect (meaning this test can be used) for the duration of the COVID-19 declaration under Section 564(b)(1) of the Act, 21 U.S.C. section 360bbb-3(b)(1), unless the authorization is terminated or revoked.  Performed at [facility], [address], [place], [place] [address]   Respiratory (~20 pathogens) panel by PCR     Status: None   Collection Time: 07/03/23  3:49 PM   Specimen: Nasopharyngeal Swab; Respiratory  Result Value Ref Range Status   Adenovirus NOT DETECTED NOT DETECTED Final   Coronavirus 229E NOT DETECTED NOT DETECTED Final    Comment: (NOTE) The Coronavirus on the Respiratory Panel, DOES NOT test for the novel  Coronavirus (2019 nCoV)    Coronavirus HKU1 NOT DETECTED NOT DETECTED Final   Coronavirus NL63 NOT DETECTED NOT DETECTED Final   Coronavirus OC43 NOT DETECTED NOT DETECTED Final   Metapneumovirus NOT DETECTED NOT DETECTED Final   Rhinovirus / Enterovirus NOT DETECTED NOT DETECTED Final   Influenza A NOT DETECTED NOT DETECTED Final   Influenza B NOT DETECTED NOT DETECTED Final  Parainfluenza Virus 1 NOT DETECTED NOT DETECTED Final   Parainfluenza Virus 2 NOT DETECTED NOT DETECTED Final   Parainfluenza Virus 3 NOT DETECTED NOT DETECTED Final   Parainfluenza Virus 4 NOT DETECTED NOT DETECTED Final   Respiratory Syncytial Virus NOT  DETECTED NOT DETECTED Final   Bordetella pertussis NOT DETECTED NOT DETECTED Final   Bordetella Parapertussis NOT DETECTED NOT DETECTED Final   Chlamydophila pneumoniae NOT DETECTED NOT DETECTED Final   Mycoplasma pneumoniae NOT DETECTED NOT DETECTED Final    Comment: Performed at [facility], [address] [place], [place] [address]  MRSA Next Gen by PCR, Nasal     Status: None   Collection Time: 07/04/23  1:57 PM   Specimen: Urine, Clean Catch; Nasal Swab  Result Value Ref Range Status   MRSA by PCR Next Gen NOT DETECTED NOT DETECTED Final    Comment: (NOTE) The GeneXpert MRSA Assay (FDA approved for NASAL specimens only), is one component of a comprehensive MRSA colonization surveillance program. It is not intended to diagnose MRSA infection nor to guide or monitor treatment for MRSA infections. Test performance is not FDA approved in patients less than 29 years old. Performed at [facility], [address] [place], [place] [address]     Radiology Studies: ECHOCARDIOGRAM COMPLETE Result Date: 07/05/2023    ECHOCARDIOGRAM REPORT   Patient Name:   Laura Dalton Goncalves Date of Exam: 07/05/2023 Medical Rec #:  [id number]         Height:       66.0 in Accession #:    [id number]        Weight:       141.5 lb Date of Birth:  27-Mar-1950          BSA:          1.727 m Patient Age:    72 years          BP:           101/64 mmHg Patient Gender: F                 HR:           114 bpm. Exam Location:  Inpatient Procedure: 2D Echo, Cardiac Doppler, Color Doppler and Intracardiac            Opacification Agent (Both Spectral and Color Flow Doppler were            utilized during procedure). Indications:    R00.9* Unspecified abnormalities of heart beat  History:        Patient has no prior history of Echocardiogram examinations.                 COPD, Signs/Symptoms:Shortness of Breath and Dyspnea; Risk                 Factors:Diabetes, Hypertension and Dyslipidemia.  Sonographer:    Raynelle Callow  RDCS Referring Phys: [id number] Ella Gun Tavonna Worthington IMPRESSIONS  1. Left ventricular ejection fraction, by estimation, is 25%. The left ventricle has severely decreased function. The left ventricle demonstrates regional wall motion abnormalities with normal function of the LV basal segments and akinesis of the LV mid  to apical segments. This suggests either a stress (Takotsubo-type) cardiomyopathy or severe coronary disease. There is mild concentric left ventricular hypertrophy. Left ventricular diastolic parameters are indeterminate.  2. Right ventricular systolic function is normal. The right ventricular size is normal. Tricuspid regurgitation signal is inadequate for assessing PA pressure.  3. The mitral valve is normal in structure. No evidence of mitral valve regurgitation. No evidence of mitral stenosis.  4. The aortic valve is tricuspid. There is mild calcification of the aortic valve. Aortic valve regurgitation is not visualized. No aortic stenosis is present.  5. The inferior vena cava is normal in size with greater than 50% respiratory variability, suggesting right atrial pressure of 3 mmHg.  6. A small pericardial effusion is present. FINDINGS  Left Ventricle: Left ventricular ejection fraction, by estimation, is 25%. The left ventricle has severely decreased function. The left ventricle demonstrates regional wall motion abnormalities. The left ventricular internal cavity size was normal in size. There is mild concentric left ventricular hypertrophy. Left ventricular diastolic parameters are indeterminate. Right Ventricle: The right ventricular size is normal. No increase in right ventricular wall thickness. Right ventricular systolic function is normal. Tricuspid regurgitation signal is inadequate for assessing PA pressure. Left Atrium: Left atrial size was normal in size. Right Atrium: Right atrial size was normal in size. Pericardium: A small pericardial effusion is present. Mitral Valve: The mitral valve is  normal in structure. No evidence of mitral valve regurgitation. No evidence of mitral valve stenosis. Tricuspid Valve: The tricuspid valve is normal in structure. Tricuspid valve regurgitation is not demonstrated. Aortic Valve: The aortic valve is tricuspid. There is mild calcification of the aortic valve. Aortic valve regurgitation is not visualized. No aortic stenosis is present. Pulmonic Valve: The pulmonic valve was normal in structure. Pulmonic valve regurgitation is not visualized. Aorta: The aortic root is normal in size and structure. Venous: The inferior vena cava is normal in size with greater than 50% respiratory variability, suggesting right atrial pressure of 3 mmHg. IAS/Shunts: No atrial level shunt detected by color flow Doppler.  LEFT VENTRICLE PLAX 2D LVIDd:         4.20 cm LVIDs:         2.80 cm LV PW:         1.50 cm LV IVS:        1.50 cm LVOT diam:     2.30 cm LV SV:         55 LV SV Index:   32 LVOT Area:     4.15 cm  LV Volumes (MOD) LV vol d, MOD A2C: 79.4 ml LV vol d, MOD A4C: 85.7 ml LV vol s, MOD A2C: 47.4 ml LV vol s, MOD A4C: 63.6 ml LV SV MOD A2C:     32.0 ml LV SV MOD A4C:     85.7 ml LV SV MOD BP:      30.2 ml RIGHT VENTRICLE             IVC RV S prime:     13.20 cm/s  IVC diam: 1.90 cm TAPSE (M-mode): 1.3 cm LEFT ATRIUM             Index        RIGHT ATRIUM          Index LA diam:        3.10 cm 1.80 cm/m   RA Area:     8.43 cm LA Vol (A2C):   26.9 ml 15.58 ml/m  RA Volume:   14.10 ml 8.17 ml/m LA Vol (A4C):   14.4 ml 8.34 ml/m LA Biplane Vol: 19.5 ml 11.29 ml/m  AORTIC VALVE LVOT Vmax:   91.50 cm/s LVOT Vmean:  62.600 cm/s LVOT VTI:    0.133 m  AORTA Ao Root diam: 2.90 cm Ao  Asc diam:  3.20 cm MITRAL VALVE MV Area (PHT): 5.97 cm     SHUNTS MV Decel Time: 127 msec     Systemic VTI:  0.13 m MV E velocity: 142.00 cm/s  Systemic Diam: 2.30 cm Dalton McleanMD Electronically signed by Archer Bear Signature Date/Time: 07/05/2023/2:42:25 PM    Final       Puja Caffey T.  Tiandra Swo[facility]  If 7PM-7AM, please contact night-coverage [email or web address] 07/06/2023, 11:02 AM

## 2023-07-07 DIAGNOSIS — E1142 Type 2 diabetes mellitus with diabetic polyneuropathy: Secondary | ICD-10-CM | POA: Diagnosis not present

## 2023-07-07 DIAGNOSIS — I5041 Acute combined systolic (congestive) and diastolic (congestive) heart failure: Secondary | ICD-10-CM | POA: Diagnosis not present

## 2023-07-07 DIAGNOSIS — J9601 Acute respiratory failure with hypoxia: Secondary | ICD-10-CM | POA: Diagnosis not present

## 2023-07-07 DIAGNOSIS — E039 Hypothyroidism, unspecified: Secondary | ICD-10-CM | POA: Diagnosis not present

## 2023-07-07 DIAGNOSIS — I10 Essential (primary) hypertension: Secondary | ICD-10-CM | POA: Diagnosis not present

## 2023-07-07 DIAGNOSIS — J441 Chronic obstructive pulmonary disease with (acute) exacerbation: Secondary | ICD-10-CM | POA: Diagnosis not present

## 2023-07-07 LAB — RENAL FUNCTION PANEL
Albumin: 3 g/dL — ABNORMAL LOW (ref 3.5–5.0)
Anion gap: 10 (ref 5–15)
BUN: 12 mg/dL (ref 8–23)
CO2: 24 mmol/L (ref 22–32)
Calcium: 8.8 mg/dL — ABNORMAL LOW (ref 8.9–10.3)
Chloride: 100 mmol/L (ref 98–111)
Creatinine, Ser: 0.58 mg/dL (ref 0.44–1.00)
GFR, Estimated: >60 mL/min (ref >60)
Glucose, Bld: 133 mg/dL — ABNORMAL HIGH (ref 70–99)
Phosphorus: 2.4 mg/dL — ABNORMAL LOW (ref 2.5–4.6)
Potassium: 4.2 mmol/L (ref 3.5–5.1)
Sodium: 134 mmol/L — ABNORMAL LOW (ref 135–145)

## 2023-07-07 LAB — GLUCOSE, CAPILLARY
Glucose-Capillary: 109 mg/dL — ABNORMAL HIGH (ref 70–99)
Glucose-Capillary: 149 mg/dL — ABNORMAL HIGH (ref 70–99)
Glucose-Capillary: 170 mg/dL — ABNORMAL HIGH (ref 70–99)
Glucose-Capillary: 171 mg/dL — ABNORMAL HIGH (ref 70–99)

## 2023-07-07 LAB — MAGNESIUM: Magnesium: 2.1 mg/dL (ref 1.7–2.4)

## 2023-07-07 LAB — CBC
HCT: 38 % (ref 36.0–46.0)
Hemoglobin: 12 g/dL (ref 12.0–15.0)
MCH: 26.8 pg (ref 26.0–34.0)
MCHC: 31.6 g/dL (ref 30.0–36.0)
MCV: 85 fL (ref 80.0–100.0)
Platelets: 283 10*3/uL (ref 150–400)
RBC: 4.47 MIL/uL (ref 3.87–5.11)
RDW: 13.2 % (ref 11.5–15.5)
WBC: 18.9 10*3/uL — ABNORMAL HIGH (ref 4.0–10.5)
nRBC: 0 % (ref 0.0–0.2)

## 2023-07-07 MED ORDER — METRONIDAZOLE 500 MG/100ML IV SOLN
500.0000 mg | Freq: Two times a day (BID) | INTRAVENOUS | Status: AC
Start: 2023-07-07 — End: 2023-07-09
  Administered 2023-07-07 – 2023-07-08 (×4): 500 mg via INTRAVENOUS
  Filled 2023-07-07 (×4): qty 100

## 2023-07-07 MED ORDER — PREDNISONE 20 MG PO TABS
50.0000 mg | ORAL_TABLET | Freq: Every day | ORAL | Status: DC
  Administered 2023-07-08 – 2023-07-09 (×2): 50 mg via ORAL
  Filled 2023-07-07 (×2): qty 1

## 2023-07-07 MED ORDER — DEXTROSE 5 % IV SOLN
250.0000 mg | INTRAVENOUS | Status: AC
  Administered 2023-07-07 – 2023-07-08 (×2): 250 mg via INTRAVENOUS
  Filled 2023-07-07 (×3): qty 2.5

## 2023-07-07 NOTE — Progress Notes (Signed)
 SATURATION QUALIFICATIONS: (This note is used to comply with regulatory documentation for home oxygen)  Patient Saturations on Room Air at Rest = 96%  Patient Saturations on Room Air while Ambulating = 95%  Patient Saturations on 0 Liters of oxygen while Ambulating = 96%  Please briefly explain why patient needs home oxygen: No oxygen needed while ambulating. Araceli Beady, RN

## 2023-07-07 NOTE — Progress Notes (Signed)
 PROGRESS NOTE  Laura Dalton ZOX:[id number] DOB: [birth year]   PCP: Dettinger, Lucio Sabin, MD  Patient is from: Home.  Lives alone.  Uses rolling walker at baseline.   DOA: 07/03/2023 LOS: 4  Chief complaints Chief Complaint  Patient presents with   Respiratory Distress     Brief Narrative / Interim history: 73 y.o. female with PMH of COPD, DM-2, with neuropathy, HTN, chronic back pain, lumbar laminectomy, thoracic compression fraction, hypothyroidism, memory loss and osteoporosis presenting with progressive shortness of breath, DOE, wheezing, weakness and diaphoresis, and admitted with acute respiratory failure with hypoxia and hypercapnia in the setting of COPD exacerbation.  Reportedly saturating at 70% on room air when EMS arrived.  Wheezing with work of breathing as well.  In ED, she was started on BiPAP.  She desaturated to 86% when weaned to 6 L.  She was put back on BiPAP.   The next day, patient remained on BiPAP.  CT angio chest negative for PE but large RLL airspace opacity with small associated right pleural effusion and mild opacity in RUL consistent with pneumonia.  BNP trended up to 1300.  IV fluid discontinued.  Started on ceftriaxone  and Lasix .  TTE with LVEF of 25% and RWMA pattern suggesting Takotsubo cardiomyopathy or severe CAD.  Cardiology consulted.  Diuretics discontinued.   Respiratory failure resolving.  Neurology following.  Subjective: Seen and examined earlier this morning.  No major events overnight of this morning.  No complaints.  Denies chest pain, shortness of breath or cough.  Currently saturating in upper 90s on 2 L at rest.  Objective: Vitals:   07/07/23 0154 07/07/23 0433 07/07/23 0730 07/07/23 0808  BP: 100/73 106/61 102/72   Pulse: 100 73 76   Resp: 20 20 19    Temp: 97.8 F (36.6 C) 98.1 F (36.7 C) (!) 97.5 F (36.4 C)   TempSrc: Oral Oral Axillary   SpO2: 98% 97% 93% 97%  Weight:  69.7 kg    Height:        Examination:  GENERAL: No  apparent distress.  Nontoxic. HEENT: MMM.  Vision and hearing grossly intact.  NECK: Supple.  No apparent JVD.  RESP: No IWOB.  Diminished aeration bilaterally. CVS:  RRR. Heart sounds normal.  ABD/GI/GU: BS+. Abd soft, NTND.  MSK/EXT:  Moves extremities. No apparent deformity. No edema.  SKIN: no apparent skin lesion or wound NEURO: Awake, alert and oriented appropriately.  No apparent focal neuro deficit. PSYCH: Calm. Normal affect.   Consultants:  None  Procedures: None  Microbiology summarized: COVID-19, influenza and RSV PCR nonreactive A 20 pathogen RVP nonreactive MRSA PCR screen negative.  Assessment and plan: Acute respiratory failure with hypoxia and hypercapnia: Due to COPD, pneumonia and acute CHF.[name]  Her respiratory illness 2 weeks prior.  Reportedly saturating at 70% on RA when EMS arrived.  Desaturated to 86% when weaned off BiPAP to 6 L.  ABG 7.3/53/27.   COVID-19 and full RVP nonreactive.  BNP 18.6>>> 622> 1300.  CXR without infiltrate.  CT angio chest negative for PE but RLL and RUL infiltrate raising suspicion for aspiration pneumonia.  MRSA PCR screen negative.  TTE with acute CHF as below.  Respiratory failure resolving.  Currently saturating in upper 90s on 2 L at rest.  Leukocytosis improving. -Ceftriaxone  and Zithromax  4/22 >> -Flagyl  4/24>>> -Continue DuoNebs scheduled and as needed -Transition from Solu-Medrol  to prednisone  -Wean oxygen as able, IS, OOB, ambulatory saturation and PT/OT, -Needs controller on discharge.  Acute combined CHF: TTE  with LVEF of 25%, RWMA and possible and normal RV function.  Patient appears euvolemic.  BNP trended from 16-1300.  She only received minimal IV fluid on admission.  Briefly diuresed with IV Lasix  -Appreciate help by cardiology.  Discontinued IV Lasix  -Plan for heart catheterization once stable from respiratory standpoint -Strict intake and output, daily weight, renal functions and electrolyte  Right lung  pneumonia/COPD exacerbation:  - Management as above   Hyperglycemia: A1c 5.3%.  Not on medication.  Hyperglycemia likely due to steroid Recent Labs  Lab 07/06/23 0605 07/06/23 1110 07/06/23 1636 07/06/23 2059 07/07/23 0604  GLUCAP 109* 96 205* 151* 109*  -Continue SSI-moderate   Chronic back pain: Reports using ibuprofen  at home.  Denies using oxycodone  or Robaxin . -Tylenol  as needed   Mood disorder: Stable -Continue home meds after med rec   Hypothyroidism - Continue home Synthroid  after med rec   Essential hypertension - Continue home meds as appropriate   Hyponatremia: Mild - Monitor   Hypokalemia:  - Monitor replenish as appropriate  Hyperkalemia: Resolved.  Leukocytosis/bandemia: Likely due to pneumonia and steroid.  Improving. -Continue monitoring  Severe malnutrition Body mass index is 24.8 kg/m. Nutrition Problem: Severe Malnutrition Etiology: chronic illness Signs/Symptoms: energy intake < 75% for > or equal to 3 months, percent weight loss Percent weight loss: 13 % Interventions: Ensure Enlive (each supplement provides 350kcal and 20 grams of protein), MVI  Stage II sacral decubitus: Present on admission Pressure Injury 07/04/23 Sacrum Right Stage 2 -  Partial thickness loss of dermis presenting as a shallow open injury with a red, pink wound bed without slough. dry (Active)  07/04/23 1353  Location: Sacrum  Location Orientation: Right  Staging: Stage 2 -  Partial thickness loss of dermis presenting as a shallow open injury with a red, pink wound bed without slough.  Wound Description (Comments): dry  Present on Admission: Yes  Dressing Type Foam - Lift dressing to assess site every shift 07/07/23 0730   DVT prophylaxis:  enoxaparin  (LOVENOX ) injection 40 mg Start: 07/03/23 1600  Code Status: Full code Family Communication: None at bedside. Level of care: Progressive Status is: Inpatient Remains inpatient appropriate because: Acute respiratory  failure, COPD exacerbation and acute CHF   Final disposition: Likely home once medically stable   55 minutes with more than 50% spent in reviewing records, counseling patient/family and coordinating care.   Sch Meds:  Scheduled Meds:  amitriptyline   25 mg Oral QHS   atorvastatin   40 mg Oral Daily   budesonide  (PULMICORT ) nebulizer solution  0.5 mg Nebulization BID   clopidogrel   75 mg Oral Daily   DULoxetine   60 mg Oral QHS   enoxaparin  (LOVENOX ) injection  40 mg Subcutaneous Q24H   feeding supplement  237 mL Oral BID BM   insulin  aspart  0-15 Units Subcutaneous TID WC   insulin  aspart  0-5 Units Subcutaneous QHS   ipratropium-albuterol   3 mL Nebulization BID   levothyroxine   125 mcg Oral QAC breakfast   multivitamin with minerals  1 tablet Oral Daily   [START ON 07/08/2023] predniSONE   50 mg Oral Q breakfast   Continuous Infusions:  azithromycin      cefTRIAXone  (ROCEPHIN )  IV 2 g (07/06/23 1507)   metronidazole      PRN Meds:.acetaminophen  **OR** acetaminophen , ipratropium-albuterol , ondansetron  **OR** ondansetron  (ZOFRAN ) IV, mouth rinse  Antimicrobials: Anti-infectives (From admission, onward)    Start     Dose/Rate Route Frequency Ordered Stop   07/07/23 1700  azithromycin  (ZITHROMAX ) 250 mg in  dextrose  5 % 125 mL IVPB        250 mg 127.5 mL/hr over 60 Minutes Intravenous Every 24 hours 07/07/23 0735 07/09/23 1659   07/07/23 1200  metroNIDAZOLE  (FLAGYL ) IVPB 500 mg        500 mg 100 mL/hr over 60 Minutes Intravenous Every 12 hours 07/07/23 0735 07/09/23 1159   07/05/23 1330  metroNIDAZOLE  (FLAGYL ) IVPB 500 mg  Status:  Discontinued        500 mg 100 mL/hr over 60 Minutes Intravenous Every 12 hours 07/05/23 1233 07/07/23 0735   07/04/23 1445  cefTRIAXone  (ROCEPHIN ) 2 g in sodium chloride  0.9 % 100 mL IVPB        2 g 200 mL/hr over 30 Minutes Intravenous Every 24 hours 07/04/23 1352 07/09/23 1444   07/04/23 1400  azithromycin  (ZITHROMAX ) 250 mg in dextrose  5 % 125 mL  IVPB  Status:  Discontinued        250 mg 127.5 mL/hr over 60 Minutes Intravenous Every 24 hours 07/03/23 1603 07/07/23 0735   07/03/23 1400  azithromycin  (ZITHROMAX ) 500 mg in sodium chloride  0.9 % 250 mL IVPB        500 mg 250 mL/hr over 60 Minutes Intravenous  Once 07/03/23 1349 07/03/23 1507   07/03/23 1400  cefTRIAXone  (ROCEPHIN ) 1 g in sodium chloride  0.9 % 100 mL IVPB        1 g 200 mL/hr over 30 Minutes Intravenous  Once 07/03/23 1349 07/03/23 1455        I have personally reviewed the following labs and images: CBC: Recent Labs  Lab 07/03/23 1318 07/03/23 1531 07/04/23 0600 07/05/23 0836 07/06/23 0349 07/07/23 0332  WBC 20.7*  --  24.6* 28.9* 24.8* 18.9*  NEUTROABS 16.1*  --   --   --  20.7*  --   HGB 13.4 13.6 12.2 13.0 12.6 12.0  HCT 42.8 40.0 38.8 39.6 38.7 38.0  MCV 86.8  --  88.2 84.3 85.1 85.0  PLT 448*  --  301 326 290 283   BMP &GFR Recent Labs  Lab 07/03/23 1318 07/03/23 1531 07/03/23 1726 07/04/23 0600 07/05/23 0836 07/06/23 0349 07/07/23 0332  NA 134* 138  --  140 141 136 134*  K 2.8* 2.6*  --  5.5* 3.2* 4.6 4.2  CL 95*  --   --  106 96* 99 100  CO2 22  --   --  24 27 27 24   GLUCOSE 354*  --   --  144* 149* 119* 133*  BUN 5*  --   --  8 13 12 12   CREATININE 0.86  --   --  0.65 0.73 0.71 0.58  CALCIUM  9.6  --   --  9.0 9.7 8.8* 8.8*  MG  --   --  2.2  --  1.9 1.9 2.1  PHOS  --   --   --   --  2.4* 2.3* 2.4*   Estimated Creatinine Clearance: 58.6 mL/min (by C-G formula based on SCr of 0.58 mg/dL). Liver & Pancreas: Recent Labs  Lab 07/03/23 1318 07/05/23 0836 07/06/23 0349 07/07/23 0332  AST 24  --   --   --   ALT 9  --   --   --   ALKPHOS 80  --   --   --   BILITOT 0.6  --   --   --   PROT 6.8  --   --   --   ALBUMIN 3.9 3.3* 3.1* 3.0*  No results for input(s): "LIPASE", "AMYLASE" in the last 168 hours. No results for input(s): "AMMONIA" in the last 168 hours. Diabetic: No results for input(s): "HGBA1C" in the last 72  hours.  Recent Labs  Lab 07/06/23 0605 07/06/23 1110 07/06/23 1636 07/06/23 2059 07/07/23 0604  GLUCAP 109* 96 205* 151* 109*   Cardiac Enzymes: No results for input(s): "CKTOTAL", "CKMB", "CKMBINDEX", "TROPONINI" in the last 168 hours. No results for input(s): "PROBNP" in the last 8760 hours. Coagulation Profile: No results for input(s): "INR", "PROTIME" in the last 168 hours. Thyroid  Function Tests: No results for input(s): "TSH", "T4TOTAL", "FREET4", "T3FREE", "THYROIDAB" in the last 72 hours. Lipid Profile: No results for input(s): "CHOL", "HDL", "LDLCALC", "TRIG", "CHOLHDL", "LDLDIRECT" in the last 72 hours. Anemia Panel: No results for input(s): "VITAMINB12", "FOLATE", "FERRITIN", "TIBC", "IRON", "RETICCTPCT" in the last 72 hours. Urine analysis:    Component Value Date/Time   COLORURINE YELLOW 12/11/2022 1742   APPEARANCEUR CLEAR 12/11/2022 1742   APPEARANCEUR Clear 07/28/2021 1634   LABSPEC 1.010 12/11/2022 1742   PHURINE 6.0 12/11/2022 1742   GLUCOSEU NEGATIVE 12/11/2022 1742   HGBUR SMALL (A) 12/11/2022 1742   BILIRUBINUR NEGATIVE 12/11/2022 1742   BILIRUBINUR Negative 07/28/2021 1634   KETONESUR NEGATIVE 12/11/2022 1742   PROTEINUR NEGATIVE 12/11/2022 1742   UROBILINOGEN negative 04/30/2015 1313   NITRITE NEGATIVE 12/11/2022 1742   LEUKOCYTESUR NEGATIVE 12/11/2022 1742   Sepsis Labs: Invalid input(s): "PROCALCITONIN", "LACTICIDVEN"  Microbiology: Recent Results (from the past 240 hours)  Resp panel by RT-PCR (RSV, Flu A&B, Covid) Anterior Nasal Swab     Status: None   Collection Time: 07/03/23  1:11 PM   Specimen: Anterior Nasal Swab  Result Value Ref Range Status   SARS Coronavirus 2 by RT PCR NEGATIVE NEGATIVE Final   Influenza A by PCR NEGATIVE NEGATIVE Final   Influenza B by PCR NEGATIVE NEGATIVE Final    Comment: (NOTE) The Xpert Xpress SARS-CoV-2/FLU/RSV plus assay is intended as an aid in the diagnosis of influenza from Nasopharyngeal swab  specimens and should not be used as a sole basis for treatment. Nasal washings and aspirates are unacceptable for Xpert Xpress SARS-CoV-2/FLU/RSV testing.  Fact Sheet for Patients: BloggerCourse.com  Fact Sheet for Healthcare Providers: SeriousBroker.it  This test is not yet approved or cleared by the [place]  FDA and has been authorized for detection and/or diagnosis of SARS-CoV-2 by FDA under an Emergency Use Authorization (EUA). This EUA will remain in effect (meaning this test can be used) for the duration of the COVID-19 declaration under Section 564(b)(1) of the Act, 21 U.S.C. section 360bbb-3(b)(1), unless the authorization is terminated or revoked.     Resp Syncytial Virus by PCR NEGATIVE NEGATIVE Final    Comment: (NOTE) Fact Sheet for Patients: BloggerCourse.com  Fact Sheet for Healthcare Providers: SeriousBroker.it  This test is not yet approved or cleared by the [place]  FDA and has been authorized for detection and/or diagnosis of SARS-CoV-2 by FDA under an Emergency Use Authorization (EUA). This EUA will remain in effect (meaning this test can be used) for the duration of the COVID-19 declaration under Section 564(b)(1) of the Act, 21 U.S.C. section 360bbb-3(b)(1), unless the authorization is terminated or revoked.  Performed at [facility], [address] [place], [place] [address]   Respiratory (~20 pathogens) panel by PCR     Status: None   Collection Time: 07/03/23  3:49 PM   Specimen: Nasopharyngeal Swab; Respiratory  Result Value Ref Range Status   Adenovirus  NOT DETECTED NOT DETECTED Final   Coronavirus 229E NOT DETECTED NOT DETECTED Final    Comment: (NOTE) The Coronavirus on the Respiratory Panel, DOES NOT test for the novel  Coronavirus (2019 nCoV)    Coronavirus HKU1 NOT DETECTED NOT DETECTED Final   Coronavirus NL63 NOT  DETECTED NOT DETECTED Final   Coronavirus OC43 NOT DETECTED NOT DETECTED Final   Metapneumovirus NOT DETECTED NOT DETECTED Final   Rhinovirus / Enterovirus NOT DETECTED NOT DETECTED Final   Influenza A NOT DETECTED NOT DETECTED Final   Influenza B NOT DETECTED NOT DETECTED Final   Parainfluenza Virus 1 NOT DETECTED NOT DETECTED Final   Parainfluenza Virus 2 NOT DETECTED NOT DETECTED Final   Parainfluenza Virus 3 NOT DETECTED NOT DETECTED Final   Parainfluenza Virus 4 NOT DETECTED NOT DETECTED Final   Respiratory Syncytial Virus NOT DETECTED NOT DETECTED Final   Bordetella pertussis NOT DETECTED NOT DETECTED Final   Bordetella Parapertussis NOT DETECTED NOT DETECTED Final   Chlamydophila pneumoniae NOT DETECTED NOT DETECTED Final   Mycoplasma pneumoniae NOT DETECTED NOT DETECTED Final    Comment: Performed at [facility], [address], [place], [place] [address]  MRSA Next Gen by PCR, Nasal     Status: None   Collection Time: 07/04/23  1:57 PM   Specimen: Urine, Clean Catch; Nasal Swab  Result Value Ref Range Status   MRSA by PCR Next Gen NOT DETECTED NOT DETECTED Final    Comment: (NOTE) The GeneXpert MRSA Assay (FDA approved for NASAL specimens only), is one component of a comprehensive MRSA colonization surveillance program. It is not intended to diagnose MRSA infection nor to guide or monitor treatment for MRSA infections. Test performance is not FDA approved in patients less than 44 years old. Performed at [facility], [address], [place], [place] [address]     Radiology Studies: No results found.     Alaila Pillard T. Kiyoto Slomski [facility]  If 7PM-7AM, please contact night-coverage [email or web address] 07/07/2023, 11:17 AM

## 2023-07-07 NOTE — Progress Notes (Signed)
 Rounding Note    Patient Name: Laura Dalton Date of Encounter: 07/07/2023  [facility] Cardiologist: Armida Lander, MD   Subjective   Denies chest pain Overall euvolemic. No longer on nasal cannula oxygen No family at bedside  Inpatient Medications    Scheduled Meds:  amitriptyline   25 mg Oral QHS   atorvastatin   40 mg Oral Daily   budesonide  (PULMICORT ) nebulizer solution  0.5 mg Nebulization BID   clopidogrel   75 mg Oral Daily   DULoxetine   60 mg Oral QHS   enoxaparin  (LOVENOX ) injection  40 mg Subcutaneous Q24H   feeding supplement  237 mL Oral BID BM   insulin  aspart  0-15 Units Subcutaneous TID WC   insulin  aspart  0-5 Units Subcutaneous QHS   ipratropium-albuterol   3 mL Nebulization BID   levothyroxine   125 mcg Oral QAC breakfast   multivitamin with minerals  1 tablet Oral Daily   [START ON 07/08/2023] predniSONE   50 mg Oral Q breakfast   Continuous Infusions:  azithromycin      cefTRIAXone  (ROCEPHIN )  IV 2 g (07/06/23 1507)   metronidazole  500 mg (07/07/23 1249)   PRN Meds: acetaminophen  **OR** acetaminophen , ipratropium-albuterol , ondansetron  **OR** ondansetron  (ZOFRAN ) IV, mouth rinse   Vital Signs    Vitals:   07/07/23 0433 07/07/23 0730 07/07/23 0808 07/07/23 1141  BP: 106/61 102/72  102/63  Pulse: 73 76    Resp: 20 19    Temp: 98.1 F (36.7 C) (!) 97.5 F (36.4 C)  98.8 F (37.1 C)  TempSrc: Oral Axillary  Oral  SpO2: 97% 93% 97%   Weight: 69.7 kg     Height:        Intake/Output Summary (Last 24 hours) at 07/07/2023 1347 Last data filed at 07/07/2023 0100 Gross per 24 hour  Intake 730.78 ml  Output 1150 ml  Net -419.22 ml      07/07/2023    4:33 AM 07/06/2023    4:44 AM 07/05/2023    5:31 AM  Last 3 Weights  Weight (lbs) 153 lb 10.6 oz 154 lb 1.6 oz 141 lb 8.6 oz  Weight (kg) 69.7 kg 69.9 kg 64.2 kg      Telemetry    NSR - Personally Reviewed  ECG    No new ECG - Personally Reviewed  Physical Exam   GEN:  No acute distress.   Neck: No JVD Cardiac: RRR, no murmurs, rubs, or gallops.  Respiratory: Diminished breath sounds on R GI: Soft, nontender, non-distended  MS: No edema; No deformity. Neuro:  Nonfocal  Psych: Normal affect   Labs    High Sensitivity Troponin:  No results for input(s): "TROPONINIHS" in the last 720 hours.   Chemistry Recent Labs  Lab 07/03/23 1318 07/03/23 1531 07/05/23 0836 07/06/23 0349 07/07/23 0332  NA 134*   < > 141 136 134*  K 2.8*   < > 3.2* 4.6 4.2  CL 95*   < > 96* 99 100  CO2 22   < > 27 27 24   GLUCOSE 354*   < > 149* 119* 133*  BUN 5*   < > 13 12 12   CREATININE 0.86   < > 0.73 0.71 0.58  CALCIUM  9.6   < > 9.7 8.8* 8.8*  MG  --    < > 1.9 1.9 2.1  PROT 6.8  --   --   --   --   ALBUMIN 3.9  --  3.3* 3.1* 3.0*  AST 24  --   --   --   --  ALT 9  --   --   --   --   ALKPHOS 80  --   --   --   --   BILITOT 0.6  --   --   --   --   GFRNONAA >60   < > >60 >60 >60  ANIONGAP 17*   < > 18* 10 10   < > = values in this interval not displayed.    Lipids No results for input(s): "CHOL", "TRIG", "HDL", "LABVLDL", "LDLCALC", "CHOLHDL" in the last 168 hours.  Hematology Recent Labs  Lab 07/05/23 0836 07/06/23 0349 07/07/23 0332  WBC 28.9* 24.8* 18.9*  RBC 4.70 4.55 4.47  HGB 13.0 12.6 12.0  HCT 39.6 38.7 38.0  MCV 84.3 85.1 85.0  MCH 27.7 27.7 26.8  MCHC 32.8 32.6 31.6  RDW 13.2 13.4 13.2  PLT 326 290 283   Thyroid  No results for input(s): "TSH", "FREET4" in the last 168 hours.  BNP Recent Labs  Lab 07/03/23 1311 07/04/23 0747 07/04/23 1322  BNP 18.6 622.1* 1,348.5*    DDimer No results for input(s): "DDIMER" in the last 168 hours.   Radiology    ECHOCARDIOGRAM COMPLETE Result Date: 07/05/2023    ECHOCARDIOGRAM REPORT   Patient Name:   Laura Dalton Date of Exam: 07/05/2023 Medical Rec #:  [id number]         Height:       66.0 in Accession #:    [id number]        Weight:       141.5 lb Date of Birth:  Apr 13, 1950          BSA:           1.727 m Patient Age:    72 years          BP:           101/64 mmHg Patient Gender: F                 HR:           114 bpm. Exam Location:  Inpatient Procedure: 2D Echo, Cardiac Doppler, Color Doppler and Intracardiac            Opacification Agent (Both Spectral and Color Flow Doppler were            utilized during procedure). Indications:    R00.9* Unspecified abnormalities of heart beat  History:        Patient has no prior history of Echocardiogram examinations.                 COPD, Signs/Symptoms:Shortness of Breath and Dyspnea; Risk                 Factors:Diabetes, Hypertension and Dyslipidemia.  Sonographer:    Raynelle Callow RDCS Referring Phys: [id number] Ella Gun GONFA IMPRESSIONS  1. Left ventricular ejection fraction, by estimation, is 25%. The left ventricle has severely decreased function. The left ventricle demonstrates regional wall motion abnormalities with normal function of the LV basal segments and akinesis of the LV mid  to apical segments. This suggests either a stress (Takotsubo-type) cardiomyopathy or severe coronary disease. There is mild concentric left ventricular hypertrophy. Left ventricular diastolic parameters are indeterminate.  2. Right ventricular systolic function is normal. The right ventricular size is normal. Tricuspid regurgitation signal is inadequate for assessing PA pressure.  3. The mitral valve is normal in structure. No evidence of mitral valve regurgitation. No evidence of mitral stenosis.  4. The aortic valve is tricuspid. There is mild calcification of the aortic valve. Aortic valve regurgitation is not visualized. No aortic stenosis is present.  5. The inferior vena cava is normal in size with greater than 50% respiratory variability, suggesting right atrial pressure of 3 mmHg.  6. A small pericardial effusion is present. FINDINGS  Left Ventricle: Left ventricular ejection fraction, by estimation, is 25%. The left ventricle has severely decreased function. The left  ventricle demonstrates regional wall motion abnormalities. The left ventricular internal cavity size was normal in size. There is mild concentric left ventricular hypertrophy. Left ventricular diastolic parameters are indeterminate. Right Ventricle: The right ventricular size is normal. No increase in right ventricular wall thickness. Right ventricular systolic function is normal. Tricuspid regurgitation signal is inadequate for assessing PA pressure. Left Atrium: Left atrial size was normal in size. Right Atrium: Right atrial size was normal in size. Pericardium: A small pericardial effusion is present. Mitral Valve: The mitral valve is normal in structure. No evidence of mitral valve regurgitation. No evidence of mitral valve stenosis. Tricuspid Valve: The tricuspid valve is normal in structure. Tricuspid valve regurgitation is not demonstrated. Aortic Valve: The aortic valve is tricuspid. There is mild calcification of the aortic valve. Aortic valve regurgitation is not visualized. No aortic stenosis is present. Pulmonic Valve: The pulmonic valve was normal in structure. Pulmonic valve regurgitation is not visualized. Aorta: The aortic root is normal in size and structure. Venous: The inferior vena cava is normal in size with greater than 50% respiratory variability, suggesting right atrial pressure of 3 mmHg. IAS/Shunts: No atrial level shunt detected by color flow Doppler.  LEFT VENTRICLE PLAX 2D LVIDd:         4.20 cm LVIDs:         2.80 cm LV PW:         1.50 cm LV IVS:        1.50 cm LVOT diam:     2.30 cm LV SV:         55 LV SV Index:   32 LVOT Area:     4.15 cm  LV Volumes (MOD) LV vol d, MOD A2C: 79.4 ml LV vol d, MOD A4C: 85.7 ml LV vol s, MOD A2C: 47.4 ml LV vol s, MOD A4C: 63.6 ml LV SV MOD A2C:     32.0 ml LV SV MOD A4C:     85.7 ml LV SV MOD BP:      30.2 ml RIGHT VENTRICLE             IVC RV S prime:     13.20 cm/s  IVC diam: 1.90 cm TAPSE (M-mode): 1.3 cm LEFT ATRIUM             Index         RIGHT ATRIUM          Index LA diam:        3.10 cm 1.80 cm/m   RA Area:     8.43 cm LA Vol (A2C):   26.9 ml 15.58 ml/m  RA Volume:   14.10 ml 8.17 ml/m LA Vol (A4C):   14.4 ml 8.34 ml/m LA Biplane Vol: 19.5 ml 11.29 ml/m  AORTIC VALVE LVOT Vmax:   91.50 cm/s LVOT Vmean:  62.600 cm/s LVOT VTI:    0.133 m  AORTA Ao Root diam: 2.90 cm Ao Asc diam:  3.20 cm MITRAL VALVE MV Area (PHT): 5.97 cm     SHUNTS MV Decel  Time: 127 msec     Systemic VTI:  0.13 m MV E velocity: 142.00 cm/s  Systemic Diam: 2.30 cm Dalton McleanMD Electronically signed by Archer Bear Signature Date/Time: 07/05/2023/2:42:25 PM    Final     Cardiac Studies     Patient Profile     73 y.o. female with a hx of COPD, T2DM, hypothyroidism  who is being seen for the evaluation of acute systolic heart failure  Assessment & Plan    Acute combined heart failure: Echocardiogram 4/24 showed EF 25% with apical akinesis suggesting either Takotsubo cardiomyopathy in setting of her pneumonia versus LAD territory infarct. - My former partner who saw her on rounds last week recommended undergoing ischemic workup prior to discharge.  Ideally recommending left and right heart catheterization given the severely reduced LVEF.    - Heart catheterization has been held off due to her underlying respiratory status.  She has been requiring 6 L of nasal cannula oxygen.  She is no longer on nasal cannula oxygen as of now.   Able to lay flat and resting comfortably   - Recommending proceeding forward with the original left and right heart catheterization on Monday, July 09, 2023.  Will discuss with patient and family tomorrow.    - Up titration of GDMT as limited due to soft blood pressures.  Avoid developing acute kidney injury will hold off with up titration of medical therapy after the heart catheterization is complete   Acute hypoxic respiratory failure: Due to pneumonia.  Remains on IV antibiotics.  Oxygen requirements have come down.  Also  being treated for COPD exacerbation, currently on IV Solu-Medrol  and scheduled DuoNebs  Hypertension: Soft blood pressures.  Uptitrate GDMT status post heart cath   For questions or updates, please contact [facility] Please consult [email or web address] for contact info under    Signed, Henderson Frampton, DO  07/07/2023, 1:47 PM

## 2023-07-07 NOTE — Progress Notes (Signed)
 Occupational Therapy Treatment Patient Details Name: Laura Dalton MRN: [id number] DOB: 1950/08/01 Today's Date: 07/07/2023   History of present illness Patient is a 73 year old female with progressive shortness of breath, admitted with acute on chronic respiratory failure. History of COPD, DM-2, neuropathy, HTN, chronic back pain, lumbar laminectomy, thoracic compression fracture hypothyroidism, memory loss, osteoporosis   OT comments  OT session focused on training in techniques for increased safety and independence with LB ADLs and bed mobility and functional mobility/transfers during/in preparation for functional tasks. Pt currently demonstrates ability to complete LB ADLs with Set up/Supervision to Contact guard assist, bed mobility with Supervision, and functional transfers/mobility with a RW with Contact guard assist ance cues for safety. Pt also demonstrating ability to complete grooming tasks in standing at sink with Contact guard assist. Pt participated well in session and is making progress toward goals. Pt on RA upon arrival with O2 sat at 100% and HR in the upper 90s. During ambulation and funcitonal mobility pt HR increased to mid-120s to upper-130s. Pt O2 sat largely >/93% on RA with one brief desat to 89% during functional mobility with O2 sat quickly rising to 98% in standing rest break on RA. At end of session, pt HR in the 90s and O2 sat 99% on RA with pt resting in bed. Pt will benefit from continued acute skilled OT services to address deficits outlined below and increase safety and independence with functional tasks. Post acute discharge, pt will benefit from [facility] OT to maximize rehab potential, decrease risk of falls, and decrease risk of rehospitalization.       If plan is discharge home, recommend the following:  A little help with walking and/or transfers;A little help with bathing/dressing/bathroom;Assistance with cooking/housework;Assist for transportation;Help with stairs or  ramp for entrance   Equipment Recommendations  BSC/3in1    Recommendations for Other Services      Precautions / Restrictions Precautions Precautions: Fall Recall of Precautions/Restrictions: Intact Precaution/Restrictions Comments: watch SpO2 and HR Restrictions Weight Bearing Restrictions Per Provider Order: No       Mobility Bed Mobility Overal bed mobility: Needs Assistance Bed Mobility: Supine to Sit, Sit to Supine     Supine to sit: Supervision, HOB elevated, Used rails Sit to supine: Supervision, HOB elevated   General bed mobility comments: Supervision for safety with increased time    Transfers Overall transfer level: Needs assistance Equipment used: Rolling walker (2 wheels) Transfers: Sit to/from Stand, Bed to chair/wheelchair/BSC Sit to Stand: Contact guard assist     Step pivot transfers: Contact guard assist     General transfer comment: CGA and cues for safety and staying close to RW     Balance Overall balance assessment: Needs assistance Sitting-balance support: No upper extremity supported, Feet supported Sitting balance-Leahy Scale: Good     Standing balance support: Single extremity supported, Bilateral upper extremity supported, During functional activity, Reliant on assistive device for balance Standing balance-Leahy Scale: Poor Standing balance comment: Pt requires UE support to maintain balance in standing during functional tasks                           ADL either performed or assessed with clinical judgement   ADL Overall ADL's : Needs assistance/impaired Eating/Feeding: Independent;Sitting   Grooming: Wash/dry hands;Oral care;Contact guard assist;Standing       Lower Body Bathing: Set up;Sitting/lateral leans;Contact guard assist;Sit to/from stand;Cueing for safety Lower Body Bathing Details (indicate cue type and reason):  Following episode of urinary incontinance when ambulating to bathroom.     Lower Body  Dressing: Supervision/safety;Set up;Sitting/lateral leans;Contact guard assist;Sit to/from stand;Cueing for safety   Toilet Transfer: Contact guard assist;Cueing for safety;Ambulation;Regular Toilet;Rolling walker (2 wheels);Grab bars   Toileting- Clothing Manipulation and Hygiene: Set up;Sitting/lateral lean;Contact guard assist;Sit to/from stand       Functional mobility during ADLs: Contact guard assist;Rolling walker (2 wheels);Cueing for safety General ADL Comments: Pt requiring cues for safety with RW with pt often leaving RW behind, such as when walking up to sink, transferring to toilet, and transferring to bed.    Extremity/Trunk Assessment Upper Extremity Assessment Upper Extremity Assessment: Right hand dominant;Overall [facility] for tasks assessed   Lower Extremity Assessment Lower Extremity Assessment: Defer to PT evaluation        Vision       Perception     Praxis     Communication Communication Communication: Impaired Factors Affecting Communication: Hearing impaired   Cognition Arousal: Alert Behavior During Therapy: WFL for tasks assessed/performed Cognition: Cognition impaired, No family/caregiver present to determine baseline     Awareness: Intellectual awareness intact, Online awareness intact Memory impairment (select all impairments): Short-term memory, Working memory Attention impairment (select first level of impairment): Alternating attention Executive functioning impairment (select all impairments): Problem solving OT - Cognition Comments: Pt AAOx4 and pleasant throughout session. Pt with impaired safety awareness, requiring cues for safety throughout session.                 Following commands: Intact        Cueing   Cueing Techniques: Verbal cues  Exercises      Shoulder Instructions       General Comments Pt on RA upon arrival with O2 sat at 100% and HR in the upper 90s. During ambulation and funcitonal mobility pt HR increased to  mid-120s to upper-130s. Pt O2 sat largely >/93% on RA with one brief desat to 89% during functional mobility with O2 sat quickly rising to 98% in standing rest break. At end of session, pt HR in the 90s and O2 sat 99% on RA with pt resting in bed.    Pertinent Vitals/ Pain       Pain Assessment Pain Assessment: No/denies pain Pain Intervention(s): Monitored during session  Home Living                                          Prior Functioning/Environment              Frequency  Min 2X/week        Progress Toward Goals  OT Goals(current goals can now be found in the care plan section)  Progress towards OT goals: Progressing toward goals     Plan      Co-evaluation                 AM-PAC OT "6 Clicks" Daily Activity     Outcome Measure   Help from another person eating meals?: None Help from another person taking care of personal grooming?: A Little Help from another person toileting, which includes using toliet, bedpan, or urinal?: A Little Help from another person bathing (including washing, rinsing, drying)?: A Little Help from another person to put on and taking off regular upper body clothing?: A Little Help from another person to put on and taking off regular lower body  clothing?: A Little 6 Click Score: 19    End of Session Equipment Utilized During Treatment: Rolling walker (2 wheels);Gait belt  OT Visit Diagnosis: Unsteadiness on feet (R26.81);Other abnormalities of gait and mobility (R26.89);Other (comment) (decreased activity tolerance)   Activity Tolerance Patient tolerated treatment well   Patient Left in bed;with call bell/phone within reach;with bed alarm set   Nurse Communication Mobility status;Other (comment) (vital signs)        Time: 5621-3086 OT Time Calculation (min): 36 min  Charges: OT General Charges $OT Visit: 1 Visit OT Treatments $Self Care/Home Management : 23-37 mins  Ela Moffat "Darral Ellis., OTR/L,  MA Acute Rehab [phone]   Walt Gunner 07/07/2023, 3:38 PM

## 2023-07-07 NOTE — Plan of Care (Signed)

## 2023-07-07 NOTE — Progress Notes (Signed)
 Mobility Specialist Progress Note;   07/07/23 0900  Mobility  Activity Transferred from bed to chair  Level of Assistance Contact guard assist, steadying assist  Assistive Device Front wheel walker  Distance Ambulated (ft) 5 ft  Activity Response Tolerated well  Mobility Referral Yes  Mobility visit 1 Mobility  Mobility Specialist Start Time (ACUTE ONLY) 0900  Mobility Specialist Stop Time (ACUTE ONLY) 0917  Mobility Specialist Time Calculation (min) (ACUTE ONLY) 17 min   Pt agreeable to mobility. Deferred ambulation at the time however agreeable to sit up in the chair. Required MinG assistance to stand from EOB and transfer safely to chair. VSS on 2LO2 when able to receive accurate pleth reading. RN come in room to replace SPO2 finger monitor during session. No c/o when asked. Pt left in chair with all needs met, alarm on.   Janit Meline Mobility Specialist Please contact via SecureChat or [employer] [phone]

## 2023-07-08 DIAGNOSIS — J9602 Acute respiratory failure with hypercapnia: Secondary | ICD-10-CM | POA: Diagnosis not present

## 2023-07-08 DIAGNOSIS — J9601 Acute respiratory failure with hypoxia: Secondary | ICD-10-CM | POA: Diagnosis not present

## 2023-07-08 DIAGNOSIS — E1142 Type 2 diabetes mellitus with diabetic polyneuropathy: Secondary | ICD-10-CM | POA: Diagnosis not present

## 2023-07-08 DIAGNOSIS — I5041 Acute combined systolic (congestive) and diastolic (congestive) heart failure: Secondary | ICD-10-CM | POA: Diagnosis not present

## 2023-07-08 DIAGNOSIS — J441 Chronic obstructive pulmonary disease with (acute) exacerbation: Secondary | ICD-10-CM | POA: Diagnosis not present

## 2023-07-08 LAB — RENAL FUNCTION PANEL
Albumin: 3 g/dL — ABNORMAL LOW (ref 3.5–5.0)
Anion gap: 11 (ref 5–15)
BUN: 12 mg/dL (ref 8–23)
CO2: 25 mmol/L (ref 22–32)
Calcium: 8.9 mg/dL (ref 8.9–10.3)
Chloride: 99 mmol/L (ref 98–111)
Creatinine, Ser: 0.68 mg/dL (ref 0.44–1.00)
GFR, Estimated: >60 mL/min (ref >60)
Glucose, Bld: 130 mg/dL — ABNORMAL HIGH (ref 70–99)
Phosphorus: 3.4 mg/dL (ref 2.5–4.6)
Potassium: 4.3 mmol/L (ref 3.5–5.1)
Sodium: 135 mmol/L (ref 135–145)

## 2023-07-08 LAB — GLUCOSE, CAPILLARY
Glucose-Capillary: 120 mg/dL — ABNORMAL HIGH (ref 70–99)
Glucose-Capillary: 168 mg/dL — ABNORMAL HIGH (ref 70–99)
Glucose-Capillary: 205 mg/dL — ABNORMAL HIGH (ref 70–99)
Glucose-Capillary: 137 mg/dL — ABNORMAL HIGH (ref 70–99)

## 2023-07-08 LAB — CBC
HCT: 37.8 % (ref 36.0–46.0)
Hemoglobin: 12.3 g/dL (ref 12.0–15.0)
MCH: 27.5 pg (ref 26.0–34.0)
MCHC: 32.5 g/dL (ref 30.0–36.0)
MCV: 84.4 fL (ref 80.0–100.0)
Platelets: 257 10*3/uL (ref 150–400)
RBC: 4.48 MIL/uL (ref 3.87–5.11)
RDW: 13.2 % (ref 11.5–15.5)
WBC: 17.5 10*3/uL — ABNORMAL HIGH (ref 4.0–10.5)
nRBC: 0 % (ref 0.0–0.2)

## 2023-07-08 LAB — MAGNESIUM: Magnesium: 2 mg/dL (ref 1.7–2.4)

## 2023-07-08 MED ORDER — GERHARDT'S BUTT CREAM
TOPICAL_CREAM | CUTANEOUS | Status: DC | PRN
  Filled 2023-07-08: qty 60

## 2023-07-08 MED ORDER — DAPAGLIFLOZIN PROPANEDIOL 10 MG PO TABS
10.0000 mg | ORAL_TABLET | Freq: Every day | ORAL | Status: DC
  Administered 2023-07-09 – 2023-07-10 (×2): 10 mg via ORAL
  Filled 2023-07-08 (×2): qty 1

## 2023-07-08 MED ORDER — SODIUM CHLORIDE 0.9 % IV SOLN: INTRAVENOUS | Status: DC

## 2023-07-08 MED ORDER — CARVEDILOL 3.125 MG PO TABS
3.1250 mg | ORAL_TABLET | Freq: Two times a day (BID) | ORAL | Status: DC
Start: 2023-07-08 — End: 2023-07-10
  Administered 2023-07-08 – 2023-07-10 (×3): 3.125 mg via ORAL
  Filled 2023-07-08 (×4): qty 1

## 2023-07-08 NOTE — Progress Notes (Signed)
 Mobility Specialist Progress Note;   07/08/23 0905  Mobility  Activity Ambulated with assistance to bathroom;Ambulated with assistance in room;Transferred from bed to chair  Level of Assistance Contact guard assist, steadying assist  Assistive Device Front wheel walker  Distance Ambulated (ft) 20 ft  Activity Response Tolerated well  Mobility Referral Yes  Mobility visit 1 Mobility  Mobility Specialist Start Time (ACUTE ONLY) [id number]  Mobility Specialist Stop Time (ACUTE ONLY) [id number]  Mobility Specialist Time Calculation (min) (ACUTE ONLY) 18 min   Pt agreeable to mobility, in BR upon arrival after having BM episode requiring clean up. Required light MinG assistance from bathroom to chair in room. VSS on RA when able to receive accurate pleth reading. No c/o when asked. Pt left in chair with all needs met. Son in room.   Janit Meline Mobility Specialist Please contact via SecureChat or [employer] [phone]

## 2023-07-08 NOTE — Progress Notes (Signed)
 Left and right heart catheterization planned for tomorrow, NPO after midnight, orders placed, per Dr. Albert Huff request

## 2023-07-08 NOTE — H&P (View-Only) (Signed)
 Rounding Note    Patient Name: Laura Dalton Date of Encounter: 07/08/2023  [facility] Cardiologist: Armida Lander, MD   Subjective   No events overnight. Denies anginal chest pain. Diuresing well. Respiratory status is improved significantly.  No longer requiring nasal cannula oxygen  Inpatient Medications    Scheduled Meds:  amitriptyline   25 mg Oral QHS   atorvastatin   40 mg Oral Daily   budesonide  (PULMICORT ) nebulizer solution  0.5 mg Nebulization BID   carvedilol  3.125 mg Oral BID WC   clopidogrel   75 mg Oral Daily   [START ON 07/09/2023] dapagliflozin propanediol  10 mg Oral Daily   DULoxetine   60 mg Oral QHS   enoxaparin  (LOVENOX ) injection  40 mg Subcutaneous Q24H   feeding supplement  237 mL Oral BID BM   insulin  aspart  0-15 Units Subcutaneous TID WC   insulin  aspart  0-5 Units Subcutaneous QHS   ipratropium-albuterol   3 mL Nebulization BID   levothyroxine   125 mcg Oral QAC breakfast   multivitamin with minerals  1 tablet Oral Daily   predniSONE   50 mg Oral Q breakfast   Continuous Infusions:  azithromycin  Stopped (07/07/23 1946)   cefTRIAXone  (ROCEPHIN )  IV 2 g (07/07/23 1545)   metronidazole  500 mg (07/08/23 1210)   PRN Meds: acetaminophen  **OR** acetaminophen , Gerhardt's butt cream, ipratropium-albuterol , ondansetron  **OR** ondansetron  (ZOFRAN ) IV, mouth rinse   Vital Signs    Vitals:   07/08/23 0523 07/08/23 0805 07/08/23 0830 07/08/23 1155  BP:  119/72  121/67  Pulse: 88   100  Resp: 17 18  18   Temp: 98.2 F (36.8 C) 97.8 F (36.6 C)  98 F (36.7 C)  TempSrc: Oral     SpO2:  100% 100% 97%  Weight:      Height:        Intake/Output Summary (Last 24 hours) at 07/08/2023 1448 Last data filed at 07/08/2023 1300 Gross per 24 hour  Intake 668 ml  Output 950 ml  Net -282 ml      07/08/2023    4:49 AM 07/07/2023    4:33 AM 07/06/2023    4:44 AM  Last 3 Weights  Weight (lbs) 150 lb 153 lb 10.6 oz 154 lb 1.6 oz  Weight  (kg) 68.04 kg 69.7 kg 69.9 kg      Telemetry    NSR - Personally Reviewed  ECG    No new ECG - Personally Reviewed  Physical Exam   GEN: No acute distress.   Neck: No JVD Cardiac: RRR, no murmurs, rubs, or gallops.  Respiratory: Diminished breath sounds on R GI: Soft, nontender, non-distended  MS: No edema; No deformity. Neuro:  Nonfocal  Psych: Normal affect   Labs    High Sensitivity Troponin:  No results for input(s): "TROPONINIHS" in the last 720 hours.   Chemistry Recent Labs  Lab 07/03/23 1318 07/03/23 1531 07/06/23 0349 07/07/23 0332 07/08/23 0310  NA 134*   < > 136 134* 135  K 2.8*   < > 4.6 4.2 4.3  CL 95*   < > 99 100 99  CO2 22   < > 27 24 25   GLUCOSE 354*   < > 119* 133* 130*  BUN 5*   < > 12 12 12   CREATININE 0.86   < > 0.71 0.58 0.68  CALCIUM  9.6   < > 8.8* 8.8* 8.9  MG  --    < > 1.9 2.1 2.0  PROT 6.8  --   --   --   --  ALBUMIN 3.9   < > 3.1* 3.0* 3.0*  AST 24  --   --   --   --   ALT 9  --   --   --   --   ALKPHOS 80  --   --   --   --   BILITOT 0.6  --   --   --   --   GFRNONAA >60   < > >60 >60 >60  ANIONGAP 17*   < > 10 10 11    < > = values in this interval not displayed.    Lipids No results for input(s): "CHOL", "TRIG", "HDL", "LABVLDL", "LDLCALC", "CHOLHDL" in the last 168 hours.  Hematology Recent Labs  Lab 07/06/23 0349 07/07/23 0332 07/08/23 0310  WBC 24.8* 18.9* 17.5*  RBC 4.55 4.47 4.48  HGB 12.6 12.0 12.3  HCT 38.7 38.0 37.8  MCV 85.1 85.0 84.4  MCH 27.7 26.8 27.5  MCHC 32.6 31.6 32.5  RDW 13.4 13.2 13.2  PLT 290 283 257   Thyroid  No results for input(s): "TSH", "FREET4" in the last 168 hours.  BNP Recent Labs  Lab 07/03/23 1311 07/04/23 0747 07/04/23 1322  BNP 18.6 622.1* 1,348.5*    DDimer No results for input(s): "DDIMER" in the last 168 hours.   Radiology    No results found.   Cardiac Studies   Echocardiogram: April 2025:  1. Left ventricular ejection fraction, by estimation, is 25%. The left   ventricle has severely decreased function. The left ventricle demonstrates  regional wall motion abnormalities with normal function of the LV basal  segments and akinesis of the LV mid   to apical segments. This suggests either a stress (Takotsubo-type)  cardiomyopathy or severe coronary disease. There is mild concentric left  ventricular hypertrophy. Left ventricular diastolic parameters are  indeterminate.   2. Right ventricular systolic function is normal. The right ventricular  size is normal. Tricuspid regurgitation signal is inadequate for assessing  PA pressure.   3. The mitral valve is normal in structure. No evidence of mitral valve  regurgitation. No evidence of mitral stenosis.   4. The aortic valve is tricuspid. There is mild calcification of the  aortic valve. Aortic valve regurgitation is not visualized. No aortic  stenosis is present.   5. The inferior vena cava is normal in size with greater than 50%  respiratory variability, suggesting right atrial pressure of 3 mmHg.   6. A small pericardial effusion is present.   Patient Profile     73 y.o. female with a hx of COPD, T2DM, hypothyroidism  who is being seen for the evaluation of acute systolic heart failure  Assessment & Plan    Acute combined heart failure:  Improving, now euvolemic  Echocardiogram 4/24 showed EF 25% with apical akinesis suggesting either Takotsubo cardiomyopathy in setting of her pneumonia versus LAD territory infarct. - My former partner who saw her on rounds last week recommended undergoing ischemic workup prior to discharge.  Ideally recommending left and right heart catheterization given the severely reduced LVEF.    - Heart catheterization has been held off due to her underlying respiratory status.  She was requiring 6 L of nasal cannula oxygen and now on RA. Able to lay flat and resting comfortably   - Recommending proceeding forward with the original left and right heart catheterization on  Monday, July 09, 2023 Informed Consent   Shared Decision Making/Informed Consent The risks [stroke (1 in 1000), death (1 in 1000), kidney  failure [usually temporary] (1 in 500), bleeding (1 in 200), allergic reaction [possibly serious] (1 in 200)], benefits (diagnostic support and management of coronary artery disease) and alternatives of a cardiac catheterization were discussed in detail with Ms. Marengo and she is willing to proceed.    -Start farxiga 10mg  po qday.  -Start Coreg 3.125mg  po bid  -Up titration of GDMT as labs and renal function allows  -Repeat BNP in the morning.   Acute hypoxic respiratory failure: Due to pneumonia.  Remains on antibiotics.  Oxygen requirements have come down.  Also being treated for COPD exacerbation, currently on IV Solu-Medrol  and scheduled DuoNebs  Hypertension: Blood pressures are acceptable.  Uptitrate GDMT status post heart cath  For questions or updates, please contact [facility] Please consult [email or web address] for contact info under    Signed, Abbigael Detlefsen, DO  07/08/2023, 2:48 PM

## 2023-07-08 NOTE — Progress Notes (Signed)
 Rounding Note    Patient Name: Danine Boughner Date of Encounter: 07/08/2023  [facility] Cardiologist: Armida Lander, MD   Subjective   No events overnight. Denies anginal chest pain. Diuresing well. Respiratory status is improved significantly.  No longer requiring nasal cannula oxygen  Inpatient Medications    Scheduled Meds:  amitriptyline   25 mg Oral QHS   atorvastatin   40 mg Oral Daily   budesonide  (PULMICORT ) nebulizer solution  0.5 mg Nebulization BID   carvedilol  3.125 mg Oral BID WC   clopidogrel   75 mg Oral Daily   [START ON 07/09/2023] dapagliflozin propanediol  10 mg Oral Daily   DULoxetine   60 mg Oral QHS   enoxaparin  (LOVENOX ) injection  40 mg Subcutaneous Q24H   feeding supplement  237 mL Oral BID BM   insulin  aspart  0-15 Units Subcutaneous TID WC   insulin  aspart  0-5 Units Subcutaneous QHS   ipratropium-albuterol   3 mL Nebulization BID   levothyroxine   125 mcg Oral QAC breakfast   multivitamin with minerals  1 tablet Oral Daily   predniSONE   50 mg Oral Q breakfast   Continuous Infusions:  azithromycin  Stopped (07/07/23 1946)   cefTRIAXone  (ROCEPHIN )  IV 2 g (07/07/23 1545)   metronidazole  500 mg (07/08/23 1210)   PRN Meds: acetaminophen  **OR** acetaminophen , Gerhardt's butt cream, ipratropium-albuterol , ondansetron  **OR** ondansetron  (ZOFRAN ) IV, mouth rinse   Vital Signs    Vitals:   07/08/23 0523 07/08/23 0805 07/08/23 0830 07/08/23 1155  BP:  119/72  121/67  Pulse: 88   100  Resp: 17 18  18   Temp: 98.2 F (36.8 C) 97.8 F (36.6 C)  98 F (36.7 C)  TempSrc: Oral     SpO2:  100% 100% 97%  Weight:      Height:        Intake/Output Summary (Last 24 hours) at 07/08/2023 1448 Last data filed at 07/08/2023 1300 Gross per 24 hour  Intake 668 ml  Output 950 ml  Net -282 ml      07/08/2023    4:49 AM 07/07/2023    4:33 AM 07/06/2023    4:44 AM  Last 3 Weights  Weight (lbs) 150 lb 153 lb 10.6 oz 154 lb 1.6 oz  Weight  (kg) 68.04 kg 69.7 kg 69.9 kg      Telemetry    NSR - Personally Reviewed  ECG    No new ECG - Personally Reviewed  Physical Exam   GEN: No acute distress.   Neck: No JVD Cardiac: RRR, no murmurs, rubs, or gallops.  Respiratory: Diminished breath sounds on R GI: Soft, nontender, non-distended  MS: No edema; No deformity. Neuro:  Nonfocal  Psych: Normal affect   Labs    High Sensitivity Troponin:  No results for input(s): "TROPONINIHS" in the last 720 hours.   Chemistry Recent Labs  Lab 07/03/23 1318 07/03/23 1531 07/06/23 0349 07/07/23 0332 07/08/23 0310  NA 134*   < > 136 134* 135  K 2.8*   < > 4.6 4.2 4.3  CL 95*   < > 99 100 99  CO2 22   < > 27 24 25   GLUCOSE 354*   < > 119* 133* 130*  BUN 5*   < > 12 12 12   CREATININE 0.86   < > 0.71 0.58 0.68  CALCIUM  9.6   < > 8.8* 8.8* 8.9  MG  --    < > 1.9 2.1 2.0  PROT 6.8  --   --   --   --  ALBUMIN 3.9   < > 3.1* 3.0* 3.0*  AST 24  --   --   --   --   ALT 9  --   --   --   --   ALKPHOS 80  --   --   --   --   BILITOT 0.6  --   --   --   --   GFRNONAA >60   < > >60 >60 >60  ANIONGAP 17*   < > 10 10 11    < > = values in this interval not displayed.    Lipids No results for input(s): "CHOL", "TRIG", "HDL", "LABVLDL", "LDLCALC", "CHOLHDL" in the last 168 hours.  Hematology Recent Labs  Lab 07/06/23 0349 07/07/23 0332 07/08/23 0310  WBC 24.8* 18.9* 17.5*  RBC 4.55 4.47 4.48  HGB 12.6 12.0 12.3  HCT 38.7 38.0 37.8  MCV 85.1 85.0 84.4  MCH 27.7 26.8 27.5  MCHC 32.6 31.6 32.5  RDW 13.4 13.2 13.2  PLT 290 283 257   Thyroid  No results for input(s): "TSH", "FREET4" in the last 168 hours.  BNP Recent Labs  Lab 07/03/23 1311 07/04/23 0747 07/04/23 1322  BNP 18.6 622.1* 1,348.5*    DDimer No results for input(s): "DDIMER" in the last 168 hours.   Radiology    No results found.   Cardiac Studies   Echocardiogram: April 2025:  1. Left ventricular ejection fraction, by estimation, is 25%. The left   ventricle has severely decreased function. The left ventricle demonstrates  regional wall motion abnormalities with normal function of the LV basal  segments and akinesis of the LV mid   to apical segments. This suggests either a stress (Takotsubo-type)  cardiomyopathy or severe coronary disease. There is mild concentric left  ventricular hypertrophy. Left ventricular diastolic parameters are  indeterminate.   2. Right ventricular systolic function is normal. The right ventricular  size is normal. Tricuspid regurgitation signal is inadequate for assessing  PA pressure.   3. The mitral valve is normal in structure. No evidence of mitral valve  regurgitation. No evidence of mitral stenosis.   4. The aortic valve is tricuspid. There is mild calcification of the  aortic valve. Aortic valve regurgitation is not visualized. No aortic  stenosis is present.   5. The inferior vena cava is normal in size with greater than 50%  respiratory variability, suggesting right atrial pressure of 3 mmHg.   6. A small pericardial effusion is present.   Patient Profile     73 y.o. female with a hx of COPD, T2DM, hypothyroidism  who is being seen for the evaluation of acute systolic heart failure  Assessment & Plan    Acute combined heart failure:  Improving, now euvolemic  Echocardiogram 4/24 showed EF 25% with apical akinesis suggesting either Takotsubo cardiomyopathy in setting of her pneumonia versus LAD territory infarct. - My former partner who saw her on rounds last week recommended undergoing ischemic workup prior to discharge.  Ideally recommending left and right heart catheterization given the severely reduced LVEF.    - Heart catheterization has been held off due to her underlying respiratory status.  She was requiring 6 L of nasal cannula oxygen and now on RA. Able to lay flat and resting comfortably   - Recommending proceeding forward with the original left and right heart catheterization on  Monday, July 09, 2023 Informed Consent   Shared Decision Making/Informed Consent The risks [stroke (1 in 1000), death (1 in 1000), kidney  failure [usually temporary] (1 in 500), bleeding (1 in 200), allergic reaction [possibly serious] (1 in 200)], benefits (diagnostic support and management of coronary artery disease) and alternatives of a cardiac catheterization were discussed in detail with Ms. Marengo and she is willing to proceed.    -Start farxiga 10mg  po qday.  -Start Coreg 3.125mg  po bid  -Up titration of GDMT as labs and renal function allows  -Repeat BNP in the morning.   Acute hypoxic respiratory failure: Due to pneumonia.  Remains on antibiotics.  Oxygen requirements have come down.  Also being treated for COPD exacerbation, currently on IV Solu-Medrol  and scheduled DuoNebs  Hypertension: Blood pressures are acceptable.  Uptitrate GDMT status post heart cath  For questions or updates, please contact [facility] Please consult [email or web address] for contact info under    Signed, Abbigael Detlefsen, DO  07/08/2023, 2:48 PM

## 2023-07-08 NOTE — Progress Notes (Signed)
 Nurse requested Mobility Specialist to perform oxygen saturation test with pt which includes removing pt from oxygen both at rest and while ambulating.  Below are the results from that testing.     Patient Saturations on Room Air at Rest = spO2 90%  Patient Saturations on Room Air while Ambulating = sp02 90-99% .   Patient Saturations on N/A Liters of oxygen while Ambulating = sp02 90%> .  At end of testing pt left in room on N/A  Liters of oxygen.  Reported results to nurse.    Janit Meline Mobility Specialist Please contact via SecureChat or [employer] [phone]

## 2023-07-08 NOTE — Progress Notes (Signed)
 PROGRESS NOTE    Laura Dalton  ZOX:[id number] DOB: 1950/08/29 DOA: 07/03/2023 PCP: Dettinger, Lucio Sabin, MD   Brief Narrative:  73 y.o. female with PMH of COPD, DM-2, with neuropathy, HTN, chronic back pain, lumbar laminectomy, thoracic compression fraction, hypothyroidism, memory loss and osteoporosis presenting with progressive shortness of breath, DOE, wheezing, weakness and diaphoresis, and admitted with acute respiratory failure with hypoxia and hypercapnia in the setting of COPD exacerbation.  Reportedly saturating at 70% on room air when EMS arrived.  Wheezing with work of breathing as well.  In ED, she was started on BiPAP.  She desaturated to 86% when weaned to 6 L.  She was put back on BiPAP.    The next day, patient remained on BiPAP.  CT angio chest negative for PE but large RLL airspace opacity with small associated right pleural effusion and mild opacity in RUL consistent with pneumonia.  BNP trended up to 1300.  IV fluid discontinued.  Started on ceftriaxone  and Lasix .  TTE with LVEF of 25% and RWMA pattern suggesting Takotsubo cardiomyopathy or severe CAD.  Cardiology consulted.  Diuretics discontinued.    Respiratory failure resolving.  Neurology following.  Assessment & Plan:   Principal Problem:   Acute respiratory failure with hypoxia and hypercapnia (HCC) Active Problems:   COPD with acute exacerbation (HCC)   Essential hypertension   DM (diabetes mellitus) (HCC)   Diabetic peripheral neuropathy associated with type 2 diabetes mellitus (HCC)   Acquired hypothyroidism   Degeneration of spine   Hyponatremia   Protein-calorie malnutrition, severe   Aspiration pneumonia (HCC)   Pressure injury of skin   Acute combined systolic and diastolic heart failure (HCC)   Acute respiratory failure with hypoxia and hypercapnia, multifactorial:  - Secondary to overlying COPD exacerbation, pneumonia and acute heart failure exacerbation - Reports sats of 70% on room air at  intake -initially requiring BiPAP given hypercarbia - Viral respiratory panel negative - Imaging negative for PE but notable right upper and lower lobe opacities concerning for pneumonia - TTE confirms EF 25% -received fluids and intake subsequently requiring transient diuresis due to volume overload - Azithromycin , ceftriaxone , metronidazole  to complete 07/08/2023 - Steroid taper ongoing - Currently weaned to room air, oxygen ambulatory screen within normal limits(not requiring supplemental oxygen)   Acute combined CHF:  -TTE with LVEF of 25%, RWMA and possible and normal RV function.  - Cardiology following, appreciate insight recommendations -Approaching euvolemia -IV Lasix  discontinued - Heart catheterization pending cardiology recommendations   Sepsis, POA, resolved Right lung pneumonia/COPD exacerbation:  - Resolved, completed antibiotics, steroid taper as aboveHyperglycemia: A1c 5.3%.  Not on medication.    Steroid-induced hyperglycemia, resolved  - Continue sliding scale insulin , hypoglycemic protocol in the interim   Chronic back pain: Reports using ibuprofen  at home.  Denies using oxycodone  or Robaxin . -Tylenol  as needed -no indication for narcotics   Mood disorder, unspecified: Stable -Continue home meds after med rec Hypothyroidism- Continue home Synthroid  after med rec Essential hypertension - Continue home meds as appropriate Hyponatremia: Mild -not clinically relevant Hyperkalemia/hypokalemia: -Stabilizing  Severe malnutrition Body mass index is 24.8 kg/m. Nutrition Problem: Severe Malnutrition Etiology: chronic illness Signs/Symptoms: energy intake < 75% for > or equal to 3 months, percent weight loss Percent weight loss: 13 % Interventions: Ensure Enlive (each supplement provides 350kcal and 20 grams of protein), MVI   Stage II sacral decubitus: Pressure Injury 07/04/23 Sacrum Right Stage 2 -  Partial thickness loss of dermis presenting as a shallow open  injury with a red, pink wound bed without slough. dry (Active)  07/04/23 1353  Location: Sacrum  Location Orientation: Right  Staging: Stage 2 -  Partial thickness loss of dermis presenting as a shallow open injury with a red, pink wound bed without slough.  Wound Description (Comments): dry  Present on Admission: Yes   DVT prophylaxis: enoxaparin  (LOVENOX ) injection 40 mg Start: 07/03/23 1600 Code Status:   Code Status: Full Code Family Communication: None present  Status is: Inpatient  Dispo: The patient is from: Home              Anticipated d/c is to: To be determined              Anticipated d/c date is: 40 to 72 hours              Patient currently not medically stable for discharge  Consultants:  Cardiology  Procedures:  Cardiac catheterization pending  Antimicrobials:  Completed as above  Subjective: No acute issues or events overnight denies nausea vomiting diarrhea constipation any fever chills or chest pain.  Respiratory status improving, not yet back to baseline  Objective: Vitals:   07/07/23 2002 07/08/23 0017 07/08/23 0449 07/08/23 0523  BP: 122/74 110/67 115/70   Pulse: (!) 104 92 88 88  Resp: 20 19 19 17   Temp: 98.1 F (36.7 C) 98.4 F (36.9 C) 98.2 F (36.8 C) 98.2 F (36.8 C)  TempSrc: Oral Oral Oral Oral  SpO2: 98% 99% 99%   Weight:   68 kg   Height:        Intake/Output Summary (Last 24 hours) at 07/08/2023 0702 Last data filed at 07/08/2023 0525 Gross per 24 hour  Intake 428 ml  Output 950 ml  Net -522 ml   Filed Weights   07/06/23 0444 07/07/23 0433 07/08/23 0449  Weight: 69.9 kg 69.7 kg 68 kg    Examination:  General:  Pleasantly resting in bed, No acute distress.  Noted ambulating to the restroom without hypoxia dyspnea or overt symptoms HEENT:  Normocephalic atraumatic.  Sclerae nonicteric, noninjected.  Extraocular movements intact bilaterally. Neck:  Without mass or deformity.  Trachea is midline. Lungs:  Clear to auscultate  bilaterally without rhonchi, wheeze, or rales. Heart:  Regular rate and rhythm.  Without murmurs, rubs, or gallops. Abdomen:  Soft, nontender, nondistended.  Without guarding or rebound. Extremities: Without cyanosis, clubbing, edema, or obvious deformity. Skin:  Warm and dry, no erythema.   Data Reviewed: I have personally reviewed following labs and imaging studies  CBC: Recent Labs  Lab 07/03/23 1318 07/03/23 1531 07/04/23 0600 07/05/23 0836 07/06/23 0349 07/07/23 0332 07/08/23 0310  WBC 20.7*  --  24.6* 28.9* 24.8* 18.9* 17.5*  NEUTROABS 16.1*  --   --   --  20.7*  --   --   HGB 13.4   < > 12.2 13.0 12.6 12.0 12.3  HCT 42.8   < > 38.8 39.6 38.7 38.0 37.8  MCV 86.8  --  88.2 84.3 85.1 85.0 84.4  PLT 448*  --  301 326 290 283 257   < > = values in this interval not displayed.   Basic Metabolic Panel: Recent Labs  Lab 07/03/23 1726 07/04/23 0600 07/05/23 0836 07/06/23 0349 07/07/23 0332 07/08/23 0310  NA  --  140 141 136 134* 135  K  --  5.5* 3.2* 4.6 4.2 4.3  CL  --  106 96* 99 100 99  CO2  --  24 27  27 24 25   GLUCOSE  --  144* 149* 119* 133* 130*  BUN  --  8 13 12 12 12   CREATININE  --  0.65 0.73 0.71 0.58 0.68  CALCIUM   --  9.0 9.7 8.8* 8.8* 8.9  MG 2.2  --  1.9 1.9 2.1 2.0  PHOS  --   --  2.4* 2.3* 2.4* 3.4   GFR: Estimated Creatinine Clearance: 58.6 mL/min (by C-G formula based on SCr of 0.68 mg/dL). Liver Function Tests: Recent Labs  Lab 07/03/23 1318 07/05/23 0836 07/06/23 0349 07/07/23 0332 07/08/23 0310  AST 24  --   --   --   --   ALT 9  --   --   --   --   ALKPHOS 80  --   --   --   --   BILITOT 0.6  --   --   --   --   PROT 6.8  --   --   --   --   ALBUMIN 3.9 3.3* 3.1* 3.0* 3.0*   CBG: Recent Labs  Lab 07/07/23 0604 07/07/23 1139 07/07/23 1638 07/07/23 2105 07/08/23 0607  GLUCAP 109* 149* 170* 171* 120*   Sepsis Labs: Recent Labs  Lab 07/04/23 0747  PROCALCITON 0.24    Recent Results (from the past 240 hours)  Resp panel  by RT-PCR (RSV, Flu A&B, Covid) Anterior Nasal Swab     Status: None   Collection Time: 07/03/23  1:11 PM   Specimen: Anterior Nasal Swab  Result Value Ref Range Status   SARS Coronavirus 2 by RT PCR NEGATIVE NEGATIVE Final   Influenza A by PCR NEGATIVE NEGATIVE Final   Influenza B by PCR NEGATIVE NEGATIVE Final    Comment: (NOTE) The Xpert Xpress SARS-CoV-2/FLU/RSV plus assay is intended as an aid in the diagnosis of influenza from Nasopharyngeal swab specimens and should not be used as a sole basis for treatment. Nasal washings and aspirates are unacceptable for Xpert Xpress SARS-CoV-2/FLU/RSV testing.  Fact Sheet for Patients: BloggerCourse.com  Fact Sheet for Healthcare Providers: SeriousBroker.it  This test is not yet approved or cleared by the [place]  FDA and has been authorized for detection and/or diagnosis of SARS-CoV-2 by FDA under an Emergency Use Authorization (EUA). This EUA will remain in effect (meaning this test can be used) for the duration of the COVID-19 declaration under Section 564(b)(1) of the Act, 21 U.S.C. section 360bbb-3(b)(1), unless the authorization is terminated or revoked.     Resp Syncytial Virus by PCR NEGATIVE NEGATIVE Final    Comment: (NOTE) Fact Sheet for Patients: BloggerCourse.com  Fact Sheet for Healthcare Providers: SeriousBroker.it  This test is not yet approved or cleared by the [place]  FDA and has been authorized for detection and/or diagnosis of SARS-CoV-2 by FDA under an Emergency Use Authorization (EUA). This EUA will remain in effect (meaning this test can be used) for the duration of the COVID-19 declaration under Section 564(b)(1) of the Act, 21 U.S.C. section 360bbb-3(b)(1), unless the authorization is terminated or revoked.  Performed at [facility], [address] [place], [place] [address]    Respiratory (~20 pathogens) panel by PCR     Status: None   Collection Time: 07/03/23  3:49 PM   Specimen: Nasopharyngeal Swab; Respiratory  Result Value Ref Range Status   Adenovirus NOT DETECTED NOT DETECTED Final   Coronavirus 229E NOT DETECTED NOT DETECTED Final    Comment: (NOTE) The Coronavirus on the Respiratory Panel, DOES NOT  test for the novel  Coronavirus (2019 nCoV)    Coronavirus HKU1 NOT DETECTED NOT DETECTED Final   Coronavirus NL63 NOT DETECTED NOT DETECTED Final   Coronavirus OC43 NOT DETECTED NOT DETECTED Final   Metapneumovirus NOT DETECTED NOT DETECTED Final   Rhinovirus / Enterovirus NOT DETECTED NOT DETECTED Final   Influenza A NOT DETECTED NOT DETECTED Final   Influenza B NOT DETECTED NOT DETECTED Final   Parainfluenza Virus 1 NOT DETECTED NOT DETECTED Final   Parainfluenza Virus 2 NOT DETECTED NOT DETECTED Final   Parainfluenza Virus 3 NOT DETECTED NOT DETECTED Final   Parainfluenza Virus 4 NOT DETECTED NOT DETECTED Final   Respiratory Syncytial Virus NOT DETECTED NOT DETECTED Final   Bordetella pertussis NOT DETECTED NOT DETECTED Final   Bordetella Parapertussis NOT DETECTED NOT DETECTED Final   Chlamydophila pneumoniae NOT DETECTED NOT DETECTED Final   Mycoplasma pneumoniae NOT DETECTED NOT DETECTED Final    Comment: Performed at [facility], [address], [place], [place] [address]  MRSA Next Gen by PCR, Nasal     Status: None   Collection Time: 07/04/23  1:57 PM   Specimen: Urine, Clean Catch; Nasal Swab  Result Value Ref Range Status   MRSA by PCR Next Gen NOT DETECTED NOT DETECTED Final    Comment: (NOTE) The GeneXpert MRSA Assay (FDA approved for NASAL specimens only), is one component of a comprehensive MRSA colonization surveillance program. It is not intended to diagnose MRSA infection nor to guide or monitor treatment for MRSA infections. Test performance is not FDA approved in patients less than 60 years old. Performed at [facility], [address] [place], [place] [address]     Radiology Studies: No results found.  Scheduled Meds:  amitriptyline   25 mg Oral QHS   atorvastatin   40 mg Oral Daily   budesonide  (PULMICORT ) nebulizer solution  0.5 mg Nebulization BID   clopidogrel   75 mg Oral Daily   DULoxetine   60 mg Oral QHS   enoxaparin  (LOVENOX ) injection  40 mg Subcutaneous Q24H   feeding supplement  237 mL Oral BID BM   insulin  aspart  0-15 Units Subcutaneous TID WC   insulin  aspart  0-5 Units Subcutaneous QHS   ipratropium-albuterol   3 mL Nebulization BID   levothyroxine   125 mcg Oral QAC breakfast   multivitamin with minerals  1 tablet Oral Daily   predniSONE   50 mg Oral Q breakfast   Continuous Infusions:  azithromycin  Stopped (07/07/23 1946)   cefTRIAXone  (ROCEPHIN )  IV 2 g (07/07/23 1545)   metronidazole  Stopped (07/08/23 0037)     LOS: 5 days   Time spent:  Haydee Lipa, DO [facility]  If 7PM-7AM, please contact night-coverage [email or web address]  07/08/2023, 7:02 AM

## 2023-07-09 ENCOUNTER — Encounter (HOSPITAL_COMMUNITY): Payer: Self-pay | Admitting: Cardiology

## 2023-07-09 ENCOUNTER — Other Ambulatory Visit (HOSPITAL_COMMUNITY): Payer: Self-pay

## 2023-07-09 ENCOUNTER — Encounter (HOSPITAL_COMMUNITY)
Admission: EM | Disposition: A | Discharge status: Home Health | Payer: Self-pay | Source: Home / Self Care | Attending: Student

## 2023-07-09 DIAGNOSIS — I2510 Atherosclerotic heart disease of native coronary artery without angina pectoris: Secondary | ICD-10-CM | POA: Diagnosis not present

## 2023-07-09 DIAGNOSIS — E1142 Type 2 diabetes mellitus with diabetic polyneuropathy: Secondary | ICD-10-CM | POA: Diagnosis not present

## 2023-07-09 DIAGNOSIS — E039 Hypothyroidism, unspecified: Secondary | ICD-10-CM | POA: Diagnosis not present

## 2023-07-09 DIAGNOSIS — J441 Chronic obstructive pulmonary disease with (acute) exacerbation: Secondary | ICD-10-CM | POA: Diagnosis not present

## 2023-07-09 DIAGNOSIS — J9601 Acute respiratory failure with hypoxia: Secondary | ICD-10-CM | POA: Diagnosis not present

## 2023-07-09 HISTORY — PX: RIGHT/LEFT HEART CATH AND CORONARY ANGIOGRAPHY: CATH118266

## 2023-07-09 LAB — POCT I-STAT 7, (LYTES, BLD GAS, ICA,H+H)
Acid-Base Excess: 1 mmol/L (ref 0.0–2.0)
Bicarbonate: 25 mmol/L (ref 20.0–28.0)
Calcium, Ion: 1.12 mmol/L — ABNORMAL LOW (ref 1.15–1.40)
HCT: 35 % — ABNORMAL LOW (ref 36.0–46.0)
Hemoglobin: 11.9 g/dL — ABNORMAL LOW (ref 12.0–15.0)
O2 Saturation: 91 %
Potassium: 3.3 mmol/L — ABNORMAL LOW (ref 3.5–5.1)
Sodium: 138 mmol/L (ref 135–145)
TCO2: 26 mmol/L (ref 22–32)
pCO2 arterial: 35.1 mmHg (ref 32–48)
pH, Arterial: 7.461 — ABNORMAL HIGH (ref 7.35–7.45)
pO2, Arterial: 58 mmHg — ABNORMAL LOW (ref 83–108)

## 2023-07-09 LAB — POCT I-STAT EG7
Acid-Base Excess: 3 mmol/L — ABNORMAL HIGH (ref 0.0–2.0)
Acid-Base Excess: 4 mmol/L — ABNORMAL HIGH (ref 0.0–2.0)
Bicarbonate: 28.1 mmol/L — ABNORMAL HIGH (ref 20.0–28.0)
Bicarbonate: 28.1 mmol/L — ABNORMAL HIGH (ref 20.0–28.0)
Calcium, Ion: 1.22 mmol/L (ref 1.15–1.40)
Calcium, Ion: 1.23 mmol/L (ref 1.15–1.40)
HCT: 37 % (ref 36.0–46.0)
HCT: 38 % (ref 36.0–46.0)
Hemoglobin: 12.6 g/dL (ref 12.0–15.0)
Hemoglobin: 12.9 g/dL (ref 12.0–15.0)
O2 Saturation: 62 %
O2 Saturation: 63 %
Potassium: 3.6 mmol/L (ref 3.5–5.1)
Potassium: 3.6 mmol/L (ref 3.5–5.1)
Sodium: 136 mmol/L (ref 135–145)
Sodium: 136 mmol/L (ref 135–145)
TCO2: 29 mmol/L (ref 22–32)
TCO2: 29 mmol/L (ref 22–32)
pCO2, Ven: 41.6 mmHg — ABNORMAL LOW (ref 44–60)
pCO2, Ven: 41.8 mmHg — ABNORMAL LOW (ref 44–60)
pH, Ven: 7.435 — ABNORMAL HIGH (ref 7.25–7.43)
pH, Ven: 7.438 — ABNORMAL HIGH (ref 7.25–7.43)
pO2, Ven: 31 mmHg — CL (ref 32–45)
pO2, Ven: 32 mmHg (ref 32–45)

## 2023-07-09 LAB — BASIC METABOLIC PANEL WITH GFR
Anion gap: 10 (ref 5–15)
BUN: 13 mg/dL (ref 8–23)
CO2: 26 mmol/L (ref 22–32)
Calcium: 8.8 mg/dL — ABNORMAL LOW (ref 8.9–10.3)
Chloride: 99 mmol/L (ref 98–111)
Creatinine, Ser: 0.64 mg/dL (ref 0.44–1.00)
GFR, Estimated: >60 mL/min (ref >60)
Glucose, Bld: 98 mg/dL (ref 70–99)
Potassium: 3.6 mmol/L (ref 3.5–5.1)
Sodium: 135 mmol/L (ref 135–145)

## 2023-07-09 LAB — BRAIN NATRIURETIC PEPTIDE: B Natriuretic Peptide: 1189.3 pg/mL — ABNORMAL HIGH (ref 0.0–100.0)

## 2023-07-09 LAB — CBC
HCT: 40.2 % (ref 36.0–46.0)
Hemoglobin: 12.9 g/dL (ref 12.0–15.0)
MCH: 27.4 pg (ref 26.0–34.0)
MCHC: 32.1 g/dL (ref 30.0–36.0)
MCV: 85.5 fL (ref 80.0–100.0)
Platelets: 301 10*3/uL (ref 150–400)
RBC: 4.7 MIL/uL (ref 3.87–5.11)
RDW: 13.3 % (ref 11.5–15.5)
WBC: 21.1 10*3/uL — ABNORMAL HIGH (ref 4.0–10.5)
nRBC: 0 % (ref 0.0–0.2)

## 2023-07-09 LAB — GLUCOSE, CAPILLARY
Glucose-Capillary: 100 mg/dL — ABNORMAL HIGH (ref 70–99)
Glucose-Capillary: 105 mg/dL — ABNORMAL HIGH (ref 70–99)
Glucose-Capillary: 173 mg/dL — ABNORMAL HIGH (ref 70–99)
Glucose-Capillary: 156 mg/dL — ABNORMAL HIGH (ref 70–99)

## 2023-07-09 SURGERY — RIGHT/LEFT HEART CATH AND CORONARY ANGIOGRAPHY
Anesthesia: LOCAL

## 2023-07-09 MED ORDER — LIDOCAINE HCL (PF) 1 % IJ SOLN
INTRAMUSCULAR | Status: DC | PRN
  Administered 2023-07-09: 5 mL
  Administered 2023-07-09: 2 mL

## 2023-07-09 MED ORDER — FENTANYL CITRATE (PF) 100 MCG/2ML IJ SOLN
INTRAMUSCULAR | Status: DC | PRN
  Administered 2023-07-09 (×2): 25 ug via INTRAVENOUS

## 2023-07-09 MED ORDER — HEPARIN SODIUM (PORCINE) 1000 UNIT/ML IJ SOLN
INTRAMUSCULAR | Status: AC
  Filled 2023-07-09: qty 10

## 2023-07-09 MED ORDER — FENTANYL CITRATE (PF) 100 MCG/2ML IJ SOLN
INTRAMUSCULAR | Status: AC
  Filled 2023-07-09: qty 2

## 2023-07-09 MED ORDER — HEPARIN SODIUM (PORCINE) 1000 UNIT/ML IJ SOLN
INTRAMUSCULAR | Status: DC | PRN
  Administered 2023-07-09: 5000 [IU] via INTRAVENOUS

## 2023-07-09 MED ORDER — HEPARIN (PORCINE) IN NACL 1000-0.9 UT/500ML-% IV SOLN
INTRAVENOUS | Status: DC | PRN
  Administered 2023-07-09: 1000 mL via SURGICAL_CAVITY

## 2023-07-09 MED ORDER — IOHEXOL 350 MG/ML SOLN
INTRAVENOUS | Status: DC | PRN
  Administered 2023-07-09: 55 mL

## 2023-07-09 MED ORDER — MIDAZOLAM HCL 2 MG/2ML IJ SOLN
INTRAMUSCULAR | Status: DC | PRN
  Administered 2023-07-09 (×2): 1 mg via INTRAVENOUS

## 2023-07-09 MED ORDER — MIDAZOLAM HCL 2 MG/2ML IJ SOLN
INTRAMUSCULAR | Status: AC
  Filled 2023-07-09: qty 2

## 2023-07-09 MED ORDER — VERAPAMIL HCL 2.5 MG/ML IV SOLN
INTRAVENOUS | Status: DC | PRN
  Administered 2023-07-09: 10 mL via INTRA_ARTERIAL

## 2023-07-09 MED ORDER — VERAPAMIL HCL 2.5 MG/ML IV SOLN
INTRAVENOUS | Status: AC
  Filled 2023-07-09: qty 2

## 2023-07-09 MED ORDER — LIDOCAINE HCL (PF) 1 % IJ SOLN
INTRAMUSCULAR | Status: AC
  Filled 2023-07-09: qty 30

## 2023-07-09 SURGICAL SUPPLY — 16 items
CATH 5FR JL3.5 JR4 ANG PIG MP (CATHETERS) IMPLANT
CATH LAUNCHER 5F JL3 (CATHETERS) IMPLANT
CATH SWAN GANZ 7F STRAIGHT (CATHETERS) IMPLANT
DEVICE RAD TR BAND REGULAR (VASCULAR PRODUCTS) IMPLANT
GLIDESHEATH SLEND SS 6F .021 (SHEATH) IMPLANT
GLIDESHEATH SLENDER 7FR .021G (SHEATH) IMPLANT
GUIDEWIRE INQWIRE 1.5J.035X260 (WIRE) IMPLANT
KIT HEART LEFT (KITS) IMPLANT
KIT SYRINGE INJ CVI SPIKEX1 (MISCELLANEOUS) IMPLANT
PACK CARDIAC CATHETERIZATION (CUSTOM PROCEDURE TRAY) ×1 IMPLANT
SHEATH PROBE COVER 6X72 (BAG) IMPLANT
TRANSDUCER W/STOPCOCK (MISCELLANEOUS) IMPLANT
TUBING CIL FLEX 10 FLL-RA (TUBING) IMPLANT
WIRE EMERALD 3MM-J .025X260CM (WIRE) IMPLANT
WIRE HI TORQ VERSACORE-J 145CM (WIRE) IMPLANT
WIRE MICROINTRODUCER 60CM (WIRE) IMPLANT

## 2023-07-09 NOTE — Progress Notes (Signed)
 Heart Failure Stewardship Pharmacist Progress Note   PCP: Dettinger, Lucio Sabin, MD PCP-Cardiologist: Armida Lander, MD    HPI:  73 yo F with PMH of COPD, DM-2, with neuropathy, HTN, chronic back pain, lumbar laminectomy, thoracic compression fraction, hypothyroidism, memory loss and osteoporosis   Presented to the ED on 4/22 for with hypoxia, worsening SOB, and worsening weakness that began about 5 days prior to admission when she tried leaving her house. Patient also found to be diaphoretic with wheezing on presentation. Had 1 episode of non-blooody emesis. Patient received steroids and bronchodilators in route.   In the ED, patient was tachycardic, hypoxic with rhonchi present. Patient placed on BiPAP (did not tolerate [place]). Patient admitted to having a viral respiratory infection 2 weeks prior to admission, with symptoms of chest congestion, cough, and SOB but deferred medical care. Patient received CRO, azithromycin , and IV fluids in the ED. No JVD present. CXR on 4/22 showed heart size within normal limits and no active disease. CXR 4/23 showed new small right pleural effusion and new diffuse increased interstitial markings with septal thickening suggestive of mild congestive HF. CT chest 4/23 showed large R lower lobe and mild upper lobe airspace opacity consistent with PNA. ECHO 4/24 showed LVEF 25% (60-65% 07/2015) )with severely decreased function of LV and regional wall motion abnormalities. Akinesis of the LV mid to apical segments, suggesting Takotsubo-type cardiomyopathy or severe CAD. Mild concentric LV hypertrophy present. Normal RV systolic function. MV normal. Mild calcification of the aortic valve. Small pericardial effusion present. Cardiology consulted. IV diuretics discontinued due to euvolemic on exam. Planned R/LHC on 4/28 showed RA 4, PA 20, PCWP 12, Fick CO/CI 5.03/2.84. Presumed stress induced cardiomyopathy.  Denies SOB, orthopnea. No BL leg swelling present on exam. No  complaints noted. Patient is interested in [facility] pharmacy for discharge medications and [facility] pharmacy mail order service. Reported that she can afford the once payment of $182.14, followed by the monthly copay of ~$45.   Current HF Medications: Beta Blocker: Carvedilol 3.125mg  BID  SGLT2i: Farxiga 10mg  daily   Prior to admission HF Medications: ACE/ARB/ARNI: lisinopril  40mg  daily   Pertinent Lab Values: Serum creatinine 0.68>0.64, BUN 12>13, Potassium 4.3>3.6, Sodium 135, BNP 1348.5, Magnesium  2 (4/27), A1c 5.3 (07/03/23)   Vital Signs: Weight: 150 (4/27) lbs (admission weight: 149 lbs) Blood pressure: 110-130s/60-70s  Heart rate: 70-90s  I/O: net - yesterday; net +2082L since admission O2: BiPAP > 10L HFNC > 2LNC > RA  Medication Assistance / Insurance Benefits Check: Does the patient have prescription insurance?  Yes Type of insurance plan: Medicare   Outpatient Pharmacy:  Prior to admission outpatient pharmacy: [name], [place]  Is the patient willing to use [facility] TOC pharmacy at discharge? Yes Is the patient willing to transition their outpatient pharmacy to utilize a [facility] outpatient pharmacy? Yes     Assessment: 1. 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 NYHA class 1 symptoms. Strict I/Os and daily weights. Keep K>4 and Mg>2.  - Continue Coreg 3.125mg  BID  - Continue Farxiga 10mg  daily  - Consider adding Spironolactone 12.5mg  daily after cath today  - Consider GDMT titration with Entresto 24/26 mg bid upon discharge. Can consider starting valsartan 20mg  BID if cannot tolerate entresto due to low BP.    Plan: 1) Medication changes recommended at this time: - Start Spironolactone 12.5mg  daily after cath today  2) Patient assistance: - Patient is interested in [facility] pharmacy for discharge medications and [facility] pharmacy mail  order service.  - Entresto, farxiga, and jardiance copay -  $182.14 remaining on deductible. Reported that  she can afford the once payment of $182.14, followed by the monthly copay of ~$45. - Will contact [facility] pharmacy for profile transfer to [facility] outpatient pharmacy  3)  Education  - Patient has been educated on current HF medications and potential additions to HF medication regimen - Patient verbalizes understanding that over the next few months, these medication doses may change and more medications may be added to optimize HF regimen - Patient has been educated on basic disease state pathophysiology and goals of therapy    [name], PharmD PGY1 Pharmacy Resident

## 2023-07-09 NOTE — Interval H&P Note (Signed)
 History and Physical Interval Note:  07/09/2023 8:47 AM  Laura Dalton  has presented today for surgery, with the diagnosis of HF.  The various methods of treatment have been discussed with the patient and family. After consideration of risks, benefits and other options for treatment, the patient has consented to  Procedure(s): RIGHT/LEFT HEART CATH AND CORONARY ANGIOGRAPHY (N/A) as a surgical intervention.  The patient's history has been reviewed, patient examined, no change in status, stable for surgery.  I have reviewed the patient's chart and labs.  Questions were answered to the patient's satisfaction.     [name]

## 2023-07-09 NOTE — Progress Notes (Signed)
 Heart Failure Nurse Navigator Progress Note  PCP: Dettinger, Lucio Sabin, MD PCP-Cardiologist: Branch Admission Diagnosis: Respiratory Distress.  Admitted from: Home via EMS  Presentation:   [facility] presented with O2 saturations in the 70's, wheezes and diaphoretic and weakness. . Placed on CPAP via EMS, and given 125 mg Solumedrol. BP 100/57, HR 99, BNP 1348, CBG 354, Echocardiogram 4/24 showed EF 25% with apical akinesis suggesting either Takotsubo cardiomyopathy in setting of her pneumonia versus LAD territory infarct. , CT angio chest negative for PE but large RLL airspace opacity with small associated right pleural effusion and mild opacity in RUL consistent with pneumonia, R/L HC on 4/28 with no significant findings.   Pateint and her son were educated on the sign and symptoms of heart failure, daily weights, when to call her doctor or go to the ED. Diet/ fluid restrictions ( patient reports to drinking " a Lot" of water and daily drinks of Dr. Angus Kenning ( Mt Dew type soda) continued education on taking all medications as prescribed and attending all medical appointments, patient verbalized her understanding of education, a HF TOC appointment was scheduled for 07/17/2023 @ 2 pm.   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   Children out of town   She depends on her neighbor a lot for yard work and to check on her daily   Social Drivers of Corporate investment banker Strain: Low Risk  (06/21/2023)   Overall Financial Resource Strain (CARDIA)    Difficulty of Paying Living Expenses: Not hard at all  Food Insecurity: No Food Insecurity (07/04/2023)   Hunger Vital Sign    Worried About Running Out of Food in the Last Year: Never true    Ran Out of Food in the Last Year: Never true  Transportation Needs: No Transportation Needs (07/04/2023)   PRAPARE - Administrator, Civil Service (Medical): No    Lack of Transportation (Non-Medical): No  Physical Activity: Inactive (01/31/2023)   Exercise Vital Sign    Days of Exercise per Week: 0 days    Minutes of Exercise per Session: 0 min  Stress: No Stress Concern Present (06/21/2023)  [employer] of Occupational Health - Occupational Stress Questionnaire    Feeling of Stress : Not at all  Social Connections: Moderately Integrated (07/04/2023)   Social Connection and Isolation Panel [NHANES]    Frequency of Communication with Friends and Family: Twice a week    Frequency of Social Gatherings with Friends and Family: Twice a week    Attends Religious Services: More than 4 times per year    Active Member of [employer] or Organizations: Yes    Attends Banker Meetings: More than 4 times per year    Marital Status: Widowed   Education Assessment and Provision:  Detailed education and instructions provided on heart failure disease management  including the following:  Signs and symptoms of Heart Failure When to call the physician Importance of daily weights Low sodium diet Fluid restriction Medication management Anticipated future follow-up appointments  Patient education given on each of the above topics.  Patient acknowledges understanding via teach back method and acceptance of all instructions.  Education Materials:  "Living Better With Heart Failure" Booklet, HF zone tool, & Daily Weight Tracker Tool.  Patient has scale at home: Yes Patient has pill box at home: yes    High Risk Criteria for Readmission and/or Poor Patient Outcomes: Heart failure hospital admissions (last 6 months): 0  No Show rate: 2 %  Difficult social situation: No, lives alone Demonstrates medication adherence: Yes Primary Language: English  Literacy level: reading, writing, and comprehension.   Barriers of Care:   Diet/ fluid restrictions ( soda, lots of water) Daily weights   Considerations/Referrals:   Referral made to Heart Failure Pharmacist Stewardship: Yes Referral made to Heart Failure CSW/NCM TOC: No Referral made to Heart & Vascular TOC clinic: yes, 07/17/2023 @ 2 pm.   Items for Follow-up on DC/TOC: Continued HF education  Diet/ fluid restrictions Daily weights   Randie Bustle, BSN, RN Heart Failure Teacher, adult education Only

## 2023-07-09 NOTE — Progress Notes (Signed)
 Returns to room with TR band in place

## 2023-07-09 NOTE — Progress Notes (Signed)
 PROGRESS NOTE  Laura Dalton NFA:[id number] DOB: 1950-11-17   PCP: Dettinger, Lucio Sabin, MD  Patient is from: Home.  Lives alone.  Uses rolling walker at baseline.   DOA: 07/03/2023 LOS: 6  Chief complaints Chief Complaint  Patient presents with   Respiratory Distress     Brief Narrative / Interim history: 73 y.o. female with PMH of COPD, DM-2, with neuropathy, HTN, chronic back pain, lumbar laminectomy, thoracic compression fraction, hypothyroidism, memory loss and osteoporosis presenting with progressive shortness of breath, DOE, wheezing, weakness and diaphoresis, and admitted with acute respiratory failure with hypoxia and hypercapnia in the setting of COPD exacerbation.  Reportedly saturating at 70% on room air when EMS arrived.  Wheezing with work of breathing as well.  In ED, she was started on BiPAP.  She desaturated to 86% when weaned to 6 L.  She was put back on BiPAP.   The next day, patient remained on BiPAP.  CT angio chest negative for PE but large RLL airspace opacity with small associated right pleural effusion and mild opacity in RUL consistent with pneumonia.  BNP trended up to 1300.  IV fluid discontinued.  Started on ceftriaxone  and Lasix .  TTE with LVEF of 25% and RWMA pattern suggesting Takotsubo cardiomyopathy or severe CAD.  Cardiology consulted.  Diuretics discontinued.   R/LHC on 07/09/2023 no significant finding.  Completed antibiotic course for pneumonia.  Currently on room air.   Subjective: Seen and examined earlier this morning after she returned from heart catheterization.  No major events overnight of this morning.  No complaints.  Objective: Vitals:   07/09/23 0926 07/09/23 0931 07/09/23 1005 07/09/23 1017  BP: 108/66 108/66 96/63 110/68  Pulse: 94 (!) 0    Resp: 15     Temp:      TempSrc:      SpO2: (!) 89% (!) 89% 97% 97%  Weight:      Height:        Examination:  GENERAL: No apparent distress.  Nontoxic. HEENT: MMM.  Vision and hearing  grossly intact.  NECK: Supple.  No apparent JVD.  RESP: No IWOB.  Fair aeration bilaterally. CVS:  RRR. Heart sounds normal.  ABD/GI/GU: BS+. Abd soft, NTND.  MSK/EXT:  Moves extremities. No apparent deformity. No edema.  SKIN: no apparent skin lesion or wound NEURO: Awake, alert and oriented appropriately.  No apparent focal neuro deficit. PSYCH: Calm. Normal affect.   Consultants:  None  Procedures: None  Microbiology summarized: COVID-19, influenza and RSV PCR nonreactive A 20 pathogen RVP nonreactive MRSA PCR screen negative.  Assessment and plan: Acute respiratory failure with hypoxia and hypercapnia due to pneumonia, COPD and CHF: respiratory illness 2 weeks prior.  Reportedly saturating at 70% on RA when EMS arrived.  Desaturated to 86% when weaned off BiPAP to 6 L.  ABG 7.3/53/27.   COVID-19 and full RVP nonreactive.  BNP 18.6>>> 622> 1300.  CXR without infiltrate.  CT angio chest negative for PE but RLL and RUL infiltrate raising suspicion for aspiration pneumonia.  MRSA PCR screen negative.  TTE with acute CHF as below.  R/LHC negative.  Respiratory failure resolved. -Completed antibiotic course with ceftriaxone , Flagyl  and Zithromax  -Completed steroid course. -Continue DuoNebs scheduled and as needed -IS, OOB, ambulatory saturation and PT/OT, -Needs controller on discharge.  Acute combined CHF/stress cardiomyopathy: TTE with LVEF of 25%, RWMA and possible and normal RV function.  Patient appears euvolemic.  BNP trended from 16-1300.  She only received minimal IV fluid on  admission.  Briefly diuresed with IV Lasix .  R/LHC without significant finding.  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   -Now on Coreg, Farxiga -Strict intake and output, daily weight, renal functions and electrolytes  Right lung pneumonia/COPD exacerbation:  -Management as above   Hyperglycemia: A1c 5.3%.  Not on medication.  Hyperglycemia likely due to  steroid Recent Labs  Lab 07/08/23 0607 07/08/23 1154 07/08/23 1520 07/08/23 2100 07/09/23 0644  GLUCAP 120* 168* 205* 137* 100*  -Continue SSI-moderate   Chronic back pain: Reports using ibuprofen  at home.  Denies using oxycodone  or Robaxin . -Tylenol  as needed   Mood disorder: Stable -Continue home meds after med rec   Hypothyroidism - Continue home Synthroid  after med rec   Essential hypertension -On low-dose Coreg.   Hyponatremia: Mild -Monitor   Hypokalemia:  - Monitor replenish as appropriate  Hyperkalemia: Resolved.  Leukocytosis/bandemia: Likely due to pneumonia and steroid.  Improving. -Continue monitoring  Severe malnutrition Body mass index is 24.21 kg/m. Nutrition Problem: Severe Malnutrition Etiology: chronic illness Signs/Symptoms: energy intake < 75% for > or equal to 3 months, percent weight loss Percent weight loss: 13 % Interventions: Ensure Enlive (each supplement provides 350kcal and 20 grams of protein), MVI  Stage II sacral decubitus: Present on admission Pressure Injury 07/04/23 Sacrum Right Stage 2 -  Partial thickness loss of dermis presenting as a shallow open injury with a red, pink wound bed without slough. dry (Active)  07/04/23 1353  Location: Sacrum  Location Orientation: Right  Staging: Stage 2 -  Partial thickness loss of dermis presenting as a shallow open injury with a red, pink wound bed without slough.  Wound Description (Comments): dry  Present on Admission: Yes  Dressing Type Foam - Lift dressing to assess site every shift 07/08/23 0840   DVT prophylaxis:  enoxaparin  (LOVENOX ) injection 40 mg Start: 07/03/23 1600  Code Status: Full code Family Communication: None at bedside. Level of care: Telemetry Cardiac Status is: Inpatient Remains inpatient appropriate because: Acute respiratory failure, COPD exacerbation and acute CHF   Final disposition: Likely home in the next 24 to 48 hours.   55 minutes with more than 50%  spent in reviewing records, counseling patient/family and coordinating care.   Sch Meds:  Scheduled Meds:  amitriptyline   25 mg Oral QHS   atorvastatin   40 mg Oral Daily   budesonide  (PULMICORT ) nebulizer solution  0.5 mg Nebulization BID   carvedilol  3.125 mg Oral BID WC   clopidogrel   75 mg Oral Daily   dapagliflozin propanediol  10 mg Oral Daily   DULoxetine   60 mg Oral QHS   enoxaparin  (LOVENOX ) injection  40 mg Subcutaneous Q24H   feeding supplement  237 mL Oral BID BM   insulin  aspart  0-15 Units Subcutaneous TID WC   insulin  aspart  0-5 Units Subcutaneous QHS   ipratropium-albuterol   3 mL Nebulization BID   levothyroxine   125 mcg Oral QAC breakfast   multivitamin with minerals  1 tablet Oral Daily   Continuous Infusions:   PRN Meds:.acetaminophen  **OR** acetaminophen , Gerhardt's butt cream, ipratropium-albuterol , ondansetron  **OR** ondansetron  (ZOFRAN ) IV, mouth rinse  Antimicrobials: Anti-infectives (From admission, onward)    Start     Dose/Rate Route Frequency Ordered Stop   07/07/23 1700  azithromycin  (ZITHROMAX ) 250 mg in dextrose  5 % 125 mL IVPB        250 mg 127.5 mL/hr over 60 Minutes Intravenous Every 24 hours 07/07/23 0735 07/08/23 1735   07/07/23 1200  metroNIDAZOLE  (FLAGYL ) IVPB 500 mg        500 mg 100 mL/hr over 60 Minutes Intravenous Every 12 hours 07/07/23 0735 07/09/23 0007   07/05/23 1330  metroNIDAZOLE  (FLAGYL ) IVPB 500 mg  Status:  Discontinued        500 mg 100 mL/hr over 60 Minutes Intravenous Every 12 hours 07/05/23 1233 07/07/23 0735   07/04/23 1445  cefTRIAXone  (ROCEPHIN ) 2 g in sodium chloride  0.9 % 100 mL IVPB        2 g 200 mL/hr over 30 Minutes Intravenous Every 24 hours 07/04/23 1352 07/08/23 1657   07/04/23 1400  azithromycin  (ZITHROMAX ) 250 mg in dextrose  5 % 125 mL IVPB  Status:  Discontinued        250 mg 127.5 mL/hr over 60 Minutes Intravenous Every 24 hours 07/03/23 1603 07/07/23 0735   07/03/23 1400  azithromycin  (ZITHROMAX ) 500  mg in sodium chloride  0.9 % 250 mL IVPB        500 mg 250 mL/hr over 60 Minutes Intravenous  Once 07/03/23 1349 07/03/23 1507   07/03/23 1400  cefTRIAXone  (ROCEPHIN ) 1 g in sodium chloride  0.9 % 100 mL IVPB        1 g 200 mL/hr over 30 Minutes Intravenous  Once 07/03/23 1349 07/03/23 1455        I have personally reviewed the following labs and images: CBC: Recent Labs  Lab 07/03/23 1318 07/03/23 1531 07/04/23 0600 07/05/23 0836 07/06/23 0349 07/07/23 0332 07/08/23 0310  WBC 20.7*  --  24.6* 28.9* 24.8* 18.9* 17.5*  NEUTROABS 16.1*  --   --   --  20.7*  --   --   HGB 13.4   < > 12.2 13.0 12.6 12.0 12.3  HCT 42.8   < > 38.8 39.6 38.7 38.0 37.8  MCV 86.8  --  88.2 84.3 85.1 85.0 84.4  PLT 448*  --  301 326 290 283 257   < > = values in this interval not displayed.   BMP &GFR Recent Labs  Lab 07/03/23 1726 07/04/23 0600 07/05/23 0836 07/06/23 0349 07/07/23 0332 07/08/23 0310  NA  --  140 141 136 134* 135  K  --  5.5* 3.2* 4.6 4.2 4.3  CL  --  106 96* 99 100 99  CO2  --  24 27 27 24 25   GLUCOSE  --  144* 149* 119* 133* 130*  BUN  --  8 13 12 12 12   CREATININE  --  0.65 0.73 0.71 0.58 0.68  CALCIUM   --  9.0 9.7 8.8* 8.8* 8.9  MG 2.2  --  1.9 1.9 2.1 2.0  PHOS  --   --  2.4* 2.3* 2.4* 3.4   Estimated Creatinine Clearance: 58.6 mL/min (by C-G formula based on SCr of 0.68 mg/dL). Liver & Pancreas: Recent Labs  Lab 07/03/23 1318 07/05/23 0836 07/06/23 0349 07/07/23 0332 07/08/23 0310  AST 24  --   --   --   --   ALT 9  --   --   --   --   ALKPHOS 80  --   --   --   --   BILITOT 0.6  --   --   --   --   PROT 6.8  --   --   --   --   ALBUMIN 3.9 3.3* 3.1* 3.0* 3.0*   No results for input(s): "LIPASE", "AMYLASE" in the last 168 hours. No results for input(s): "AMMONIA" in the  last 168 hours. Diabetic: No results for input(s): "HGBA1C" in the last 72 hours.  Recent Labs  Lab 07/08/23 0607 07/08/23 1154 07/08/23 1520 07/08/23 2100 07/09/23 0644  GLUCAP  120* 168* 205* 137* 100*   Cardiac Enzymes: No results for input(s): "CKTOTAL", "CKMB", "CKMBINDEX", "TROPONINI" in the last 168 hours. No results for input(s): "PROBNP" in the last 8760 hours. Coagulation Profile: No results for input(s): "INR", "PROTIME" in the last 168 hours. Thyroid  Function Tests: No results for input(s): "TSH", "T4TOTAL", "FREET4", "T3FREE", "THYROIDAB" in the last 72 hours. Lipid Profile: No results for input(s): "CHOL", "HDL", "LDLCALC", "TRIG", "CHOLHDL", "LDLDIRECT" in the last 72 hours. Anemia Panel: No results for input(s): "VITAMINB12", "FOLATE", "FERRITIN", "TIBC", "IRON", "RETICCTPCT" in the last 72 hours. Urine analysis:    Component Value Date/Time   COLORURINE YELLOW 12/11/2022 1742   APPEARANCEUR CLEAR 12/11/2022 1742   APPEARANCEUR Clear 07/28/2021 1634   LABSPEC 1.010 12/11/2022 1742   PHURINE 6.0 12/11/2022 1742   GLUCOSEU NEGATIVE 12/11/2022 1742   HGBUR SMALL (A) 12/11/2022 1742   BILIRUBINUR NEGATIVE 12/11/2022 1742   BILIRUBINUR Negative 07/28/2021 1634   KETONESUR NEGATIVE 12/11/2022 1742   PROTEINUR NEGATIVE 12/11/2022 1742   UROBILINOGEN negative 04/30/2015 1313   NITRITE NEGATIVE 12/11/2022 1742   LEUKOCYTESUR NEGATIVE 12/11/2022 1742   Sepsis Labs: Invalid input(s): "PROCALCITONIN", "LACTICIDVEN"  Microbiology: Recent Results (from the past 240 hours)  Resp panel by RT-PCR (RSV, Flu A&B, Covid) Anterior Nasal Swab     Status: None   Collection Time: 07/03/23  1:11 PM   Specimen: Anterior Nasal Swab  Result Value Ref Range Status   SARS Coronavirus 2 by RT PCR NEGATIVE NEGATIVE Final   Influenza A by PCR NEGATIVE NEGATIVE Final   Influenza B by PCR NEGATIVE NEGATIVE Final    Comment: (NOTE) The Xpert Xpress SARS-CoV-2/FLU/RSV plus assay is intended as an aid in the diagnosis of influenza from Nasopharyngeal swab specimens and should not be used as a sole basis for treatment. Nasal washings and aspirates are unacceptable for  Xpert Xpress SARS-CoV-2/FLU/RSV testing.  Fact Sheet for Patients: BloggerCourse.com  Fact Sheet for Healthcare Providers: SeriousBroker.it  This test is not yet approved or cleared by the [place]  FDA and has been authorized for detection and/or diagnosis of SARS-CoV-2 by FDA under an Emergency Use Authorization (EUA). This EUA will remain in effect (meaning this test can be used) for the duration of the COVID-19 declaration under Section 564(b)(1) of the Act, 21 U.S.C. section 360bbb-3(b)(1), unless the authorization is terminated or revoked.     Resp Syncytial Virus by PCR NEGATIVE NEGATIVE Final    Comment: (NOTE) Fact Sheet for Patients: BloggerCourse.com  Fact Sheet for Healthcare Providers: SeriousBroker.it  This test is not yet approved or cleared by the [place]  FDA and has been authorized for detection and/or diagnosis of SARS-CoV-2 by FDA under an Emergency Use Authorization (EUA). This EUA will remain in effect (meaning this test can be used) for the duration of the COVID-19 declaration under Section 564(b)(1) of the Act, 21 U.S.C. section 360bbb-3(b)(1), unless the authorization is terminated or revoked.  Performed at [facility], [address], [place], [place] [address]   Respiratory (~20 pathogens) panel by PCR     Status: None   Collection Time: 07/03/23  3:49 PM   Specimen: Nasopharyngeal Swab; Respiratory  Result Value Ref Range Status   Adenovirus NOT DETECTED NOT DETECTED Final   Coronavirus 229E NOT DETECTED NOT DETECTED Final    Comment: (  NOTE) The Coronavirus on the Respiratory Panel, DOES NOT test for the novel  Coronavirus (2019 nCoV)    Coronavirus HKU1 NOT DETECTED NOT DETECTED Final   Coronavirus NL63 NOT DETECTED NOT DETECTED Final   Coronavirus OC43 NOT DETECTED NOT DETECTED Final   Metapneumovirus NOT DETECTED NOT  DETECTED Final   Rhinovirus / Enterovirus NOT DETECTED NOT DETECTED Final   Influenza A NOT DETECTED NOT DETECTED Final   Influenza B NOT DETECTED NOT DETECTED Final   Parainfluenza Virus 1 NOT DETECTED NOT DETECTED Final   Parainfluenza Virus 2 NOT DETECTED NOT DETECTED Final   Parainfluenza Virus 3 NOT DETECTED NOT DETECTED Final   Parainfluenza Virus 4 NOT DETECTED NOT DETECTED Final   Respiratory Syncytial Virus NOT DETECTED NOT DETECTED Final   Bordetella pertussis NOT DETECTED NOT DETECTED Final   Bordetella Parapertussis NOT DETECTED NOT DETECTED Final   Chlamydophila pneumoniae NOT DETECTED NOT DETECTED Final   Mycoplasma pneumoniae NOT DETECTED NOT DETECTED Final    Comment: Performed at [facility], [address] [place], [place] [address]  MRSA Next Gen by PCR, Nasal     Status: None   Collection Time: 07/04/23  1:57 PM   Specimen: Urine, Clean Catch; Nasal Swab  Result Value Ref Range Status   MRSA by PCR Next Gen NOT DETECTED NOT DETECTED Final    Comment: (NOTE) The GeneXpert MRSA Assay (FDA approved for NASAL specimens only), is one component of a comprehensive MRSA colonization surveillance program. It is not intended to diagnose MRSA infection nor to guide or monitor treatment for MRSA infections. Test performance is not FDA approved in patients less than 27 years old. Performed at [facility], [address] [place], [place] [address]     Radiology Studies: CARDIAC CATHETERIZATION Result Date: 07/09/2023 HEMODYNAMICS: RA:       4 mmHg (mean) RV:       30/2, 4 mmHg PA:       30/16 mmHg (20 mean) PCWP: 12 mmHg (mean)    Estimated Fick CO/CI   5.03L/min, 2.84L/min/m2    TPG  8  mmHg     PVR  1.6 Wood Units PAPi  3.5  IMPRESSION: Left and right heart catheterization for evaluation of new systolic heart failure. Normal right dominant coronary arteriography. 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 Continue medical therapy       Breindel Collier T. Havish [facility]  If 7PM-7AM, please contact night-coverage [email or web address] 07/09/2023, 11:24 AM

## 2023-07-09 NOTE — Plan of Care (Signed)
  Problem: Education: Goal: Ability to describe self-care measures that may prevent or decrease complications (Diabetes Survival Skills Education) will improve Outcome: Progressing Goal: Individualized Educational Video(s) Outcome: Progressing   Problem: Coping: Goal: Ability to adjust to condition or change in health will improve Outcome: Progressing   Problem: Nutritional: Goal: Maintenance of adequate nutrition will improve Outcome: Progressing Goal: Progress toward achieving an optimal weight will improve Outcome: Progressing   Problem: Tissue Perfusion: Goal: Adequacy of tissue perfusion will improve Outcome: Progressing   Problem: Education: Goal: Knowledge of General Education information will improve Description: Including pain rating scale, medication(s)/side effects and non-pharmacologic comfort measures Outcome: Progressing

## 2023-07-10 ENCOUNTER — Ambulatory Visit: Admitting: Family Medicine

## 2023-07-10 ENCOUNTER — Other Ambulatory Visit (HOSPITAL_COMMUNITY): Payer: Self-pay

## 2023-07-10 ENCOUNTER — Encounter: Payer: Self-pay | Admitting: Family Medicine

## 2023-07-10 DIAGNOSIS — I10 Essential (primary) hypertension: Secondary | ICD-10-CM | POA: Diagnosis not present

## 2023-07-10 DIAGNOSIS — J9601 Acute respiratory failure with hypoxia: Secondary | ICD-10-CM | POA: Diagnosis not present

## 2023-07-10 DIAGNOSIS — I5041 Acute combined systolic (congestive) and diastolic (congestive) heart failure: Secondary | ICD-10-CM | POA: Diagnosis not present

## 2023-07-10 DIAGNOSIS — J690 Pneumonitis due to inhalation of food and vomit: Secondary | ICD-10-CM | POA: Diagnosis not present

## 2023-07-10 DIAGNOSIS — L89302 Pressure ulcer of unspecified buttock, stage 2: Secondary | ICD-10-CM

## 2023-07-10 DIAGNOSIS — E43 Unspecified severe protein-calorie malnutrition: Secondary | ICD-10-CM

## 2023-07-10 DIAGNOSIS — E039 Hypothyroidism, unspecified: Secondary | ICD-10-CM | POA: Diagnosis not present

## 2023-07-10 DIAGNOSIS — I5181 Takotsubo syndrome: Secondary | ICD-10-CM | POA: Diagnosis not present

## 2023-07-10 LAB — CBC
HCT: 37.3 % (ref 36.0–46.0)
Hemoglobin: 12.3 g/dL (ref 12.0–15.0)
MCH: 27.4 pg (ref 26.0–34.0)
MCHC: 33 g/dL (ref 30.0–36.0)
MCV: 83.1 fL (ref 80.0–100.0)
Platelets: 327 10*3/uL (ref 150–400)
RBC: 4.49 MIL/uL (ref 3.87–5.11)
RDW: 13.5 % (ref 11.5–15.5)
WBC: 23.8 10*3/uL — ABNORMAL HIGH (ref 4.0–10.5)
nRBC: 0 % (ref 0.0–0.2)

## 2023-07-10 LAB — RENAL FUNCTION PANEL
Albumin: 2.9 g/dL — ABNORMAL LOW (ref 3.5–5.0)
Anion gap: 10 (ref 5–15)
BUN: 15 mg/dL (ref 8–23)
CO2: 24 mmol/L (ref 22–32)
Calcium: 8.7 mg/dL — ABNORMAL LOW (ref 8.9–10.3)
Chloride: 101 mmol/L (ref 98–111)
Creatinine, Ser: 0.65 mg/dL (ref 0.44–1.00)
GFR, Estimated: >60 mL/min (ref >60)
Glucose, Bld: 105 mg/dL — ABNORMAL HIGH (ref 70–99)
Phosphorus: 4.1 mg/dL (ref 2.5–4.6)
Potassium: 3.9 mmol/L (ref 3.5–5.1)
Sodium: 135 mmol/L (ref 135–145)

## 2023-07-10 LAB — CREATININE, SERUM
Creatinine, Ser: 0.7 mg/dL (ref 0.44–1.00)
GFR, Estimated: >60 mL/min (ref >60)

## 2023-07-10 LAB — MAGNESIUM: Magnesium: 2.2 mg/dL (ref 1.7–2.4)

## 2023-07-10 LAB — GLUCOSE, CAPILLARY: Glucose-Capillary: 110 mg/dL — ABNORMAL HIGH (ref 70–99)

## 2023-07-10 MED ORDER — DONEPEZIL HCL 10 MG PO TABS
10.0000 mg | ORAL_TABLET | Freq: Every day | ORAL | 3 refills | Status: DC
  Filled 2023-07-10 – 2023-10-19 (×2): qty 60, 60d supply, fill #0

## 2023-07-10 MED ORDER — SPIRONOLACTONE 25 MG PO TABS
12.5000 mg | ORAL_TABLET | Freq: Every day | ORAL | 0 refills | Status: DC
  Filled 2023-07-10: qty 45, 90d supply, fill #0

## 2023-07-10 MED ORDER — ENSURE ENLIVE PO LIQD: 237.0000 mL | Freq: Two times a day (BID) | ORAL | Status: AC

## 2023-07-10 MED ORDER — UMECLIDINIUM-VILANTEROL 62.5-25 MCG/ACT IN AEPB
1.0000 | INHALATION_SPRAY | Freq: Every day | RESPIRATORY_TRACT | 0 refills | Status: AC
  Filled 2023-07-10: qty 60, 30d supply, fill #0

## 2023-07-10 MED ORDER — SPIRONOLACTONE 12.5 MG HALF TABLET
12.5000 mg | ORAL_TABLET | Freq: Every day | ORAL | Status: DC
  Administered 2023-07-10: 12.5 mg via ORAL
  Filled 2023-07-10: qty 1

## 2023-07-10 MED ORDER — LOSARTAN POTASSIUM 25 MG PO TABS
25.0000 mg | ORAL_TABLET | Freq: Every day | ORAL | 0 refills | Status: DC
Start: 2023-07-11 — End: 2023-07-17
  Filled 2023-07-10: qty 90, 90d supply, fill #0

## 2023-07-10 MED ORDER — LOSARTAN POTASSIUM 25 MG PO TABS
25.0000 mg | ORAL_TABLET | Freq: Every day | ORAL | Status: DC
  Administered 2023-07-10: 25 mg via ORAL
  Filled 2023-07-10: qty 1

## 2023-07-10 MED ORDER — LISINOPRIL 40 MG PO TABS
40.0000 mg | ORAL_TABLET | Freq: Every day | ORAL | 0 refills | Status: DC
  Filled 2023-07-10: qty 60, 60d supply, fill #0

## 2023-07-10 MED ORDER — DAPAGLIFLOZIN PROPANEDIOL 10 MG PO TABS
10.0000 mg | ORAL_TABLET | Freq: Every day | ORAL | 0 refills | Status: DC
  Filled 2023-07-10: qty 30, 30d supply, fill #0

## 2023-07-10 MED ORDER — UMECLIDINIUM-VILANTEROL 62.5-25 MCG/ACT IN AEPB
1.0000 | INHALATION_SPRAY | Freq: Every day | RESPIRATORY_TRACT | Status: DC
  Administered 2023-07-10: 1 via RESPIRATORY_TRACT
  Filled 2023-07-10: qty 14

## 2023-07-10 MED ORDER — ALBUTEROL SULFATE HFA 108 (90 BASE) MCG/ACT IN AERS
2.0000 | INHALATION_SPRAY | RESPIRATORY_TRACT | 0 refills | Status: DC | PRN
  Filled 2023-07-10: qty 6.7, 25d supply, fill #0

## 2023-07-10 MED ORDER — CARVEDILOL 3.125 MG PO TABS
3.1250 mg | ORAL_TABLET | Freq: Two times a day (BID) | ORAL | 0 refills | Status: DC
  Filled 2023-07-10: qty 180, 90d supply, fill #0

## 2023-07-10 MED ORDER — DIPHENHYDRAMINE HCL 25 MG PO CAPS
25.0000 mg | ORAL_CAPSULE | Freq: Four times a day (QID) | ORAL | Status: DC | PRN
  Administered 2023-07-10: 25 mg via ORAL
  Filled 2023-07-10: qty 1

## 2023-07-10 MED FILL — Clopidogrel Bisulfate Tab 75 MG (Base Equiv): ORAL | 70 days supply | Qty: 70 | Fill #0 | Status: CN

## 2023-07-10 MED FILL — Amitriptyline HCl Tab 50 MG: ORAL | 30 days supply | Qty: 30 | Fill #0 | Status: CN

## 2023-07-10 MED FILL — Alendronate Sodium Tab 70 MG: ORAL | 28 days supply | Qty: 4 | Fill #0 | Status: CN

## 2023-07-10 MED FILL — Duloxetine HCl Enteric Coated Pellets Cap 60 MG (Base Eq): ORAL | 90 days supply | Qty: 90 | Fill #0 | Status: CN

## 2023-07-10 MED FILL — Levothyroxine Sodium Tab 125 MCG: ORAL | 70 days supply | Qty: 70 | Fill #0 | Status: CN

## 2023-07-10 NOTE — Progress Notes (Signed)
 Mobility Specialist Progress Note:   07/10/23 1002  Mobility  Activity Ambulated with assistance in hallway  Level of Assistance Contact guard assist, steadying assist  Assistive Device Front wheel walker  Distance Ambulated (ft) 120 ft  Activity Response Tolerated well  Mobility Referral Yes  Mobility visit 1 Mobility  Mobility Specialist Start Time (ACUTE ONLY) 1005  Mobility Specialist Stop Time (ACUTE ONLY) 1015  Mobility Specialist Time Calculation (min) (ACUTE ONLY) 10 min   Pt agreeable to mobility session. Required contact assist with ambulation with RW. VSS on RA throughout. Pt back in bed with all needs met.   Oneda Big Mobility Specialist Please contact via SecureChat or  Rehab office at [phone]

## 2023-07-10 NOTE — Progress Notes (Signed)
 Nurse requested Mobility Specialist to perform oxygen saturation test with pt which includes removing pt from oxygen both at rest and while ambulating.  Below are the results from that testing.     Patient Saturations on Room Air at Rest = spO2 97%  Patient Saturations on Room Air while Ambulating = sp02 91% .   At end of testing pt left in room on RA   Reported results to nurse.   Oneda Big Mobility Specialist Please contact via SecureChat or  Rehab office at [phone]

## 2023-07-10 NOTE — Plan of Care (Signed)
  Problem: Education: Goal: Ability to describe self-care measures that may prevent or decrease complications (Diabetes Survival Skills Education) will improve Outcome: Progressing Goal: Individualized Educational Video(s) Outcome: Progressing   Problem: Coping: Goal: Ability to adjust to condition or change in health will improve Outcome: Progressing   Problem: Fluid Volume: Goal: Ability to maintain a balanced intake and output will improve Outcome: Progressing   Problem: Health Behavior/Discharge Planning: Goal: Ability to identify and utilize available resources and services will improve Outcome: Progressing Goal: Ability to manage health-related needs will improve Outcome: Progressing   Problem: Metabolic: Goal: Ability to maintain appropriate glucose levels will improve Outcome: Progressing   Problem: Nutritional: Goal: Maintenance of adequate nutrition will improve Outcome: Progressing   Problem: Education: Goal: Knowledge of General Education information will improve Description: Including pain rating scale, medication(s)/side effects and non-pharmacologic comfort measures Outcome: Progressing

## 2023-07-10 NOTE — Progress Notes (Signed)
 Rounding Note    Patient Name: Laura Dalton Date of Encounter: 07/10/2023  [facility] Cardiologist: Armida Lander, MD   Subjective   Cath yesterday with normal coronary arteries and normal filling pressures  Denies any chest pain or shortness of breath this morning.  Anxious to go home  Inpatient Medications    Scheduled Meds:  amitriptyline   25 mg Oral QHS   atorvastatin   40 mg Oral Daily   carvedilol  3.125 mg Oral BID WC   clopidogrel   75 mg Oral Daily   dapagliflozin propanediol  10 mg Oral Daily   DULoxetine   60 mg Oral QHS   enoxaparin  (LOVENOX ) injection  40 mg Subcutaneous Q24H   feeding supplement  237 mL Oral BID BM   insulin  aspart  0-15 Units Subcutaneous TID WC   insulin  aspart  0-5 Units Subcutaneous QHS   levothyroxine   125 mcg Oral QAC breakfast   multivitamin with minerals  1 tablet Oral Daily   umeclidinium-vilanterol  1 puff Inhalation Daily   Continuous Infusions:   PRN Meds: acetaminophen  **OR** acetaminophen , diphenhydrAMINE, Gerhardt's butt cream, ipratropium-albuterol , ondansetron  **OR** ondansetron  (ZOFRAN ) IV, mouth rinse   Vital Signs    Vitals:   07/09/23 2316 07/09/23 2317 07/09/23 2318 07/09/23 2319  BP:   128/89   Pulse: 93 95 (!) 101 89  Resp:   18   Temp:   (!) 97 F (36.1 C)   TempSrc:   Oral   SpO2: 93% 96% 96% 96%  Weight:      Height:        Intake/Output Summary (Last 24 hours) at 07/10/2023 2130 Last data filed at 07/09/2023 2300 Gross per 24 hour  Intake 240 ml  Output 400 ml  Net -160 ml      07/08/2023    4:49 AM 07/07/2023    4:33 AM 07/06/2023    4:44 AM  Last 3 Weights  Weight (lbs) 150 lb 153 lb 10.6 oz 154 lb 1.6 oz  Weight (kg) 68.04 kg 69.7 kg 69.9 kg      Telemetry    Sinus rhythm- Personally Reviewed  ECG    No new ECG - Personally Reviewed  Physical Exam   GEN: Well nourished, well developed in no acute distress HEENT: Normal NECK: No JVD; No carotid  bruits LYMPHATICS: No lymphadenopathy CARDIAC:RRR, no murmurs, rubs, gallops RESPIRATORY:  Clear to auscultation without rales, wheezing or rhonchi  ABDOMEN: Soft, non-tender, non-distended MUSCULOSKELETAL:  No edema; No deformity  SKIN: Warm and dry NEUROLOGIC:  Alert and oriented x 3 PSYCHIATRIC:  Normal affect  Labs    High Sensitivity Troponin:  No results for input(s): "TROPONINIHS" in the last 720 hours.   Chemistry Recent Labs  Lab 07/03/23 1318 07/03/23 1531 07/07/23 0332 07/08/23 0310 07/09/23 0904 07/09/23 0908 07/09/23 1054 07/10/23 0654 07/10/23 0655  NA 134*   < > 134* 135   < > 136  136 135  --  135  K 2.8*   < > 4.2 4.3   < > 3.6  3.6 3.6  --  3.9  CL 95*   < > 100 99  --   --  99  --  101  CO2 22   < > 24 25  --   --  26  --  24  GLUCOSE 354*   < > 133* 130*  --   --  98  --  105*  BUN 5*   < > 12 12  --   --  13  --  15  CREATININE 0.86   < > 0.58 0.68  --   --  0.64 0.70 0.65  CALCIUM  9.6   < > 8.8* 8.9  --   --  8.8*  --  8.7*  MG  --    < > 2.1 2.0  --   --   --  2.2  --   PROT 6.8  --   --   --   --   --   --   --   --   ALBUMIN 3.9   < > 3.0* 3.0*  --   --   --   --  2.9*  AST 24  --   --   --   --   --   --   --   --   ALT 9  --   --   --   --   --   --   --   --   ALKPHOS 80  --   --   --   --   --   --   --   --   BILITOT 0.6  --   --   --   --   --   --   --   --   GFRNONAA >60   < > >60 >60  --   --  >60 >60 >60  ANIONGAP 17*   < > 10 11  --   --  10  --  10   < > = values in this interval not displayed.    Lipids No results for input(s): "CHOL", "TRIG", "HDL", "LABVLDL", "LDLCALC", "CHOLHDL" in the last 168 hours.  Hematology Recent Labs  Lab 07/08/23 0310 07/09/23 0904 07/09/23 0908 07/09/23 1054 07/10/23 0654  WBC 17.5*  --   --  21.1* 23.8*  RBC 4.48  --   --  4.70 4.49  HGB 12.3   < > 12.9  12.6 12.9 12.3  HCT 37.8   < > 38.0  37.0 40.2 37.3  MCV 84.4  --   --  85.5 83.1  MCH 27.5  --   --  27.4 27.4  MCHC 32.5  --   --   32.1 33.0  RDW 13.2  --   --  13.3 13.5  PLT 257  --   --  301 327   < > = values in this interval not displayed.   Thyroid  No results for input(s): "TSH", "FREET4" in the last 168 hours.  BNP Recent Labs  Lab 07/04/23 0747 07/04/23 1322 07/09/23 1054  BNP 622.1* 1,348.5* 1,189.3*    DDimer No results for input(s): "DDIMER" in the last 168 hours.   Radiology    CARDIAC CATHETERIZATION Result Date: 07/09/2023 HEMODYNAMICS: RA:       4 mmHg (mean) RV:       30/2, 4 mmHg PA:       30/16 mmHg (20 mean) PCWP: 12 mmHg (mean)    Estimated Fick CO/CI   5.03L/min, 2.84L/min/m2    TPG  8  mmHg     PVR  1.6 Wood Units PAPi  3.5  IMPRESSION: Left and right heart catheterization for evaluation of new systolic heart failure. Normal right dominant coronary arteriography. 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 Continue medical therapy     Cardiac Studies   Echocardiogram: April 2025:  1.  Left ventricular ejection fraction, by estimation, is 25%. The left  ventricle has severely decreased function. The left ventricle demonstrates  regional wall motion abnormalities with normal function of the LV basal  segments and akinesis of the LV mid   to apical segments. This suggests either a stress (Takotsubo-type)  cardiomyopathy or severe coronary disease. There is mild concentric left  ventricular hypertrophy. Left ventricular diastolic parameters are  indeterminate.   2. Right ventricular systolic function is normal. The right ventricular  size is normal. Tricuspid regurgitation signal is inadequate for assessing  PA pressure.   3. The mitral valve is normal in structure. No evidence of mitral valve  regurgitation. No evidence of mitral stenosis.   4. The aortic valve is tricuspid. There is mild calcification of the  aortic valve. Aortic valve  regurgitation is not visualized. No aortic  stenosis is present.   5. The inferior vena cava is normal in size with greater than 50%  respiratory variability, suggesting right atrial pressure of 3 mmHg.   6. A small pericardial effusion is present.   Patient Profile     73 y.o. female with a hx of COPD, T2DM, hypothyroidism  who is being seen for the evaluation of acute systolic heart failure  Assessment & Plan    Acute combined heart failure:  Cardiac cath yesterday showed normal coronary arteries and normal filling pressures improving, now euvolemic  Echocardiogram 4/24 showed EF 25% with apical akinesis suggesting Takotsubo cardiomyopathy SCr stable at 0.65 and potassium 3.9 today Start farxiga 10mg  po qday.  BP stable at 128/89 heart rate in the low 100s in sinus tachycardia GDMT: Farxiga 10 mg daily Continue carvedilol 3.125mg  BID Start losartan  25 mg daily and transition to Entresto as an outpatient (patient had been on ACE inhibitor PTA) Add spironolactone 12.5 mg daily Heart rate likely being driven by underlying COPD exacerbation with steroids should improve as respiratory status improves  Acute hypoxic respiratory failure:  Due to pneumonia.   Remains on antibiotics.   Oxygen requirements have come down.   Also being treated for COPD exacerbation, currently on IV Solu-Medrol  and scheduled DuoNebs  Hypertension:  BP controlled  Continue carvedilol 3.125 mg twice daily PTA ACE inhibitor discontinued and will start on losartan  25 mg daily with plans for transition to Entresto as an outpatient  [facility] will sign off.   Medication Recommendations: Carvedilol 3.125 mg twice daily, Farxiga 10 mg daily, losartan  25 mg daily, spironolactone 12.5 mg daily Other recommendations (labs, testing, etc): Bmet in 1 week Follow up as an outpatient: TOC follow-up 7 to 10 days  [email or web address] spent 35 minutes caring for this patient today face to face, ordering and reviewing labs,  reviewing records from cardiac cath, 2D echo 06/2023  seeing the patient, documenting in the record, and arranging for followup outpt labs and followup  For questions or updates, please contact [facility] Please consult [email or web address] for contact info under    Signed, Gaylyn Keas, MD  07/10/2023, 9:03 AM

## 2023-07-10 NOTE — Discharge Summary (Signed)
 Physician Discharge Summary  Delayza Cowdrey WUJ:[id number] DOB: April 13, 1950 DOA: 07/03/2023  PCP: Hilton Lucky, MD  Admit date: 07/03/2023 Discharge date: 07/10/23  Admitted From: Home Disposition: Home Recommendations for Outpatient Follow-up:  Outpatient follow-up with PCP and cardiology as below Recommend ambulatory referral to pulmonology for PFT Check CBC with differential and CMP at follow-up Please follow up on the following pending results: None  Home Health: [facility] PT/OT Equipment/Devices: 3 and 1 commode  Discharge Condition: Stable CODE STATUS: Full code  Follow-up Information     Care, [facility] Follow up.   Specialty: Home Health Services Why: Someone will call you to schedule resumption of care order. Contact information: [address] [place] [address] [phone]         [facility]. Go in 8 day(s).   Specialty: Cardiology Why: Hospital follow up 07/17/2023 @ 2 pm PLEASE bring a current medication list to appointment FREE valet parking, Entrance C, off [employer] information: [address] [place]  [address] [phone]        Dettinger, Lucio Sabin, MD. Schedule an appointment as soon as possible for a visit in 1 week(s).   Specialties: Family Medicine, Cardiology Contact information: [address] [place] [address] [phone]                 Hospital course 73 y.o. female with PMH of COPD, DM-2, with neuropathy, HTN, chronic back pain, lumbar laminectomy, thoracic compression fraction, hypothyroidism, memory loss and osteoporosis presenting with progressive shortness of breath, DOE, wheezing, weakness and diaphoresis, and admitted with acute respiratory failure with hypoxia and hypercapnia in the setting of COPD exacerbation.  Reportedly saturating at 70% on room air when EMS arrived.  Wheezing with work of breathing as well.   In ED, she was started on BiPAP.  She desaturated to 86% when weaned to 6 L.  She was put back on BiPAP.    The next day, patient remained on BiPAP.  CT angio chest negative for PE but large RLL airspace opacity with small associated right pleural effusion and mild opacity in RUL consistent with pneumonia.  BNP trended up to 1300.  IV fluid discontinued.  Started on ceftriaxone  and Lasix .  TTE with LVEF of 25% and RWMA pattern suggesting Takotsubo cardiomyopathy or severe CAD.  Cardiology consulted.  Diuretics discontinued.    R/LHC on 07/09/2023 no significant finding.  Completed antibiotic course for pneumonia and steroid for COPD.  Liberated off oxygen.  On the day of discharge, ambulated on room air and maintained saturation above 91%.  Felt well and ready to go home.  She is cleared for discharge by cardiology on carvedilol, losartan , Aldactone and Farxiga from cardiac standpoint.  Cardiology to arrange outpatient follow-up.  From COPD standpoint, she is discharged on Anoro Ellipta and albuterol .  See individual problem list below for more.   Problems addressed during this hospitalization Acute respiratory failure with hypoxia and hypercapnia due to pneumonia, COPD and CHF: respiratory illness 2 weeks prior.  Reportedly saturating at 70% on RA when EMS arrived.  Desaturated to 86% when weaned off BiPAP to 6 L.  ABG 7.3/53/27.   COVID-19 and full RVP nonreactive.  BNP 18.6>>> 622> 1300.  CXR without infiltrate.  CT angio chest negative for PE but RLL and RUL infiltrate raising suspicion for aspiration pneumonia.  MRSA PCR screen negative.  TTE with acute CHF as below.  R/LHC negative.  Respiratory failure resolved. -Completed antibiotic course with ceftriaxone , Flagyl  and Zithromax  -Completed steroid course. - Discharged on an Anoro Ellipta and albuterol .   Acute combined CHF/stress cardiomyopathy: TTE with LVEF of 25%, RWMA and possible and normal RV function.  Patient appears euvolemic.  BNP  trended from 16-1300.  She only received minimal IV fluid on admission.  Briefly diuresed with IV Lasix .  R/LHC without significant finding.  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 - Outpatient follow-up with cardiology   Right lung pneumonia/COPD exacerbation:  -Management as above -Recommend ambulatory referral to pulmonology for PFT   Hyperglycemia: A1c 5.3%.  Not on medication.  Hyperglycemia likely due to steroid -Started on Farxiga per cardiology  Chronic back pain: Reports using ibuprofen  at home.  No longer on oxycodone  or Robaxin . -Tylenol  as needed -Advised to avoid NSAID   Mood disorder: Stable -Continue home meds   Hypothyroidism - Continue home Synthroid     Essential hypertension - Cardiac meds as above.   Hyponatremia/hypokalemia/hyperkalemia: Resolved.   Leukocytosis/bandemia: Likely demargination from steroid or leukemoid reaction.  She has no fever and appears well clinically now.  - Repeat CBC with differential in 1 to 2 weeks   Severe malnutrition Nutrition Problem: Severe Malnutrition Etiology: chronic illness Signs/Symptoms: energy intake < 75% for > or equal to 3 months, percent weight loss Percent weight loss: 13 % Interventions: Ensure Enlive (each supplement provides 350kcal and 20 grams of protein), MVI  Pressure skin injury: Present on admission Pressure Injury 07/04/23 Sacrum Right Stage 2 -  Partial thickness loss of dermis presenting as a shallow open injury with a red, pink wound bed without slough. dry (Active)  07/04/23 1353  Location: Sacrum  Location Orientation: Right  Staging: Stage 2 -  Partial thickness loss of dermis presenting as a shallow open injury with a red, pink wound bed without slough.  Wound Description (Comments): dry  Present on Admission: Yes  Dressing Type Foam - Lift dressing to assess site every shift 07/09/23 0800    Time spent 35 minutes  Vital  signs Vitals:   07/09/23 2316 07/09/23 2317 07/09/23 2318 07/09/23 2319  BP:   128/89   Pulse: 93 95 (!) 101 89  Temp:   (!) 97 F (36.1 C)   Resp:   18   Height:      Weight:      SpO2: 93% 96% 96% 96%  TempSrc:   Oral   BMI (Calculated):         Discharge exam  GENERAL: No apparent distress.  Nontoxic. HEENT: MMM.  Vision and hearing grossly intact.  NECK: Supple.  No apparent JVD.  RESP:  No IWOB.  Fair aeration bilaterally. CVS:  RRR. Heart sounds normal.  ABD/GI/GU: BS+. Abd soft, NTND.  MSK/EXT:  Moves extremities. No apparent deformity. No edema.  SKIN: no apparent skin lesion or wound NEURO: Awake and alert. Oriented appropriately.  No apparent focal neuro deficit. PSYCH: Calm. Normal affect.   Discharge Instructions Discharge Instructions     Diet - low sodium heart healthy   Complete by: As directed    Discharge instructions   Complete by: As directed    It has been a pleasure taking care of you!  You were hospitalized due to pneumonia, COPD exacerbation and heart failure.  You have completed antibiotic course for pneumonia.  We are discharging you on medications for COPD and heart failure.  It is very important that you take  your medications as prescribed.  Please review your new medication list and the directions on your medications before you take them.  Follow-up with your primary care doctor in 1 to 2 weeks or sooner if needed.  Follow-up with cardiology per their recommendation.  Avoid any over-the-counter pain medication other than plain Tylenol    Take care,   Discharge wound care:   Complete by: As directed    Pressure Injury 07/04/23 Sacrum Right Stage 2 -  Partial thickness loss of dermis presenting as a shallow open injury with a red, pink wound bed without slough. Dry  Recommend offloading and monitoring   Increase activity slowly   Complete by: As directed       Allergies as of 07/10/2023       Reactions   Aspirin Hives   Gabapentin  Other  (See Comments)   Dizzy,falls        Medication List     STOP taking these medications    ibuprofen  200 MG tablet Commonly known as: ADVIL    lisinopril  40 MG tablet Commonly known as: ZESTRIL    methocarbamol  500 MG tablet Commonly known as: ROBAXIN    oxyCODONE  5 MG immediate release tablet Commonly known as: Roxicodone        TAKE these medications    albuterol  108 (90 Base) MCG/ACT inhaler Commonly known as: VENTOLIN  HFA Inhale 2 puffs into the lungs every 4-6 hours as needed for wheezing or shortness of breath. What changed:  how much to take how to take this when to take this reasons to take this additional instructions   alendronate  70 MG tablet Commonly known as: FOSAMAX  TAKE 1 TABLET BY MOUTH WEEKLY  WITH 8 OZ OF PLAIN WATER 30  MINUTES BEFORE FIRST FOOD, DRINK OR MEDS. STAY UPRIGHT FOR 30  MINS What changed: See the new instructions.   amitriptyline  50 MG tablet Commonly known as: ELAVIL  TAKE 1 TABLET BY MOUTH AT  BEDTIME   atorvastatin  40 MG tablet Commonly known as: LIPITOR TAKE 1 TABLET BY MOUTH DAILY   calcium  carbonate 1500 (600 Ca) MG Tabs tablet Commonly known as: OSCAL Take 1 tablet (1,500 mg total) by mouth 2 (two) times daily with a meal.   carvedilol 3.125 MG tablet Commonly known as: COREG Take 1 tablet (3.125 mg total) by mouth 2 (two) times daily with a meal.   clopidogrel  75 MG tablet Commonly known as: PLAVIX  TAKE 1 TABLET BY MOUTH DAILY   dapagliflozin propanediol 10 MG Tabs tablet Commonly known as: FARXIGA Take 1 tablet (10 mg total) by mouth daily. Start taking on: July 11, 2023   donepezil  10 MG tablet Commonly known as: ARICEPT  Take 1 tablet (10 mg total) by mouth at bedtime.   DULoxetine  60 MG capsule Commonly known as: CYMBALTA  TAKE 1 CAPSULE BY MOUTH DAILY What changed: when to take this   feeding supplement Liqd Take 237 mLs by mouth 2 (two) times daily between meals.   levothyroxine  125 MCG  tablet Commonly known as: SYNTHROID  TAKE 1 TABLET BY MOUTH DAILY  BEFORE BREAKFAST   losartan  25 MG tablet Commonly known as: COZAAR  Take 1 tablet (25 mg total) by mouth daily. Start taking on: July 11, 2023   spironolactone 25 MG tablet Commonly known as: ALDACTONE Take 0.5 tablets (12.5 mg total) by mouth daily. Start taking on: July 11, 2023   triamcinolone  cream 0.1 % Commonly known as: KENALOG  Apply 1 application topically 2 (two) times daily. What changed:  when to take this reasons to take  this   umeclidinium-vilanterol 62.5-25 MCG/ACT Aepb Commonly known as: ANORO ELLIPTA Inhale 1 puff into the lungs daily. Start taking on: July 11, 2023               Durable Medical Equipment  (From admission, onward)           Start     Ordered   07/05/23 1336  For home use only DME Bedside commode  Once       Comments: Patient is confined to one room, has Generalized Weakness and Decreased Activity Tolerance which necessitate recommendation for bedside commode  Question:  Patient needs a bedside commode to treat with the following condition  Answer:  Weakness   07/05/23 1335              Discharge Care Instructions  (From admission, onward)           Start     Ordered   07/10/23 0000  Discharge wound care:       Comments: Pressure Injury 07/04/23 Sacrum Right Stage 2 -  Partial thickness loss of dermis presenting as a shallow open injury with a red, pink wound bed without slough. Dry  Recommend offloading and monitoring   07/10/23 0935            Consultations: Cardiology  Procedures/Studies:   CARDIAC CATHETERIZATION Result Date: 07/09/2023 HEMODYNAMICS: RA:       4 mmHg (mean) RV:       30/2, 4 mmHg PA:       30/16 mmHg (20 mean) PCWP: 12 mmHg (mean)    Estimated Fick CO/CI   5.03L/min, 2.84L/min/m2    TPG  8  mmHg     PVR  1.6 Wood Units PAPi  3.5  IMPRESSION: Left and right heart catheterization for evaluation of new systolic heart  failure. Normal right dominant coronary arteriography. 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 Continue medical therapy   ECHOCARDIOGRAM COMPLETE Result Date: 07/05/2023    ECHOCARDIOGRAM REPORT   Patient Name:   Laura Dalton Date of Exam: 07/05/2023 Medical Rec #:  [id number]         Height:       66.0 in Accession #:    [id number]        Weight:       141.5 lb Date of Birth:  1950-10-03          BSA:          1.727 m Patient Age:    72 years          BP:           101/64 mmHg Patient Gender: F                 HR:           114 bpm. Exam Location:  Inpatient Procedure: 2D Echo, Cardiac Doppler, Color Doppler and Intracardiac            Opacification Agent (Both Spectral and Color Flow Doppler were            utilized during procedure). Indications:    R00.9* Unspecified abnormalities of heart beat  History:        Patient has no prior history of Echocardiogram examinations.                 COPD, Signs/Symptoms:Shortness of Breath and Dyspnea; Risk  Factors:Diabetes, Hypertension and Dyslipidemia.  Sonographer:    Raynelle Callow RDCS Referring Phys: [id number] Ella Gun Ottis Vacha IMPRESSIONS  1. Left ventricular ejection fraction, by estimation, is 25%. The left ventricle has severely decreased function. The left ventricle demonstrates regional wall motion abnormalities with normal function of the LV basal segments and akinesis of the LV mid  to apical segments. This suggests either a stress (Takotsubo-type) cardiomyopathy or severe coronary disease. There is mild concentric left ventricular hypertrophy. Left ventricular diastolic parameters are indeterminate.  2. Right ventricular systolic function is normal. The right ventricular size is normal. Tricuspid regurgitation signal is inadequate for assessing PA pressure.  3. The mitral valve is normal in structure. No  evidence of mitral valve regurgitation. No evidence of mitral stenosis.  4. The aortic valve is tricuspid. There is mild calcification of the aortic valve. Aortic valve regurgitation is not visualized. No aortic stenosis is present.  5. The inferior vena cava is normal in size with greater than 50% respiratory variability, suggesting right atrial pressure of 3 mmHg.  6. A small pericardial effusion is present. FINDINGS  Left Ventricle: Left ventricular ejection fraction, by estimation, is 25%. The left ventricle has severely decreased function. The left ventricle demonstrates regional wall motion abnormalities. The left ventricular internal cavity size was normal in size. There is mild concentric left ventricular hypertrophy. Left ventricular diastolic parameters are indeterminate. Right Ventricle: The right ventricular size is normal. No increase in right ventricular wall thickness. Right ventricular systolic function is normal. Tricuspid regurgitation signal is inadequate for assessing PA pressure. Left Atrium: Left atrial size was normal in size. Right Atrium: Right atrial size was normal in size. Pericardium: A small pericardial effusion is present. Mitral Valve: The mitral valve is normal in structure. No evidence of mitral valve regurgitation. No evidence of mitral valve stenosis. Tricuspid Valve: The tricuspid valve is normal in structure. Tricuspid valve regurgitation is not demonstrated. Aortic Valve: The aortic valve is tricuspid. There is mild calcification of the aortic valve. Aortic valve regurgitation is not visualized. No aortic stenosis is present. Pulmonic Valve: The pulmonic valve was normal in structure. Pulmonic valve regurgitation is not visualized. Aorta: The aortic root is normal in size and structure. Venous: The inferior vena cava is normal in size with greater than 50% respiratory variability, suggesting right atrial pressure of 3 mmHg. IAS/Shunts: No atrial level shunt detected by color  flow Doppler.  LEFT VENTRICLE PLAX 2D LVIDd:         4.20 cm LVIDs:         2.80 cm LV PW:         1.50 cm LV IVS:        1.50 cm LVOT diam:     2.30 cm LV SV:         55 LV SV Index:   32 LVOT Area:     4.15 cm  LV Volumes (MOD) LV vol d, MOD A2C: 79.4 ml LV vol d, MOD A4C: 85.7 ml LV vol s, MOD A2C: 47.4 ml LV vol s, MOD A4C: 63.6 ml LV SV MOD A2C:     32.0 ml LV SV MOD A4C:     85.7 ml LV SV MOD BP:      30.2 ml RIGHT VENTRICLE             IVC RV S prime:     13.20 cm/s  IVC diam: 1.90 cm TAPSE (M-mode): 1.3 cm LEFT ATRIUM  Index        RIGHT ATRIUM          Index LA diam:        3.10 cm 1.80 cm/m   RA Area:     8.43 cm LA Vol (A2C):   26.9 ml 15.58 ml/m  RA Volume:   14.10 ml 8.17 ml/m LA Vol (A4C):   14.4 ml 8.34 ml/m LA Biplane Vol: 19.5 ml 11.29 ml/m  AORTIC VALVE LVOT Vmax:   91.50 cm/s LVOT Vmean:  62.600 cm/s LVOT VTI:    0.133 m  AORTA Ao Root diam: 2.90 cm Ao Asc diam:  3.20 cm MITRAL VALVE MV Area (PHT): 5.97 cm     SHUNTS MV Decel Time: 127 msec     Systemic VTI:  0.13 m MV E velocity: 142.00 cm/s  Systemic Diam: 2.30 cm Dalton McleanMD Electronically signed by Archer Bear Signature Date/Time: 07/05/2023/2:42:25 PM    Final    CT Angio Chest Pulmonary Embolism (PE) W or WO Contrast Result Date: 07/04/2023 CLINICAL DATA:  Shortness of breath, chest pain. EXAM: CT ANGIOGRAPHY CHEST WITH CONTRAST TECHNIQUE: Multidetector CT imaging of the chest was performed using the standard protocol during bolus administration of intravenous contrast. Multiplanar CT image reconstructions and MIPs were obtained to evaluate the vascular anatomy. RADIATION DOSE REDUCTION: This exam was performed according to the departmental dose-optimization program which includes automated exposure control, adjustment of the mA and/or kV according to patient size and/or use of iterative reconstruction technique. CONTRAST:  75mL OMNIPAQUE  IOHEXOL  350 MG/ML SOLN COMPARISON:  Aug 02, 2015. FINDINGS: Cardiovascular:  Satisfactory opacification of the pulmonary arteries to the segmental level. No evidence of pulmonary embolism. Normal heart size. No pericardial effusion. Mediastinum/Nodes: No enlarged mediastinal, hilar, or axillary lymph nodes. Thyroid  gland, trachea, and esophagus demonstrate no significant findings. Lungs/Pleura: No pneumothorax is noted. Mild emphysematous disease is noted in the upper lobes. Large right lower lobe airspace opacity is noted consistent with pneumonia with small associated pleural effusion. Minimal left basilar subsegmental atelectasis is noted. Mild opacity is noted posteriorly in right upper lobe most consistent with pneumonia as well. Upper Abdomen: Probable small right renal calculus. Musculoskeletal: No chest wall abnormality. No acute or significant osseous findings. Review of the MIP images confirms the above findings. IMPRESSION: No definite evidence of pulmonary embolus. Large right lower lobe airspace opacity is noted most consistent with pneumonia with small associated right pleural effusion. Mild opacity is noted posteriorly in right upper lobe most consistent with pneumonia as well. Probable small right renal calculus. Aortic Atherosclerosis (ICD10-I70.0) and Emphysema (ICD10-J43.9). Electronically Signed   By: Rosalene Colon M.D.   On: 07/04/2023 13:27   DG Chest Port 1 View Result Date: 07/04/2023 CLINICAL DATA:  Hypoxemia. EXAM: PORTABLE CHEST 1 VIEW COMPARISON:  07/03/2023 FINDINGS: Heart size appears normal scratch set stable cardiomediastinal contours. Aortic atherosclerosis. New small right pleural effusion. Diffuse increase interstitial markings with septal thickening is new from the previous exam. Decreased aeration to the right base may reflect posterior layering effusion and or atelectasis. IMPRESSION: 1. Suspect mild congestive heart failure, new from previous exam. 2. Decreased aeration to right base may reflect atelectasis and/or effusion. Electronically Signed    By: Kimberley Penman M.D.   On: 07/04/2023 08:06   DG Chest Port 1 View Result Date: 07/03/2023 CLINICAL DATA:  Shortness of breath EXAM: PORTABLE CHEST 1 VIEW COMPARISON:  December 13, 2022 FINDINGS: The heart size and mediastinal contours are within normal limits. Both lungs  are clear. The visualized skeletal structures are unremarkable. IMPRESSION: No active disease. Electronically Signed   By: Fredrich Jefferson M.D.   On: 07/03/2023 14:27       The results of significant diagnostics from this hospitalization (including imaging, microbiology, ancillary and laboratory) are listed below for reference.     Microbiology: Recent Results (from the past 240 hours)  Resp panel by RT-PCR (RSV, Flu A&B, Covid) Anterior Nasal Swab     Status: None   Collection Time: 07/03/23  1:11 PM   Specimen: Anterior Nasal Swab  Result Value Ref Range Status   SARS Coronavirus 2 by RT PCR NEGATIVE NEGATIVE Final   Influenza A by PCR NEGATIVE NEGATIVE Final   Influenza B by PCR NEGATIVE NEGATIVE Final    Comment: (NOTE) The Xpert Xpress SARS-CoV-2/FLU/RSV plus assay is intended as an aid in the diagnosis of influenza from Nasopharyngeal swab specimens and should not be used as a sole basis for treatment. Nasal washings and aspirates are unacceptable for Xpert Xpress SARS-CoV-2/FLU/RSV testing.  Fact Sheet for Patients: BloggerCourse.com  Fact Sheet for Healthcare Providers: SeriousBroker.it  This test is not yet approved or cleared by the [place]  FDA and has been authorized for detection and/or diagnosis of SARS-CoV-2 by FDA under an Emergency Use Authorization (EUA). This EUA will remain in effect (meaning this test can be used) for the duration of the COVID-19 declaration under Section 564(b)(1) of the Act, 21 U.S.C. section 360bbb-3(b)(1), unless the authorization is terminated or revoked.     Resp Syncytial Virus by PCR NEGATIVE NEGATIVE Final     Comment: (NOTE) Fact Sheet for Patients: BloggerCourse.com  Fact Sheet for Healthcare Providers: SeriousBroker.it  This test is not yet approved or cleared by the [place]  FDA and has been authorized for detection and/or diagnosis of SARS-CoV-2 by FDA under an Emergency Use Authorization (EUA). This EUA will remain in effect (meaning this test can be used) for the duration of the COVID-19 declaration under Section 564(b)(1) of the Act, 21 U.S.C. section 360bbb-3(b)(1), unless the authorization is terminated or revoked.  Performed at [facility], [address], [place], [place] [address]   Respiratory (~20 pathogens) panel by PCR     Status: None   Collection Time: 07/03/23  3:49 PM   Specimen: Nasopharyngeal Swab; Respiratory  Result Value Ref Range Status   Adenovirus NOT DETECTED NOT DETECTED Final   Coronavirus 229E NOT DETECTED NOT DETECTED Final    Comment: (NOTE) The Coronavirus on the Respiratory Panel, DOES NOT test for the novel  Coronavirus (2019 nCoV)    Coronavirus HKU1 NOT DETECTED NOT DETECTED Final   Coronavirus NL63 NOT DETECTED NOT DETECTED Final   Coronavirus OC43 NOT DETECTED NOT DETECTED Final   Metapneumovirus NOT DETECTED NOT DETECTED Final   Rhinovirus / Enterovirus NOT DETECTED NOT DETECTED Final   Influenza A NOT DETECTED NOT DETECTED Final   Influenza B NOT DETECTED NOT DETECTED Final   Parainfluenza Virus 1 NOT DETECTED NOT DETECTED Final   Parainfluenza Virus 2 NOT DETECTED NOT DETECTED Final   Parainfluenza Virus 3 NOT DETECTED NOT DETECTED Final   Parainfluenza Virus 4 NOT DETECTED NOT DETECTED Final   Respiratory Syncytial Virus NOT DETECTED NOT DETECTED Final   Bordetella pertussis NOT DETECTED NOT DETECTED Final   Bordetella Parapertussis NOT DETECTED NOT DETECTED Final   Chlamydophila pneumoniae NOT DETECTED NOT DETECTED Final   Mycoplasma pneumoniae NOT DETECTED NOT DETECTED  Final    Comment: Performed at [facility], [address], [place], [place] [address]  MRSA Next Gen by PCR, Nasal     Status: None   Collection Time: 07/04/23  1:57 PM   Specimen: Urine, Clean Catch; Nasal Swab  Result Value Ref Range Status   MRSA by PCR Next Gen NOT DETECTED NOT DETECTED Final    Comment: (NOTE) The GeneXpert MRSA Assay (FDA approved for NASAL specimens only), is one component of a comprehensive MRSA colonization surveillance program. It is not intended to diagnose MRSA infection nor to guide or monitor treatment for MRSA infections. Test performance is not FDA approved in patients less than 19 years old. Performed at [facility], [address] [place], [place] [address]      Labs:  CBC: Recent Labs  Lab 07/03/23 1318 07/03/23 1531 07/06/23 0349 07/07/23 0332 07/08/23 0310 07/09/23 0904 07/09/23 0908 07/09/23 1054 07/10/23 0654  WBC 20.7*   < > 24.8* 18.9* 17.5*  --   --  21.1* 23.8*  NEUTROABS 16.1*  --  20.7*  --   --   --   --   --   --   HGB 13.4   < > 12.6 12.0 12.3 11.9* 12.9  12.6 12.9 12.3  HCT 42.8   < > 38.7 38.0 37.8 35.0* 38.0  37.0 40.2 37.3  MCV 86.8   < > 85.1 85.0 84.4  --   --  85.5 83.1  PLT 448*   < > 290 283 257  --   --  301 327   < > = values in this interval not displayed.   BMP &GFR Recent Labs  Lab 07/05/23 0836 07/06/23 0349 07/07/23 0332 07/08/23 0310 07/09/23 0904 07/09/23 0908 07/09/23 1054 07/10/23 0654 07/10/23 0655  NA 141 136 134* 135 138 136  136 135  --  135  K 3.2* 4.6 4.2 4.3 3.3* 3.6  3.6 3.6  --  3.9  CL 96* 99 100 99  --   --  99  --  101  CO2 27 27 24 25   --   --  26  --  24  GLUCOSE 149* 119* 133* 130*  --   --  98  --  105*  BUN 13 12 12 12   --   --  13  --  15  CREATININE 0.73 0.71 0.58 0.68  --   --  0.64 0.70 0.65  CALCIUM  9.7 8.8* 8.8* 8.9  --   --  8.8*  --  8.7*  MG 1.9 1.9 2.1 2.0  --   --   --  2.2  --   PHOS 2.4* 2.3* 2.4* 3.4  --   --   --   --  4.1   Estimated  Creatinine Clearance: 58.6 mL/min (by C-G formula based on SCr of 0.65 mg/dL). Liver & Pancreas: Recent Labs  Lab 07/03/23 1318 07/05/23 0836 07/06/23 0349 07/07/23 0332 07/08/23 0310 07/10/23 0655  AST 24  --   --   --   --   --   ALT 9  --   --   --   --   --   ALKPHOS 80  --   --   --   --   --   BILITOT 0.6  --   --   --   --   --   PROT 6.8  --   --   --   --   --   ALBUMIN 3.9  3.3* 3.1* 3.0* 3.0* 2.9*   No results for input(s): "LIPASE", "AMYLASE" in the last 168 hours. No results for input(s): "AMMONIA" in the last 168 hours. Diabetic: No results for input(s): "HGBA1C" in the last 72 hours. Recent Labs  Lab 07/09/23 0644 07/09/23 1129 07/09/23 1546 07/09/23 2047 07/10/23 0558  GLUCAP 100* 105* 173* 156* 110*   Cardiac Enzymes: No results for input(s): "CKTOTAL", "CKMB", "CKMBINDEX", "TROPONINI" in the last 168 hours. No results for input(s): "PROBNP" in the last 8760 hours. Coagulation Profile: No results for input(s): "INR", "PROTIME" in the last 168 hours. Thyroid  Function Tests: No results for input(s): "TSH", "T4TOTAL", "FREET4", "T3FREE", "THYROIDAB" in the last 72 hours. Lipid Profile: No results for input(s): "CHOL", "HDL", "LDLCALC", "TRIG", "CHOLHDL", "LDLDIRECT" in the last 72 hours. Anemia Panel: No results for input(s): "VITAMINB12", "FOLATE", "FERRITIN", "TIBC", "IRON", "RETICCTPCT" in the last 72 hours. Urine analysis:    Component Value Date/Time   COLORURINE YELLOW 12/11/2022 1742   APPEARANCEUR CLEAR 12/11/2022 1742   APPEARANCEUR Clear 07/28/2021 1634   LABSPEC 1.010 12/11/2022 1742   PHURINE 6.0 12/11/2022 1742   GLUCOSEU NEGATIVE 12/11/2022 1742   HGBUR SMALL (A) 12/11/2022 1742   BILIRUBINUR NEGATIVE 12/11/2022 1742   BILIRUBINUR Negative 07/28/2021 1634   KETONESUR NEGATIVE 12/11/2022 1742   PROTEINUR NEGATIVE 12/11/2022 1742   UROBILINOGEN negative 04/30/2015 1313   NITRITE NEGATIVE 12/11/2022 1742   LEUKOCYTESUR NEGATIVE  12/11/2022 1742   Sepsis Labs: Invalid input(s): "PROCALCITONIN", "LACTICIDVEN"   SIGNED:  Theadore Finger, MD  [facility] 07/10/2023, 12:15 PM

## 2023-07-10 NOTE — Plan of Care (Signed)
  Problem: Education: Goal: Ability to describe self-care measures that may prevent or decrease complications (Diabetes Survival Skills Education) will improve Outcome: Progressing Goal: Individualized Educational Video(s) Outcome: Progressing   Problem: Coping: Goal: Ability to adjust to condition or change in health will improve Outcome: Progressing   Problem: Fluid Volume: Goal: Ability to maintain a balanced intake and output will improve Outcome: Progressing   Problem: Health Behavior/Discharge Planning: Goal: Ability to identify and utilize available resources and services will improve Outcome: Progressing Goal: Ability to manage health-related needs will improve Outcome: Progressing   Problem: Metabolic: Goal: Ability to maintain appropriate glucose levels will improve Outcome: Progressing   Problem: Nutritional: Goal: Maintenance of adequate nutrition will improve Outcome: Progressing   Problem: Education: Goal: Knowledge of General Education information will improve Description: Including pain rating scale, medication(s)/side effects and non-pharmacologic comfort measures Outcome: Progressing   Problem: Clinical Measurements: Goal: Ability to maintain clinical measurements within normal limits will improve Outcome: Progressing

## 2023-07-10 NOTE — TOC Transition Note (Addendum)
 Transition of Care [facility]) - Discharge Note   Patient Details  Name: Laura Dalton MRN: [id number] Date of Birth: [birth year]  Transition of [facility]) CM/SW Contact:  Jennett Model, RN Phone Number: 07/10/2023, 10:11 AM   Clinical Narrative:     For dc today, NCM notified Cory with [facility] of the dc.  She has transport home.  Staff RN checking ambulatory sats.         Patient Goals and CMS Choice            Discharge Placement                       Discharge Plan and Services Additional resources added to the After Visit Summary for                                       Social Drivers of Health (SDOH) Interventions SDOH Screenings   Food Insecurity: No Food Insecurity (07/04/2023)  Housing: Low Risk  (07/09/2023)  Transportation Needs: No Transportation Needs (07/09/2023)  Utilities: Not At Risk (07/04/2023)  Alcohol  Screen: Low Risk  (07/09/2023)  Depression (PHQ2-9): Low Risk  (06/21/2023)  Financial Resource Strain: Low Risk  (07/09/2023)  Physical Activity: Inactive (01/31/2023)  Social Connections: Moderately Integrated (07/04/2023)  Stress: No Stress Concern Present (06/21/2023)  Tobacco Use: Medium Risk (07/03/2023)  Health Literacy: Adequate Health Literacy (06/21/2023)     Readmission Risk Interventions    07/05/2023    1:16 PM 06/02/2023    1:39 PM 06/01/2023    1:04 PM  Readmission Risk Prevention Plan  Post Dischage Appt Complete    Medication Screening Complete Complete Complete  Transportation Screening Complete Complete Complete

## 2023-07-10 NOTE — Progress Notes (Signed)
 Heart Failure Stewardship Pharmacist Progress Note   PCP: Dettinger, Lucio Sabin, MD PCP-Cardiologist: Armida Lander, MD    HPI:  73 yo F with PMH of COPD, DM-2, with neuropathy, HTN, chronic back pain, lumbar laminectomy, thoracic compression fraction, hypothyroidism, memory loss and osteoporosis   Presented to the ED on 4/22 for with hypoxia, worsening SOB, and worsening weakness that began about 5 days prior to admission when she tried leaving her house. Patient also found to be diaphoretic with wheezing on presentation. Had 1 episode of non-blooody emesis. Patient received steroids and bronchodilators in route.   In the ED, patient was tachycardic, hypoxic with rhonchi present. Patient placed on BiPAP (did not tolerate [name]). Patient admitted to having a viral respiratory infection 2 weeks prior to admission, with symptoms of chest congestion, cough, and SOB but deferred medical care. Patient received CRO, azithromycin , and IV fluids in the ED. No JVD present. CXR on 4/22 showed heart size within normal limits and no active disease. CXR 4/23 showed new small right pleural effusion and new diffuse increased interstitial markings with septal thickening suggestive of mild congestive HF. CT chest 4/23 showed large R lower lobe and mild upper lobe airspace opacity consistent with PNA. ECHO 4/24 showed LVEF 25% (60-65% 07/2015) )with severely decreased function of LV and regional wall motion abnormalities. Akinesis of the LV mid to apical segments, suggesting Takotsubo-type cardiomyopathy or severe CAD. Mild concentric LV hypertrophy present. Normal RV systolic function. MV normal. Mild calcification of the aortic valve. Small pericardial effusion present. Cardiology consulted. IV diuretics discontinued due to euvolemic on exam. Planned R/LHC on 4/28 showed RA 4, PA 20, PCWP 12, Fick CO/CI 5.03/2.84. Presumed stress induced cardiomyopathy.  Denies SOB, orthopnea. No BL leg swelling present on exam. No  complaints noted. Patient was made aware of discharge medications. Notes that she may have a blood pressure cuff at home but will purchase one if not. Counseled on checking home BP at least 2 hours after taking medications. Patient agreeable with plan.    Discharge HF medications: Carvedilol 3.125mg  BID  Farxiga 10mg  daily  Losartan  25mg  daily  Spironolactone 12.5mg  daily    Current HF Medications: Beta Blocker: Carvedilol 3.125mg  BID  SGLT2i: Farxiga 10mg  daily   Prior to admission HF Medications: ACE/ARB/ARNI: lisinopril  40mg  daily   Pertinent Lab Values: Serum creatinine 0.68>0.64>0.65, BUN 12>13, Potassium 4.3>3.6>3.9, Sodium 135, BNP 1348.5, Magnesium  2.2, A1c 5.3 (07/03/23)   Vital Signs: Weight: 150 (4/27) lbs (admission weight: 149 lbs) Blood pressure: 90-120s/60-70s Heart rate: 70-90s  I/O: net - yesterday; net +1.9L since admission O2: BiPAP > 10L HFNC > 2LNC > RA  Medication Assistance / Insurance Benefits Check: Does the patient have prescription insurance?  Yes Type of insurance plan: Medicare   Outpatient Pharmacy:  Prior to admission outpatient pharmacy: [name], [place]  Is the patient willing to use [facility] TOC pharmacy at discharge? Yes Is the patient willing to transition their outpatient pharmacy to utilize a [facility] outpatient pharmacy? Yes     Assessment: 1. 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 NYHA class 1 symptoms. Strict I/Os and daily weights. Keep K>4 and Mg>2.  - Continue Coreg 3.125mg  BID  - Continue Farxiga 10mg  daily  - Consider adding Spironolactone 12.5mg  daily  - Consider GDMT titration with Entresto 24/26 mg bid upon discharge. Can consider starting valsartan 20mg  BID if cannot tolerate entresto due to low BP.    Plan: 1) Medication changes recommended at this time: -  Start Spironolactone 12.5mg  daily   2) Patient assistance: - Patient is interested in [facility] pharmacy for discharge  medications and [facility] pharmacy mail order service.  - Entresto, farxiga, and jardiance copay -  $182.14 remaining on deductible. Reported that she can afford the once payment of $182.14, followed by the monthly copay of ~$45. - Contacted [facility] pharmacy for profile transfer to [facility] outpatient pharmacy  3)  Education  - Patient has been educated on current HF medications and potential additions to HF medication regimen - Patient verbalizes understanding that over the next few months, these medication doses may change and more medications may be added to optimize HF regimen - Patient has been educated on basic disease state pathophysiology and goals of therapy    [name], PharmD PGY1 Pharmacy Resident

## 2023-07-11 ENCOUNTER — Telehealth: Payer: Self-pay | Admitting: *Deleted

## 2023-07-11 ENCOUNTER — Other Ambulatory Visit: Payer: Self-pay | Admitting: *Deleted

## 2023-07-11 ENCOUNTER — Encounter: Payer: Self-pay | Admitting: *Deleted

## 2023-07-11 ENCOUNTER — Other Ambulatory Visit (HOSPITAL_COMMUNITY): Payer: Self-pay

## 2023-07-11 MED FILL — Atorvastatin Calcium Tab 40 MG (Base Equivalent): ORAL | 70 days supply | Qty: 70 | Fill #0 | Status: CN

## 2023-07-11 NOTE — Patient Instructions (Signed)
 Visit Information  Thank you for taking time to visit with me today. Please don't hesitate to contact me if I can be of assistance to you before our next scheduled appointment.  Your next care management appointment is by telephone on 08-10-2023 at 11:45 am  Telephone follow-up in 1 month  Please call the care guide team at [phone] if you need to cancel, schedule, or reschedule an appointment.   Please call the Suicide and Crisis Lifeline: 988 call the [place]  National Suicide Prevention Lifeline: (732) 086-2616 or TTY: 770-427-8294 TTY (785)353-1995) to talk to a trained counselor call 1-800-273-TALK (toll free, 24 hour hotline) call the [facility]: [phone] call 911 if you are experiencing a Mental Health or Behavioral Health Crisis or need someone to talk to.  Grandville Lax, BSN RN [place]  [facility], [facility] RN Care Manager Direct Dial: [phone]  Fax: [phone]   COPD Action Plan A COPD action plan is a description of what to do when you have a flare (exacerbation) of chronic obstructive pulmonary disease (COPD). Your action plan is a color-coded plan that lists the symptoms that indicate whether your condition is under control and what actions to take. If you have symptoms in the green zone, it means you are doing well that day. If you have symptoms in the yellow zone, it means you are having a bad day or an exacerbation. If you have symptoms in the red zone, you need urgent medical care. Follow the plan that you and your health care provider developed. Review your plan with your health care provider at each visit. Red zone Symptoms in this zone mean that you should get medical help right away. They include: Feeling very short of breath, even when you are resting. Not being able to do any activities because of poor breathing. Not being able to sleep because of poor breathing. Fever or shaking chills. Feeling  confused or very sleepy. Chest pain. Coughing up blood. If you have any of these symptoms, call emergency services (911 in the [place] or go to the nearest emergency room. Yellow zone Symptoms in this zone mean that your condition may be getting worse. They include: Feeling more short of breath than usual. Having less energy for daily activities than usual. Phlegm or mucus that is thicker than usual. Needing to use your rescue inhaler or nebulizer more often than usual. More ankle swelling than usual. Coughing more than usual. Feeling like you have a chest cold. Trouble sleeping due to COPD symptoms. Decreased appetite. COPD medicines not helping as much as usual. If you experience any "yellow" symptoms: Keep taking your daily medicines as directed. Use your quick-relief inhaler as told by your health care provider. If you were prescribed steroid medicine to take by mouth (oral medicine), start taking it as told by your health care provider. If you were prescribed an antibiotic medicine, start taking it as told by your health care provider. Do not stop taking the antibiotic even if you start to feel better. Use oxygen as told by your health care provider. Get more rest. Do your pursed-lip breathing exercises. Do not smoke. Avoid any irritants in the air. If your signs and symptoms do not improve after taking these steps, call your health care provider right away. Green zone Symptoms in this zone mean that you are doing well. They include: Being able to do your usual activities and exercise. Having the usual amount of coughing, including the same amount of phlegm or  mucus. Being able to sleep well. Having a good appetite. Where to find more information: You can find more information about COPD from: American Lung Association, My COPD Action Plan: www.lung.org COPD Foundation: www.copdfoundation.org National Heart, Lung, & Blood Institute: PopSteam.is Follow these instructions  at home: Continue taking your daily medicines as told by your health care provider. Make sure you receive all the immunizations that your health care provider recommends, especially the pneumococcal and influenza vaccines. Wash your hands often with soap and water. Have family members wash their hands too. Regular hand washing can help prevent infections. Follow your usual exercise and diet plan. Avoid irritants in the air, such as smoke. Do not use any products that contain nicotine or tobacco. These products include cigarettes, chewing tobacco, and vaping devices, such as e-cigarettes. If you need help quitting, ask your health care provider. Summary A COPD action plan tells you what to do when you have a flare (exacerbation) of chronic obstructive pulmonary disease (COPD). Follow each action plan for your symptoms. If you have any symptoms in the red zone, call emergency services (911 in the [place] or go to the nearest emergency room. This information is not intended to replace advice given to you by your health care provider. Make sure you discuss any questions you have with your health care provider. Document Revised: 01/11/2023 Document Reviewed: 01/11/2023 Elsevier Patient Education  2024 [employer].

## 2023-07-11 NOTE — Transitions of Care (Post Inpatient/ED Visit) (Signed)
 07/11/2023  Name: Laura Dalton MRN: [id number] DOB: 23-Jan-1951  Today's TOC FU Call Status: Today's TOC FU Call Status:: Successful TOC FU Call Completed TOC FU Call Complete Date: 07/11/23 Patient's Name and Date of Birth confirmed.  Transition Care Management Follow-up Telephone Call Date of Discharge: 07/10/23 Discharge Facility: [facility]) Type of Discharge: Inpatient Admission Primary Inpatient Discharge Diagnosis:: Respiratory distress How have you been since you were released from the hospital?: Better (pt states "feeling better, still a little weak, using walker for ambulation, appetite "fair", drinking fluids and ensure, breathing "fine") Any questions or concerns?: No  Items Reviewed: Did you receive and understand the discharge instructions provided?: Yes Medications obtained,verified, and reconciled?: Yes (Medications Reviewed) Any new allergies since your discharge?: No Dietary orders reviewed?: Yes Type of Diet Ordered:: heart healthy   low sodium Do you have support at home?: Yes People in Home [RPT]: alone, child(ren), adult Name of Support/Comfort Primary Source: adult son checks in on patient Patient declines enrollment in [facility] 30 day program, would like to continue working with longitudinal RN CM, wants to keep today's 2 pm scheduled appointment with Grandville Lax RN, RN CM sent Grandville Lax RN update Reviewed with patient she is to be seen by pulmonary for PFT, if she does not hear anything in the next 1-2 weeks, let primary care provider or RN CM know  Medications Reviewed Today: Medications Reviewed Today     Reviewed by Daralyn Earl, RN (Registered Nurse) on 07/11/23 at 1123  Med List Status: <None>   Medication Order Taking? Sig Documenting Provider Last Dose Status Informant  albuterol  (VENTOLIN  HFA) 108 (90 Base) MCG/ACT inhaler [id number] Yes Inhale 2 puffs into the lungs every 4-6 hours as needed for wheezing or shortness of breath. Gonfa,  Taye T, MD Taking Active   alendronate  (FOSAMAX ) 70 MG tablet [id number] Yes TAKE 1 TABLET BY MOUTH WEEKLY  WITH 8 OZ OF PLAIN WATER 30  MINUTES BEFORE FIRST FOOD, DRINK OR MEDS. STAY UPRIGHT FOR 30  MINS Dettinger, Lucio Sabin, MD Taking Active Self, Pharmacy Records, Multiple Informants, Care Giver  amitriptyline  (ELAVIL ) 50 MG tablet [id number] Yes Take 1 tablet (50 mg total) by mouth at bedtime. Dettinger, Lucio Sabin, MD Taking Active Self, Pharmacy Records, Multiple Informants, Care Giver  atorvastatin  (LIPITOR) 40 MG tablet [id number] Yes Take 1 tablet (40 mg total) by mouth daily. Dettinger, Lucio Sabin, MD Taking Active Self, Pharmacy Records, Multiple Informants, Care Giver  calcium  carbonate (OSCAL) 1500 (600 Ca) MG TABS tablet [id number] Yes Take 1 tablet (1,500 mg total) by mouth 2 (two) times daily with a meal. Doreene Gammon D, DO Taking Active Self, Pharmacy Records, Multiple Informants, Care Giver  carvedilol (COREG) 3.125 MG tablet [id number] Yes Take 1 tablet (3.125 mg total) by mouth 2 (two) times daily with a meal. Theadore Finger, MD Taking Active   clopidogrel  (PLAVIX ) 75 MG tablet [id number] Yes Take 1 tablet (75 mg total) by mouth daily. Dettinger, Lucio Sabin, MD Taking Active Self, Pharmacy Records, Multiple Informants, Care Giver  dapagliflozin propanediol (FARXIGA) 10 MG TABS tablet [id number] Yes Take 1 tablet (10 mg total) by mouth daily. Gonfa, Taye T, MD Taking Active   donepezil  (ARICEPT ) 10 MG tablet [id number] No Take 1 tablet (10 mg total) by mouth at bedtime.  Patient not taking: Reported on 07/04/2023   Dettinger, Lucio Sabin, MD Not Taking Active Self, Pharmacy Records, Multiple Informants, Care Giver  Med Note Baltazar Leventhal, [place]   Wed Jul 04, 2023  1:52 PM)    donepezil  (ARICEPT ) 10 MG tablet [id number] No Take 1 tablet (10 mg total) by mouth at bedtime.  Patient not taking: Reported on 07/11/2023   Dettinger, Lucio Sabin, MD Not Taking Active   DULoxetine  (CYMBALTA ) 60 MG capsule  [id number] Yes Take 1 capsule (60 mg total) by mouth daily. Dettinger, Lucio Sabin, MD Taking Active Self, Pharmacy Records, Multiple Informants, Care Giver           Med Note Nanine Babcock, Elmo D   Wed Jul 04, 2023  7:37 AM)    feeding supplement (ENSURE ENLIVE / ENSURE PLUS) LIQD [id number] Yes Take 237 mLs by mouth 2 (two) times daily between meals. Gonfa, Taye T, MD Taking Active   levothyroxine  (SYNTHROID ) 125 MCG tablet [id number] Yes Take 1 tablet (125 mcg total) by mouth daily before breakfast. Dettinger, Lucio Sabin, MD Taking Active Self, Pharmacy Records, Multiple Informants, Care Giver  lisinopril  (ZESTRIL ) 40 MG tablet [id number] Yes Take 1 tablet (40 mg total) by mouth daily. Dettinger, Lucio Sabin, MD Taking Active   losartan  (COZAAR ) 25 MG tablet [id number] Yes Take 1 tablet (25 mg total) by mouth daily. Gonfa, Taye T, MD Taking Active   spironolactone (ALDACTONE) 25 MG tablet [id number] Yes Take 0.5 tablets (12.5 mg total) by mouth daily. Gonfa, Taye T, MD Taking Active   triamcinolone  cream (KENALOG ) 0.1 % [id number] Yes Apply 1 application topically 2 (two) times daily.  Patient taking differently: Apply 1 application  topically 2 (two) times daily as needed (Dermatitis).   Dettinger, Lucio Sabin, MD Taking Active Self, Pharmacy Records, Multiple Informants, Care Giver  umeclidinium-vilanterol [facility] ELLIPTA) 62.5-25 MCG/ACT AEPB [id number] Yes Inhale 1 puff into the lungs daily. Gonfa, Taye T, MD Taking Active             Home Care and Equipment/Supplies: Were Home Health Services Ordered?: Yes Name of Home Health Agency:: [facility] Has Agency set up a time to come to your home?: No (spoke with Shelvy Dickens at [facility], they will resume care on 07/13/23, pt verbalizes understanding) EMR reviewed for Home Health Orders: Orders present/patient has not received call (refer to CM for follow-up) Any new equipment or medical supplies ordered?: Yes Name of Medical supply agency?: pt states she  already had [facility] before hospitalization Were you able to get the equipment/medical supplies?: Yes Do you have any questions related to the use of the equipment/supplies?: No  Functional Questionnaire: Do you need assistance with bathing/showering or dressing?: No Do you need assistance with meal preparation?: No Do you need assistance with eating?: No Do you have difficulty maintaining continence: Yes (wears pads) Do you need assistance with getting out of bed/getting out of a chair/moving?: Yes (walker) Do you have difficulty managing or taking your medications?: No  Follow up appointments reviewed: PCP Follow-up appointment confirmed?: Yes Date of PCP follow-up appointment?:  (collaborated with care guide and scheduled post hospital follwo up appointment, pt verbalizes understanding) Follow-up Provider: Karon Packer NP @ 11 am Do you need transportation to your follow-up appointment?:  (states son and friend will provide transportation) Do you understand care options if your condition(s) worsen?: Yes-patient verbalized understanding  SDOH Interventions Today    Flowsheet Row Most Recent Value  SDOH Interventions   Food Insecurity Interventions Intervention Not Indicated  Housing Interventions Intervention Not Indicated  Transportation Interventions Intervention Not Indicated  Utilities Interventions Intervention Not Indicated  Cecilie Coffee [facility], BSN RN Care Manager/ Transition of Care [place]/ [facility] [phone]

## 2023-07-11 NOTE — Patient Outreach (Signed)
 Complex Care Management   Visit Note  07/11/2023  Name:  Laura Dalton MRN: [id number] DOB: 1951/03/10  Situation: Referral received for Complex Care Management related to COPD I obtained verbal consent from Patient.  Visit completed with patient  on the phone  Background:   Past Medical History:  Diagnosis Date   Bursitis of hip    Diabetes mellitus without complication (HCC)    type II   Hyperlipidemia    Hypertension    Hypothyroidism    Low back pain    benign turmor back   Stroke (HCC)    Vitamin D  deficiency disease     Assessment: Patient Reported Symptoms:  Cognitive Cognitive Status: Alert and oriented to person, place, and time, Normal speech and language skills, Able to follow simple commands Cognitive/Intellectual Conditions Management [RPT]: None reported or documented in medical history or problem list   Health Maintenance Behaviors: Annual physical exam Healing Pattern: Average Health Facilitated by: Rest  Neurological Neurological Review of Symptoms: No symptoms reported Neurological Management Strategies: Routine screening Neurological Self-Management Outcome: 4 (good)  HEENT HEENT Symptoms Reported: No symptoms reported HEENT Management Strategies: Routine screening HEENT Self-Management Outcome: 4 (good)    Cardiovascular Cardiovascular Symptoms Reported: No symptoms reported Cardiovascular Management Strategies: Routine screening Cardiovascular Self-Management Outcome: 3 (uncertain)  Respiratory Respiratory Symptoms Reported: No symptoms reported Respiratory Conditions: COPD Respiratory Self-Management Outcome: 4 (good)  Endocrine Patient reports the following symptoms related to hypoglycemia or hyperglycemia : No symptoms reported Is patient diabetic?: No Is patient checking blood sugars at home?: No Endocrine Conditions: Diabetes, Thyroid  disorder Endocrine Management Strategies: Routine screening Endocrine Self-Management Outcome: 4 (good)   Gastrointestinal Gastrointestinal Symptoms Reported: No symptoms reported Gastrointestinal Self-Management Outcome: 4 (good) Nutrition Risk Screen (CP): Reduced oral intake over the last month (supplementing with Ensure per inpatient RD recommendations)  Genitourinary Genitourinary Symptoms Reported: No symptoms reported Genitourinary Self-Management Outcome: 4 (good)  Integumentary Integumentary Symptoms Reported: No symptoms reported Additional Integumentary Details: Denies pressure injury Skin Management Strategies: Routine screening Skin Self-Management Outcome: 4 (good)  Musculoskeletal Musculoskelatal Symptoms Reviewed: Unsteady gait, Weakness, Difficulty walking Musculoskeletal Conditions: Unsteady gait Musculoskeletal Management Strategies: Routine screening Musculoskeletal Self-Management Outcome: 3 (uncertain) Falls in the past year?: Yes Number of falls in past year: 2 or more Was there an injury with Fall?: Yes Fall Risk Category Calculator: 3 Patient Fall Risk Level: High Fall Risk Patient at Risk for Falls Due to: History of fall(s), Impaired balance/gait, Impaired mobility Fall risk Follow up: Falls evaluation completed, Falls prevention discussed, Education provided  Psychosocial Psychosocial Symptoms Reported: No symptoms reported Behavioral Health Self-Management Outcome: 4 (good) Major Change/Loss/Stressor/Fears (CP): Denies Techniques to Cope with Loss/Stress/Change: Not applicable Quality of Family Relationships: helpful, involved, supportive Do you feel physically threatened by others?: No      07/11/2023    2:24 PM  Depression screen PHQ 2/9  Decreased Interest 0  Down, Depressed, Hopeless 0  PHQ - 2 Score 0    There were no vitals filed for this visit.  Medications Reviewed Today     Reviewed by Remona Carmel, RN (Registered Nurse) on 07/11/23 at 1413  Med List Status: <None>   Medication Order Taking? Sig Documenting Provider Last Dose Status  Informant  albuterol  (VENTOLIN  HFA) 108 (90 Base) MCG/ACT inhaler [id number] Yes Inhale 2 puffs into the lungs every 4-6 hours as needed for wheezing or shortness of breath. Gonfa, Taye T, MD Taking Active   alendronate  (FOSAMAX ) 70 MG tablet [id number] Yes TAKE 1  TABLET BY MOUTH WEEKLY  WITH 8 OZ OF PLAIN WATER 30  MINUTES BEFORE FIRST FOOD, DRINK OR MEDS. STAY UPRIGHT FOR 30  MINS Dettinger, Lucio Sabin, MD Taking Active Self, Pharmacy Records, Multiple Informants, Care Giver  amitriptyline  (ELAVIL ) 50 MG tablet [id number] Yes Take 1 tablet (50 mg total) by mouth at bedtime. Dettinger, Lucio Sabin, MD Taking Active Self, Pharmacy Records, Multiple Informants, Care Giver  atorvastatin  (LIPITOR) 40 MG tablet [id number] Yes Take 1 tablet (40 mg total) by mouth daily. Dettinger, Lucio Sabin, MD Taking Active Self, Pharmacy Records, Multiple Informants, Care Giver  calcium  carbonate (OSCAL) 1500 (600 Ca) MG TABS tablet [id number] Yes Take 1 tablet (1,500 mg total) by mouth 2 (two) times daily with a meal. Doreene Gammon D, DO Taking Active Self, Pharmacy Records, Multiple Informants, Care Giver  carvedilol (COREG) 3.125 MG tablet [id number] Yes Take 1 tablet (3.125 mg total) by mouth 2 (two) times daily with a meal. Theadore Finger, MD Taking Active   clopidogrel  (PLAVIX ) 75 MG tablet [id number] Yes Take 1 tablet (75 mg total) by mouth daily. Dettinger, Lucio Sabin, MD Taking Active Self, Pharmacy Records, Multiple Informants, Care Giver  dapagliflozin propanediol (FARXIGA) 10 MG TABS tablet [id number] Yes Take 1 tablet (10 mg total) by mouth daily. Gonfa, Taye T, MD Taking Active   donepezil  (ARICEPT ) 10 MG tablet [id number] No Take 1 tablet (10 mg total) by mouth at bedtime.  Patient not taking: Reported on 07/04/2023   Dettinger, Lucio Sabin, MD Unknown Active Self, Pharmacy Records, Multiple Informants, Care Giver           Med Note Baltazar Leventhal, [place]   Wed Jul 04, 2023  1:52 PM)    donepezil  (ARICEPT ) 10 MG tablet [id number] No  Take 1 tablet (10 mg total) by mouth at bedtime.  Patient not taking: Reported on 07/11/2023   Dettinger, Lucio Sabin, MD Unknown Active   DULoxetine  (CYMBALTA ) 60 MG capsule [id number] Yes Take 1 capsule (60 mg total) by mouth daily. Dettinger, Lucio Sabin, MD Taking Active Self, Pharmacy Records, Multiple Informants, Care Giver           Med Note Nanine Babcock, [place]   Wed Jul 04, 2023  7:37 AM)    feeding supplement (ENSURE ENLIVE / ENSURE PLUS) LIQD [id number] Yes Take 237 mLs by mouth 2 (two) times daily between meals. Gonfa, Taye T, MD Taking Active   levothyroxine  (SYNTHROID ) 125 MCG tablet [id number] Yes Take 1 tablet (125 mcg total) by mouth daily before breakfast. Dettinger, Lucio Sabin, MD Taking Active Self, Pharmacy Records, Multiple Informants, Care Giver  lisinopril  (ZESTRIL ) 40 MG tablet [id number] Yes Take 1 tablet (40 mg total) by mouth daily. Dettinger, Lucio Sabin, MD Taking Active   losartan  (COZAAR ) 25 MG tablet [id number] Yes Take 1 tablet (25 mg total) by mouth daily. Gonfa, Taye T, MD Taking Active   spironolactone (ALDACTONE) 25 MG tablet [id number] Yes Take 0.5 tablets (12.5 mg total) by mouth daily. Gonfa, Taye T, MD Taking Active   triamcinolone  cream (KENALOG ) 0.1 % [id number] Yes Apply 1 application topically 2 (two) times daily.  Patient taking differently: Apply 1 application  topically 2 (two) times daily as needed (Dermatitis).   Dettinger, Lucio Sabin, MD Taking Active Self, Pharmacy Records, Multiple Informants, Care Giver  umeclidinium-vilanterol [facility] ELLIPTA) 62.5-25 MCG/ACT AEPB [id number] Yes Inhale 1 puff into the lungs daily. Gonfa, Taye T, MD Taking Active             Recommendation:   PCP  Follow-up  Follow Up Plan:   Telephone follow up appointment date/time:  08-10-2023 at 11:45 am  Grandville Lax, BSN RN [facility], [facility] RN Care Manager Direct Dial: [phone]  Fax: [phone]

## 2023-07-13 DIAGNOSIS — S32119D Unspecified Zone I fracture of sacrum, subsequent encounter for fracture with routine healing: Secondary | ICD-10-CM | POA: Diagnosis not present

## 2023-07-13 DIAGNOSIS — E119 Type 2 diabetes mellitus without complications: Secondary | ICD-10-CM | POA: Diagnosis not present

## 2023-07-13 DIAGNOSIS — S32592D Other specified fracture of left pubis, subsequent encounter for fracture with routine healing: Secondary | ICD-10-CM | POA: Diagnosis not present

## 2023-07-13 DIAGNOSIS — M47817 Spondylosis without myelopathy or radiculopathy, lumbosacral region: Secondary | ICD-10-CM | POA: Diagnosis not present

## 2023-07-13 DIAGNOSIS — I10 Essential (primary) hypertension: Secondary | ICD-10-CM | POA: Diagnosis not present

## 2023-07-13 NOTE — Progress Notes (Signed)
 HEART & VASCULAR TRANSITION OF CARE CONSULT NOTE    PCP: Dettinger, Lucio Sabin, MD  Cardiologist: Dr. Amanda Jungling  HPI: Referred to clinic by Dr. Gonfa for heart failure consultation.   Laura Dalton is a 73 y.o. female COPD, T2DM, hypothyroidism and new diagnosis of systolic heart failure.  Admitted 4/25 with new systolic heart failure and CAP. Treated with nebs and abx. Diuresed with IV lasix . Echo showed EF 25% with apical akinesis suggesting Takotsubo cardiomyopathy. Underwent R/LHC showing normal cors, normal filling pressures, and preserved CI. GDMT titrated and she was discharged home, weight 150 lbs.  Today she presents to [facility] for post hospital HF follow up with her daughter-in-law. Overall feeling fine. She is not SOB walking with her walker up inclines. Daughter-in-law says she has had falls. Denies palpitations, abnormal bleeding, CP, dizziness, edema, or PND/Orthopnea. Appetite fair. Weight at home 150 pounds. Taking all medications. Lives with grandson. Has 3 children, daughter in law helps with transportation. Has HH PT.  Family Hx: Mother had "bad heart" Social Hx: lives with grandson, no tobacco, ETOH or drug use.  Cardiac Testing  - [facility] 4/25: normal cors, RA 4, PA 30/16 (20), PCWP 12, CO/CI (Fick) 5/2.84, PVR 1.6 WU, PAPi 3.5  - Echo 4/25: EF 25% with RWMAs, normal RV  - Echo 5/17: EF 60-65%  Past Medical History:  Diagnosis Date   Bursitis of hip    Diabetes mellitus without complication (HCC)    type II   Hyperlipidemia    Hypertension    Hypothyroidism    Low back pain    benign turmor back   Stroke [facility])    Vitamin D  deficiency disease    Current Outpatient Medications  Medication Sig Dispense Refill   albuterol  (VENTOLIN  HFA) 108 (90 Base) MCG/ACT inhaler Inhale 2 puffs into the lungs every 4-6 hours as needed for wheezing or shortness of breath. 6.7 g 0   alendronate  (FOSAMAX ) 70 MG tablet TAKE 1 TABLET BY MOUTH WEEKLY  WITH 8 OZ OF PLAIN WATER  30  MINUTES BEFORE FIRST FOOD, DRINK OR MEDS. STAY UPRIGHT FOR 30  MINS 4 tablet 0   amitriptyline  (ELAVIL ) 50 MG tablet Take 1 tablet (50 mg total) by mouth at bedtime. 90 tablet 2   atorvastatin  (LIPITOR) 40 MG tablet Take 1 tablet (40 mg total) by mouth daily. 70 tablet 0   calcium  carbonate (OSCAL) 1500 (600 Ca) MG TABS tablet Take 1 tablet (1,500 mg total) by mouth 2 (two) times daily with a meal. 60 tablet 0   carvedilol  (COREG ) 3.125 MG tablet Take 1 tablet (3.125 mg total) by mouth 2 (two) times daily with a meal. 180 tablet 0   clopidogrel  (PLAVIX ) 75 MG tablet Take 1 tablet (75 mg total) by mouth daily. 70 tablet 0   dapagliflozin  propanediol (FARXIGA ) 10 MG TABS tablet Take 1 tablet (10 mg total) by mouth daily. 90 tablet 0   donepezil  (ARICEPT ) 10 MG tablet Take 1 tablet (10 mg total) by mouth at bedtime. 90 tablet 3   donepezil  (ARICEPT ) 10 MG tablet Take 1 tablet (10 mg total) by mouth at bedtime. 60 tablet 0   DULoxetine  (CYMBALTA ) 60 MG capsule Take 1 capsule (60 mg total) by mouth daily. 90 capsule 1   feeding supplement (ENSURE ENLIVE / ENSURE PLUS) LIQD Take 237 mLs by mouth 2 (two) times daily between meals.     levothyroxine  (SYNTHROID ) 125 MCG tablet Take 1 tablet (125 mcg total) by mouth  daily before breakfast. 70 tablet 0   losartan  (COZAAR ) 25 MG tablet Take 1 tablet (25 mg total) by mouth daily. 90 tablet 0   spironolactone  (ALDACTONE ) 25 MG tablet Take 0.5 tablets (12.5 mg total) by mouth daily. 45 tablet 0   triamcinolone  cream (KENALOG ) 0.1 % Apply 1 application topically 2 (two) times daily. (Patient taking differently: Apply 1 application  topically 2 (two) times daily as needed (Dermatitis).) 80 g 0   umeclidinium-vilanterol (ANORO ELLIPTA ) 62.5-25 MCG/ACT AEPB Inhale 1 puff into the lungs daily. 60 each 0   No current facility-administered medications for this encounter.   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   Children out of town   She depends on her neighbor a lot for yard work and to check on her daily   Social Drivers of Corporate investment banker Strain: Low Risk  (07/09/2023)   Overall Financial Resource Strain (CARDIA)    Difficulty of Paying Living Expenses: Not hard at all  Food Insecurity: No Food Insecurity (07/11/2023)   Hunger Vital Sign    Worried About Running Out of Food in the Last Year: Never true    Ran Out of Food in the Last Year: Never true  Transportation Needs: No Transportation Needs (07/11/2023)   PRAPARE - Administrator, Civil Service (Medical): No    Lack of Transportation (Non-Medical): No  Physical Activity: Inactive (01/31/2023)   Exercise Vital Sign    Days of Exercise per Week: 0 days    Minutes of Exercise per Session: 0 min  Stress: No Stress Concern Present (06/21/2023)   [employer] of Occupational Health - Occupational Stress Questionnaire    Feeling of Stress : Not at all  Social Connections: Moderately Integrated (07/04/2023)   Social Connection and Isolation Panel [NHANES]    Frequency of Communication with Friends  and Family: Twice a week    Frequency of Social Gatherings with Friends and Family: Twice a week    Attends Religious Services: More than 4 times per year    Active Member of [employer] or Organizations: Yes    Attends Banker Meetings: More than 4 times per year    Marital Status: Widowed  Intimate Partner Violence: Not At Risk (07/11/2023)   Humiliation, Afraid, Rape, and Kick questionnaire    Fear of Current or Ex-Partner: No    Emotionally Abused: No    Physically Abused: No  Sexually Abused: No   Family History  Problem Relation Age of Onset   Diabetes Mother    Stroke Mother    Seizures Mother    Cancer Father        prostate   COPD Sister    COPD Sister    COPD Sister    Cancer Brother        lung   Cancer Brother        prostate   COPD Brother    COPD Brother    Thyroid  disease Son    Hashimoto's thyroiditis Son    Breast cancer Neg Hx    Wt Readings from Last 3 Encounters:  07/17/23 66 kg (145 lb 6.4 oz)  07/08/23 68 kg (150 lb)  06/21/23 68 kg (150 lb)   BP 115/68   Pulse 71   Ht 5\' 6"  (1.676 m)   Wt 66 kg (145 lb 6.4 oz)   SpO2 96%   BMI 23.47 kg/m   PHYSICAL EXAM: General:  NAD. No resp difficulty, arrived in [facility], elderly HEENT: Normal Neck: Supple. No JVD. Cor: Regular rate & rhythm. No rubs, gallops or murmurs. Lungs: Clear Abdomen: Soft, nontender, nondistended.  Extremities: No cyanosis, clubbing, rash, edema Neuro: Alert & oriented x 3, moves all 4 extremities w/o difficulty. Affect pleasant.  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 No GU symptoms - Continue losartan  25 mg daily. Will not switch to  Entresto with hx of falls and dizziness. - Continue spiro 12.5 mg daily - Continue Coreg  3.125 mg bid - Labs today - Recommend repeat echo in 3 months - Not a candidate for advanced therapies with age & frailty.  HTN - BP well controlled - GDMT as above  3. Physical deconditioning - Continue PT - Consider CR when graduates PT  NYHA II GDMT  Diuretic: does not need BB: Coreg  3.125 mg bid Ace/ARB/ARNI: losartan  25 mg daily MRA: spiro 12.5 mg daily SGLT2i: Farxiga  10 mg daily  Referred to HFSW (PCP, Medications, Transportation, ETOH Abuse, Drug Abuse, Insurance, Surveyor, quantity ): No Refer to Pharmacy: No Refer to Home Health: No Refer to Advanced Heart Failure Clinic: No Refer to General Cardiology: No, established with Dr. Amanda Jungling  Follow up PRN.  Vernia Good, FNP-BC 07/17/23

## 2023-07-14 ENCOUNTER — Other Ambulatory Visit: Payer: Self-pay

## 2023-07-14 MED FILL — Levothyroxine Sodium Tab 125 MCG: ORAL | 70 days supply | Qty: 70 | Fill #0 | Status: CN

## 2023-07-14 MED FILL — Atorvastatin Calcium Tab 40 MG (Base Equivalent): ORAL | 70 days supply | Qty: 70 | Fill #0 | Status: CN

## 2023-07-14 MED FILL — Clopidogrel Bisulfate Tab 75 MG (Base Equiv): ORAL | 70 days supply | Qty: 70 | Fill #0 | Status: CN

## 2023-07-14 MED FILL — Alendronate Sodium Tab 70 MG: ORAL | 28 days supply | Qty: 4 | Fill #0 | Status: CN

## 2023-07-16 ENCOUNTER — Telehealth (HOSPITAL_COMMUNITY): Payer: Self-pay

## 2023-07-16 NOTE — Telephone Encounter (Signed)
 Called to confirm/remind patient of their appointment at the [facility] on 07/17/23 2:00.   Appointment:   [x] Confirmed  [] Left mess   [] No answer/No voice mail  [] VM Full/unable to leave message  [] Phone not in service  Patient reminded to bring all medications and/or complete list.  Confirmed patient has transportation. Gave directions, instructed to utilize valet parking.

## 2023-07-17 ENCOUNTER — Other Ambulatory Visit: Payer: Self-pay

## 2023-07-17 ENCOUNTER — Encounter (HOSPITAL_COMMUNITY): Payer: Self-pay

## 2023-07-17 ENCOUNTER — Other Ambulatory Visit (HOSPITAL_COMMUNITY): Payer: Self-pay

## 2023-07-17 ENCOUNTER — Ambulatory Visit (HOSPITAL_COMMUNITY)
Admission: RE | Admit: 2023-07-17 | Discharge: 2023-07-17 | Disposition: A | Discharge status: Home / Self Care | Source: Ambulatory Visit | Attending: Family Medicine | Admitting: Family Medicine

## 2023-07-17 VITALS — BP 115/68 | HR 71 | Ht 66.0 in | Wt 145.4 lb

## 2023-07-17 DIAGNOSIS — I5022 Chronic systolic (congestive) heart failure: Secondary | ICD-10-CM | POA: Insufficient documentation

## 2023-07-17 DIAGNOSIS — R5381 Other malaise: Secondary | ICD-10-CM | POA: Diagnosis not present

## 2023-07-17 DIAGNOSIS — Z79899 Other long term (current) drug therapy: Secondary | ICD-10-CM | POA: Diagnosis not present

## 2023-07-17 DIAGNOSIS — Z833 Family history of diabetes mellitus: Secondary | ICD-10-CM | POA: Diagnosis not present

## 2023-07-17 DIAGNOSIS — Z7984 Long term (current) use of oral hypoglycemic drugs: Secondary | ICD-10-CM | POA: Insufficient documentation

## 2023-07-17 DIAGNOSIS — S32119D Unspecified Zone I fracture of sacrum, subsequent encounter for fracture with routine healing: Secondary | ICD-10-CM | POA: Diagnosis not present

## 2023-07-17 DIAGNOSIS — E039 Hypothyroidism, unspecified: Secondary | ICD-10-CM | POA: Insufficient documentation

## 2023-07-17 DIAGNOSIS — I110 Hypertensive heart disease with heart failure: Secondary | ICD-10-CM | POA: Insufficient documentation

## 2023-07-17 DIAGNOSIS — E119 Type 2 diabetes mellitus without complications: Secondary | ICD-10-CM | POA: Diagnosis not present

## 2023-07-17 DIAGNOSIS — S32592D Other specified fracture of left pubis, subsequent encounter for fracture with routine healing: Secondary | ICD-10-CM | POA: Diagnosis not present

## 2023-07-17 DIAGNOSIS — M47817 Spondylosis without myelopathy or radiculopathy, lumbosacral region: Secondary | ICD-10-CM | POA: Diagnosis not present

## 2023-07-17 DIAGNOSIS — J449 Chronic obstructive pulmonary disease, unspecified: Secondary | ICD-10-CM | POA: Diagnosis not present

## 2023-07-17 DIAGNOSIS — I10 Essential (primary) hypertension: Secondary | ICD-10-CM

## 2023-07-17 LAB — BASIC METABOLIC PANEL WITH GFR
Anion gap: 8 (ref 5–15)
BUN: 7 mg/dL — ABNORMAL LOW (ref 8–23)
CO2: 26 mmol/L (ref 22–32)
Calcium: 9.5 mg/dL (ref 8.9–10.3)
Chloride: 101 mmol/L (ref 98–111)
Creatinine, Ser: 0.65 mg/dL (ref 0.44–1.00)
GFR, Estimated: >60 mL/min (ref >60)
Glucose, Bld: 111 mg/dL — ABNORMAL HIGH (ref 70–99)
Potassium: 4.2 mmol/L (ref 3.5–5.1)
Sodium: 135 mmol/L (ref 135–145)

## 2023-07-17 LAB — BRAIN NATRIURETIC PEPTIDE: B Natriuretic Peptide: 152 pg/mL — ABNORMAL HIGH (ref 0.0–100.0)

## 2023-07-17 MED ORDER — CARVEDILOL 3.125 MG PO TABS
3.1250 mg | ORAL_TABLET | Freq: Two times a day (BID) | ORAL | 0 refills | Status: DC
  Filled 2023-07-17 – 2023-10-19 (×2): qty 180, 90d supply, fill #0

## 2023-07-17 MED ORDER — DAPAGLIFLOZIN PROPANEDIOL 10 MG PO TABS
10.0000 mg | ORAL_TABLET | Freq: Every day | ORAL | 1 refills | Status: DC
  Filled 2023-07-17 – 2023-08-10 (×2): qty 90, 90d supply, fill #0

## 2023-07-17 MED ORDER — ATORVASTATIN CALCIUM 40 MG PO TABS
40.0000 mg | ORAL_TABLET | Freq: Every day | ORAL | 1 refills | Status: AC
  Filled 2023-07-17 – 2023-10-19 (×4): qty 90, 90d supply, fill #0
  Filled 2023-12-14: qty 90, 90d supply, fill #1

## 2023-07-17 MED ORDER — SPIRONOLACTONE 25 MG PO TABS
12.5000 mg | ORAL_TABLET | Freq: Every day | ORAL | 1 refills | Status: AC
  Filled 2023-07-17 – 2023-09-07 (×2): qty 45, 90d supply, fill #0
  Filled 2023-12-18: qty 45, 90d supply, fill #1

## 2023-07-17 MED ORDER — LOSARTAN POTASSIUM 25 MG PO TABS
25.0000 mg | ORAL_TABLET | Freq: Every day | ORAL | 1 refills | Status: DC
  Filled 2023-07-17: qty 90, 90d supply, fill #0

## 2023-07-17 MED FILL — Levothyroxine Sodium Tab 125 MCG: ORAL | 70 days supply | Qty: 70 | Fill #0 | Status: CN

## 2023-07-17 MED FILL — Alendronate Sodium Tab 70 MG: ORAL | 28 days supply | Qty: 4 | Fill #0 | Status: CN

## 2023-07-17 MED FILL — Clopidogrel Bisulfate Tab 75 MG (Base Equiv): ORAL | 70 days supply | Qty: 70 | Fill #0 | Status: CN

## 2023-07-17 NOTE — Patient Instructions (Signed)
 Medication Changes:  No Changes In Medications at this time.   MEDICATIONS REFILLED TO [facility]- PLEASE CALL THEM TO SET UP SHIPMENT DETAILS  Lab Work:  Labs done today, your results will be available in MyChart, we will contact you for abnormal readings.  Follow-Up in: AS NEEDED   At the [facility], you and your health needs are our priority. We have a designated team specialized in the treatment of Heart Failure. This Care Team includes your primary Heart Failure Specialized Cardiologist (physician), Advanced Practice Providers (APPs- Physician Assistants and Nurse Practitioners), and Pharmacist who all work together to provide you with the care you need, when you need it.   You may see any of the following providers on your designated Care Team at your next follow up:  Dr. Jules Oar Dr. Peder Bourdon Dr. Alwin Baars Dr. Judyth Nunnery Nieves Bars, NP Ruddy Corral, [place] [facility] [place], [place] Dennise Fitz, NP [place] Lee, NP Luster Salters, PharmD   Please be sure to bring in all your medications bottles to every appointment.   Need to Contact Us :  If you have any questions or concerns before your next appointment please send us  a message through [place] or call our office at [phone].    TO LEAVE A MESSAGE FOR THE NURSE SELECT OPTION 2, PLEASE LEAVE A MESSAGE INCLUDING: YOUR NAME DATE OF BIRTH CALL BACK NUMBER REASON FOR CALL**this is important as we prioritize the call backs  YOU WILL RECEIVE A CALL BACK THE SAME DAY AS LONG AS YOU CALL BEFORE 4:00 PM

## 2023-07-18 ENCOUNTER — Other Ambulatory Visit (HOSPITAL_COMMUNITY): Payer: Self-pay

## 2023-07-18 DIAGNOSIS — S32119D Unspecified Zone I fracture of sacrum, subsequent encounter for fracture with routine healing: Secondary | ICD-10-CM | POA: Diagnosis not present

## 2023-07-18 DIAGNOSIS — I10 Essential (primary) hypertension: Secondary | ICD-10-CM | POA: Diagnosis not present

## 2023-07-18 DIAGNOSIS — E119 Type 2 diabetes mellitus without complications: Secondary | ICD-10-CM | POA: Diagnosis not present

## 2023-07-18 DIAGNOSIS — S32592D Other specified fracture of left pubis, subsequent encounter for fracture with routine healing: Secondary | ICD-10-CM | POA: Diagnosis not present

## 2023-07-18 DIAGNOSIS — M47817 Spondylosis without myelopathy or radiculopathy, lumbosacral region: Secondary | ICD-10-CM | POA: Diagnosis not present

## 2023-07-18 NOTE — Progress Notes (Deleted)
 Established Patient Office Visit  Subjective  Patient ID: Laura Dalton, female    DOB: 1950-12-02  Age: 73 y.o. MRN: [id number]  No chief complaint on file.   HPI  Patient Active Problem List   Diagnosis Date Noted   Chronic systolic heart failure (HCC) 07/17/2023   Aspiration pneumonia (HCC) 07/05/2023   Pressure injury of skin 07/05/2023   Acute combined systolic and diastolic heart failure (HCC) 07/05/2023   Protein-calorie malnutrition, severe 07/04/2023   Acute respiratory failure with hypoxia and hypercapnia (HCC) 07/03/2023   Hyponatremia 07/03/2023   Fall 06/01/2023   Type 2 diabetes mellitus with hyperglycemia (HCC) 06/01/2023   Closed fracture of multiple pubic rami, left, initial encounter (HCC) 05/31/2023   Lumbar spondylosis 10/17/2022   Osteopenia with high risk of fracture 05/18/2022   Thoracic compression fracture (HCC) 04/13/2022   Lumbar compression fracture (HCC) 04/13/2022   Memory changes 08/23/2021   Thoracic myelopathy 10/12/2018   Degeneration of spine 05/07/2017   Obesity (BMI 30.0-34.9) 03/12/2015   Acquired hypothyroidism 07/29/2014   Osteoarthritis of right shoulder 01/21/2014   Leg length discrepancy 11/04/2013   Lumbar post-laminectomy syndrome 11/04/2013   Diabetic peripheral neuropathy associated with type 2 diabetes mellitus (HCC) 11/04/2013   COPD with acute exacerbation (HCC) 10/01/2012   Chronic back pain 10/01/2012   Essential hypertension 10/01/2012   DM (diabetes mellitus) (HCC) 10/01/2012   Vitamin D  deficiency 10/01/2012   Mixed hyperlipidemia 10/01/2012   Dyslipidemia due to type 2 diabetes mellitus (HCC) 10/01/2012   Past Medical History:  Diagnosis Date   Bursitis of hip    Diabetes mellitus without complication (HCC)    type II   Hyperlipidemia    Hypertension    Hypothyroidism    Low back pain    benign turmor back   Stroke (HCC)    Vitamin D  deficiency disease    Past Surgical History:  Procedure Laterality  Date   APPENDECTOMY     BACK SURGERY  1996   turmor on back    CHOLECYSTECTOMY     LEG SURGERY Right    "clot and had drain in leg  rt lower leg   RIGHT/LEFT HEART CATH AND CORONARY ANGIOGRAPHY N/A 07/09/2023   Procedure: RIGHT/LEFT HEART CATH AND CORONARY ANGIOGRAPHY;  Surgeon: [name], MD;  Location: [facility];  Service: Cardiovascular;  Laterality: N/A;   Social History   Tobacco Use   Smoking status: Former    Current packs/day: 0.00    Average packs/day: 0.5 packs/day for 45.3 years (22.6 ttl pk-yrs)    Types: Cigarettes    Start date: 08/15/1962    Quit date: 12/01/2007    Years since quitting: 15.6   Smokeless tobacco: Never  Vaping Use   Vaping status: Never Used  Substance Use Topics   Alcohol  use: No    Alcohol /week: 0.0 standard drinks of alcohol    Drug use: No   Social History   Socioeconomic History   Marital status: Widowed    Spouse name: Not on file   Number of children: 3   Years of education: 14   Highest education level: Associate degree: academic program  Occupational History   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   Children out of town   She depends on her neighbor a lot for yard work and to check on her daily   Social Drivers of Corporate investment banker Strain: Low Risk  (07/09/2023)   Overall Financial Resource Strain (CARDIA)    Difficulty of Paying Living Expenses: Not hard at all  Food  Insecurity: No Food Insecurity (07/11/2023)   Hunger Vital Sign    Worried About Running Out of Food in the Last Year: Never true    Ran Out of Food in the Last Year: Never true  Transportation Needs: No Transportation Needs (07/11/2023)   PRAPARE - Administrator, Civil Service (Medical): No    Lack of Transportation (Non-Medical): No  Physical Activity: Inactive (01/31/2023)   Exercise Vital Sign    Days of Exercise per Week: 0 days    Minutes of Exercise per Session: 0 min  Stress: No Stress Concern Present (06/21/2023)   [employer] of Occupational Health - Occupational Stress Questionnaire    Feeling of Stress : Not at all  Social Connections: Moderately Integrated (07/04/2023)   Social Connection and Isolation Panel [NHANES]    Frequency of Communication with Friends and Family: Twice a week    Frequency of Social Gatherings with Friends and Family: Twice a week    Attends Religious Services: More than 4 times per year    Active Member of [employer] or Organizations: Yes    Attends Banker Meetings: More than 4 times per year    Marital Status: Widowed  Intimate Partner Violence: Not At Risk (07/11/2023)   Humiliation, Afraid, Rape, and Kick questionnaire    Fear of Current or Ex-Partner: No    Emotionally Abused: No    Physically Abused: No    Sexually Abused: No   Family Status  Relation Name Status   Mother  Deceased at age 1   Father  Deceased at age 83   Sister  Deceased   Sister  Deceased   Sister  Deceased   Sister  Deceased   Daughter  Armed forces training and education officer   Brother  Deceased   Brother  Deceased   Brother  Deceased   Brother  Alive   Brother  Alive   Brother  Alive   Son  Alive   Son  Alive   Other widowed Alive   Neg Hx  (Not Specified)  No partnership data on file   Family History  Problem Relation Age of Onset   Diabetes Mother    Stroke Mother    Seizures Mother    Cancer Father        prostate   COPD Sister    COPD Sister    COPD  Sister    Cancer Brother        lung   Cancer Brother        prostate   COPD Brother    COPD Brother    Thyroid  disease Son    Hashimoto's thyroiditis Son    Breast cancer Neg Hx    Allergies  Allergen Reactions   Aspirin  Hives  Gabapentin  Other (See Comments)    Dizzy,falls      ROS Negative unless indicated in HPI   Objective:     There were no vitals taken for this visit. BP Readings from Last 3 Encounters:  07/17/23 115/68  07/09/23 128/89  06/02/23 (!) 137/53   Wt Readings from Last 3 Encounters:  07/17/23 145 lb 6.4 oz (66 kg)  07/08/23 150 lb (68 kg)  06/21/23 150 lb (68 kg)      Physical Exam   No results found for any visits on 07/19/23.  Last CBC Lab Results  Component Value Date   WBC 23.8 (H) 07/10/2023   HGB 12.3 07/10/2023   HCT 37.3 07/10/2023   MCV 83.1 07/10/2023   MCH 27.4 07/10/2023   RDW 13.5 07/10/2023   PLT 327 07/10/2023   Last metabolic panel Lab Results  Component Value Date   GLUCOSE 111 (H) 07/17/2023   NA 135 07/17/2023   K 4.2 07/17/2023   CL 101 07/17/2023   CO2 26 07/17/2023   BUN 7 (L) 07/17/2023   CREATININE 0.65 07/17/2023   GFRNONAA >60 07/17/2023   CALCIUM  9.5 07/17/2023   PHOS 4.1 07/10/2023   PROT 6.8 07/03/2023   ALBUMIN 2.9 (L) 07/10/2023   LABGLOB 2.0 10/23/2022   AGRATIO 1.8 08/25/2022   BILITOT 0.6 07/03/2023   ALKPHOS 80 07/03/2023   AST 24 07/03/2023   ALT 9 07/03/2023   ANIONGAP 8 07/17/2023   Last lipids Lab Results  Component Value Date   CHOL 148 10/23/2022   HDL 58 10/23/2022   LDLCALC 71 10/23/2022   TRIG 104 10/23/2022   CHOLHDL 2.6 10/23/2022   Last hemoglobin A1c Lab Results  Component Value Date   HGBA1C 5.3 07/03/2023   Last thyroid  functions Lab Results  Component Value Date   TSH 0.482 07/17/2022   T4TOTAL 9.0 03/12/2015        Assessment & Plan:  There are no diagnoses linked to this encounter. Continue healthy lifestyle choices, including diet (rich in  fruits, vegetables, and lean proteins, and low in salt and simple carbohydrates) and exercise (at least 30 minutes of moderate physical activity daily).     The above assessment and management plan was discussed with the patient. The patient verbalized understanding of and has agreed to the management plan. Patient is aware to call the clinic if they develop any new symptoms or if symptoms persist or worsen. Patient is aware when to return to the clinic for a follow-up visit. Patient educated on when it is appropriate to go to the emergency department.  No follow-ups on file.    Ellyanna Holton [place] Thompson, DNP [facility] [address] [place], [place] [address] [phone]    Note: This document was prepared by [employer] voice dictation technology and any errors that results from this process are unintentional.

## 2023-07-19 ENCOUNTER — Other Ambulatory Visit: Payer: Self-pay | Admitting: Cardiology

## 2023-07-19 ENCOUNTER — Inpatient Hospital Stay: Admitting: Nurse Practitioner

## 2023-07-20 DIAGNOSIS — M47817 Spondylosis without myelopathy or radiculopathy, lumbosacral region: Secondary | ICD-10-CM | POA: Diagnosis not present

## 2023-07-20 DIAGNOSIS — S32592D Other specified fracture of left pubis, subsequent encounter for fracture with routine healing: Secondary | ICD-10-CM | POA: Diagnosis not present

## 2023-07-20 DIAGNOSIS — E119 Type 2 diabetes mellitus without complications: Secondary | ICD-10-CM | POA: Diagnosis not present

## 2023-07-20 DIAGNOSIS — I10 Essential (primary) hypertension: Secondary | ICD-10-CM | POA: Diagnosis not present

## 2023-07-20 DIAGNOSIS — S32119D Unspecified Zone I fracture of sacrum, subsequent encounter for fracture with routine healing: Secondary | ICD-10-CM | POA: Diagnosis not present

## 2023-07-23 DIAGNOSIS — S32592D Other specified fracture of left pubis, subsequent encounter for fracture with routine healing: Secondary | ICD-10-CM | POA: Diagnosis not present

## 2023-07-23 DIAGNOSIS — M47817 Spondylosis without myelopathy or radiculopathy, lumbosacral region: Secondary | ICD-10-CM | POA: Diagnosis not present

## 2023-07-23 DIAGNOSIS — E119 Type 2 diabetes mellitus without complications: Secondary | ICD-10-CM | POA: Diagnosis not present

## 2023-07-23 DIAGNOSIS — I10 Essential (primary) hypertension: Secondary | ICD-10-CM | POA: Diagnosis not present

## 2023-07-23 DIAGNOSIS — S32119D Unspecified Zone I fracture of sacrum, subsequent encounter for fracture with routine healing: Secondary | ICD-10-CM | POA: Diagnosis not present

## 2023-07-25 DIAGNOSIS — I10 Essential (primary) hypertension: Secondary | ICD-10-CM | POA: Diagnosis not present

## 2023-07-25 DIAGNOSIS — M47817 Spondylosis without myelopathy or radiculopathy, lumbosacral region: Secondary | ICD-10-CM | POA: Diagnosis not present

## 2023-07-25 DIAGNOSIS — S32592D Other specified fracture of left pubis, subsequent encounter for fracture with routine healing: Secondary | ICD-10-CM | POA: Diagnosis not present

## 2023-07-25 DIAGNOSIS — S32119D Unspecified Zone I fracture of sacrum, subsequent encounter for fracture with routine healing: Secondary | ICD-10-CM | POA: Diagnosis not present

## 2023-07-25 DIAGNOSIS — E119 Type 2 diabetes mellitus without complications: Secondary | ICD-10-CM | POA: Diagnosis not present

## 2023-08-01 DIAGNOSIS — S32119D Unspecified Zone I fracture of sacrum, subsequent encounter for fracture with routine healing: Secondary | ICD-10-CM | POA: Diagnosis not present

## 2023-08-01 DIAGNOSIS — I10 Essential (primary) hypertension: Secondary | ICD-10-CM | POA: Diagnosis not present

## 2023-08-01 DIAGNOSIS — S32592D Other specified fracture of left pubis, subsequent encounter for fracture with routine healing: Secondary | ICD-10-CM | POA: Diagnosis not present

## 2023-08-01 DIAGNOSIS — M47817 Spondylosis without myelopathy or radiculopathy, lumbosacral region: Secondary | ICD-10-CM | POA: Diagnosis not present

## 2023-08-01 DIAGNOSIS — E119 Type 2 diabetes mellitus without complications: Secondary | ICD-10-CM | POA: Diagnosis not present

## 2023-08-07 ENCOUNTER — Other Ambulatory Visit (HOSPITAL_COMMUNITY): Payer: Self-pay

## 2023-08-07 ENCOUNTER — Other Ambulatory Visit: Payer: Self-pay

## 2023-08-07 DIAGNOSIS — S32119D Unspecified Zone I fracture of sacrum, subsequent encounter for fracture with routine healing: Secondary | ICD-10-CM | POA: Diagnosis not present

## 2023-08-07 DIAGNOSIS — M47817 Spondylosis without myelopathy or radiculopathy, lumbosacral region: Secondary | ICD-10-CM | POA: Diagnosis not present

## 2023-08-07 DIAGNOSIS — I10 Essential (primary) hypertension: Secondary | ICD-10-CM | POA: Diagnosis not present

## 2023-08-07 DIAGNOSIS — E119 Type 2 diabetes mellitus without complications: Secondary | ICD-10-CM | POA: Diagnosis not present

## 2023-08-07 DIAGNOSIS — S32592D Other specified fracture of left pubis, subsequent encounter for fracture with routine healing: Secondary | ICD-10-CM | POA: Diagnosis not present

## 2023-08-10 ENCOUNTER — Ambulatory Visit: Admitting: Family Medicine

## 2023-08-10 ENCOUNTER — Telehealth: Payer: Self-pay | Admitting: Family Medicine

## 2023-08-10 ENCOUNTER — Other Ambulatory Visit (HOSPITAL_COMMUNITY): Payer: Self-pay

## 2023-08-10 ENCOUNTER — Encounter: Payer: Self-pay | Admitting: Family Medicine

## 2023-08-10 ENCOUNTER — Telehealth: Payer: Self-pay | Admitting: Cardiology

## 2023-08-10 ENCOUNTER — Other Ambulatory Visit: Payer: Self-pay | Admitting: *Deleted

## 2023-08-10 ENCOUNTER — Ambulatory Visit (INDEPENDENT_AMBULATORY_CARE_PROVIDER_SITE_OTHER)

## 2023-08-10 VITALS — BP 132/70 | HR 88 | Ht 66.0 in | Wt 141.0 lb

## 2023-08-10 DIAGNOSIS — J9602 Acute respiratory failure with hypercapnia: Secondary | ICD-10-CM

## 2023-08-10 DIAGNOSIS — J9601 Acute respiratory failure with hypoxia: Secondary | ICD-10-CM | POA: Diagnosis not present

## 2023-08-10 DIAGNOSIS — I5043 Acute on chronic combined systolic (congestive) and diastolic (congestive) heart failure: Secondary | ICD-10-CM | POA: Diagnosis not present

## 2023-08-10 DIAGNOSIS — R918 Other nonspecific abnormal finding of lung field: Secondary | ICD-10-CM | POA: Diagnosis not present

## 2023-08-10 MED FILL — Duloxetine HCl Enteric Coated Pellets Cap 60 MG (Base Eq): ORAL | 90 days supply | Qty: 90 | Fill #0 | Status: CN

## 2023-08-10 MED FILL — Levothyroxine Sodium Tab 125 MCG: ORAL | 70 days supply | Qty: 70 | Fill #0 | Status: CN

## 2023-08-10 NOTE — Telephone Encounter (Signed)
 Patient meant for this refill to go to pcp, she will call them.

## 2023-08-10 NOTE — Patient Outreach (Signed)
 Complex Care Management   Visit Note  08/10/2023  Name:  Laura Dalton MRN: [id number] DOB: 16-Aug-1950  Situation: Referral received for Complex Care Management related to Heart Failure and COPD I obtained verbal consent from Patient.  Visit completed with patient  on the phone  Background:   Past Medical History:  Diagnosis Date   Bursitis of hip    Diabetes mellitus without complication (HCC)    type II   Hyperlipidemia    Hypertension    Hypothyroidism    Low back pain    benign turmor back   Stroke (HCC)    Vitamin D  deficiency disease     Assessment: Patient Reported Symptoms:  Cognitive Cognitive Status: Alert and oriented to person, place, and time Cognitive/Intellectual Conditions Management [RPT]: None reported or documented in medical history or problem list   Health Maintenance Behaviors: Annual physical exam  Neurological Neurological Review of Symptoms: No symptoms reported    HEENT HEENT Symptoms Reported: Change or loss of hearing HEENT Conditions: Ear problem(s) HEENT Management Strategies: Routine screening Ear problem(s)  Cardiovascular Cardiovascular Symptoms Reported: No symptoms reported Weight: 141 lb (64 kg)  Respiratory Respiratory Symptoms Reported: No symptoms reported Respiratory Conditions: COPD  Endocrine Patient reports the following symptoms related to hypoglycemia or hyperglycemia : No symptoms reported Is patient diabetic?: No Is patient checking blood sugars at home?: No Endocrine Conditions: Diabetes, Thyroid  disorder Endocrine Management Strategies: Medication therapy  Gastrointestinal Gastrointestinal Symptoms Reported: No symptoms reported      Genitourinary Genitourinary Symptoms Reported: No symptoms reported    Integumentary Integumentary Symptoms Reported: No symptoms reported    Musculoskeletal Musculoskelatal Symptoms Reviewed: Unsteady gait Musculoskeletal Conditions: Unsteady gait Falls in the past year?:  Yes Number of falls in past year: 2 or more Was there an injury with Fall?: Yes Fall Risk Category Calculator: 3 Patient Fall Risk Level: High Fall Risk Patient at Risk for Falls Due to: History of fall(s), Impaired mobility, Impaired balance/gait Fall risk Follow up: Falls evaluation completed  Psychosocial Psychosocial Symptoms Reported: No symptoms reported     Quality of Family Relationships: helpful, involved, supportive Do you feel physically threatened by others?: No      08/10/2023   12:27 PM  Depression screen PHQ 2/9  Decreased Interest 2  Down, Depressed, Hopeless 0  PHQ - 2 Score 2  Altered sleeping 1  Tired, decreased energy 2  Change in appetite 3  Feeling bad or failure about yourself  1  Trouble concentrating 2  Moving slowly or fidgety/restless 2  Suicidal thoughts 0  PHQ-9 Score 13  Difficult doing work/chores Somewhat difficult    There were no vitals filed for this visit.  Medications Reviewed Today     Reviewed by Remona Carmel, RN (Registered Nurse) on 08/10/23 at 1211  Med List Status: <None>   Medication Order Taking? Sig Documenting Provider Last Dose Status Informant  albuterol  (VENTOLIN  HFA) 108 (90 Base) MCG/ACT inhaler [id number] No Inhale 2 puffs into the lungs every 4-6 hours as needed for wheezing or shortness of breath. Gonfa, Taye T, MD Taking Active   alendronate  (FOSAMAX ) 70 MG tablet [id number] No TAKE 1 TABLET BY MOUTH WEEKLY  WITH 8 OZ OF PLAIN WATER 30  MINUTES BEFORE FIRST FOOD, DRINK OR MEDS. STAY UPRIGHT FOR 30  MINS Dettinger, Lucio Sabin, MD Taking Active Self, Pharmacy Records, Multiple Informants, Care Giver  amitriptyline  (ELAVIL ) 50 MG tablet [id number] No Take 1 tablet (50 mg total) by mouth at bedtime. Dettinger,  Lucio Sabin, MD Taking Active Self, Pharmacy Records, Multiple Informants, Care Giver  atorvastatin  (LIPITOR) 40 MG tablet [id number] No Take 1 tablet (40 mg total) by mouth daily. [place], Arlice Bene, FNP Taking Active    calcium  carbonate (OSCAL) 1500 (600 Ca) MG TABS tablet [id number] No Take 1 tablet (1,500 mg total) by mouth 2 (two) times daily with a meal. Doreene Gammon D, DO Taking Active Self, Pharmacy Records, Multiple Informants, Care Giver  carvedilol  (COREG ) 3.125 MG tablet [id number] No Take 1 tablet (3.125 mg total) by mouth 2 (two) times daily with a meal. [place], Arlice Bene, FNP Taking Active   clopidogrel  (PLAVIX ) 75 MG tablet [id number] No Take 1 tablet (75 mg total) by mouth daily. Dettinger, Lucio Sabin, MD Taking Active Self, Pharmacy Records, Multiple Informants, Care Giver  dapagliflozin  propanediol (FARXIGA ) 10 MG TABS tablet [id number] No Take 1 tablet (10 mg total) by mouth daily. [place], Arlice Bene, FNP Taking Active   donepezil  (ARICEPT ) 10 MG tablet [id number] No Take 1 tablet (10 mg total) by mouth at bedtime. Dettinger, Lucio Sabin, MD Taking Active Self, Pharmacy Records, Multiple Informants, Care Giver           Med Note Baltazar Leventhal, [place]   Wed Jul 04, 2023  1:52 PM)    donepezil  (ARICEPT ) 10 MG tablet [id number] No Take 1 tablet (10 mg total) by mouth at bedtime. Dettinger, Lucio Sabin, MD Taking Active   DULoxetine  (CYMBALTA ) 60 MG capsule [id number] No Take 1 capsule (60 mg total) by mouth daily. Dettinger, Lucio Sabin, MD Taking Active Self, Pharmacy Records, Multiple Informants, Care Giver           Med Note Nanine Babcock, [place] D   Wed Jul 04, 2023  7:37 AM)    feeding supplement (ENSURE ENLIVE / ENSURE PLUS) LIQD [id number] No Take 237 mLs by mouth 2 (two) times daily between meals. Gonfa, Taye T, MD Taking Active   levothyroxine  (SYNTHROID ) 125 MCG tablet [id number] No Take 1 tablet (125 mcg total) by mouth daily before breakfast. Dettinger, Lucio Sabin, MD Taking Active Self, Pharmacy Records, Multiple Informants, Care Giver  losartan  (COZAAR ) 25 MG tablet [id number] No Take 1 tablet (25 mg total) by mouth daily. [place], Arlice Bene, FNP Taking Active   spironolactone  (ALDACTONE ) 25 MG tablet  [id number] No Take 0.5 tablets (12.5 mg total) by mouth daily. [place], Arlice Bene, FNP Taking Active   triamcinolone  cream (KENALOG ) 0.1 % [id number] No Apply 1 application topically 2 (two) times daily.  Patient taking differently: Apply 1 application  topically 2 (two) times daily as needed (Dermatitis).   Dettinger, Lucio Sabin, MD Taking Active Self, Pharmacy Records, Multiple Informants, Care Giver  umeclidinium-vilanterol (ANORO ELLIPTA ) 62.5-25 MCG/ACT AEPB [id number] No Inhale 1 puff into the lungs daily. Gonfa, Taye T, MD Taking Active             Recommendation:   Continue Current Plan of Care  Follow Up Plan:   Telephone follow up appointment date/time:  09-07-2023 at 10:30 am  [place] Lax, BSN RN [facility], [facility] RN Care Manager Direct Dial: [phone]  Fax: [phone]

## 2023-08-10 NOTE — Telephone Encounter (Signed)
*  STAT* If patient is at the pharmacy, call can be transferred to refill team.   1. Which medications need to be refilled? (please list name of each medication and dose if known)   umeclidinium-vilanterol (ANORO ELLIPTA ) 62.5-25 MCG/ACT AEPB     4. Which pharmacy/location (including street and city if local pharmacy) is medication to be sent to?[facility] [address], [place] - [address] Phone: [phone]  Fax: [phone]     5. Do they need a 30 day or 90 day supply? 90

## 2023-08-10 NOTE — Progress Notes (Signed)
 BP 132/70   Pulse 88   Ht 5\' 6"  (1.676 m)   Wt 141 lb (64 kg)   SpO2 95%   BMI 22.76 kg/m    Subjective:   Patient ID: Laura Dalton, female    DOB: [birth year], 73 y.o.   MRN: [id number]  HPI: Laura Dalton is a 73 y.o. female presenting on 08/10/2023 for Hospitalization Follow-up (Pneumonia)   HPI Hospital follow-up Patient is coming in today for hospital follow-up.  She was in the hospital on 07/03/2023 and discharged on 07/10/2023.  She was admitted for respiratory distress and was diagnosed with pneumonia and Takotsubo cardiomyopathy and CHF.  They recommended follow-up with cardiology, she normally sees Dr. Amanda Jungling and encouraged to give a call and get an appointment with him.  She says her breathing feels like she is back to normal.  Her energy is still down and she feels like she is weaker after being in the hospital for that time but she does feel like her breathing is back to her baseline and she is getting around good and her swelling is gone.  She denies any shortness of breath or wheezing today.  Relevant past medical, surgical, family and social history reviewed and updated as indicated. Interim medical history since our last visit reviewed. Allergies and medications reviewed and updated.  Review of Systems  Constitutional:  Negative for chills and fever.  HENT:  Negative for congestion, ear discharge and ear pain.   Eyes:  Negative for redness and visual disturbance.  Respiratory:  Negative for cough, chest tightness, shortness of breath, wheezing and stridor.   Cardiovascular:  Negative for chest pain and leg swelling.  Genitourinary:  Negative for difficulty urinating and dysuria.  Musculoskeletal:  Negative for back pain and gait problem.  Skin:  Negative for rash.  Neurological:  Positive for weakness. Negative for light-headedness and headaches.  Psychiatric/Behavioral:  Negative for agitation and behavioral problems.   All other systems reviewed and are  negative.   Per HPI unless specifically indicated above   Allergies as of 08/10/2023       Reactions   Aspirin  Hives   Gabapentin  Other (See Comments)   Dizzy,falls        Medication List        Accurate as of Aug 10, 2023 10:12 AM. If you have any questions, ask your nurse or doctor.          albuterol  108 (90 Base) MCG/ACT inhaler Commonly known as: VENTOLIN  HFA Inhale 2 puffs into the lungs every 4-6 hours as needed for wheezing or shortness of breath.   alendronate  70 MG tablet Commonly known as: FOSAMAX  TAKE 1 TABLET BY MOUTH WEEKLY  WITH 8 OZ OF PLAIN WATER 30  MINUTES BEFORE FIRST FOOD, DRINK OR MEDS. STAY UPRIGHT FOR 30  MINS   amitriptyline  50 MG tablet Commonly known as: ELAVIL  Take 1 tablet (50 mg total) by mouth at bedtime.   Anoro Ellipta  62.5-25 MCG/ACT Aepb Generic drug: umeclidinium-vilanterol Inhale 1 puff into the lungs daily.   atorvastatin  40 MG tablet Commonly known as: LIPITOR Take 1 tablet (40 mg total) by mouth daily.   calcium  carbonate 1500 (600 Ca) MG Tabs tablet Commonly known as: OSCAL Take 1 tablet (1,500 mg total) by mouth 2 (two) times daily with a meal.   carvedilol  3.125 MG tablet Commonly known as: COREG  Take 1 tablet (3.125 mg total) by mouth 2 (two) times daily with a meal.   clopidogrel  75 MG  tablet Commonly known as: PLAVIX  Take 1 tablet (75 mg total) by mouth daily.   dapagliflozin  propanediol 10 MG Tabs tablet Commonly known as: FARXIGA  Take 1 tablet (10 mg total) by mouth daily.   donepezil  10 MG tablet Commonly known as: ARICEPT  Take 1 tablet (10 mg total) by mouth at bedtime.   donepezil  10 MG tablet Commonly known as: ARICEPT  Take 1 tablet (10 mg total) by mouth at bedtime.   DULoxetine  60 MG capsule Commonly known as: CYMBALTA  Take 1 capsule (60 mg total) by mouth daily.   feeding supplement Liqd Take 237 mLs by mouth 2 (two) times daily between meals.   levothyroxine  125 MCG tablet Commonly  known as: SYNTHROID  Take 1 tablet (125 mcg total) by mouth daily before breakfast.   losartan  25 MG tablet Commonly known as: COZAAR  Take 1 tablet (25 mg total) by mouth daily.   spironolactone  25 MG tablet Commonly known as: ALDACTONE  Take 0.5 tablets (12.5 mg total) by mouth daily.   triamcinolone  cream 0.1 % Commonly known as: KENALOG  Apply 1 application topically 2 (two) times daily. What changed:  when to take this reasons to take this         Objective:   BP 132/70   Pulse 88   Ht 5\' 6"  (1.676 m)   Wt 141 lb (64 kg)   SpO2 95%   BMI 22.76 kg/m   Wt Readings from Last 3 Encounters:  08/10/23 141 lb (64 kg)  07/17/23 145 lb 6.4 oz (66 kg)  07/08/23 150 lb (68 kg)    Physical Exam Vitals and nursing note reviewed.  Constitutional:      General: She is not in acute distress.    Appearance: She is well-developed. She is not diaphoretic.  Eyes:     Conjunctiva/sclera: Conjunctivae normal.  Cardiovascular:     Rate and Rhythm: Normal rate and regular rhythm.     Heart sounds: Normal heart sounds. No murmur heard. Pulmonary:     Effort: Pulmonary effort is normal. No respiratory distress.     Breath sounds: Normal breath sounds. No wheezing, rhonchi or rales.  Musculoskeletal:        General: No swelling.  Skin:    General: Skin is warm and dry.     Findings: No rash.  Neurological:     Mental Status: She is alert and oriented to person, place, and time.     Coordination: Coordination normal.  Psychiatric:        Behavior: Behavior normal.       Assessment & Plan:   Problem List Items Addressed This Visit       Respiratory   Acute respiratory failure with hypoxia and hypercapnia (HCC) - Primary   Relevant Orders   CBC with Differential/Platelet   CMP14+EGFR   DG Chest 2 View   Other Visit Diagnoses       Acute on chronic combined systolic and diastolic congestive heart failure (HCC)           Recommended that she increase protein and  start doing some light exercises and leg raises and arm raises at home to build muscle mass back. Follow up plan: Return if symptoms worsen or fail to improve.  Counseling provided for all of the vaccine components Orders Placed This Encounter  Procedures   DG Chest 2 View   CBC with Differential/Platelet   CMP14+EGFR    Jolyne Needs, MD Vickie Grana [facility] 08/10/2023, 10:12 AM

## 2023-08-10 NOTE — Patient Instructions (Signed)
 Visit Information  Thank you for taking time to visit with me today. Please don't hesitate to contact me if I can be of assistance to you before our next scheduled appointment.  Your next care management appointment is by telephone on 09-07-2023 at 10:30 am  Telephone follow-up in 1 month  Please call the care guide team at [phone] if you need to cancel, schedule, or reschedule an appointment.   Please call the Suicide and Crisis Lifeline: 988 call the [place]  National Suicide Prevention Lifeline: 604 685 7546 or TTY: 402-527-2803 TTY (425)557-3750) to talk to a trained counselor call 1-800-273-TALK (toll free, 24 hour hotline) call the [facility]: [phone] call 911 if you are experiencing a Mental Health or Behavioral Health Crisis or need someone to talk to.  Grandville Lax, BSN RN [place]  [facility], [facility] RN Care Manager Direct Dial: [phone]  Fax: [phone]  Heart Failure Action Plan A heart failure action plan helps you know what to do when you have symptoms of heart failure. Your action plan is a color-coded plan that lists the symptoms to watch for and indicates what actions to take. If you have symptoms in the green zone, you're doing well. If you have symptoms in the yellow zone, you're having problems. If you have symptoms in the red zone, you need medical care right away. Follow the plan that was created by you and your health care provider. Review your plan each time you visit your provider. Green zone These signs mean you're doing well and can continue what you're doing: You don't have new or worsening shortness of breath. You have very little swelling or no new swelling. Your weight is stable (no gain or loss). You have a normal activity level. You don't have chest pain or any other new symptoms. Yellow zone These signs and symptoms mean your condition may be getting worse and you should make some  changes: You have trouble breathing when you're active. You have swelling in your feet or legs or have discomfort in your belly. You gain 2-3 lb (0.9-1.4 kg) in 24 hours, or 5 lb (2.3 kg) in a week. This amount may be more or less depending on your condition. You get tired easily. You have trouble sleeping. You have a dry cough. If you have any of these symptoms: Contact your provider within the next day. Your provider may adjust your medicines. Red zone These signs and symptoms mean you should get medical help right away: You have trouble breathing when resting or cannot lie flat and you need to raise your head to help you breathe. You have a dry cough that's getting worse. You have swelling or pain in your feet or legs or discomfort in your belly that's getting worse. You suddenly gain more than 2-3 lb (0.9-1.4 kg) in 24 hours, or more than 5 lb (2.3 kg) in a week. This amount may be more or less depending on your condition. You have trouble staying awake or you feel confused. You don't have an appetite. You have worsening sadness or depression. These symptoms may be an emergency. Call 911 right away. Do not wait to see if the symptoms will go away. Do not drive yourself to the hospital. Follow these instructions at home: Take medicines only as told. Eat a heart-healthy diet. Work with a dietitian to create an eating plan that's best for you. Weigh yourself each day. Your target weight is __________ lb (__________ kg). Call your provider if you  gain more than __________ lb (__________ kg) in 24 hours, or more than __________ lb (__________ kg) in a week. Health care provider name: _____________________________________________________ Health care provider phone number: _____________________________________________________ Where to find more information American Heart Association: heart.org This information is not intended to replace advice given to you by your health care provider.  Make sure you discuss any questions you have with your health care provider. Document Revised: 10/12/2022 Document Reviewed: 10/12/2022 Elsevier Patient Education  2024 [employer].

## 2023-08-11 LAB — CBC WITH DIFFERENTIAL/PLATELET
Basophils Absolute: 0 10*3/uL (ref 0.0–0.2)
Basos: 0 %
EOS (ABSOLUTE): 0.2 10*3/uL (ref 0.0–0.4)
Eos: 2 %
Hematocrit: 42.6 % (ref 34.0–46.6)
Hemoglobin: 13.8 g/dL (ref 11.1–15.9)
Immature Grans (Abs): 0 10*3/uL (ref 0.0–0.1)
Immature Granulocytes: 0 %
Lymphocytes Absolute: 1.6 10*3/uL (ref 0.7–3.1)
Lymphs: 15 %
MCH: 27.5 pg (ref 26.6–33.0)
MCHC: 32.4 g/dL (ref 31.5–35.7)
MCV: 85 fL (ref 79–97)
Monocytes Absolute: 1 10*3/uL — ABNORMAL HIGH (ref 0.1–0.9)
Monocytes: 10 %
Neutrophils Absolute: 7.8 10*3/uL — ABNORMAL HIGH (ref 1.4–7.0)
Neutrophils: 73 %
Platelets: 376 10*3/uL (ref 150–450)
RBC: 5.02 x10E6/uL (ref 3.77–5.28)
RDW: 13.4 % (ref 11.7–15.4)
WBC: 10.5 10*3/uL (ref 3.4–10.8)

## 2023-08-11 LAB — CMP14+EGFR
ALT: 8 IU/L (ref 0–32)
AST: 16 IU/L (ref 0–40)
Albumin: 4.7 g/dL (ref 3.8–4.8)
Alkaline Phosphatase: 86 IU/L (ref 44–121)
BUN/Creatinine Ratio: 11 — ABNORMAL LOW (ref 12–28)
BUN: 8 mg/dL (ref 8–27)
Bilirubin Total: 0.3 mg/dL (ref 0.0–1.2)
CO2: 20 mmol/L (ref 20–29)
Calcium: 10 mg/dL (ref 8.7–10.3)
Chloride: 104 mmol/L (ref 96–106)
Creatinine, Ser: 0.71 mg/dL (ref 0.57–1.00)
Globulin, Total: 2 g/dL (ref 1.5–4.5)
Glucose: 116 mg/dL — ABNORMAL HIGH (ref 70–99)
Potassium: 4.1 mmol/L (ref 3.5–5.2)
Sodium: 142 mmol/L (ref 134–144)
Total Protein: 6.7 g/dL (ref 6.0–8.5)
eGFR: 90 mL/min/{1.73_m2} (ref >59)

## 2023-08-17 ENCOUNTER — Other Ambulatory Visit: Payer: Self-pay

## 2023-08-17 ENCOUNTER — Other Ambulatory Visit (HOSPITAL_BASED_OUTPATIENT_CLINIC_OR_DEPARTMENT_OTHER): Payer: Self-pay

## 2023-08-17 ENCOUNTER — Ambulatory Visit: Payer: Self-pay | Admitting: Family Medicine

## 2023-08-17 ENCOUNTER — Other Ambulatory Visit (HOSPITAL_COMMUNITY): Payer: Self-pay

## 2023-08-17 MED ORDER — LOSARTAN POTASSIUM 25 MG PO TABS
25.0000 mg | ORAL_TABLET | Freq: Every day | ORAL | 1 refills | Status: AC
  Filled 2023-08-17 – 2023-10-19 (×2): qty 90, 90d supply, fill #0
  Filled 2023-12-14 – 2023-12-18 (×2): qty 90, 90d supply, fill #1

## 2023-08-31 ENCOUNTER — Other Ambulatory Visit: Payer: Self-pay

## 2023-08-31 ENCOUNTER — Encounter: Payer: Self-pay | Admitting: Nurse Practitioner

## 2023-08-31 ENCOUNTER — Telehealth: Payer: Self-pay | Admitting: Family Medicine

## 2023-08-31 ENCOUNTER — Ambulatory Visit: Attending: Nurse Practitioner | Admitting: Nurse Practitioner

## 2023-08-31 ENCOUNTER — Ambulatory Visit: Admitting: Nurse Practitioner

## 2023-08-31 VITALS — BP 112/60 | HR 62 | Ht 66.0 in | Wt 140.0 lb

## 2023-08-31 DIAGNOSIS — I428 Other cardiomyopathies: Secondary | ICD-10-CM | POA: Diagnosis not present

## 2023-08-31 DIAGNOSIS — I5020 Unspecified systolic (congestive) heart failure: Secondary | ICD-10-CM

## 2023-08-31 DIAGNOSIS — J449 Chronic obstructive pulmonary disease, unspecified: Secondary | ICD-10-CM

## 2023-08-31 DIAGNOSIS — E119 Type 2 diabetes mellitus without complications: Secondary | ICD-10-CM

## 2023-08-31 DIAGNOSIS — J4520 Mild intermittent asthma, uncomplicated: Secondary | ICD-10-CM

## 2023-08-31 DIAGNOSIS — Z794 Long term (current) use of insulin: Secondary | ICD-10-CM

## 2023-08-31 MED ORDER — ALBUTEROL SULFATE HFA 108 (90 BASE) MCG/ACT IN AERS
2.0000 | INHALATION_SPRAY | RESPIRATORY_TRACT | 0 refills | Status: DC | PRN
  Filled 2023-08-31: qty 6.7, 25d supply, fill #0

## 2023-08-31 NOTE — Telephone Encounter (Signed)
Contacted patient. Notified patient. Patient verbalized understanding 

## 2023-08-31 NOTE — Telephone Encounter (Signed)
 Sent albuterol for the patient

## 2023-08-31 NOTE — Progress Notes (Unsigned)
  Cardiology Office Note   Date:  08/31/2023  ID:  Laura Dalton, Laura Dalton 1950/10/11, MRN [id number] PCP: Dettinger, Lucio Sabin, MD  [facility] Providers Cardiologist:  Armida Lander, MD { Click to update primary MD,subspecialty MD or APP then REFRESH:1}    History of Present Illness Laura Dalton is a 73 y.o. female with a PMH of HFrEF, T2DM, hypothyroidism, COPD, who presents today for CHF follow-up.   Hospitalized 06/2023 with new onset HFrEF, CAP. Tx with IV Lasix , nebs, and ABX. Echo revealed EF 25%, findings suggestive of Takotsubo cardiomyopathy.  Underwent right and left heart cath that showed normal coronary arteries, normal filling pressures, preserved CI.  Seen by heart failure clinic on Jul 17, 2023 for posthospital heart failure follow-up.  She was feeling fine.  Weight was stable.  GDMT limited at the time due to her history of falls and dizziness.  Recommended repeating echocardiogram in 3 months.  Today she presents for scheduled follow-up.  She states  Hx of falls.   ROS: ***  Studies Reviewed      *** Risk Assessment/Calculations {Does this patient have ATRIAL FIBRILLATION?:[phone]} No BP recorded.  {Refresh Note OR Click here to enter BP  :1}***       Physical Exam VS:  There were no vitals taken for this visit.       Wt Readings from Last 3 Encounters:  08/10/23 141 lb (64 kg)  08/10/23 141 lb (64 kg)  07/17/23 145 lb 6.4 oz (66 kg)    GEN: Well nourished, well developed in no acute distress NECK: No JVD; No carotid bruits CARDIAC: ***RRR, no murmurs, rubs, gallops RESPIRATORY:  Clear to auscultation without rales, wheezing or rhonchi  ABDOMEN: Soft, non-tender, non-distended EXTREMITIES:  No edema; No deformity   ASSESSMENT AND PLAN ***    {Are you ordering a CV Procedure (e.g. stress test, cath, DCCV, TEE, etc)?   Press F2        :[id number]}  Dispo: ***  Signed, Lasalle Pointer, NP

## 2023-08-31 NOTE — Patient Instructions (Signed)

## 2023-09-07 ENCOUNTER — Other Ambulatory Visit: Payer: Self-pay

## 2023-09-07 ENCOUNTER — Other Ambulatory Visit: Payer: Self-pay | Admitting: *Deleted

## 2023-09-07 ENCOUNTER — Other Ambulatory Visit (HOSPITAL_COMMUNITY): Payer: Self-pay

## 2023-09-07 VITALS — Wt 140.0 lb

## 2023-09-07 DIAGNOSIS — I5022 Chronic systolic (congestive) heart failure: Secondary | ICD-10-CM

## 2023-09-07 NOTE — Patient Outreach (Signed)
 Complex Care Management   Visit Note  09/07/2023  Name:  Laura Dalton MRN: [id number] DOB: May 27, 1950  Situation: Referral received for Complex Care Management related to Heart Failure and COPD I obtained verbal consent from Patient.  Visit completed with patient  on the phone  Background:   Past Medical History:  Diagnosis Date   Bursitis of hip    Diabetes mellitus without complication (HCC)    type II   Hyperlipidemia    Hypertension    Hypothyroidism    Low back pain    benign turmor back   Stroke (HCC)    Vitamin D  deficiency disease     Assessment: Patient Reported Symptoms:  Cognitive Cognitive Status: Able to follow simple commands, Alert and oriented to person, place, and time, Normal speech and language skills Cognitive/Intellectual Conditions Management [RPT]: None reported or documented in medical history or problem list   Health Maintenance Behaviors: Annual physical exam Healing Pattern: Average Health Facilitated by: Rest  Neurological Neurological Review of Symptoms: No symptoms reported    HEENT HEENT Symptoms Reported: Runny nose HEENT Management Strategies: Routine screening    Cardiovascular Cardiovascular Symptoms Reported: No symptoms reported Cardiovascular Conditions: Heart failure Weight: 140 lb (63.5 kg)  Respiratory Respiratory Symptoms Reported: Productive cough Respiratory Conditions: Shortness of breath, COPD  Endocrine Patient reports the following symptoms related to hypoglycemia or hyperglycemia : No symptoms reported Is patient diabetic?: No Is patient checking blood sugars at home?: No Endocrine Conditions: Diabetes, Thyroid  disorder Endocrine Management Strategies: Medication therapy  Gastrointestinal Gastrointestinal Symptoms Reported: No symptoms reported      Genitourinary Genitourinary Symptoms Reported: No symptoms reported    Integumentary Integumentary Symptoms Reported: No symptoms reported    Musculoskeletal  Musculoskelatal Symptoms Reviewed: Unsteady gait Musculoskeletal Conditions: Unsteady gait, Mobility limited Musculoskeletal Management Strategies: Routine screening Falls in the past year?: Yes Number of falls in past year: 2 or more Was there an injury with Fall?: Yes Fall Risk Category Calculator: 3 Patient Fall Risk Level: High Fall Risk Patient at Risk for Falls Due to: History of fall(s), Impaired balance/gait, Impaired mobility Fall risk Follow up: Falls evaluation completed, Education provided, Falls prevention discussed  Psychosocial Psychosocial Symptoms Reported: No symptoms reported   Major Change/Loss/Stressor/Fears (CP): Denies Techniques to Cope with Loss/Stress/Change: Not applicable Quality of Family Relationships: helpful, involved, supportive Do you feel physically threatened by others?: No      09/07/2023   11:12 AM  Depression screen PHQ 2/9  Decreased Interest 0  Down, Depressed, Hopeless 0  PHQ - 2 Score 0    There were no vitals filed for this visit.  Medications Reviewed Today     Reviewed by Bertrum Rosina HERO, RN (Registered Nurse) on 09/07/23 at 1057  Med List Status: <None>   Medication Order Taking? Sig Documenting Provider Last Dose Status Informant  albuterol  (VENTOLIN  HFA) 108 (90 Base) MCG/ACT inhaler [id number] Yes Inhale 2 puffs into the lungs every 4-6 hours as needed for wheezing or shortness of breath. Dettinger, Fonda LABOR, MD  Active   alendronate  (FOSAMAX ) 70 MG tablet [id number] Yes TAKE 1 TABLET BY MOUTH WEEKLY  WITH 8 OZ OF PLAIN WATER 30  MINUTES BEFORE FIRST FOOD, DRINK OR MEDS. STAY UPRIGHT FOR 30  MINS Dettinger, Fonda LABOR, MD  Active Self, Pharmacy Records, Multiple Informants, Care Giver  amitriptyline  (ELAVIL ) 50 MG tablet [id number] Yes Take 1 tablet (50 mg total) by mouth at bedtime. Dettinger, Fonda LABOR, MD  Active Self, Pharmacy Records, Multiple Informants,  Care Giver  atorvastatin  (LIPITOR) 40 MG tablet [id number] Yes Take 1 tablet  (40 mg total) by mouth daily. [place], [place], FNP  Active   calcium  carbonate (OSCAL) 1500 (600 Ca) MG TABS tablet [id number] Yes Take 1 tablet (1,500 mg total) by mouth 2 (two) times daily with a meal. Maree Bracken D, DO  Active Self, Pharmacy Records, Multiple Informants, Care Giver  carvedilol  (COREG ) 3.125 MG tablet [id number] Yes Take 1 tablet (3.125 mg total) by mouth 2 (two) times daily with a meal. Glena Harlene HERO, [place]  Active   clopidogrel  (PLAVIX ) 75 MG tablet [id number] Yes Take 1 tablet (75 mg total) by mouth daily. Dettinger, Fonda LABOR, MD  Active Self, Pharmacy Records, Multiple Informants, Care Giver  dapagliflozin  propanediol (FARXIGA ) 10 MG TABS tablet [id number] Yes Take 1 tablet (10 mg total) by mouth daily. [place], [place], [place]  Active   donepezil  (ARICEPT ) 10 MG tablet [id number]  Take 1 tablet (10 mg total) by mouth at bedtime. Dettinger, Fonda LABOR, MD  Active Self, Pharmacy Records, Multiple Informants, Care Giver           Med Note STEFFI, [place]   Wed Jul 04, 2023  1:52 PM)    donepezil  (ARICEPT ) 10 MG tablet [id number] Yes Take 1 tablet (10 mg total) by mouth at bedtime. Dettinger, Fonda LABOR, MD  Active   DULoxetine  (CYMBALTA ) 60 MG capsule [id number] Yes Take 1 capsule (60 mg total) by mouth daily. Dettinger, Fonda LABOR, MD  Active Self, Pharmacy Records, Multiple Informants, Care Giver           Med Note CARLEEN, Molena D   Wed Jul 04, 2023  7:37 AM)    feeding supplement (ENSURE ENLIVE / ENSURE PLUS) LIQD [id number] Yes Take 237 mLs by mouth 2 (two) times daily between meals. Gonfa, Taye T, MD  Active   levothyroxine  (SYNTHROID ) 125 MCG tablet [id number] Yes Take 1 tablet (125 mcg total) by mouth daily before breakfast. Dettinger, Fonda LABOR, MD  Active Self, Pharmacy Records, Multiple Informants, Care Giver  losartan  (COZAAR ) 25 MG tablet [id number] Yes Take 1 tablet (25 mg total) by mouth daily. Dettinger, Fonda LABOR, MD  Active   spironolactone  (ALDACTONE ) 25 MG  tablet [id number]  Take 0.5 tablets (12.5 mg total) by mouth daily.  Patient not taking: Reported on 09/07/2023   Glena Harlene HERO, FNP  Active   triamcinolone  cream (KENALOG ) 0.1 % [id number] Yes Apply 1 application topically 2 (two) times daily.  Patient taking differently: Apply 1 application  topically 2 (two) times daily as needed (Dermatitis).   Dettinger, Fonda LABOR, MD  Active Self, Pharmacy Records, Multiple Informants, Care Giver  umeclidinium-vilanterol (ANORO ELLIPTA ) 62.5-25 MCG/ACT AEPB [id number] Yes Inhale 1 puff into the lungs daily. Gonfa, Taye T, MD  Active             Recommendation:   Continue Current Plan of Care  Follow Up Plan:   Telephone follow-up in 1 month Referral to Pharmacist: Adherence packaging.   Rosina Forte, BSN RN [facility], [facility] Health RN Care Manager Direct Dial: [phone]  Fax: [phone]

## 2023-09-07 NOTE — Patient Instructions (Signed)
 Visit Information  Thank you for taking time to visit with me today. Please don't hesitate to contact me if I can be of assistance to you before our next scheduled appointment.  Your next care management appointment is by telephone on 10-05-2023 at 10:30 am  Telephone follow-up in 1 month  Please call the care guide team at [phone] if you need to cancel, schedule, or reschedule an appointment.   Please call the Suicide and Crisis Lifeline: 988 call the [place]  National Suicide Prevention Lifeline: (214)148-7866 or TTY: 3215739076 TTY 340-831-7189) to talk to a trained counselor call 1-800-273-TALK (toll free, 24 hour hotline) call the [facility]: 775-364-5783 call 911 if you are experiencing a Mental Health or Behavioral Health Crisis or need someone to talk to.  Rosina Forte, BSN RN [facility], [facility] Health RN Care Manager Direct Dial: [phone]  Fax: [phone]

## 2023-09-10 ENCOUNTER — Telehealth: Payer: Self-pay

## 2023-09-10 NOTE — Progress Notes (Signed)
 Care Guide Pharmacy Note  09/10/2023 Name: Laura Dalton MRN: [id number] DOB: [birth year]  Referred By: Maryanne Fonda LABOR, MD Reason for referral: Complex Care Management (Outreach to schedule with Pharm d )   Laura Dalton is a 73 y.o. year old female who is a primary care patient of Dettinger, Fonda LABOR, MD.  Corrin Lawrnce Potters was referred to the pharmacist for assistance related to: Med adherence packaging   Successful contact was made with the patient to discuss pharmacy services including being ready for the pharmacist to call at least 5 minutes before the scheduled appointment time and to have medication bottles and any blood pressure readings ready for review. The patient agreed to meet with the pharmacist via telephone visit on (date/time).10/09/2023  Jeoffrey Buffalo , RMA     [place]  [facility], [facility] Guide  Direct Dial: [phone]  Website: .[email or web address]

## 2023-09-17 ENCOUNTER — Other Ambulatory Visit: Payer: Self-pay | Admitting: Family Medicine

## 2023-09-17 DIAGNOSIS — J4520 Mild intermittent asthma, uncomplicated: Secondary | ICD-10-CM

## 2023-09-17 NOTE — Telephone Encounter (Unsigned)
 Copied from CRM [id number]. Topic: Clinical - Medication Refill >> Sep 17, 2023  3:56 PM Delon T wrote: Medication: albuterol  (VENTOLIN  HFA) 108 (90 Base) MCG/ACT inhaler  Has the patient contacted their pharmacy? No (Agent: If no, request that the patient contact the pharmacy for the refill. If patient does not wish to contact the pharmacy document the reason why and proceed with request.) (Agent: If yes, when and what did the pharmacy advise?)  This is the patient's preferred pharmacy:  [facility] [address] [place] [address] Phone: [phone] Fax: [phone]    Is this the correct pharmacy for this prescription? Yes If no, delete pharmacy and type the correct one.   Has the prescription been filled recently? Yes  Is the patient out of the medication? Yes  Has the patient been seen for an appointment in the last year OR does the patient have an upcoming appointment? Yes  Can we respond through MyChart? Yes  Agent: Please be advised that Rx refills may take up to 3 business days. We ask that you follow-up with your pharmacy.

## 2023-09-19 ENCOUNTER — Other Ambulatory Visit: Payer: Self-pay

## 2023-09-19 ENCOUNTER — Other Ambulatory Visit (HOSPITAL_COMMUNITY): Payer: Self-pay

## 2023-09-19 MED ORDER — ALBUTEROL SULFATE HFA 108 (90 BASE) MCG/ACT IN AERS
2.0000 | INHALATION_SPRAY | RESPIRATORY_TRACT | 1 refills | Status: DC | PRN
  Filled 2023-09-19: qty 6.7, 17d supply, fill #0
  Filled 2023-10-03: qty 6.7, 17d supply, fill #1

## 2023-09-21 ENCOUNTER — Ambulatory Visit: Payer: Self-pay

## 2023-09-21 NOTE — Telephone Encounter (Signed)
 Yes if she is starting to have a flare of her COPD she needs to be seen, if she cannot be seen or cannot wait to be seen then she will likely need to have to go to urgent care.

## 2023-09-21 NOTE — Telephone Encounter (Signed)
 Spoke with patient regarding this call. Patient says she has her inhaler which does help but she would like PCP to also prescribe her a pill for her COPD (unsure of name of it at the moment). I offered her an appt to see PCP on 09/26/23 (first available) at 8:00 am and patient declined. She says that's too early for her. I explained to her that PCP doesn't have anything else until 10/31/23. Patient declined to make an appt and said she would wait to see if PCP had any cancellations.   I advised patient that if she started having a flare up with her COPD and her inhaler wasn't helped, she needed to go to the ER. Patient voiced understanding.

## 2023-09-21 NOTE — Telephone Encounter (Signed)
 I contacted patient and checked in on her.  She is doing better with nebulizer treatment. I advised patient to seek medical attention at urgent care or ED if she has any strong flare ups.

## 2023-09-21 NOTE — Telephone Encounter (Signed)
 FYI Only or Action Required?: Action required by provider: update on patient condition.  Patient was last seen in primary care on 08/10/2023 by Dettinger, Fonda LABOR, MD.  Called Nurse Triage reporting Shortness of Breath.  Symptoms began several weeks ago.  Interventions attempted: Prescription medications: Inhalers.  Symptoms are: gradually worsening.  Triage Disposition: See HCP Within 4 Hours (Or PCP Triage)-Pt declined appt for today, RN then recommended ED and patient declined that as well. Transferred to [place].   Patient/caregiver understands and will follow disposition?: Unsure     Copied from CRM [id number]. Topic: Clinical - Medication Question >> Sep 21, 2023 12:32 PM Tobias L wrote: Reason for CRM: Patient requesting rx for COPD, patient states she does not know the name of the rx just that it is a medication for COPD.   Patient requesting call back, [phone] Reason for Disposition  [1] Longstanding difficulty breathing (e.g., CHF, COPD, emphysema) AND [2] WORSE than normal  Answer Assessment - Initial Assessment Questions 1. RESPIRATORY STATUS: Describe your breathing? (e.g., wheezing, shortness of breath, unable to speak, severe coughing)      Shortness of breath and wheezing  2. ONSET: When did this breathing problem begin?      Off/ on shortness of breath and getting worse over the past month  3. PATTERN Does the difficult breathing come and go, or has it been constant since it started?      Comes and goes  4. SEVERITY: How bad is your breathing? (e.g., mild, moderate, severe)      Mild to moderate  5. RECURRENT SYMPTOM: Have you had difficulty breathing before? If Yes, ask: When was the last time? and What happened that time?      No  6. CARDIAC HISTORY: Do you have any history of heart disease? (e.g., heart attack, angina, bypass surgery, angioplasty)      CHF  7. LUNG HISTORY: Do you have any history of lung disease?  (e.g., pulmonary  embolus, asthma, emphysema)     COPD  8. CAUSE: What do you think is causing the breathing problem?      Unsure  9. OTHER SYMPTOMS: Do you have any other symptoms? (e.g., chest pain, cough, dizziness, fever, runny nose)     Poor appetite, fatigue  10. O2 SATURATION MONITOR:  Do you use an oxygen saturation monitor (pulse oximeter) at home? If Yes, ask: What is your reading (oxygen level) today? What is your usual oxygen saturation reading? (e.g., 95%)       No  11. PREGNANCY: Is there any chance you are pregnant? When was your last menstrual period?       No  12. TRAVEL: Have you traveled out of the country in the last month? (e.g., travel history, exposures)       no  Protocols used: Breathing Difficulty-A-AH

## 2023-10-03 ENCOUNTER — Other Ambulatory Visit (HOSPITAL_COMMUNITY): Payer: Self-pay

## 2023-10-03 ENCOUNTER — Other Ambulatory Visit: Payer: Self-pay

## 2023-10-05 ENCOUNTER — Telehealth: Admitting: *Deleted

## 2023-10-09 ENCOUNTER — Telehealth: Payer: Self-pay | Admitting: Family Medicine

## 2023-10-09 ENCOUNTER — Other Ambulatory Visit

## 2023-10-09 NOTE — Telephone Encounter (Unsigned)
 Copied from CRM [id number]. Topic: Appointments - Scheduling Inquiry for Clinic >> Oct 09, 2023  1:44 PM Tobias L wrote: Reason for CRM: Patient calling as she missed call from today's appt with pharmacist. Pt requesting to be rescheduled, please reach out to patient

## 2023-10-19 ENCOUNTER — Other Ambulatory Visit: Payer: Self-pay

## 2023-10-19 ENCOUNTER — Other Ambulatory Visit: Payer: Self-pay | Admitting: *Deleted

## 2023-10-19 ENCOUNTER — Other Ambulatory Visit (HOSPITAL_COMMUNITY): Payer: Self-pay

## 2023-10-19 MED FILL — Duloxetine HCl Enteric Coated Pellets Cap 60 MG (Base Eq): ORAL | 90 days supply | Qty: 90 | Fill #0 | Status: AC

## 2023-10-19 MED FILL — Clopidogrel Bisulfate Tab 75 MG (Base Equiv): ORAL | 70 days supply | Qty: 70 | Fill #0 | Status: AC

## 2023-10-19 MED FILL — Amitriptyline HCl Tab 50 MG: ORAL | 30 days supply | Qty: 30 | Fill #0 | Status: AC

## 2023-10-19 MED FILL — Levothyroxine Sodium Tab 125 MCG: ORAL | 70 days supply | Qty: 70 | Fill #0 | Status: AC

## 2023-10-19 MED FILL — Alendronate Sodium Tab 70 MG: ORAL | 28 days supply | Qty: 4 | Fill #0 | Status: AC

## 2023-10-19 NOTE — Patient Outreach (Signed)
 Complex Care Management   Visit Note  10/19/2023  Name:  Laura Dalton MRN: [id number] DOB: 04/12/1950  Situation: Referral received for Complex Care Management related to Heart Failure and COPD I obtained verbal consent from Patient.  Visit completed with Patient  on the phone  Background:   Past Medical History:  Diagnosis Date   Bursitis of hip    Diabetes mellitus without complication (HCC)    type II   Hyperlipidemia    Hypertension    Hypothyroidism    Low back pain    benign turmor back   Stroke (HCC)    Vitamin D  deficiency disease     Assessment: Patient Reported Symptoms:  Cognitive Cognitive Status: Alert and oriented to person, place, and time, Insightful and able to interpret abstract concepts, Normal speech and language skills Cognitive/Intellectual Conditions Management [RPT]: None reported or documented in medical history or problem list   Health Maintenance Behaviors: Annual physical exam, Spiritual practice(s), Stress management, Healthy diet, Sleep adequate Healing Pattern: Average Health Facilitated by: Healthy diet, Prayer/meditation, Rest, Stress management  Neurological Neurological Review of Symptoms: No symptoms reported Neurological Management Strategies: Medication therapy, Routine screening Neurological Self-Management Outcome: 4 (good)  HEENT HEENT Symptoms Reported: Nasal discharge HEENT Management Strategies: Routine screening, Medication therapy HEENT Self-Management Outcome: 4 (good)    Cardiovascular Cardiovascular Symptoms Reported: No symptoms reported Cardiovascular Management Strategies: Routine screening, Medication therapy, Weight management, Diet modification, Adequate rest Cardiovascular Self-Management Outcome: 3 (uncertain)  Respiratory Respiratory Symptoms Reported: Productive cough Other Respiratory Symptoms: Patient with a congested cough.  Reports that sputum is clear and she is able to expectorant Respiratory Management  Strategies: Adequate rest, Medication therapy, Weight management, Routine screening Respiratory Self-Management Outcome: 3 (uncertain)  Endocrine Endocrine Symptoms Reported: No symptoms reported Is patient diabetic?: No Is patient checking blood sugars at home?: No Endocrine Self-Management Outcome: 4 (good)  Gastrointestinal Gastrointestinal Symptoms Reported: No symptoms reported Gastrointestinal Management Strategies: Medication therapy Gastrointestinal Self-Management Outcome: 4 (good) Nutrition Risk Screen (CP): No indicators present  Genitourinary Genitourinary Symptoms Reported: No symptoms reported Genitourinary Management Strategies: Medication therapy Genitourinary Self-Management Outcome: 4 (good)  Integumentary Integumentary Symptoms Reported: No symptoms reported Skin Management Strategies: Routine screening Skin Self-Management Outcome: 4 (good)  Musculoskeletal Musculoskelatal Symptoms Reviewed: Back pain Additional Musculoskeletal Details: Patient reports that she uses a walker Musculoskeletal Management Strategies: Routine screening Musculoskeletal Self-Management Outcome: 4 (good) Falls in the past year?: Yes Number of falls in past year: 2 or more Was there an injury with Fall?: Yes Fall Risk Category Calculator: 3 Patient Fall Risk Level: High Fall Risk Patient at Risk for Falls Due to: History of fall(s), Impaired balance/gait, Impaired mobility Fall risk Follow up: Falls evaluation completed, Falls prevention discussed  Psychosocial Psychosocial Symptoms Reported: No symptoms reported Behavioral Management Strategies: Support system Behavioral Health Self-Management Outcome: 4 (good) Major Change/Loss/Stressor/Fears (CP): Denies Quality of Family Relationships: helpful, supportive, involved Do you feel physically threatened by others?: No      10/19/2023    4:19 PM  Depression screen PHQ 2/9  Decreased Interest 0  Down, Depressed, Hopeless 0  PHQ - 2 Score  0    There were no vitals filed for this visit.  Medications Reviewed Today   Medications were not reviewed in this encounter     Recommendation:   Continue Current Plan of Care  Follow Up Plan:   Telephone follow-up in 1 month  Amarilys Lyles, RN, BSN, Theatre manager [employer] [phone]

## 2023-10-19 NOTE — Patient Instructions (Signed)
 Visit Information  Thank you for taking time to visit with me today. Please don't hesitate to contact me if I can be of assistance to you before our next scheduled appointment.  Your next care management appointment is by telephone on 11-19-23 at 1 pm  Telephone follow-up in 1 month  Please call the care guide team at [phone] if you need to cancel, schedule, or reschedule an appointment.   Please call the Suicide and Crisis Lifeline: 988 call the [place]  National Suicide Prevention Lifeline: 727-136-5758 or TTY: 321-519-7023 TTY 352-160-6378) to talk to a trained counselor call 1-800-273-TALK (toll free, 24 hour hotline) go to [facility] [address] [place] [phone]) call the [facility] Crisis Line: ([phone] call 911 if you are experiencing a Mental Health or Behavioral Health Crisis or need someone to talk to.  SIGNATURE Fleeta Kunde, RN, BSN, Theatre manager [employer] [phone]

## 2023-10-25 ENCOUNTER — Other Ambulatory Visit (HOSPITAL_COMMUNITY): Payer: Self-pay

## 2023-10-25 ENCOUNTER — Other Ambulatory Visit: Payer: Self-pay | Admitting: Family Medicine

## 2023-10-25 DIAGNOSIS — J4520 Mild intermittent asthma, uncomplicated: Secondary | ICD-10-CM

## 2023-10-25 MED ORDER — ALBUTEROL SULFATE HFA 108 (90 BASE) MCG/ACT IN AERS
2.0000 | INHALATION_SPRAY | RESPIRATORY_TRACT | 2 refills | Status: AC | PRN
  Filled 2023-10-25: qty 6.7, 17d supply, fill #0
  Filled 2023-11-13: qty 6.7, 17d supply, fill #1
  Filled 2023-12-18: qty 6.7, 17d supply, fill #2

## 2023-10-26 ENCOUNTER — Other Ambulatory Visit (HOSPITAL_COMMUNITY): Payer: Self-pay

## 2023-10-31 ENCOUNTER — Encounter (INDEPENDENT_AMBULATORY_CARE_PROVIDER_SITE_OTHER)

## 2023-10-31 ENCOUNTER — Telehealth: Payer: Self-pay | Admitting: Pharmacist

## 2023-10-31 NOTE — Telephone Encounter (Signed)
   Second unsuccessful outreach to patient today (called x2).  Phone went straight to VM.  VM left encouraging return call to PharmD.  For patient to be an appropriate pill packaging candidate, we will need a reliable contact for pharmacy to assist.  Perhaps there is a caregiver, etc we can connect with.  Will reach out to scheduler and RN to assist with reconnecting with PharmD as able. Appreciate assistance and will continue to follow as able.   Ereka Brau Dattero Alhaji Mcneal, PharmD, BCACP, CPP Clinical Pharmacist, [facility]

## 2023-11-07 NOTE — Progress Notes (Signed)
 Patient no showed x2 This encounter was created in error - please disregard.

## 2023-11-13 ENCOUNTER — Other Ambulatory Visit (HOSPITAL_COMMUNITY): Payer: Self-pay

## 2023-11-19 ENCOUNTER — Telehealth: Admitting: *Deleted

## 2023-11-21 ENCOUNTER — Other Ambulatory Visit: Payer: Self-pay | Admitting: *Deleted

## 2023-11-21 NOTE — Patient Outreach (Addendum)
 Complex Care Management   Visit Note  11/21/2023  Name:  Laura Dalton MRN: [id number] DOB: [birth year]  Situation: Referral received for Complex Care Management related to Heart Failure and COPD I obtained verbal consent from Patient.  Visit completed with Patient And son  on the phone  She reports persistent fatigue, and her son expresses concern that she appears to have emotionally withdrawn despite patient stating she was not depressed. The patient is declining to attend in-person provider appointments, stating she does not wish to leave her home.  She has experienced an unintentional weight loss of approximately 15 pounds, attributed to a lack of appetite. Despite this, she is not interested in following up with her primary care provider regarding the issue. However, she is receptive to speaking with someone about palliative care options. Referral placed with [facility].   Background:   Past Medical History:  Diagnosis Date   Bursitis of hip    Diabetes mellitus without complication (HCC)    type II   Hyperlipidemia    Hypertension    Hypothyroidism    Low back pain    benign turmor back   Stroke (HCC)    Vitamin D  deficiency disease     Assessment: Patient Reported Symptoms:  Cognitive Cognitive Status: Alert and oriented to person, place, and time Cognitive/Intellectual Conditions Management [RPT]: None reported or documented in medical history or problem list   Health Maintenance Behaviors: None Healing Pattern: Average Health Facilitated by: Rest  Neurological Neurological Review of Symptoms: No symptoms reported    HEENT HEENT Symptoms Reported: No symptoms reported      Cardiovascular Cardiovascular Symptoms Reported: No symptoms reported Weight: 125 lb (56.7 kg) (patient reported)  Respiratory Respiratory Symptoms Reported: Productive cough    Endocrine Endocrine Symptoms Reported: No symptoms reported Is patient diabetic?: No Is patient checking blood  sugars at home?: No    Gastrointestinal Gastrointestinal Symptoms Reported: No symptoms reported   Nutrition Risk Screen (CP): Unintentional loss of 10 lbs or more in the past 2 months  Genitourinary Genitourinary Symptoms Reported: No symptoms reported    Integumentary Integumentary Symptoms Reported: No symptoms reported Skin Management Strategies: Routine screening  Musculoskeletal Musculoskelatal Symptoms Reviewed: Back pain Musculoskeletal Management Strategies: Medical device Falls in the past year?: Yes Number of falls in past year: 2 or more Was there an injury with Fall?: Yes Fall Risk Category Calculator: 3 Patient Fall Risk Level: High Fall Risk Patient at Risk for Falls Due to: History of fall(s), Impaired balance/gait Fall risk Follow up: Falls evaluation completed  Psychosocial Psychosocial Symptoms Reported: No symptoms reported   Major Change/Loss/Stressor/Fears (CP): Denies Quality of Family Relationships: helpful, involved, supportive Do you feel physically threatened by others?: No    11/21/2023    PHQ2-9 Depression Screening   Little interest or pleasure in doing things Not at all  Feeling down, depressed, or hopeless Not at all  PHQ-2 - Total Score 0  Trouble falling or staying asleep, or sleeping too much    Feeling tired or having little energy    Poor appetite or overeating     Feeling bad about yourself - or that you are a failure or have let yourself or your family down    Trouble concentrating on things, such as reading the newspaper or watching television    Moving or speaking so slowly that other people could have noticed.  Or the opposite - being so fidgety or restless that you have been moving around a lot more  than usual    Thoughts that you would be better off dead, or hurting yourself in some way    PHQ2-9 Total Score    If you checked off any problems, how difficult have these problems made it for you to do your work, take care of things at  home, or get along with other people    Depression Interventions/Treatment      There were no vitals filed for this visit.  Medications Reviewed Today     Reviewed by Bertrum Rosina HERO, RN (Registered Nurse) on 11/21/23 at 1315  Med List Status: <None>   Medication Order Taking? Sig Documenting Provider Last Dose Status Informant  albuterol  (VENTOLIN  HFA) 108 (90 Base) MCG/ACT inhaler [id number] Yes Inhale 2 puffs into the lungs every 4-6 hours as needed for wheezing or shortness of breath. Dettinger, Fonda LABOR, MD  Active   alendronate  (FOSAMAX ) 70 MG tablet [id number] Yes TAKE 1 TABLET BY MOUTH WEEKLY  WITH 8 OZ OF PLAIN WATER 30  MINUTES BEFORE FIRST FOOD, DRINK OR MEDS. STAY UPRIGHT FOR 30  MINS Dettinger, Fonda LABOR, MD  Active Self, Pharmacy Records, Multiple Informants, Care Giver  amitriptyline  (ELAVIL ) 50 MG tablet [id number] Yes Take 1 tablet (50 mg total) by mouth at bedtime.  Patient taking differently: Take 50 mg by mouth at bedtime as needed.   Dettinger, Fonda LABOR, MD  Active Self, Pharmacy Records, Multiple Informants, Care Giver  atorvastatin  (LIPITOR) 40 MG tablet [id number] Yes Take 1 tablet (40 mg total) by mouth daily. [place], [place], [place]  Active   calcium  carbonate (OSCAL) 1500 (600 Ca) MG TABS tablet [id number] Yes Take 1 tablet (1,500 mg total) by mouth 2 (two) times daily with a meal. Maree Bracken D, DO  Active Self, Pharmacy Records, Multiple Informants, Care Giver  carvedilol  (COREG ) 3.125 MG tablet [id number] Yes Take 1 tablet (3.125 mg total) by mouth 2 (two) times daily with a meal. [place], Harlene HERO, [place]  Active   clopidogrel  (PLAVIX ) 75 MG tablet [id number]  Take 1 tablet (75 mg total) by mouth daily.  Patient not taking: Reported on 11/21/2023   Dettinger, Fonda LABOR, MD  Active Self, Pharmacy Records, Multiple Informants, Care Giver  dapagliflozin  propanediol (FARXIGA ) 10 MG TABS tablet [id number]  Take 1 tablet (10 mg total) by mouth daily.  Patient not taking:  Reported on 11/21/2023   Glena Harlene HERO, FNP  Active   donepezil  (ARICEPT ) 10 MG tablet [id number]  Take 1 tablet (10 mg total) by mouth at bedtime.  Patient not taking: Reported on 11/21/2023   Dettinger, Fonda LABOR, MD  Consider Medication Status and Discontinue Self, Pharmacy Records, Multiple Informants, Care Giver           Med Note STEFFI, ALEXANDRIA   Wed Jul 04, 2023  1:52 PM)    donepezil  (ARICEPT ) 10 MG tablet [id number] Yes Take 1 tablet (10 mg total) by mouth at bedtime. Dettinger, Fonda LABOR, MD  Active   DULoxetine  (CYMBALTA ) 60 MG capsule [id number] Yes Take 1 capsule (60 mg total) by mouth daily. Dettinger, Fonda LABOR, MD  Active Self, Pharmacy Records, Multiple Informants, Care Giver           Med Note CARLEEN, Amagon D   Wed Jul 04, 2023  7:37 AM)    feeding supplement (ENSURE ENLIVE / ENSURE PLUS) LIQD [id number] Yes Take 237 mLs by mouth 2 (two) times daily between meals. Gonfa, Taye T, MD  Active   levothyroxine  (SYNTHROID ) 125 MCG tablet [id number]  Yes Take 1 tablet (125 mcg total) by mouth daily before breakfast. Dettinger, Fonda LABOR, MD  Active Self, Pharmacy Records, Multiple Informants, Care Giver  losartan  (COZAAR ) 25 MG tablet [id number] Yes Take 1 tablet (25 mg total) by mouth daily. Dettinger, Fonda LABOR, MD  Active   spironolactone  (ALDACTONE ) 25 MG tablet [id number]  Take 0.5 tablets (12.5 mg total) by mouth daily.  Patient not taking: Reported on 10/19/2023   Glena Harlene HERO, FNP  Active   triamcinolone  cream (KENALOG ) 0.1 % [id number] Yes Apply 1 application topically 2 (two) times daily.  Patient taking differently: Apply 1 application  topically 2 (two) times daily as needed (Dermatitis).   Dettinger, Fonda LABOR, MD  Active Self, Pharmacy Records, Multiple Informants, Care Giver  umeclidinium-vilanterol (ANORO ELLIPTA ) 62.5-25 MCG/ACT AEPB [id number] Yes Inhale 1 puff into the lungs daily. Gonfa, Taye T, MD  Active             Recommendation:   Referral to:  Palliative Care  Follow Up Plan:   Telephone follow-up in 1 month  Rosina Forte, BSN RN [facility], [facility] RN Care Manager Direct Dial: [phone]  Fax: [phone]

## 2023-11-21 NOTE — Patient Instructions (Signed)
 Visit Information  Thank you for taking time to visit with me today. Please don't hesitate to contact me if I can be of assistance to you before our next scheduled appointment.  Your next care management appointment is by telephone on 12-21-2023 at 1:00 pm  Telephone follow-up in 1 month  Please call the care guide team at [phone] if you need to cancel, schedule, or reschedule an appointment.   Please call the Suicide and Crisis Lifeline: 988 call the [place]  National Suicide Prevention Lifeline: 548-625-9753 or TTY: (925)514-3528 TTY 213-433-1230) to talk to a trained counselor call 1-800-273-TALK (toll free, 24 hour hotline) if you are experiencing a Mental Health or Behavioral Health Crisis or need someone to talk to.  Rosina Forte, BSN RN [facility], [facility] RN Care Manager Direct Dial: [phone]  Fax: [phone]

## 2023-11-26 ENCOUNTER — Ambulatory Visit: Admitting: Nurse Practitioner

## 2023-11-26 ENCOUNTER — Other Ambulatory Visit (HOSPITAL_COMMUNITY): Payer: Self-pay

## 2023-11-26 ENCOUNTER — Other Ambulatory Visit: Payer: Self-pay | Admitting: Family Medicine

## 2023-11-26 DIAGNOSIS — Z79899 Other long term (current) drug therapy: Secondary | ICD-10-CM | POA: Diagnosis not present

## 2023-11-26 DIAGNOSIS — Z87891 Personal history of nicotine dependence: Secondary | ICD-10-CM | POA: Diagnosis not present

## 2023-11-26 DIAGNOSIS — J449 Chronic obstructive pulmonary disease, unspecified: Secondary | ICD-10-CM | POA: Diagnosis not present

## 2023-11-26 DIAGNOSIS — S32010A Wedge compression fracture of first lumbar vertebra, initial encounter for closed fracture: Secondary | ICD-10-CM

## 2023-11-26 DIAGNOSIS — E039 Hypothyroidism, unspecified: Secondary | ICD-10-CM | POA: Diagnosis not present

## 2023-11-26 MED ORDER — ALENDRONATE SODIUM 70 MG PO TABS
70.0000 mg | ORAL_TABLET | ORAL | 0 refills | Status: AC
Start: 2023-11-26 — End: ?
  Filled 2023-11-26: qty 4, 28d supply, fill #0

## 2023-12-06 ENCOUNTER — Other Ambulatory Visit

## 2023-12-12 ENCOUNTER — Other Ambulatory Visit (INDEPENDENT_AMBULATORY_CARE_PROVIDER_SITE_OTHER)

## 2023-12-12 DIAGNOSIS — E785 Hyperlipidemia, unspecified: Secondary | ICD-10-CM

## 2023-12-12 DIAGNOSIS — I10 Essential (primary) hypertension: Secondary | ICD-10-CM

## 2023-12-12 NOTE — Progress Notes (Signed)
 12/12/2023 Name: Luw[place] Dianne Linden MRN: [id number] DOB: 05/25/1950  Chief Complaint  Patient presents with   Medication Management    Paticia Moster is a 73 y.o. year old female who presented for a telephone visit.   They were referred to the pharmacist by the [facility] Complex Case Management program for assistance in managing complex medication management.    Subjective:  Care Team: Primary Care Provider: Dettinger, Fonda LABOR, MD ; Next Scheduled Visit: needs f/u appt   Medication Access/Adherence  Current Pharmacy:  [facility] [address] [place] [address] Phone: [phone] Fax: [phone]  [facility] [address] [place] [address] Phone: [phone] Fax: [phone]  [facility] - [address] [place] [address] Phone: [phone] Fax: [phone]  [facility] [address] [place] [address] [place] [address] Phone: [phone] Fax: [phone]   Patient reports affordability concerns with their medications: No  Patient reports access/transportation concerns to their pharmacy: Yes  Patient reports adherence concerns with their medications:  Yes  lack of transportation/memory loss   Medication Management:  Current adherence strategy: mail order from [facility]  Patient reports Poor adherence to medications  Patient reports the following barriers to adherence: lack of transportation to f/u appts; doesn't have refills on medications  Recent fill dates:  Albuterol  inhalers 11/14/23 Alendronate  11/27/23 28DS Amitriptyline  10/22/23 30DS Atorvastatin  10/22/23 90ds Carvedilol  10/22/23 90ds Clopidogrel  10/22/23 70ds Dapagliflozin  07/10/23 30ds Donepezil  10/22/23 60ds Duloxetine  10/22/23 90ds Levothyroxine  10/22/23 70ds Losartan  10/22/23 90ds Spironolactone  09/07/23 90ds Anoro Ellipta  4/292/25  30ds  Objective:  Lab Results  Component Value Date   HGBA1C 5.3 07/03/2023    Lab Results  Component Value Date   CREATININE 0.71 08/10/2023   BUN 8 08/10/2023   NA 142 08/10/2023   K 4.1 08/10/2023   CL 104 08/10/2023   CO2 20 08/10/2023    Lab Results  Component Value Date   CHOL 148 10/23/2022   HDL 58 10/23/2022   LDLCALC 71 10/23/2022   TRIG 104 10/23/2022   CHOLHDL 2.6 10/23/2022    Medications Reviewed Today     Reviewed by Billee Mliss BIRCH, RPH (Pharmacist) on 12/12/23 at 1332  Med List Status: <None>   Medication Order Taking? Sig Documenting Provider Last Dose Status Informant  albuterol  (VENTOLIN  HFA) 108 (90 Base) MCG/ACT inhaler [id number]  Inhale 2 puffs into the lungs every 4-6 hours as needed for wheezing or shortness of breath. Dettinger, Fonda LABOR, MD  Active   alendronate  (FOSAMAX ) 70 MG tablet [id number]  Take 1 tablet (70 mg total) by mouth once a week. With 8 oz of plain water 30 mins before first food, drink or meds Stay upright for 30 mins **NEEDS TO BE SEEN BEFORE NEXT REFILL** Dettinger, Fonda LABOR, MD  Active   amitriptyline  (ELAVIL ) 50 MG tablet [id number]  Take 1 tablet (50 mg total) by mouth at bedtime.  Patient taking differently: Take 50 mg by mouth at bedtime as needed.   Dettinger, Fonda LABOR, MD  Active Self, Pharmacy Records, Multiple Informants, Care Giver  atorvastatin  (LIPITOR) 40 MG tablet [id number]  Take 1 tablet (40 mg total) by mouth daily. [place], [place], [place]  Active   calcium  carbonate (OSCAL) 1500 (600 Ca) MG TABS tablet [id number]  Take 1 tablet (1,500 mg total) by mouth 2 (two) times daily with a meal. Maree, Pratik  D, DO  Active Self, Pharmacy Records, Multiple Informants, Care Giver  carvedilol  (COREG ) 3.125 MG tablet [id number]  Take 1 tablet (3.125 mg total) by mouth 2 (two) times daily with a meal. [place], Harlene HERO, [place]  Active   clopidogrel  (PLAVIX ) 75 MG tablet [id number]  Take 1 tablet (75 mg total) by mouth daily.  Patient  not taking: Reported on 11/21/2023   Dettinger, Fonda LABOR, MD  Active Self, Pharmacy Records, Multiple Informants, Care Giver  dapagliflozin  propanediol (FARXIGA ) 10 MG TABS tablet [id number]  Take 1 tablet (10 mg total) by mouth daily.  Patient not taking: Reported on 11/21/2023   Glena Harlene HERO, FNP  Active   donepezil  (ARICEPT ) 10 MG tablet [id number]  Take 1 tablet (10 mg total) by mouth at bedtime.  Patient not taking: Reported on 11/21/2023   Dettinger, Fonda LABOR, MD  Active Self, Pharmacy Records, Multiple Informants, Care Giver           Med Note STEFFI, ALEXANDRIA   Wed Jul 04, 2023  1:52 PM)    donepezil  (ARICEPT ) 10 MG tablet [id number]  Take 1 tablet (10 mg total) by mouth at bedtime. Dettinger, Fonda LABOR, MD  Active   DULoxetine  (CYMBALTA ) 60 MG capsule [id number]  Take 1 capsule (60 mg total) by mouth daily. Dettinger, Fonda LABOR, MD  Active Self, Pharmacy Records, Multiple Informants, Care Giver           Med Note CARLEEN, [place] D   Wed Jul 04, 2023  7:37 AM)    feeding supplement (ENSURE ENLIVE / ENSURE PLUS) LIQD [id number]  Take 237 mLs by mouth 2 (two) times daily between meals. Gonfa, Taye T, MD  Active   levothyroxine  (SYNTHROID ) 125 MCG tablet [id number]  Take 1 tablet (125 mcg total) by mouth daily before breakfast. Dettinger, Fonda LABOR, MD  Active Self, Pharmacy Records, Multiple Informants, Care Giver  losartan  (COZAAR ) 25 MG tablet [id number]  Take 1 tablet (25 mg total) by mouth daily. Dettinger, Fonda LABOR, MD  Active   spironolactone  (ALDACTONE ) 25 MG tablet [id number]  Take 0.5 tablets (12.5 mg total) by mouth daily.  Patient not taking: Reported on 10/19/2023   Glena Harlene HERO, FNP  Active   triamcinolone  cream (KENALOG ) 0.1 % [id number]  Apply 1 application topically 2 (two) times daily.  Patient taking differently: Apply 1 application  topically 2 (two) times daily as needed (Dermatitis).   Dettinger, Fonda LABOR, MD  Active Self, Pharmacy Records, Multiple Informants,  Care Giver  umeclidinium-vilanterol (ANORO ELLIPTA ) 62.5-25 MCG/ACT AEPB [id number]  Inhale 1 puff into the lungs daily. Gonfa, Taye T, MD  Active             SDOH Interventions Today    Flowsheet Row Most Recent Value  SDOH Interventions   Transportation Interventions AMB Referral  [message sent to RN]    Assessment/Plan:   Medication Management: - Currently strategy insufficient to maintain appropriate adherence to prescribed medication regimen - Suggested use of compliance packaging to organize medications via [facility] outpatient pharmacy anthon order - Created list of medication, indication, and administration time. Provided to patient -Message sent to RN to assist with transportation to appts to seek refills & have f/u assessment -patient not taking spironolactone , Farxiga , donepezil , clopidogrel --will attempt to seek refills, but patient will need to be scheduled for follow up and reassessment    Follow Up Plan: 3 weeks  Mliss Tarry Griffin, PharmD, BCACP, CPP Clinical Pharmacist, [facility]

## 2023-12-13 ENCOUNTER — Telehealth: Payer: Self-pay

## 2023-12-13 DIAGNOSIS — M4714 Other spondylosis with myelopathy, thoracic region: Secondary | ICD-10-CM

## 2023-12-13 DIAGNOSIS — I5022 Chronic systolic (congestive) heart failure: Secondary | ICD-10-CM

## 2023-12-13 DIAGNOSIS — E1142 Type 2 diabetes mellitus with diabetic polyneuropathy: Secondary | ICD-10-CM

## 2023-12-13 DIAGNOSIS — R413 Other amnesia: Secondary | ICD-10-CM

## 2023-12-13 DIAGNOSIS — M479 Spondylosis, unspecified: Secondary | ICD-10-CM

## 2023-12-13 NOTE — Telephone Encounter (Signed)
 Please review

## 2023-12-13 NOTE — Telephone Encounter (Signed)
 Copied from CRM [id number]. Topic: Clinical - Medical Advice >> Dec 13, 2023 12:44 PM Carlatta H wrote: Reason for CRM: Order for hospice evaluation please call [phone]//Will also fax request

## 2023-12-14 ENCOUNTER — Other Ambulatory Visit: Payer: Self-pay | Admitting: Family Medicine

## 2023-12-14 ENCOUNTER — Other Ambulatory Visit: Payer: Self-pay

## 2023-12-14 ENCOUNTER — Other Ambulatory Visit (HOSPITAL_COMMUNITY): Payer: Self-pay

## 2023-12-14 ENCOUNTER — Other Ambulatory Visit (HOSPITAL_COMMUNITY): Payer: Self-pay | Admitting: Family Medicine

## 2023-12-14 MED ORDER — DULOXETINE HCL 60 MG PO CPEP
60.0000 mg | ORAL_CAPSULE | Freq: Every day | ORAL | 1 refills | Status: AC
  Filled 2023-12-14 – 2023-12-18 (×2): qty 100, 100d supply, fill #0
  Filled ????-??-??: fill #0

## 2023-12-14 MED ORDER — DONEPEZIL HCL 10 MG PO TABS
10.0000 mg | ORAL_TABLET | Freq: Every day | ORAL | 1 refills | Status: AC
  Filled 2023-12-14 – 2023-12-18 (×2): qty 90, 90d supply, fill #0

## 2023-12-14 MED ORDER — LEVOTHYROXINE SODIUM 125 MCG PO TABS
125.0000 ug | ORAL_TABLET | Freq: Every day | ORAL | 1 refills | Status: AC
  Filled 2023-12-14 – 2023-12-18 (×2): qty 100, 100d supply, fill #0

## 2023-12-14 MED ORDER — CLOPIDOGREL BISULFATE 75 MG PO TABS
75.0000 mg | ORAL_TABLET | Freq: Every day | ORAL | 1 refills | Status: AC
  Filled 2023-12-14 – 2023-12-18 (×2): qty 100, 100d supply, fill #0

## 2023-12-14 MED ORDER — CARVEDILOL 3.125 MG PO TABS
3.1250 mg | ORAL_TABLET | Freq: Two times a day (BID) | ORAL | 0 refills | Status: AC
  Filled 2023-12-14: qty 60, 30d supply, fill #0

## 2023-12-14 MED FILL — Amitriptyline HCl Tab 50 MG: ORAL | 30 days supply | Qty: 30 | Fill #1 | Status: CN

## 2023-12-14 NOTE — Telephone Encounter (Signed)
 Placed hospice orders for patient

## 2023-12-14 NOTE — Addendum Note (Signed)
 Addended by: MARYANNE CHEW on: 12/14/2023 08:03 AM   Modules accepted: Orders

## 2023-12-15 ENCOUNTER — Other Ambulatory Visit (HOSPITAL_COMMUNITY): Payer: Self-pay

## 2023-12-17 ENCOUNTER — Other Ambulatory Visit (HOSPITAL_COMMUNITY): Payer: Self-pay

## 2023-12-17 ENCOUNTER — Encounter: Payer: Self-pay | Admitting: Cardiology

## 2023-12-17 ENCOUNTER — Other Ambulatory Visit: Payer: Self-pay

## 2023-12-18 ENCOUNTER — Other Ambulatory Visit: Payer: Self-pay | Admitting: *Deleted

## 2023-12-18 ENCOUNTER — Other Ambulatory Visit: Payer: Self-pay

## 2023-12-18 ENCOUNTER — Other Ambulatory Visit: Payer: Self-pay | Admitting: Family Medicine

## 2023-12-18 ENCOUNTER — Other Ambulatory Visit (HOSPITAL_COMMUNITY): Payer: Self-pay

## 2023-12-18 DIAGNOSIS — S32010A Wedge compression fracture of first lumbar vertebra, initial encounter for closed fracture: Secondary | ICD-10-CM

## 2023-12-18 MED FILL — Amitriptyline HCl Tab 50 MG: ORAL | 30 days supply | Qty: 30 | Fill #1 | Status: CN

## 2023-12-20 ENCOUNTER — Telehealth: Payer: Self-pay | Admitting: Nurse Practitioner

## 2023-12-20 NOTE — Telephone Encounter (Signed)
  Patient Consent for Virtual Visit        Laura Dalton has provided verbal consent on 12/20/2023 for a virtual visit (video or telephone).   CONSENT FOR VIRTUAL VISIT FOR:  Laura Dalton  By participating in this virtual visit I agree to the following:  I hereby voluntarily request, consent and authorize [facility] and its employed or contracted physicians, physician assistants, nurse practitioners or other licensed health care professionals (the Practitioner), to provide me with telemedicine health care services (the "Services) as deemed necessary by the treating Practitioner. I acknowledge and consent to receive the Services by the Practitioner via telemedicine. I understand that the telemedicine visit will involve communicating with the Practitioner through live audiovisual communication technology and the disclosure of certain medical information by electronic transmission. I acknowledge that I have been given the opportunity to request an in-person assessment or other available alternative prior to the telemedicine visit and am voluntarily participating in the telemedicine visit.  I understand that I have the right to withhold or withdraw my consent to the use of telemedicine in the course of my care at any time, without affecting my right to future care or treatment, and that the Practitioner or I may terminate the telemedicine visit at any time. I understand that I have the right to inspect all information obtained and/or recorded in the course of the telemedicine visit and may receive copies of available information for a reasonable fee.  I understand that some of the potential risks of receiving the Services via telemedicine include:  Delay or interruption in medical evaluation due to technological equipment failure or disruption; Information transmitted may not be sufficient (e.g. poor resolution of images) to allow for appropriate medical decision making by the  Practitioner; and/or  In rare instances, security protocols could fail, causing a breach of personal health information.  Furthermore, I acknowledge that it is my responsibility to provide information about my medical history, conditions and care that is complete and accurate to the best of my ability. I acknowledge that Practitioner's advice, recommendations, and/or decision may be based on factors not within their control, such as incomplete or inaccurate data provided by me or distortions of diagnostic images or specimens that may result from electronic transmissions. I understand that the practice of medicine is not an exact science and that Practitioner makes no warranties or guarantees regarding treatment outcomes. I acknowledge that a copy of this consent can be made available to me via my patient portal [facility] MyChart), or I can request a printed copy by calling the office of [facility].    I understand that my insurance will be billed for this visit.   I have read or had this consent read to me. I understand the contents of this consent, which adequately explains the benefits and risks of the Services being provided via telemedicine.  I have been provided ample opportunity to ask questions regarding this consent and the Services and have had my questions answered to my satisfaction. I give my informed consent for the services to be provided through the use of telemedicine in my medical care

## 2023-12-21 ENCOUNTER — Other Ambulatory Visit: Payer: Self-pay | Admitting: *Deleted

## 2023-12-21 NOTE — Patient Instructions (Signed)
 Visit Information  Thank you for taking time to visit with me today. Please don't hesitate to contact me if I can be of assistance to you before our next scheduled appointment.  Your next care management appointment is no further scheduled appointments.    Closing From: Complex Care Management. Patient has met all care management goals. Care Management case will be closed. Patient has been provided contact information should new needs arise.   Please call the care guide team at [phone] if you need to cancel, schedule, or reschedule an appointment.   Please call the Suicide and Crisis Lifeline: 988 call the [place]  National Suicide Prevention Lifeline: 930-265-7590 or TTY: 540-771-0638 TTY 563-023-0381) to talk to a trained counselor call 1-800-273-TALK (toll free, 24 hour hotline) if you are experiencing a Mental Health or Behavioral Health Crisis or need someone to talk to.  Rosina Forte, BSN RN [facility], [facility] RN Care Manager Direct Dial: [phone]  Fax: [phone]

## 2023-12-21 NOTE — Patient Outreach (Signed)
 Complex Care Management   Visit Note  12/21/2023  Name:  Laura Dalton MRN: [id number] DOB: [birth year]  Situation: Referral received for Complex Care Management related to COPD I obtained verbal consent from Patient.  Visit completed with Caregiver  on the phone. Per daughter, Leotis patient has been transitioned over to hospice under the care of [facility] and no further outreaches are needed at this time.  Background:   Past Medical History:  Diagnosis Date   Bursitis of hip    Diabetes mellitus without complication (HCC)    type II   Hyperlipidemia    Hypertension    Hypothyroidism    Low back pain    benign turmor back   Stroke [facility])    Vitamin D  deficiency disease     Assessment: Patient Reported Symptoms:  Cognitive        Neurological      HEENT        Cardiovascular      Respiratory      Endocrine      Gastrointestinal        Genitourinary      Integumentary      Musculoskeletal          Psychosocial            12/21/2023    PHQ2-9 Depression Screening   Little interest or pleasure in doing things    Feeling down, depressed, or hopeless    PHQ-2 - Total Score    Trouble falling or staying asleep, or sleeping too much    Feeling tired or having little energy    Poor appetite or overeating     Feeling bad about yourself - or that you are a failure or have let yourself or your family down    Trouble concentrating on things, such as reading the newspaper or watching television    Moving or speaking so slowly that other people could have noticed.  Or the opposite - being so fidgety or restless that you have been moving around a lot more than usual    Thoughts that you would be better off dead, or hurting yourself in some way    PHQ2-9 Total Score    If you checked off any problems, how difficult have these problems made it for you to do your work, take care of things at home, or get along with other people    Depression  Interventions/Treatment      There were no vitals filed for this visit.  Medications Reviewed Today   Medications were not reviewed in this encounter     Recommendation:   Continue Current Plan of Care  Follow Up Plan:   Closing From:  Complex Care Management Patient has met all care management goals. Care Management case will be closed. Patient has been provided contact information should new needs arise.   Rosina Forte, BSN RN [facility], [facility] RN Care Manager Direct Dial: [phone]  Fax: [phone]

## 2024-01-04 ENCOUNTER — Other Ambulatory Visit

## 2024-01-21 NOTE — Progress Notes (Signed)
 Pharmacy Quality Measure Review  This patient is appearing on a report for being at risk of failing the adherence measure for hypertension (ACEi/ARB) medications this calendar year.   Medication: losartan  25 mg Last fill date: 01/14/24 for 30 day supply  Insurance report was not up to date. No action needed at this time.   Woodie Jock, PharmD PGY1 Pharmacy Resident  01/21/2024

## 2024-02-11 ENCOUNTER — Other Ambulatory Visit: Payer: Self-pay | Admitting: Family Medicine

## 2024-02-11 DIAGNOSIS — E1142 Type 2 diabetes mellitus with diabetic polyneuropathy: Secondary | ICD-10-CM

## 2024-03-07 ENCOUNTER — Encounter: Payer: Self-pay | Admitting: Nurse Practitioner

## 2024-03-07 ENCOUNTER — Telehealth: Payer: Self-pay

## 2024-03-07 ENCOUNTER — Ambulatory Visit: Attending: Nurse Practitioner | Admitting: Nurse Practitioner

## 2024-03-07 VITALS — Ht 66.0 in | Wt 110.0 lb

## 2024-03-07 DIAGNOSIS — Z794 Long term (current) use of insulin: Secondary | ICD-10-CM

## 2024-03-07 DIAGNOSIS — E119 Type 2 diabetes mellitus without complications: Secondary | ICD-10-CM

## 2024-03-07 DIAGNOSIS — J449 Chronic obstructive pulmonary disease, unspecified: Secondary | ICD-10-CM

## 2024-03-07 DIAGNOSIS — I5020 Unspecified systolic (congestive) heart failure: Secondary | ICD-10-CM

## 2024-03-07 NOTE — Progress Notes (Signed)
 "   Virtual Visit via Telephone Note   Because of Laura Dalton's co-morbid illnesses, she is at least at moderate risk for complications without adequate follow up.  This format is felt to be most appropriate for this patient at this time.  The patient did not have access to video technology/had technical difficulties with video requiring transitioning to audio format only (telephone).  All issues noted in this document were discussed and addressed.  No physical exam could be performed with this format.  Please refer to the patient's chart for her consent to telehealth for [facility].    Date:  03/07/2024 ID:  Laura Dalton, DOB 06-02-1950, MRN [id number] The patient was identified using 2 identifiers. Patient Location: Home Provider Location: Office/Clinic PCP:  Dettinger, Fonda LABOR, MD [facility] Providers Cardiologist:  Alvan Carrier, MD     Evaluation Performed:  Follow-Up Visit  Chief Complaint:  Here for follow-up   History of Present Illness:    Laura Dalton is a 73 y.o. female with a PMH of HFrEF, T2DM, hypothyroidism, COPD, who presents today for CHF follow-up.   Hospitalized 06/2023 with new onset HFrEF, CAP. Tx with IV Lasix , nebs, and ABX. Echo revealed EF 25%, findings suggestive of Takotsubo cardiomyopathy.  Underwent right and left heart cath that showed normal coronary arteries, normal filling pressures, preserved CI.  Seen by heart failure clinic on Jul 17, 2023 for posthospital heart failure follow-up.  She was feeling fine.  Weight was stable.  GDMT limited at the time due to her history of falls and dizziness.  Recommended repeating echocardiogram in 3 months.  08/31/2023 - Today she presents for scheduled follow-up.  She states she is doing well. Continues to note hx of falls. Denies any acute injuries or head injuries. Denies any chest pain, shortness of breath, palpitations, syncope, presyncope, dizziness, orthopnea, PND, swelling or  significant weight changes, acute bleeding, or claudication.  03/07/2024 - Here for telephone follow-up. Pt is now with Hospice. Doing well and pt denies any acute cardiac complaints or issues. Denies any chest pain, shortness of breath, palpitations, syncope, presyncope, dizziness, orthopnea, PND, swelling or significant weight changes, acute bleeding, or claudication.  ROS:   Please see the history of present illness.    All other systems reviewed and are negative.   Prior CV studies:   The following studies were reviewed today:  EKG: EKG is not ordered today.  Right/left heart cath 06/2023:  HEMODYNAMICS: RA:       4 mmHg (mean) RV:       30/2, 4 mmHg PA:       30/16 mmHg (20 mean) PCWP: 12 mmHg (mean)                                      Estimated Fick CO/CI   5.03L/min, 2.84L/min/m2                                            TPG  8  mmHg  PVR  1.6 Wood Units  PAPi  3.5       IMPRESSION: Left and right heart catheterization for evaluation of new systolic heart failure. Normal right dominant coronary arteriography. 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 Continue medical therapy  Echo 06/2023:  1. Left ventricular ejection fraction, by estimation, is 25%. The left  ventricle has severely decreased function. The left ventricle demonstrates  regional wall motion abnormalities with normal function of the LV basal  segments and akinesis of the LV mid   to apical segments. This suggests either a stress (Takotsubo-type)  cardiomyopathy or severe coronary disease. There is mild concentric left  ventricular hypertrophy. Left ventricular diastolic parameters are  indeterminate.   2. Right ventricular systolic function is normal. The right ventricular  size is normal. Tricuspid  regurgitation signal is inadequate for assessing  PA pressure.   3. The mitral valve is normal in structure. No evidence of mitral valve  regurgitation. No evidence of mitral stenosis.   4. The aortic valve is tricuspid. There is mild calcification of the  aortic valve. Aortic valve regurgitation is not visualized. No aortic  stenosis is present.   5. The inferior vena cava is normal in size with greater than 50%  respiratory variability, suggesting right atrial pressure of 3 mmHg.   6. A small pericardial effusion is present.  Carotid duplex 03/2021:  Summary:  Right Carotid: Velocities in the right ICA are consistent with a 1-39%  stenosis.   Left Carotid: Velocities in the left ICA are consistent with a 1-39%  stenosis.   Vertebrals: Bilateral vertebral arteries demonstrate antegrade flow.  Subclavians: Normal flow hemodynamics were seen in bilateral subclavian               arteries.   *See table(s) above for measurements and observations.  Labs/Other Tests and Data Reviewed:     Recent Labs: 07/10/2023: Magnesium  2.2 07/17/2023: B Natriuretic Peptide 152.0 08/10/2023: ALT 8; BUN 8; Creatinine, Ser 0.71; Hemoglobin 13.8; Platelets 376; Potassium 4.1; Sodium 142   Recent Lipid Panel Lab Results  Component Value Date/Time   CHOL 148 10/23/2022 04:01 PM   CHOL 142 10/01/2012 01:08 PM   TRIG 104 10/23/2022 04:01 PM   TRIG 293 (H) 01/06/2013 01:16 PM   TRIG 156 (H) 10/01/2012 01:08 PM   HDL 58 10/23/2022 04:01 PM   HDL 45 01/06/2013 01:16 PM   HDL 38 (L) 10/01/2012 01:08 PM   CHOLHDL 2.6 10/23/2022 04:01 PM   CHOLHDL 2.5 08/02/2015 04:55 AM   LDLCALC 71 10/23/2022 04:01 PM   LDLCALC 111 (H) 01/06/2013 01:16 PM   LDLCALC 73 10/01/2012 01:08 PM    Wt Readings from Last 3 Encounters:  03/07/24 110 lb (49.9 kg)  11/21/23 125 lb (56.7 kg)  09/07/23 140 lb (63.5 kg)     Objective:    Vital Signs:  Ht 5' 6 (1.676 m)   Wt 110 lb (49.9 kg)   BMI 17.75 kg/m    Due to  nature of today's visit, a physical exam was unable to be performed.   ASSESSMENT & PLAN:    HFrEF Stage C, D. NYHA class I symptoms. EF 25%, NICM. 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  Continue Coreg , Farxiga , losartan , and spironolactone .  GDMT limited due to her history of falls/dizziness.  Now with Hospice care as outlined above. Low sodium diet, fluid restriction <2L, and daily weights  encouraged. Educated to contact our office for weight gain of 2 lbs overnight or 5 lbs in one week. Care and ED precautions discussed.  T2DM PCP and Hospice Care to manage.    COPD Denies any worsening symptoms. No medication changes at this time. Continue to follow with PCP and Hospice.    Time:   Today, I have spent 4 minutes with the patient with telehealth technology discussing the above problems.    I spent a total duration of 10 minutes reviewing prior notes, reviewing outside records including  labs, telephone counseling of medical condition, pathophysiology, evaluation, management, and documenting the findings in the note.    Medication Adjustments/Labs and Tests Ordered: Current medicines are reviewed at length with the patient today.  Concerns regarding medicines are outlined above.   Tests Ordered: No orders of the defined types were placed in this encounter.   Medication Changes: No orders of the defined types were placed in this encounter.   Follow Up:  Virtual Visit  in 6 month(s)  Signed, Almarie Crate, NP  "

## 2024-03-07 NOTE — Patient Instructions (Signed)
 Medication Instructions:  Your physician recommends that you continue on your current medications as directed. Please refer to the Current Medication list given to you today.  *If you need a refill on your cardiac medications before your next appointment, please call your pharmacy*  Lab Work: None If you have labs (blood work) drawn today and your tests are completely normal, you will receive your results only by: MyChart Message (if you have MyChart) OR A paper copy in the mail If you have any lab test that is abnormal or we need to change your treatment, we will call you to review the results.  Testing/Procedures: None  Follow-Up: At [facility], you and your health needs are our priority.  As part of our continuing mission to provide you with exceptional heart care, our providers are all part of one team.  This team includes your primary Cardiologist (physician) and Advanced Practice Providers or APPs (Physician Assistants and Nurse Practitioners) who all work together to provide you with the care you need, when you need it.  Your next appointment:   6 month(s)  Provider:   Almarie Crate, NP  We recommend signing up for the patient portal called MyChart.  Sign up information is provided on this After Visit Summary.  MyChart is used to connect with patients for Virtual Visits (Telemedicine).  Patients are able to view lab/test results, encounter notes, upcoming appointments, etc.  Non-urgent messages can be sent to your provider as well.   To learn more about what you can do with MyChart, go to [email or web address].   Other Instructions Thank you for choosing [facility]!

## 2024-03-07 NOTE — Telephone Encounter (Signed)
"  °  Patient Consent for Virtual Visit   Laura Dalton has provided verbal consent on 03/07/2024 for a virtual visit (video or telephone).   CONSENT FOR VIRTUAL VISIT FOR:  Laura Dalton  By participating in this virtual visit I agree to the following:  I hereby voluntarily request, consent and authorize [facility] and its employed or contracted physicians, physician assistants, nurse practitioners or other licensed health care professionals (the Practitioner), to provide me with telemedicine health care services (the Services) as deemed necessary by the treating Practitioner. I acknowledge and consent to receive the Services by the Practitioner via telemedicine. I understand that the telemedicine visit will involve communicating with the Practitioner through live audiovisual communication technology and the disclosure of certain medical information by electronic transmission. I acknowledge that I have been given the opportunity to request an in-person assessment or other available alternative prior to the telemedicine visit and am voluntarily participating in the telemedicine visit.  I understand that I have the right to withhold or withdraw my consent to the use of telemedicine in the course of my care at any time, without affecting my right to future care or treatment, and that the Practitioner or I may terminate the telemedicine visit at any time. I understand that I have the right to inspect all information obtained and/or recorded in the course of the telemedicine visit and may receive copies of available information for a reasonable fee.  I understand that some of the potential risks of receiving the Services via telemedicine include:  Delay or interruption in medical evaluation due to technological equipment failure or disruption; Information transmitted may not be sufficient (e.g. poor resolution of images) to allow for appropriate medical decision making by the Practitioner;  and/or  In rare instances, security protocols could fail, causing a breach of personal health information.  Furthermore, I acknowledge that it is my responsibility to provide information about my medical history, conditions and care that is complete and accurate to the best of my ability. I acknowledge that Practitioner's advice, recommendations, and/or decision may be based on factors not within their control, such as incomplete or inaccurate data provided by me or distortions of diagnostic images or specimens that may result from electronic transmissions. I understand that the practice of medicine is not an exact science and that Practitioner makes no warranties or guarantees regarding treatment outcomes. I acknowledge that a copy of this consent can be made available to me via my patient portal [facility] MyChart), or I can request a printed copy by calling the office of [facility].    I understand that my insurance will be billed for this visit.   I have read or had this consent read to me. I understand the contents of this consent, which adequately explains the benefits and risks of the Services being provided via telemedicine.  I have been provided ample opportunity to ask questions regarding this consent and the Services and have had my questions answered to my satisfaction. I give my informed consent for the services to be provided through the use of telemedicine in my medical care    "

## 2024-09-01 ENCOUNTER — Ambulatory Visit: Admitting: Nurse Practitioner
